# Patient Record
Sex: Female | Born: 1935
Health system: Southern US, Community
[De-identification: ages and names within clinical notes are randomized; demographics above are authoritative.]

## PROBLEM LIST (undated history)

## (undated) DIAGNOSIS — K219 Gastro-esophageal reflux disease without esophagitis: Secondary | ICD-10-CM

## (undated) DIAGNOSIS — E785 Hyperlipidemia, unspecified: Secondary | ICD-10-CM

## (undated) DIAGNOSIS — H269 Unspecified cataract: Secondary | ICD-10-CM

## (undated) DIAGNOSIS — F32A Depression, unspecified: Secondary | ICD-10-CM

## (undated) DIAGNOSIS — F329 Major depressive disorder, single episode, unspecified: Secondary | ICD-10-CM

## (undated) DIAGNOSIS — I10 Essential (primary) hypertension: Secondary | ICD-10-CM

## (undated) DIAGNOSIS — R011 Cardiac murmur, unspecified: Secondary | ICD-10-CM

## (undated) DIAGNOSIS — I2699 Other pulmonary embolism without acute cor pulmonale: Secondary | ICD-10-CM

## (undated) DIAGNOSIS — I4891 Unspecified atrial fibrillation: Secondary | ICD-10-CM

## (undated) DIAGNOSIS — D751 Secondary polycythemia: Secondary | ICD-10-CM

## (undated) DIAGNOSIS — N390 Urinary tract infection, site not specified: Secondary | ICD-10-CM

## (undated) DIAGNOSIS — I459 Conduction disorder, unspecified: Secondary | ICD-10-CM

## (undated) DIAGNOSIS — D689 Coagulation defect, unspecified: Secondary | ICD-10-CM

## (undated) DIAGNOSIS — M1990 Unspecified osteoarthritis, unspecified site: Secondary | ICD-10-CM

## (undated) DIAGNOSIS — M810 Age-related osteoporosis without current pathological fracture: Secondary | ICD-10-CM

## (undated) DIAGNOSIS — I82409 Acute embolism and thrombosis of unspecified deep veins of unspecified lower extremity: Secondary | ICD-10-CM

## (undated) DIAGNOSIS — F0390 Unspecified dementia without behavioral disturbance: Secondary | ICD-10-CM

## (undated) DIAGNOSIS — E039 Hypothyroidism, unspecified: Secondary | ICD-10-CM

## (undated) DIAGNOSIS — K449 Diaphragmatic hernia without obstruction or gangrene: Secondary | ICD-10-CM

## (undated) HISTORY — DX: Diaphragmatic hernia without obstruction or gangrene: K44.9

## (undated) HISTORY — PX: INNER EAR SURGERY: SHX679

## (undated) HISTORY — DX: Major depressive disorder, single episode, unspecified: F32.9

## (undated) HISTORY — PX: HEMORRHOID SURGERY: SHX153

## (undated) HISTORY — DX: Other pulmonary embolism without acute cor pulmonale: I26.99

## (undated) HISTORY — DX: Unspecified cataract: H26.9

## (undated) HISTORY — PX: JOINT REPLACEMENT: SHX530

## (undated) HISTORY — DX: Gastro-esophageal reflux disease without esophagitis: K21.9

## (undated) HISTORY — DX: Urinary tract infection, site not specified: N39.0

## (undated) HISTORY — DX: Unspecified dementia, unspecified severity, without behavioral disturbance, psychotic disturbance, mood disturbance, and anxiety: F03.90

## (undated) HISTORY — DX: Essential (primary) hypertension: I10

## (undated) HISTORY — DX: Age-related osteoporosis without current pathological fracture: M81.0

## (undated) HISTORY — DX: Secondary polycythemia: D75.1

## (undated) HISTORY — PX: OTHER SURGICAL HISTORY: SHX169

## (undated) HISTORY — DX: Depression, unspecified: F32.A

## (undated) HISTORY — DX: Unspecified osteoarthritis, unspecified site: M19.90

## (undated) HISTORY — DX: Acute embolism and thrombosis of unspecified deep veins of unspecified lower extremity: I82.409

## (undated) HISTORY — PX: BLADDER SURGERY: SHX569

## (undated) HISTORY — PX: TOTAL KNEE ARTHROPLASTY: SHX125

## (undated) HISTORY — DX: Hyperlipidemia, unspecified: E78.5

## (undated) HISTORY — DX: Cardiac murmur, unspecified: R01.1

## (undated) HISTORY — DX: Unspecified atrial fibrillation: I48.91

## (undated) HISTORY — PX: COLONOSCOPY: SHX174

## (undated) HISTORY — DX: Conduction disorder, unspecified: I45.9

## (undated) HISTORY — DX: Hypothyroidism, unspecified: E03.9

## (undated) HISTORY — DX: Coagulation defect, unspecified: D68.9

## (undated) HISTORY — PX: ESOPHAGOGASTRODUODENOSCOPY: SHX1529

---

## 1997-09-30 ENCOUNTER — Other Ambulatory Visit: Admission: RE | Admit: 1997-09-30 | Discharge: 1997-09-30 | Payer: Self-pay | Admitting: Family Medicine

## 1997-10-04 ENCOUNTER — Other Ambulatory Visit: Admission: RE | Admit: 1997-10-04 | Discharge: 1997-10-04 | Payer: Self-pay | Admitting: Family Medicine

## 1997-12-02 ENCOUNTER — Other Ambulatory Visit: Admission: RE | Admit: 1997-12-02 | Discharge: 1997-12-02 | Payer: Self-pay | Admitting: Family Medicine

## 1999-02-14 ENCOUNTER — Other Ambulatory Visit: Admission: RE | Admit: 1999-02-14 | Discharge: 1999-02-14 | Payer: Self-pay | Admitting: Family Medicine

## 2000-02-15 ENCOUNTER — Other Ambulatory Visit: Admission: RE | Admit: 2000-02-15 | Discharge: 2000-02-15 | Payer: Self-pay | Admitting: Family Medicine

## 2002-07-02 ENCOUNTER — Other Ambulatory Visit: Admission: RE | Admit: 2002-07-02 | Discharge: 2002-07-02 | Payer: Self-pay | Admitting: Family Medicine

## 2002-07-07 ENCOUNTER — Encounter: Payer: Self-pay | Admitting: Family Medicine

## 2002-07-07 ENCOUNTER — Encounter: Admission: RE | Admit: 2002-07-07 | Discharge: 2002-07-07 | Payer: Self-pay | Admitting: Family Medicine

## 2002-07-13 ENCOUNTER — Ambulatory Visit (HOSPITAL_COMMUNITY): Admission: RE | Admit: 2002-07-13 | Discharge: 2002-07-13 | Payer: Self-pay | Admitting: Cardiology

## 2002-08-28 ENCOUNTER — Encounter: Payer: Self-pay | Admitting: Internal Medicine

## 2002-08-28 ENCOUNTER — Encounter: Admission: RE | Admit: 2002-08-28 | Discharge: 2002-08-28 | Payer: Self-pay | Admitting: Otolaryngology

## 2002-08-28 ENCOUNTER — Encounter: Payer: Self-pay | Admitting: Otolaryngology

## 2003-03-04 ENCOUNTER — Encounter: Admission: RE | Admit: 2003-03-04 | Discharge: 2003-03-04 | Payer: Self-pay | Admitting: Urology

## 2003-03-04 ENCOUNTER — Encounter: Payer: Self-pay | Admitting: Urology

## 2003-06-28 ENCOUNTER — Ambulatory Visit (HOSPITAL_COMMUNITY): Admission: RE | Admit: 2003-06-28 | Discharge: 2003-06-28 | Payer: Self-pay | Admitting: Urology

## 2004-06-21 ENCOUNTER — Inpatient Hospital Stay (HOSPITAL_COMMUNITY): Admission: RE | Admit: 2004-06-21 | Discharge: 2004-06-26 | Payer: Self-pay | Admitting: Orthopaedic Surgery

## 2004-06-26 ENCOUNTER — Ambulatory Visit: Payer: Self-pay | Admitting: Physical Medicine & Rehabilitation

## 2004-06-26 ENCOUNTER — Inpatient Hospital Stay
Admission: RE | Admit: 2004-06-26 | Discharge: 2004-06-30 | Payer: Self-pay | Admitting: Physical Medicine & Rehabilitation

## 2005-03-30 ENCOUNTER — Encounter: Admission: RE | Admit: 2005-03-30 | Discharge: 2005-03-30 | Payer: Self-pay | Admitting: Family Medicine

## 2006-04-01 ENCOUNTER — Ambulatory Visit: Payer: Self-pay | Admitting: Internal Medicine

## 2006-04-23 ENCOUNTER — Encounter (INDEPENDENT_AMBULATORY_CARE_PROVIDER_SITE_OTHER): Payer: Self-pay | Admitting: Specialist

## 2006-04-23 ENCOUNTER — Ambulatory Visit: Payer: Self-pay | Admitting: Internal Medicine

## 2007-07-31 ENCOUNTER — Encounter: Admission: RE | Admit: 2007-07-31 | Discharge: 2007-07-31 | Payer: Self-pay | Admitting: Cardiology

## 2007-09-24 ENCOUNTER — Encounter: Admission: RE | Admit: 2007-09-24 | Discharge: 2007-09-24 | Payer: Self-pay | Admitting: Family Medicine

## 2007-11-19 ENCOUNTER — Encounter: Admission: RE | Admit: 2007-11-19 | Discharge: 2008-02-05 | Payer: Self-pay | Admitting: Family Medicine

## 2008-08-16 ENCOUNTER — Encounter: Admission: RE | Admit: 2008-08-16 | Discharge: 2008-08-16 | Payer: Self-pay | Admitting: Neurology

## 2008-12-07 ENCOUNTER — Encounter: Admission: RE | Admit: 2008-12-07 | Discharge: 2008-12-07 | Payer: Self-pay | Admitting: Neurology

## 2008-12-14 ENCOUNTER — Ambulatory Visit: Payer: Self-pay | Admitting: Internal Medicine

## 2008-12-14 DIAGNOSIS — R1319 Other dysphagia: Secondary | ICD-10-CM | POA: Insufficient documentation

## 2008-12-16 ENCOUNTER — Ambulatory Visit (HOSPITAL_COMMUNITY): Admission: RE | Admit: 2008-12-16 | Discharge: 2008-12-16 | Payer: Self-pay | Admitting: Internal Medicine

## 2008-12-23 ENCOUNTER — Ambulatory Visit: Payer: Self-pay | Admitting: Internal Medicine

## 2008-12-30 ENCOUNTER — Ambulatory Visit: Payer: Self-pay | Admitting: Internal Medicine

## 2009-01-21 ENCOUNTER — Ambulatory Visit (HOSPITAL_COMMUNITY): Admission: RE | Admit: 2009-01-21 | Discharge: 2009-01-21 | Payer: Self-pay | Admitting: Neurology

## 2009-05-14 DIAGNOSIS — I2699 Other pulmonary embolism without acute cor pulmonale: Secondary | ICD-10-CM

## 2009-05-14 DIAGNOSIS — I82409 Acute embolism and thrombosis of unspecified deep veins of unspecified lower extremity: Secondary | ICD-10-CM

## 2009-05-14 HISTORY — DX: Other pulmonary embolism without acute cor pulmonale: I26.99

## 2009-05-14 HISTORY — DX: Acute embolism and thrombosis of unspecified deep veins of unspecified lower extremity: I82.409

## 2009-07-21 ENCOUNTER — Inpatient Hospital Stay (HOSPITAL_COMMUNITY): Admission: EM | Admit: 2009-07-21 | Discharge: 2009-07-27 | Payer: Self-pay | Admitting: Emergency Medicine

## 2009-07-25 ENCOUNTER — Encounter (INDEPENDENT_AMBULATORY_CARE_PROVIDER_SITE_OTHER): Payer: Self-pay | Admitting: Internal Medicine

## 2009-08-19 ENCOUNTER — Emergency Department (HOSPITAL_COMMUNITY): Admission: EM | Admit: 2009-08-19 | Discharge: 2009-08-19 | Payer: Self-pay | Admitting: Emergency Medicine

## 2009-08-24 ENCOUNTER — Encounter (INDEPENDENT_AMBULATORY_CARE_PROVIDER_SITE_OTHER): Payer: Self-pay | Admitting: Emergency Medicine

## 2009-08-24 ENCOUNTER — Ambulatory Visit: Payer: Self-pay | Admitting: Surgery

## 2009-08-24 ENCOUNTER — Inpatient Hospital Stay (HOSPITAL_COMMUNITY): Admission: EM | Admit: 2009-08-24 | Discharge: 2009-08-31 | Payer: Self-pay | Admitting: Emergency Medicine

## 2009-08-25 ENCOUNTER — Encounter (INDEPENDENT_AMBULATORY_CARE_PROVIDER_SITE_OTHER): Payer: Self-pay | Admitting: Internal Medicine

## 2009-11-01 ENCOUNTER — Emergency Department (HOSPITAL_COMMUNITY): Admission: EM | Admit: 2009-11-01 | Discharge: 2009-11-01 | Payer: Self-pay | Admitting: Emergency Medicine

## 2010-02-01 ENCOUNTER — Ambulatory Visit: Payer: Self-pay | Admitting: Cardiology

## 2010-02-01 ENCOUNTER — Encounter: Payer: Self-pay | Admitting: Internal Medicine

## 2010-02-08 ENCOUNTER — Ambulatory Visit (HOSPITAL_COMMUNITY): Admission: RE | Admit: 2010-02-08 | Discharge: 2010-02-08 | Payer: Self-pay | Admitting: Geriatric Medicine

## 2010-02-15 ENCOUNTER — Ambulatory Visit: Payer: Self-pay | Admitting: Cardiology

## 2010-02-27 ENCOUNTER — Ambulatory Visit: Payer: Self-pay | Admitting: Cardiology

## 2010-03-02 ENCOUNTER — Ambulatory Visit: Payer: Self-pay | Admitting: Internal Medicine

## 2010-03-08 ENCOUNTER — Encounter: Payer: Self-pay | Admitting: Internal Medicine

## 2010-03-13 ENCOUNTER — Ambulatory Visit: Payer: Self-pay | Admitting: Cardiology

## 2010-03-27 ENCOUNTER — Ambulatory Visit: Payer: Self-pay | Admitting: Cardiology

## 2010-04-03 ENCOUNTER — Ambulatory Visit: Payer: Self-pay | Admitting: Cardiology

## 2010-04-07 ENCOUNTER — Ambulatory Visit: Payer: Self-pay | Admitting: Cardiology

## 2010-04-12 ENCOUNTER — Ambulatory Visit: Payer: Self-pay | Admitting: Internal Medicine

## 2010-04-12 ENCOUNTER — Encounter: Payer: Self-pay | Admitting: Cardiovascular Disease

## 2010-04-12 DIAGNOSIS — I442 Atrioventricular block, complete: Secondary | ICD-10-CM | POA: Insufficient documentation

## 2010-04-14 ENCOUNTER — Ambulatory Visit: Payer: Self-pay | Admitting: Cardiology

## 2010-04-18 ENCOUNTER — Ambulatory Visit (HOSPITAL_COMMUNITY)
Admission: RE | Admit: 2010-04-18 | Discharge: 2010-04-18 | Payer: Self-pay | Source: Home / Self Care | Admitting: Internal Medicine

## 2010-04-21 ENCOUNTER — Ambulatory Visit: Payer: Self-pay | Admitting: Cardiology

## 2010-04-27 ENCOUNTER — Encounter: Payer: Self-pay | Admitting: Internal Medicine

## 2010-04-27 ENCOUNTER — Ambulatory Visit: Payer: Self-pay

## 2010-04-28 ENCOUNTER — Ambulatory Visit: Payer: Self-pay | Admitting: Cardiology

## 2010-05-01 ENCOUNTER — Ambulatory Visit: Payer: Self-pay | Admitting: Cardiology

## 2010-05-09 ENCOUNTER — Ambulatory Visit: Payer: Self-pay | Admitting: Cardiology

## 2010-06-04 ENCOUNTER — Encounter: Payer: Self-pay | Admitting: Geriatric Medicine

## 2010-06-11 LAB — CONVERTED CEMR LAB
BUN: 29 mg/dL — ABNORMAL HIGH (ref 6–23)
Basophils Absolute: 0 10*3/uL (ref 0.0–0.1)
Basophils Relative: 0.4 % (ref 0.0–3.0)
CO2: 29 meq/L (ref 19–32)
Calcium: 9.4 mg/dL (ref 8.4–10.5)
Chloride: 106 meq/L (ref 96–112)
Creatinine, Ser: 1.1 mg/dL (ref 0.4–1.2)
Eosinophils Absolute: 0.1 10*3/uL (ref 0.0–0.7)
Eosinophils Relative: 0.7 % (ref 0.0–5.0)
GFR calc non Af Amer: 51.81 mL/min (ref >60)
Glucose, Bld: 79 mg/dL (ref 70–99)
HCT: 42.4 % (ref 36.0–46.0)
Hemoglobin: 14.6 g/dL (ref 12.0–15.0)
INR: 3.9 — ABNORMAL HIGH (ref 0.8–1.0)
Lymphocytes Relative: 51 % — ABNORMAL HIGH (ref 12.0–46.0)
Lymphs Abs: 4.6 10*3/uL — ABNORMAL HIGH (ref 0.7–4.0)
MCHC: 34.6 g/dL (ref 30.0–36.0)
MCV: 96.6 fL (ref 78.0–100.0)
Monocytes Absolute: 0.6 10*3/uL (ref 0.1–1.0)
Monocytes Relative: 6.3 % (ref 3.0–12.0)
Neutro Abs: 3.8 10*3/uL (ref 1.4–7.7)
Neutrophils Relative %: 41.6 % — ABNORMAL LOW (ref 43.0–77.0)
Platelets: 146 10*3/uL — ABNORMAL LOW (ref 150.0–400.0)
Potassium: 5 meq/L (ref 3.5–5.1)
Prothrombin Time: 41.4 s — ABNORMAL HIGH (ref 9.7–11.8)
RBC: 4.39 M/uL (ref 3.87–5.11)
RDW: 14.5 % (ref 11.5–14.6)
Sodium: 144 meq/L (ref 135–145)
WBC: 9.1 10*3/uL (ref 4.5–10.5)
aPTT: 47 s — ABNORMAL HIGH (ref 21.7–28.8)

## 2010-06-15 NOTE — Procedures (Signed)
Summary: wound check.mdt.amber   Current Medications (verified): 1)  Citalopram Hydrobromide 10 Mg Tabs (Citalopram Hydrobromide) .... Once Daily 2)  Pravachol 20 Mg Tabs (Pravastatin Sodium) .... Take 1 Tablet By Mouth Once A Day 3)  Levothyroxine Sodium 75 Mcg Tabs (Levothyroxine Sodium) .... Once Daily 4)  Calcium Carbonate 600 Mg Tabs (Calcium Carbonate) .... Take 1 Tablet By Mouth Two Times A Day 5)  Divalproex Sodium 125 Mg Tbec (Divalproex Sodium) .... 3 Daily 6)  Fosamax 70 Mg Tabs (Alendronate Sodium) .[name].. 1 Tab Q Week 7)  Vesicare 5 Mg Tabs (Solifenacin Succinate) .... 1/2 Tab Daily 8)  Vitamin D 1000 Unit Tabs (Cholecalciferol) .... Uad 9)  Donepezil Hcl 10 Mg Tabs (Donepezil Hcl) .... Once Daily 10)  Fish Oil   Oil (Fish Oil) .... Daily 11)  Potassium Chloride Crys Cr 20 Meq Cr-Tabs (Potassium Chloride Crys Cr) .... Take One Tablet By Mouth Daily 12)  Namenda 5 Mg Tabs (Memantine Hcl) .... Once Daily 13)  Omeprazole 20 Mg Cpdr (Omeprazole) .... Once Daily 14)  Salsalate 750 Mg Tabs (Salsalate) .... 2 Daily 15)  Warfarin Sodium 5 Mg Tabs (Warfarin Sodium) .... Use As Directed By Anticoagulation Clinic 16)  Depakote 250 Mg Tbec (Divalproex Sodium) .[name].. 1 By Mouth Two Times A Day  Allergies (verified): No Known Drug Allergies  PPM Specifications Following MD:  Hillis Range, MD     Referring MD:  Rolla Plate PPM Vendor:  Medtronic     PPM Model Number:  [id number]     PPM Serial Number:  [id number]` PPM DOI:  04/18/2010     PPM Implanting MD:  Hillis Range, MD  Lead 1    Location: RA     DOI: 09/28/1996     Model #: [id number]     Serial #: [id number]     Status: active Lead 2    Location: RV     DOI: 09/28/1996     Model #: [id number]     Serial #: [id number]     Status: active  Magnet Response Rate:  BOL 85 ERI 65  Indications:  CHB   PPM Follow Up Remote Check?  No Battery Voltage:  2.80 V     Battery Est. Longevity:  11.5 yrs     Pacer Dependent:  Yes       PPM Device  Measurements Atrium  Amplitude: 1.40 mV, Impedance: 677 ohms, Threshold: 0.50 V at 0.40 msec Right Ventricle  Amplitude: PACED mV, Impedance: 872 ohms, Threshold: 1.25 V at 0.40 msec  Episodes MS Episodes:  1     Percent Mode Switch:  <0.1%     Coumadin:  Yes Ventricular High Rate:  0     Atrial Pacing:  62%     Ventricular Pacing:  99.9%  Parameters Mode:  DDDR     Lower Rate Limit:  65     Upper Rate Limit:  130 Paced AV Delay:  150     Sensed AV Delay:  120 Next Cardiology Appt Due:  08/10/2010 Tech Comments:  WOUND CHECK---STERI STRIPS REMOVED.  NO REDNESS OR SWELLING AT SITE.  NORMAL DEVICE FUNCTION.  CHANGED RA OUTPUT FROM 3.5 TO 2.00 V.  ROV 08-10-10 @ 945 W/JA. Vella Kohler  April 27, 2010 12:32 PM

## 2010-06-15 NOTE — Miscellaneous (Signed)
Summary: [facility]/PREVISIT/scm  Clinical Lists Changes  Observations: Added new observation of NKA: T (12/23/2008 12:39)

## 2010-06-15 NOTE — Procedures (Signed)
Summary: Colonoscopy: Diverticulosis   Colonoscopy  Procedure date:  04/23/2006  Findings:      Location:  [facility].  Results: Diverticulosis.   Results: Polyp. Benign Colonic Mucosa  Patient Name: Nicole, Watts MRN:  Procedure Procedures: Colonoscopy CPT: 757 [id number].    with polypectomy. CPT: A3573898.  Personnel: Endoscopist: Iva Boop, MD, [facility].  Referred By: Barbera Setters Artis Flock, MD.  Exam Location: Exam performed in Outpatient Clinic. Outpatient  Patient Consent: Procedure, Alternatives, Risks and Benefits discussed, consent obtained, from patient. Consent was obtained by the RN.  Indications Symptoms: Change in bowel habits.  History  Current Medications: Patient is not currently taking Coumadin.  Allergies: No known allergies.  Comments: WORSENING CONSTIPATION Pre-Exam Physical: Performed Apr 23, 2006. Cardio-pulmonary exam, Rectal exam, HEENT exam , Abdominal exam, Extremity exam, Mental status exam WNL.  Comments: Pt. history reviewed/updated, physical exam performed prior to initiation of sedation? yes Exam Exam: Extent of exam reached: Cecum, extent intended: Cecum.  The cecum was identified by appendiceal orifice and IC valve. Patient position: on left side. Time to Cecum: 00:04:49. Time for Withdrawl: 00:10:50. Images taken. ASA Classification: II. Tolerance: excellent.  Monitoring: Pulse and BP monitoring, Oximetry used. Supplemental O2 given.  Colon Prep Used MoviPrep for colon prep. Prep results: excellent.  Sedation Meds: Patient assessed and found to be appropriate for moderate (conscious) sedation. Residual sedation present from prior procedure today.  Versed 2 mg. given IV.  Findings - NORMAL EXAM: Ascending Colon to Descending Colon.  POLYP: Ascending Colon, Maximum size: 6 mm. sessile polyp. Procedure:  snare with cautery, removed, retrieved, Polyp sent to pathology. ICD9: Neoplasia, Benign, Large Bowel: 211.3.  -  DIVERTICULOSIS: Descending Colon to Sigmoid Colon. ICD9: Diverticulosis, Colon: 562.10. Comments: mild, no photo.  NORMAL EXAM: Cecum.  NORMAL EXAM: Rectum.   Assessment  Diagnoses: 211.3: Neoplasia, Benign, Large Bowel.  562.10: Diverticulosis, Colon.   Comments: 1) ONE 6 MM POLYP REMOVED 2) MILD DIVERTICULOSIS 3) EXCELLENT PREP  Events  Unplanned Interventions: No intervention was required.  Plans  Post Exam Instructions: Clear or full liquids: CLEARS UNTIL 330 PM, THEN SOFT. Resume previous diet: TOMORROW.  Patient Education: Patient given standard instructions for: Polyps. Diverticulosis. Constipation. Comments: CONTINUE FIBER AND PRUNES TAKE MIRALAX EACH DAY AS NEXT STEP IF STILL HAVING CONSTIPATION Disposition: After procedure patient sent to recovery. After recovery patient sent home.  Scheduling/Referral: Path Letter, to The Patient,   Comments: WOULD PROBABLY NOT SCHEDULE SURVEILLANCE COLONOSCOPY BUT AWAIT PATH SEE ME IF PERSISTENT SWALLOWING PROBLEMS OR CONSTIPATION  CC:   Bradd Canary, MD  This report was created from the original endoscopy report, which was reviewed and signed by the above listed endoscopist.

## 2010-06-15 NOTE — Assessment & Plan Note (Signed)
Summary: pacer check/mt   Visit Type:  Follow-up Primary Provider:  Thayer Headings MD   History of Present Illness: Ms Wolters is a pleasant 77 yo WF with a h/o dementia and complete heart block s/p PPM with most recent pulse generator replacement by Dr Deborah Chalk 2004 who presents today to establish EP care.  She reports significant fatigue and decreased exercise tolerance over the past few weeks.  Her family states that she has had noticable decline in exercise tolerance over the past few weeks.  She reports occasional sharp fleeting chest pains but denies exertional pain, SOB, orthopnea, PND, presyncope, or syncope.  Current Medications (verified): 1)  Citalopram Hydrobromide 10 Mg Tabs (Citalopram Hydrobromide) .... Once Daily 2)  Pravachol 20 Mg Tabs (Pravastatin Sodium) .... Take 1 Tablet By Mouth Once A Day 3)  Levothyroxine Sodium 75 Mcg Tabs (Levothyroxine Sodium) .... Once Daily 4)  Calcium Carbonate 600 Mg Tabs (Calcium Carbonate) .... Take 1 Tablet By Mouth Two Times A Day 5)  Divalproex Sodium 125 Mg Tbec (Divalproex Sodium) .... 3 Daily 6)  Fosamax 70 Mg Tabs (Alendronate Sodium) .[name].. 1 Tab Q Week 7)  Vesicare 5 Mg Tabs (Solifenacin Succinate) .... 1/2 Tab Daily 8)  Vitamin D 1000 Unit Tabs (Cholecalciferol) .... Uad 9)  Donepezil Hcl 10 Mg Tabs (Donepezil Hcl) .... Once Daily 10)  Fish Oil   Oil (Fish Oil) .... Daily 11)  Potassium Chloride Crys Cr 20 Meq Cr-Tabs (Potassium Chloride Crys Cr) .... Take One Tablet By Mouth Daily 12)  Namenda 5 Mg Tabs (Memantine Hcl) .... Once Daily 13)  Omeprazole 20 Mg Cpdr (Omeprazole) .... Once Daily 14)  Salsalate 750 Mg Tabs (Salsalate) .... 2 Daily 15)  Warfarin Sodium 5 Mg Tabs (Warfarin Sodium) .... Use As Directed By Anticoagulation Clinic  Allergies (verified): No Known Drug Allergies  Past History:  Past Medical History: Arthritis Depression Hyperlipidemia Hypothyroidism Complete HB s/p PPM (MDT) dementia prior DVT and  PTE 4/11 on coumadin hiatal hernia  Past Surgical History: Reviewed history from 12/13/2008 and no changes required. Hemorrhoidectomy Hysterectomy Pacemaker  Family History: Reviewed history from 12/14/2008 and no changes required. No fam HX Colon CA  Social History: Pt lives at home with spouse in [place] denies tobacco or ETOH  Review of Systems       All systems are reviewed and negative except as listed in the HPI.   Vital Signs:  Patient profile:   77 year old female Height:      60 inches Weight:      154 pounds BMI:     30.18 Pulse rate:   63 / minute BP sitting:   122 / 70  (left arm)  Vitals Entered By: Laurance Flatten CMA (April 12, 2010 11:13 AM)  Physical Exam  General:  mid dementia, elderly female  Head:  normocephalic and atraumatic Eyes:  PERRLA/EOM intact; conjunctiva and lids normal. Mouth:  Teeth, gums and palate normal. Oral mucosa normal. Neck:  supple Chest Wall:  pacemaker pocket is well healed Lungs:  Clear bilaterally to auscultation and percussion. Heart:  RRR(paced) Abdomen:  Bowel sounds positive; abdomen soft and non-tender without masses, organomegaly, or hernias noted. No hepatosplenomegaly. Msk:  walks slowly with a rolling walker Extremities:  No clubbing or cyanosis. Neurologic:  Alert and oriented x 3. Skin:  Intact without lesions or rashes.    PPM Follow Up Battery Voltage:  ERI V     Pacer Dependent:  Yes     Right Ventricle  Amplitude: PACED mV, Impedance: 887 ohms, Threshold: 1.0 V at 0.4 msec  Episodes MS Episodes:  0     Coumadin:  Yes Ventricular High Rate:  0     Ventricular Pacing:  99.9%  Parameters Mode:  VVI     Lower Rate Limit:  65     Tech Comments:  [facility]---DEVICE REACHED ERI ON 03-25-10 AND SWITCHED TO VVI 65.  PT FEELS BAD WITH SWITCH TO VVI 65. PT IS ON COUMADIN.  CHANGE OUT TO BE SCHEDULED. Vella Kohler  April 12, 2010 11:17 AM MD Comments:  agree  Impression &  Recommendations:  Problem # 1:  ATRIOVENTRICULAR BLOCK, 3RD DEGREE (ICD-426.0) The patient is s/p PPM for complete heart block.  She has reached ERI battery status and has converted to VVI backup pacing mode.  She is quite symptomatic with this.  I would therefore recommend pacemaker pulse generator replacement at the next available time.   Risks, benefits, alternatives to pacemaker pulse generator replacement were discussed in detail with the patient and her family today today.  They understands that the risks include but are not limited to bleeding, infection,  renal failure, MI, and stroke, death.  They accept these risks and wish to proceed.  We will therefore schedule pacemaker generator replacement at the next available time.  Other Orders: TLB-BMP (Basic Metabolic Panel-BMET) (80048-METABOL) TLB-CBC Platelet - w/Differential (85025-CBCD) TLB-PT (Protime) (85610-PTP) TLB-PTT (85730-PTTL)

## 2010-06-15 NOTE — Procedures (Signed)
Summary: Endoscopy   EGD  Procedure date:  12/30/2008  Findings:      Location: [facility]   ENDOSCOPY PROCEDURE REPORT  PATIENT:  Nicole, Watts  MR#:  [id number] BIRTHDATE:   Oct 04, 1922, 85 yrs. old   GENDER:   female  ENDOSCOPIST:   Iva Boop, MD, 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  The LB [id number]-H180 [id number] endoscope was introduced through the mouth and advanced to the second portion of the duodenum, without limitations.  The instrument was slowly withdrawn as the mucosa was carefully examined. <<PROCEDUREIMAGES>>    <<OLD IMAGES>>  A hiatal hernia was found in the cardia. It was 2 cm in size.  The examination was otherwise normal.    Dilation was then performed at the total esophagus.   1) Dilator:   Maloney   Size(s):   50 Fr Resistance:   minimal   Heme:   none     COMPLICATIONS:   None  ENDOSCOPIC IMPRESSION:  1) 2 cm hiatal hernia in the cardia  2) Otherwise normal examination 3) 50 Fr Maloney dilation performed due to dysphagia  RECOMMENDATIONS:  Clear liquids until 4PM then soft diet, then normal diet tomorrow.    Follow-up with Dr. Leone Payor as needed if swallowing difficulties fail to improve or return.    Iva Boop, MD, [facility]   CC: Shary Decamp, MD Kelli Hope, M.D.  The Patient

## 2010-06-15 NOTE — Letter (Signed)
Summary: Implantable Device Instructions  [employer] [employer], Main Office  [address]   [place], [place] [address]   Phone: [phone]  Fax: [phone]      Implantable Device Instructions  You are scheduled for:   _____ Generator Change  on 04/18/10 with Dr. Johney Frame.  1.  Please arrive at the Short Stay Center at [facility] at 10:30am on the day of your procedure.  2.  Do not eat or drink the night before your procedure.  3.  Complete lab work on 04/12/10  .  You do not have to be fasting.  4.   Take your last dose of Coumadin on--will call if you need to hold.  5.  Plan for an overnight stay.  Bring your insurance cards and a list of your medications.  6.  Wash your chest and neck with antibacterial soap (any brand) the evening before and the morning of your procedure.  Rinse well.   *If you have ANY questions after you get home, please call the office [phone]. Anselm Pancoast  *Every attempt is made to prevent procedures from being rescheduled.  Due to the nauture of Electrophysiology, rescheduling can happen.  The physician is always aware and directs the staff when this occurs.

## 2010-06-15 NOTE — Cardiovascular Report (Signed)
Summary: Office Visit   Office Visit   Imported By: Roderic Ovens 05/23/2010 14:09:50  _____________________________________________________________________  External Attachment:    Type:   Image     Comment:   External Document

## 2010-06-15 NOTE — Letter (Signed)
Summary: Remote Device Check  [employer], Main Office  [address]   [place], [place] [address]   Phone: [phone]  Fax: [phone]     March 08, 2010 MRN: [id number]   [name] [address] [place], [place]  [address]   Dear Ms. Deboer,   Your remote transmission was recieved and reviewed by your physician.  All diagnostics were within normal limits for you.   __X____Your next office visit is scheduled for:  04-12-10 @ 1100 with Dr Johney Frame.    Sincerely,  Vella Kohler

## 2010-06-15 NOTE — Letter (Signed)
Summary: [facility] - Office Visit  [facility] - Office Visit   Imported By: Marylou Mccoy 04/21/2010 17:20:15  _____________________________________________________________________  External Attachment:    Type:   Image     Comment:   External Document

## 2010-06-15 NOTE — Miscellaneous (Signed)
Summary: Device change out  Clinical Lists Changes  Observations: Added new observation of PPM INDICATN: CHB (04/27/2010 7:18) Added new observation of MAGNET RTE: BOL 85 ERI 65 (04/27/2010 7:18) Added new observation of PPMLEADSTAT2: active (04/27/2010 7:18) Added new observation of PPMLEADSER2: [id number]  (04/27/2010 7:18) Added new observation of PPMLEADMOD2: 4285  (04/27/2010 7:18) Added new observation of PPMLEADDOI2: 09/28/1996  (04/27/2010 7:18) Added new observation of PPMLEADLOC2: RV  (04/27/2010 7:18) Added new observation of PPMLEADSTAT1: active  (04/27/2010 7:18) Added new observation of PPMLEADSER1: 045409  (04/27/2010 7:18) Added new observation of PPMLEADMOD1: 4269  (04/27/2010 7:18) Added new observation of PPMLEADDOI1: 09/28/1996  (04/27/2010 7:18) Added new observation of PPMLEADLOC1: RA  (04/27/2010 7:18) Added new observation of PPM IMP MD: Hillis Range, MD  (04/27/2010 7:18) Added new observation of PPM DOI: 04/18/2010  (04/27/2010 7:18) Added new observation of PPM SERL#: [id number] H`  (04/27/2010 7:18) Added new observation of PPM MODL#: [id number]  (04/27/2010 9:56) Added new observation of PACEMAKERMFG: Medtronic  (04/27/2010 7:18) Added new observation of PPM REFER MD: Duffy Rhody Tennant,MD  (04/27/2010 7:18) Added new observation of PACEMAKER MD: Hillis Range, MD  (04/27/2010 7:18)      PPM Specifications Following MD:  Hillis Range, MD     Referring MD:  Rolla Plate PPM Vendor:  Medtronic     PPM Model Number:  ADDRL1     PPM Serial Number:  [id number] H` PPM DOI:  04/18/2010     PPM Implanting MD:  Hillis Range, MD  Lead 1    Location: RA     DOI: 09/28/1996     Model #: [id number]     Serial #: [id number]     Status: active Lead 2    Location: RV     DOI: 09/28/1996     Model #: [id number]     Serial #: [id number]     Status: active  Magnet Response Rate:  BOL 85 ERI 65  Indications:  CHB   PPM Follow Up Pacer Dependent:  Yes      Episodes Coumadin:   Yes  Parameters Mode:  VVI     Lower Rate Limit:  65

## 2010-06-15 NOTE — Cardiovascular Report (Signed)
Summary: Pre-Cath Orders  Pre-Cath Orders   Imported By: Marylou Mccoy 04/17/2010 15:27:22  _____________________________________________________________________  External Attachment:    Type:   Image     Comment:   External Document

## 2010-06-15 NOTE — Cardiovascular Report (Signed)
Summary: Office Visit Remote   Office Visit Remote   Imported By: Roderic Ovens 03/13/2010 16:23:55  _____________________________________________________________________  External Attachment:    Type:   Image     Comment:   External Document

## 2010-06-15 NOTE — Assessment & Plan Note (Signed)
Summary: DYSPHAGIA   History of Present Illness Visit Type: Follow-up Visit Primary GI MD: Stan Head MD [facility] Primary Provider: Thayer Headings MD Chief Complaint: dysphagia History of Present Illness:   Having dysphagia just about daily, similar to 2007 but not as severe. "Food is very dry and does not want to go either way" No globus  Descrbes upper sternal sticking point helped by drinking water/liquid.   GI Review of Systems    Reports acid reflux and  dysphagia with solids.      Denies abdominal pain, belching, bloating, chest pain, dysphagia with liquids, heartburn, loss of appetite, nausea, vomiting, vomiting blood, weight loss, and  weight gain.        Denies anal fissure, black tarry stools, change in bowel habit, constipation, diarrhea, diverticulosis, fecal incontinence, heme positive stool, hemorrhoids, irritable bowel syndrome, jaundice, light color stool, liver problems, rectal bleeding, and  rectal pain.    Current Medications (verified): 1)  Premarin 0.3 Mg Tabs (Estrogens Conjugated) .... Take 1 Tablet By Mouth Once A Day 2)  Prozac 20 Mg Caps (Fluoxetine Hcl) .... Take 1 Tablet By Mouth Once A Day 3)  Pravachol 20 Mg Tabs (Pravastatin Sodium) .... Take 1 Tablet By Mouth Once A Day 4)  Atenolol 25 Mg Tabs (Atenolol) .... Take 1 Tab By Mouth At Bedtime 5)  Aspir-Low 81 Mg Tbec (Aspirin) .... Take 1 Tablet By Mouth Once A Day 6)  Levothroid 50 Mcg Tabs (Levothyroxine Sodium) .... Take 1 Tablet By Mouth Once A Day 7)  Amitiza 24 Mcg Caps (Lubiprostone) .... Take 1 Capsule By Mouth Once A Day 8)  Calcium Carbonate 600 Mg Tabs (Calcium Carbonate) .... Take 1 Tablet By Mouth Two Times A Day 9)  Wellbutrin Xl 150 Mg Xr24h-Tab (Bupropion Hcl) .... Take 1 Tablet By Mouth Once A Day 10)  Advil Pm 200-38 Mg Tabs (Ibuprofen-Diphenhydramine Cit) .... Take 1 Tab By Mouth At Bedtime 11)  Divalproex Sodium 250 Mg Tbec (Divalproex Sodium) .[name].. 1 Tab Qd 12)  Fosamax 70 Mg Tabs  (Alendronate Sodium) .[name].. 1 Tab Q Week 13)  Depakote 500 Mg Tbec (Divalproex Sodium) .[name].. 1 Tab Q Pm  Allergies (verified): No Known Drug Allergies  Past History:  Past Medical History: Last updated: 12/13/2008 Arthritis Depression Hyperlipidemia Hypothyroidism  Past Surgical History: Last updated: 12/13/2008 Hemorrhoidectomy Hysterectomy Pacemaker  Family History: No fam HX Colon CA  Social History: Reviewed history and no changes required.  Vital Signs:  Patient profile:   77 year old female Height:      60 inches Weight:      155.0 pounds Pulse rate:   72 / minute Pulse rhythm:   regular BP sitting:   110 / 68  (left arm)  Vitals Entered By: Darryl Lent CMA Duncan Dull) (December 14, 2008 11:42 AM)  Physical Exam  General:  Well developed, well nourished, no acute distress. Neck:  Supple; no masses or thyromegaly. Lungs:  Clear throughout to auscultation. Heart:  Regular rate and rhythm; no murmurs, rubs,  or bruits. Cervical Nodes:  No significant cervical or supraclavicular adenopathy.  Psych:  Alert and cooperative. Normal mood and affect.   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 Undergoing eurologic evaluation for tremors/shakes, walking difficulty and spasticity. Etilology not determined yet by Dr. Thad Ranger. This has been going on since 2009.  Patient Instructions: 1)  Your Barium Swallow is scheduled for Thursday, December 16, 2008. 2)  We will call you with the results of your Barium Swallow. 3)  Copy sent to : Thayer Headings, MD, Kelli Hope, MD 4)  The medication list was reviewed and reconciled.  All changed / newly prescribed medications were explained.  A complete medication list was provided to the patient / caregiver.  Appended Document:  Orders Update Clinical Lists Changes  Orders: Added new Test order of Barium Swallow with Tablet (BS w/tab) - Signed

## 2010-06-15 NOTE — Procedures (Signed)
Summary: EGD: Normal    EGD  Procedure date:  04/23/2006  Findings:      Findings: Normal  Location: [facility]   Patient Name: [name], [name] MRN:  Procedure Procedures: Panendoscopy (EGD) CPT: 43235.    with [facility] Dilation of Esophagus Personnel: Endoscopist: Iva Boop, MD, [facility].  Referred By: Barbera Setters Artis Flock, MD.  Exam Location: Exam performed in Outpatient Clinic. Outpatient  Patient Consent: Procedure, Alternatives, Risks and Benefits discussed, consent obtained, from patient. Consent was obtained by the RN.  Indications Symptoms: Dysphagia.  History  Current Medications: Patient is not currently taking Coumadin.  Allergies: No known allergies.  Pre-Exam Physical: Performed Apr 23, 2006  Cardio-pulmonary exam, HEENT exam, Abdominal exam, Extremity exam, Mental status exam WNL.  Comments: Pt. history reviewed/updated, physical exam performed prior to initiation of sedation? YES Exam Exam Info: Maximum depth of insertion Duodenum, intended Duodenum. Patient position: on left side. Gastric retroflexion performed. Images taken. ASA Classification: II. Tolerance: excellent.  Sedation Meds: Patient assessed and found to be appropriate for moderate (conscious) sedation. Fentanyl 50 mcg. given IV. Versed 3 mg. given IV. Cetacaine Spray 2 sprays given aerosolized.  Monitoring: BP and pulse monitoring done. Oximetry used. Supplemental O2 given  Findings - Normal: Proximal Esophagus to Duodenal 2nd Portion. Comments: z- line at 35 cm, patulous GE junction, some non-specific erythema in antrum, ? from colon prep.  Dilation: Distal Esophagus. for dysphagia without stricture. Maloney dilator used, Diameter: 54 F, No Resistance, No Heme present on extraction. 1  total dilators used. Patient tolerance excellent.   Assessment  Comments: LOOKS NORMAL 54 FR MALONEY PASSED DUE TO HISTORY OF DYSPHAGIA Events  Unplanned Intervention: No  unplanned interventions were required.  Plans Medication(s): Continue current medications.  Disposition: After procedure patient sent to recovery. After recovery patient sent home.  Scheduling: Colonoscopy, NEXT   CC:   Bradd Canary, MD  This report was created from the original endoscopy report, which was reviewed and signed by the above listed endoscopist.

## 2010-06-16 NOTE — Cardiovascular Report (Signed)
Summary: Office Visit   Office Visit   Imported By: Roderic Ovens 04/18/2010 10:43:35  _____________________________________________________________________  External Attachment:    Type:   Image     Comment:   External Document

## 2010-07-24 LAB — PROTIME-INR
INR: 1.12 (ref 0.00–1.49)
Prothrombin Time: 14.6 seconds (ref 11.6–15.2)

## 2010-07-24 LAB — SURGICAL PCR SCREEN
MRSA, PCR: NEGATIVE
Staphylococcus aureus: NEGATIVE

## 2010-07-26 ENCOUNTER — Encounter (INDEPENDENT_AMBULATORY_CARE_PROVIDER_SITE_OTHER): Payer: Medicare Other | Admitting: Internal Medicine

## 2010-07-26 ENCOUNTER — Encounter: Payer: Self-pay | Admitting: Internal Medicine

## 2010-07-26 DIAGNOSIS — I4891 Unspecified atrial fibrillation: Secondary | ICD-10-CM | POA: Insufficient documentation

## 2010-07-26 DIAGNOSIS — I442 Atrioventricular block, complete: Secondary | ICD-10-CM

## 2010-08-01 LAB — BASIC METABOLIC PANEL
BUN: 11 mg/dL (ref 6–23)
BUN: 3 mg/dL — ABNORMAL LOW (ref 6–23)
BUN: 7 mg/dL (ref 6–23)
BUN: 7 mg/dL (ref 6–23)
CO2: 24 mEq/L (ref 19–32)
CO2: 25 mEq/L (ref 19–32)
CO2: 26 mEq/L (ref 19–32)
CO2: 27 mEq/L (ref 19–32)
Calcium: 7.6 mg/dL — ABNORMAL LOW (ref 8.4–10.5)
Calcium: 7.8 mg/dL — ABNORMAL LOW (ref 8.4–10.5)
Calcium: 7.8 mg/dL — ABNORMAL LOW (ref 8.4–10.5)
Calcium: 8.2 mg/dL — ABNORMAL LOW (ref 8.4–10.5)
Chloride: 111 mEq/L (ref 96–112)
Chloride: 111 mEq/L (ref 96–112)
Chloride: 113 mEq/L — ABNORMAL HIGH (ref 96–112)
Chloride: 113 mEq/L — ABNORMAL HIGH (ref 96–112)
Creatinine, Ser: 0.7 mg/dL (ref 0.4–1.2)
Creatinine, Ser: 0.77 mg/dL (ref 0.4–1.2)
Creatinine, Ser: 0.78 mg/dL (ref 0.4–1.2)
Creatinine, Ser: 0.81 mg/dL (ref 0.4–1.2)
GFR calc Af Amer: >60 mL/min (ref >60)
GFR calc Af Amer: >60 mL/min (ref >60)
GFR calc Af Amer: >60 mL/min (ref >60)
GFR calc Af Amer: >60 mL/min (ref >60)
GFR calc non Af Amer: >60 mL/min (ref >60)
GFR calc non Af Amer: >60 mL/min (ref >60)
GFR calc non Af Amer: >60 mL/min (ref >60)
GFR calc non Af Amer: >60 mL/min (ref >60)
Glucose, Bld: 87 mg/dL (ref 70–99)
Glucose, Bld: 87 mg/dL (ref 70–99)
Glucose, Bld: 91 mg/dL (ref 70–99)
Glucose, Bld: 95 mg/dL (ref 70–99)
Potassium: 3 mEq/L — ABNORMAL LOW (ref 3.5–5.1)
Potassium: 3.5 mEq/L (ref 3.5–5.1)
Potassium: 3.8 mEq/L (ref 3.5–5.1)
Potassium: 3.9 mEq/L (ref 3.5–5.1)
Sodium: 139 mEq/L (ref 135–145)
Sodium: 142 mEq/L (ref 135–145)
Sodium: 142 mEq/L (ref 135–145)
Sodium: 145 mEq/L (ref 135–145)

## 2010-08-01 LAB — HEPARIN LEVEL (UNFRACTIONATED)
Heparin Unfractionated: 0.25 IU/mL — ABNORMAL LOW (ref 0.30–0.70)
Heparin Unfractionated: 0.39 IU/mL (ref 0.30–0.70)
Heparin Unfractionated: 0.46 IU/mL (ref 0.30–0.70)
Heparin Unfractionated: 0.49 IU/mL (ref 0.30–0.70)
Heparin Unfractionated: 0.63 IU/mL (ref 0.30–0.70)
Heparin Unfractionated: 0.73 IU/mL — ABNORMAL HIGH (ref 0.30–0.70)

## 2010-08-01 LAB — CBC
HCT: 30.4 % — ABNORMAL LOW (ref 36.0–46.0)
HCT: 30.6 % — ABNORMAL LOW (ref 36.0–46.0)
HCT: 30.6 % — ABNORMAL LOW (ref 36.0–46.0)
HCT: 30.8 % — ABNORMAL LOW (ref 36.0–46.0)
HCT: 32 % — ABNORMAL LOW (ref 36.0–46.0)
HCT: 32.6 % — ABNORMAL LOW (ref 36.0–46.0)
Hemoglobin: 10.2 g/dL — ABNORMAL LOW (ref 12.0–15.0)
Hemoglobin: 10.2 g/dL — ABNORMAL LOW (ref 12.0–15.0)
Hemoglobin: 10.4 g/dL — ABNORMAL LOW (ref 12.0–15.0)
Hemoglobin: 10.4 g/dL — ABNORMAL LOW (ref 12.0–15.0)
Hemoglobin: 10.9 g/dL — ABNORMAL LOW (ref 12.0–15.0)
Hemoglobin: 11.1 g/dL — ABNORMAL LOW (ref 12.0–15.0)
MCHC: 33.2 g/dL (ref 30.0–36.0)
MCHC: 33.7 g/dL (ref 30.0–36.0)
MCHC: 33.7 g/dL (ref 30.0–36.0)
MCHC: 33.8 g/dL (ref 30.0–36.0)
MCHC: 33.9 g/dL (ref 30.0–36.0)
MCHC: 34 g/dL (ref 30.0–36.0)
MCV: 100.2 fL — ABNORMAL HIGH (ref 78.0–100.0)
MCV: 100.4 fL — ABNORMAL HIGH (ref 78.0–100.0)
MCV: 100.5 fL — ABNORMAL HIGH (ref 78.0–100.0)
MCV: 101 fL — ABNORMAL HIGH (ref 78.0–100.0)
MCV: 101.3 fL — ABNORMAL HIGH (ref 78.0–100.0)
MCV: 101.6 fL — ABNORMAL HIGH (ref 78.0–100.0)
Platelets: 163 10*3/uL (ref 150–400)
Platelets: 178 10*3/uL (ref 150–400)
Platelets: 193 10*3/uL (ref 150–400)
Platelets: 209 10*3/uL (ref 150–400)
Platelets: 226 10*3/uL (ref 150–400)
Platelets: 236 10*3/uL (ref 150–400)
RBC: 3.02 MIL/uL — ABNORMAL LOW (ref 3.87–5.11)
RBC: 3.03 MIL/uL — ABNORMAL LOW (ref 3.87–5.11)
RBC: 3.05 MIL/uL — ABNORMAL LOW (ref 3.87–5.11)
RBC: 3.05 MIL/uL — ABNORMAL LOW (ref 3.87–5.11)
RBC: 3.16 MIL/uL — ABNORMAL LOW (ref 3.87–5.11)
RBC: 3.25 MIL/uL — ABNORMAL LOW (ref 3.87–5.11)
RDW: 13.9 % (ref 11.5–15.5)
RDW: 13.9 % (ref 11.5–15.5)
RDW: 14.1 % (ref 11.5–15.5)
RDW: 14.2 % (ref 11.5–15.5)
RDW: 14.2 % (ref 11.5–15.5)
RDW: 14.3 % (ref 11.5–15.5)
WBC: 5.4 10*3/uL (ref 4.0–10.5)
WBC: 6 10*3/uL (ref 4.0–10.5)
WBC: 6 10*3/uL (ref 4.0–10.5)
WBC: 6.3 10*3/uL (ref 4.0–10.5)
WBC: 6.4 10*3/uL (ref 4.0–10.5)
WBC: 7.5 10*3/uL (ref 4.0–10.5)

## 2010-08-01 LAB — PROTIME-INR
INR: 1.18 (ref 0.00–1.49)
INR: 1.46 (ref 0.00–1.49)
INR: 1.87 — ABNORMAL HIGH (ref 0.00–1.49)
INR: 2.61 — ABNORMAL HIGH (ref 0.00–1.49)
INR: 3.43 — ABNORMAL HIGH (ref 0.00–1.49)
INR: 3.83 — ABNORMAL HIGH (ref 0.00–1.49)
Prothrombin Time: 14.9 seconds (ref 11.6–15.2)
Prothrombin Time: 17.6 seconds — ABNORMAL HIGH (ref 11.6–15.2)
Prothrombin Time: 21.4 seconds — ABNORMAL HIGH (ref 11.6–15.2)
Prothrombin Time: 27.7 seconds — ABNORMAL HIGH (ref 11.6–15.2)
Prothrombin Time: 34.3 seconds — ABNORMAL HIGH (ref 11.6–15.2)
Prothrombin Time: 37.4 seconds — ABNORMAL HIGH (ref 11.6–15.2)

## 2010-08-01 LAB — URINE CULTURE
Colony Count: 80000
Special Requests: NEGATIVE

## 2010-08-01 LAB — URINALYSIS, MICROSCOPIC ONLY
Bilirubin Urine: NEGATIVE
Glucose, UA: NEGATIVE mg/dL
Hgb urine dipstick: NEGATIVE
Leukocytes, UA: NEGATIVE
Nitrite: NEGATIVE
Protein, ur: NEGATIVE mg/dL
Specific Gravity, Urine: 1.02 (ref 1.005–1.030)
Urobilinogen, UA: 0.2 mg/dL (ref 0.0–1.0)
pH: 6 (ref 5.0–8.0)

## 2010-08-01 LAB — BRAIN NATRIURETIC PEPTIDE: Pro B Natriuretic peptide (BNP): 46.3 pg/mL (ref 0.0–100.0)

## 2010-08-01 LAB — URIC ACID: Uric Acid, Serum: 1.7 mg/dL — ABNORMAL LOW (ref 2.4–7.0)

## 2010-08-01 LAB — LACTATE DEHYDROGENASE: LDH: 140 U/L (ref 94–250)

## 2010-08-01 LAB — FOLATE: Folate: 18.8 ng/mL

## 2010-08-01 LAB — RETICULOCYTES
RBC.: 3.3 MIL/uL — ABNORMAL LOW (ref 3.87–5.11)
Retic Count, Absolute: 56.1 10*3/uL (ref 19.0–186.0)
Retic Ct Pct: 1.7 % (ref 0.4–3.1)

## 2010-08-01 LAB — HOMOCYSTEINE: Homocysteine: 9.2 umol/L (ref 4.0–15.4)

## 2010-08-01 LAB — VITAMIN B12: Vitamin B-12: 447 pg/mL (ref 211–911)

## 2010-08-01 LAB — TECHNOLOGIST SMEAR REVIEW

## 2010-08-01 NOTE — Assessment & Plan Note (Signed)
Summary: pacer/phy ck rs from bumplist/mt   Visit Type:  Follow-up Referring Provider:  Dr Deborah Chalk Primary Provider:  Thayer Headings MD   History of Present Illness: [name] is a pleasant 77 yo WF with a h/o dementia and complete heart block s/p PPM who presents today for follow-up.  She reports improvement in fatigue and decreased exercise tolerance since her pacemaker generator change in December.  She is participating in rehab.  She was recently taken off of coumadin due to labile INRs.  She denies exertional chest pain, SOB, orthopnea, PND, presyncope, or syncope.  Current Medications (verified): 1)  Citalopram Hydrobromide 10 Mg Tabs (Citalopram Hydrobromide) .... Once Daily 2)  Pravachol 20 Mg Tabs (Pravastatin Sodium) .... Take 1 Tablet By Mouth Once A Day 3)  Levothyroxine Sodium 75 Mcg Tabs (Levothyroxine Sodium) .... Once Daily 4)  Calcium Carbonate 600 Mg Tabs (Calcium Carbonate) .... Once Daily 5)  Divalproex Sodium 125 Mg Tbec (Divalproex Sodium) .... 3 Daily 6)  Fosamax 70 Mg Tabs (Alendronate Sodium) .[name].. 1 Tab Q Week 7)  Vesicare 5 Mg Tabs (Solifenacin Succinate) .... 1/2 Tab Daily 8)  Vitamin D 1000 Unit Tabs (Cholecalciferol) .... Uad 9)  Donepezil Hcl 10 Mg Tabs (Donepezil Hcl) .... Once Daily 10)  Fish Oil   Oil (Fish Oil) .... Daily 11)  Potassium Chloride Crys Cr 20 Meq Cr-Tabs (Potassium Chloride Crys Cr) .... Take One Tablet By Mouth Daily 12)  Namenda 10 Mg Tabs (Memantine Hcl) .... Once Daily 13)  Omeprazole 20 Mg Cpdr (Omeprazole) .... Once Daily 14)  Salsalate 750 Mg Tabs (Salsalate) .... 2 Daily 15)  Docusate Sodium 100 Mg Caps (Docusate Sodium) .... Two Times A Day 16)  Aspirin 81 Mg Tbec (Aspirin) .... Take One Tablet By Mouth Daily  Allergies (verified): No Known Drug Allergies  Past History:  Past Medical History: Arthritis Depression Hyperlipidemia Hypothyroidism Complete HB s/p PPM (MDT) dementia prior DVT and PTE 4/11 previously on  coumadin, recently stopped by Dr Deborah Chalk due to labile INRS hiatal hernia  Past Surgical History: Hemorrhoidectomy Hysterectomy Pacemaker most recent generator 12/11 by JA  Vital Signs:  Patient profile:   77 year old female Height:      60 inches Weight:      147 pounds BMI:     28.81 Pulse rate:   64 / minute BP sitting:   96 / 60  (left arm)  Vitals Entered By: Laurance Flatten CMA (July 26, 2010 12:04 PM)  Physical Exam  General:  mid dementia, elderly female  Head:  normocephalic and atraumatic Eyes:  PERRLA/EOM intact; conjunctiva and lids normal. Mouth:  Teeth, gums and palate normal. Oral mucosa normal. Neck:  supple Chest Wall:  pacemaker pocket is well healed Lungs:  Clear bilaterally to auscultation and percussion. Heart:  RRR(paced) Abdomen:  Bowel sounds positive; abdomen soft and non-tender without masses, organomegaly, or hernias noted. No hepatosplenomegaly. Msk:  walks slowly with a rolling walker Extremities:  No clubbing or cyanosis. Neurologic:  Alert and oriented x 3.   PPM Specifications Following MD:  Hillis Range, MD     Referring MD:  Rolla Plate PPM Vendor:  Medtronic     PPM Model Number:  [id number]     PPM Serial Number:  [id number] H` PPM DOI:  04/18/2010     PPM Implanting MD:  Hillis Range, MD  Lead 1    Location: RA     DOI: 09/28/1996     Model #: [id number]  Serial #: [id number]     Status: active Lead 2    Location: RV     DOI: 09/28/1996     Model #: [id number]     Serial #: [id number]     Status: active  Magnet Response Rate:  BOL 85 ERI 65  Indications:  CHB   PPM Follow Up Pacer Dependent:  Yes      Episodes Coumadin:  Yes  Parameters Mode:  DDDR     Lower Rate Limit:  65     Upper Rate Limit:  130 Paced AV Delay:  150     Sensed AV Delay:  120 MD Comments:  see scanned report from [employer]  Impression & Recommendations:  Problem # 1:  ATRIOVENTRICULAR BLOCK, 3RD DEGREE (ICD-426.0) doing well s/p pacemaker generator change see scanned  report from [employer]  Problem # 2:  PAROXYSMAL ATRIAL FIBRILLATION (ICD-427.31) stable, with all afib <1 minutes coumadin stopped by Dr Deborah Chalk due to labile INRs no changes today

## 2010-08-02 LAB — SEDIMENTATION RATE: Sed Rate: 6 mm/hr (ref 0–22)

## 2010-08-02 LAB — COMPREHENSIVE METABOLIC PANEL
ALT: 10 U/L (ref 0–35)
ALT: 9 U/L (ref 0–35)
ALT: <8 U/L (ref 0–35)
AST: 16 U/L (ref 0–37)
AST: 18 U/L (ref 0–37)
AST: 20 U/L (ref 0–37)
Albumin: 2.5 g/dL — ABNORMAL LOW (ref 3.5–5.2)
Albumin: 3 g/dL — ABNORMAL LOW (ref 3.5–5.2)
Albumin: 3 g/dL — ABNORMAL LOW (ref 3.5–5.2)
Alkaline Phosphatase: 48 U/L (ref 39–117)
Alkaline Phosphatase: 52 U/L (ref 39–117)
Alkaline Phosphatase: 59 U/L (ref 39–117)
BUN: 17 mg/dL (ref 6–23)
BUN: 18 mg/dL (ref 6–23)
BUN: 23 mg/dL (ref 6–23)
CO2: 27 mEq/L (ref 19–32)
CO2: 28 mEq/L (ref 19–32)
CO2: 28 mEq/L (ref 19–32)
Calcium: 8.2 mg/dL — ABNORMAL LOW (ref 8.4–10.5)
Calcium: 9 mg/dL (ref 8.4–10.5)
Calcium: 9.1 mg/dL (ref 8.4–10.5)
Chloride: 102 mEq/L (ref 96–112)
Chloride: 104 mEq/L (ref 96–112)
Chloride: 104 mEq/L (ref 96–112)
Creatinine, Ser: 0.95 mg/dL (ref 0.4–1.2)
Creatinine, Ser: 1.08 mg/dL (ref 0.4–1.2)
Creatinine, Ser: 1.08 mg/dL (ref 0.4–1.2)
GFR calc Af Amer: 58 mL/min — ABNORMAL LOW (ref >60)
GFR calc Af Amer: 58 mL/min — ABNORMAL LOW (ref >60)
GFR calc Af Amer: >60 mL/min (ref >60)
GFR calc non Af Amer: 48 mL/min — ABNORMAL LOW (ref >60)
GFR calc non Af Amer: 48 mL/min — ABNORMAL LOW (ref >60)
GFR calc non Af Amer: 56 mL/min — ABNORMAL LOW (ref >60)
Glucose, Bld: 101 mg/dL — ABNORMAL HIGH (ref 70–99)
Glucose, Bld: 102 mg/dL — ABNORMAL HIGH (ref 70–99)
Glucose, Bld: 92 mg/dL (ref 70–99)
Potassium: 4 mEq/L (ref 3.5–5.1)
Potassium: 4.3 mEq/L (ref 3.5–5.1)
Potassium: 4.5 mEq/L (ref 3.5–5.1)
Sodium: 137 mEq/L (ref 135–145)
Sodium: 138 mEq/L (ref 135–145)
Sodium: 139 mEq/L (ref 135–145)
Total Bilirubin: 0.3 mg/dL (ref 0.3–1.2)
Total Bilirubin: 0.4 mg/dL (ref 0.3–1.2)
Total Bilirubin: 0.5 mg/dL (ref 0.3–1.2)
Total Protein: 5.2 g/dL — ABNORMAL LOW (ref 6.0–8.3)
Total Protein: 6.1 g/dL (ref 6.0–8.3)
Total Protein: 6.2 g/dL (ref 6.0–8.3)

## 2010-08-02 LAB — CBC
HCT: 33.4 % — ABNORMAL LOW (ref 36.0–46.0)
HCT: 37.5 % (ref 36.0–46.0)
HCT: 39 % (ref 36.0–46.0)
Hemoglobin: 11.6 g/dL — ABNORMAL LOW (ref 12.0–15.0)
Hemoglobin: 12.8 g/dL (ref 12.0–15.0)
Hemoglobin: 13.4 g/dL (ref 12.0–15.0)
MCHC: 34 g/dL (ref 30.0–36.0)
MCHC: 34.3 g/dL (ref 30.0–36.0)
MCHC: 34.7 g/dL (ref 30.0–36.0)
MCV: 100.5 fL — ABNORMAL HIGH (ref 78.0–100.0)
MCV: 100.9 fL — ABNORMAL HIGH (ref 78.0–100.0)
MCV: 101.1 fL — ABNORMAL HIGH (ref 78.0–100.0)
Platelets: 125 10*3/uL — ABNORMAL LOW (ref 150–400)
Platelets: 148 10*3/uL — ABNORMAL LOW (ref 150–400)
Platelets: 177 10*3/uL (ref 150–400)
RBC: 3.31 MIL/uL — ABNORMAL LOW (ref 3.87–5.11)
RBC: 3.73 MIL/uL — ABNORMAL LOW (ref 3.87–5.11)
RBC: 3.86 MIL/uL — ABNORMAL LOW (ref 3.87–5.11)
RDW: 13.6 % (ref 11.5–15.5)
RDW: 13.8 % (ref 11.5–15.5)
RDW: 14.1 % (ref 11.5–15.5)
WBC: 8.4 10*3/uL (ref 4.0–10.5)
WBC: 8.4 10*3/uL (ref 4.0–10.5)
WBC: 8.8 10*3/uL (ref 4.0–10.5)

## 2010-08-02 LAB — URINALYSIS, ROUTINE W REFLEX MICROSCOPIC
Bilirubin Urine: NEGATIVE
Glucose, UA: NEGATIVE mg/dL
Ketones, ur: NEGATIVE mg/dL
Nitrite: NEGATIVE
Protein, ur: NEGATIVE mg/dL
Specific Gravity, Urine: 1.014 (ref 1.005–1.030)
Urobilinogen, UA: 0.2 mg/dL (ref 0.0–1.0)
pH: 5.5 (ref 5.0–8.0)

## 2010-08-02 LAB — POCT CARDIAC MARKERS
CKMB, poc: <1 ng/mL — ABNORMAL LOW (ref 1.0–8.0)
Myoglobin, poc: 28.8 ng/mL (ref 12–200)
Troponin i, poc: <0.05 ng/mL (ref 0.00–0.09)

## 2010-08-02 LAB — LIPID PANEL
Cholesterol: 141 mg/dL (ref 0–200)
HDL: 40 mg/dL (ref >39)
LDL Cholesterol: 83 mg/dL (ref 0–99)
Total CHOL/HDL Ratio: 3.5 RATIO
Triglycerides: 90 mg/dL (ref <150)
VLDL: 18 mg/dL (ref 0–40)

## 2010-08-02 LAB — DIFFERENTIAL
Basophils Absolute: 0 10*3/uL (ref 0.0–0.1)
Basophils Absolute: 0 10*3/uL (ref 0.0–0.1)
Basophils Relative: 1 % (ref 0–1)
Basophils Relative: 1 % (ref 0–1)
Eosinophils Absolute: 0.1 10*3/uL (ref 0.0–0.7)
Eosinophils Absolute: 0.2 10*3/uL (ref 0.0–0.7)
Eosinophils Relative: 1 % (ref 0–5)
Eosinophils Relative: 3 % (ref 0–5)
Lymphocytes Relative: 26 % (ref 12–46)
Lymphocytes Relative: 31 % (ref 12–46)
Lymphs Abs: 2.3 10*3/uL (ref 0.7–4.0)
Lymphs Abs: 2.6 10*3/uL (ref 0.7–4.0)
Monocytes Absolute: 1.1 10*3/uL — ABNORMAL HIGH (ref 0.1–1.0)
Monocytes Absolute: 1.1 10*3/uL — ABNORMAL HIGH (ref 0.1–1.0)
Monocytes Relative: 12 % (ref 3–12)
Monocytes Relative: 13 % — ABNORMAL HIGH (ref 3–12)
Neutro Abs: 4.6 10*3/uL (ref 1.7–7.7)
Neutro Abs: 5.1 10*3/uL (ref 1.7–7.7)
Neutrophils Relative %: 54 % (ref 43–77)
Neutrophils Relative %: 58 % (ref 43–77)

## 2010-08-02 LAB — URINE MICROSCOPIC-ADD ON

## 2010-08-02 LAB — HEPARIN LEVEL (UNFRACTIONATED)
Heparin Unfractionated: 0.61 IU/mL (ref 0.30–0.70)
Heparin Unfractionated: 0.64 IU/mL (ref 0.30–0.70)

## 2010-08-02 LAB — PROTIME-INR
INR: 0.95 (ref 0.00–1.49)
Prothrombin Time: 12.6 seconds (ref 11.6–15.2)

## 2010-08-02 LAB — URINE CULTURE: Colony Count: >=100000

## 2010-08-02 LAB — APTT: aPTT: 32 seconds (ref 24–37)

## 2010-08-02 LAB — MRSA PCR SCREENING: MRSA by PCR: NEGATIVE

## 2010-08-02 LAB — TSH: TSH: 4.671 u[IU]/mL — ABNORMAL HIGH (ref 0.350–4.500)

## 2010-08-06 LAB — URINE MICROSCOPIC-ADD ON

## 2010-08-06 LAB — BASIC METABOLIC PANEL
BUN: 11 mg/dL (ref 6–23)
BUN: 2 mg/dL — ABNORMAL LOW (ref 6–23)
BUN: 2 mg/dL — ABNORMAL LOW (ref 6–23)
BUN: 6 mg/dL (ref 6–23)
BUN: 9 mg/dL (ref 6–23)
CO2: 25 mEq/L (ref 19–32)
CO2: 28 mEq/L (ref 19–32)
CO2: 28 mEq/L (ref 19–32)
CO2: 28 mEq/L (ref 19–32)
CO2: 29 mEq/L (ref 19–32)
Calcium: 7.9 mg/dL — ABNORMAL LOW (ref 8.4–10.5)
Calcium: 8 mg/dL — ABNORMAL LOW (ref 8.4–10.5)
Calcium: 8.2 mg/dL — ABNORMAL LOW (ref 8.4–10.5)
Calcium: 8.3 mg/dL — ABNORMAL LOW (ref 8.4–10.5)
Calcium: 9 mg/dL (ref 8.4–10.5)
Chloride: 106 mEq/L (ref 96–112)
Chloride: 107 mEq/L (ref 96–112)
Chloride: 107 mEq/L (ref 96–112)
Chloride: 108 mEq/L (ref 96–112)
Chloride: 109 mEq/L (ref 96–112)
Creatinine, Ser: 0.66 mg/dL (ref 0.4–1.2)
Creatinine, Ser: 0.71 mg/dL (ref 0.4–1.2)
Creatinine, Ser: 0.77 mg/dL (ref 0.4–1.2)
Creatinine, Ser: 0.85 mg/dL (ref 0.4–1.2)
Creatinine, Ser: 1.02 mg/dL (ref 0.4–1.2)
GFR calc Af Amer: >60 mL/min (ref >60)
GFR calc Af Amer: >60 mL/min (ref >60)
GFR calc Af Amer: >60 mL/min (ref >60)
GFR calc Af Amer: >60 mL/min (ref >60)
GFR calc Af Amer: >60 mL/min (ref >60)
GFR calc non Af Amer: 52 mL/min — ABNORMAL LOW (ref >60)
GFR calc non Af Amer: >60 mL/min (ref >60)
GFR calc non Af Amer: >60 mL/min (ref >60)
GFR calc non Af Amer: >60 mL/min (ref >60)
GFR calc non Af Amer: >60 mL/min (ref >60)
Glucose, Bld: 107 mg/dL — ABNORMAL HIGH (ref 70–99)
Glucose, Bld: 146 mg/dL — ABNORMAL HIGH (ref 70–99)
Glucose, Bld: 82 mg/dL (ref 70–99)
Glucose, Bld: 89 mg/dL (ref 70–99)
Glucose, Bld: 95 mg/dL (ref 70–99)
Potassium: 3.3 mEq/L — ABNORMAL LOW (ref 3.5–5.1)
Potassium: 3.3 mEq/L — ABNORMAL LOW (ref 3.5–5.1)
Potassium: 3.4 mEq/L — ABNORMAL LOW (ref 3.5–5.1)
Potassium: 3.6 mEq/L (ref 3.5–5.1)
Potassium: 4 mEq/L (ref 3.5–5.1)
Sodium: 140 mEq/L (ref 135–145)
Sodium: 140 mEq/L (ref 135–145)
Sodium: 142 mEq/L (ref 135–145)
Sodium: 142 mEq/L (ref 135–145)
Sodium: 143 mEq/L (ref 135–145)

## 2010-08-06 LAB — DIFFERENTIAL
Basophils Absolute: 0 10*3/uL (ref 0.0–0.1)
Basophils Absolute: 0 10*3/uL (ref 0.0–0.1)
Basophils Absolute: 0 10*3/uL (ref 0.0–0.1)
Basophils Relative: 1 % (ref 0–1)
Basophils Relative: 1 % (ref 0–1)
Basophils Relative: 1 % (ref 0–1)
Eosinophils Absolute: 0.2 10*3/uL (ref 0.0–0.7)
Eosinophils Absolute: 0.2 10*3/uL (ref 0.0–0.7)
Eosinophils Absolute: 0.3 10*3/uL (ref 0.0–0.7)
Eosinophils Relative: 3 % (ref 0–5)
Eosinophils Relative: 4 % (ref 0–5)
Eosinophils Relative: 7 % — ABNORMAL HIGH (ref 0–5)
Lymphocytes Relative: 35 % (ref 12–46)
Lymphocytes Relative: 38 % (ref 12–46)
Lymphocytes Relative: 39 % (ref 12–46)
Lymphs Abs: 1.5 10*3/uL (ref 0.7–4.0)
Lymphs Abs: 1.5 10*3/uL (ref 0.7–4.0)
Lymphs Abs: 2 10*3/uL (ref 0.7–4.0)
Monocytes Absolute: 0.4 10*3/uL (ref 0.1–1.0)
Monocytes Absolute: 0.5 10*3/uL (ref 0.1–1.0)
Monocytes Absolute: 0.8 10*3/uL (ref 0.1–1.0)
Monocytes Relative: 11 % (ref 3–12)
Monocytes Relative: 14 % — ABNORMAL HIGH (ref 3–12)
Monocytes Relative: 14 % — ABNORMAL HIGH (ref 3–12)
Neutro Abs: 1.7 10*3/uL (ref 1.7–7.7)
Neutro Abs: 1.7 10*3/uL (ref 1.7–7.7)
Neutro Abs: 2.7 10*3/uL (ref 1.7–7.7)
Neutrophils Relative %: 42 % — ABNORMAL LOW (ref 43–77)
Neutrophils Relative %: 43 % (ref 43–77)
Neutrophils Relative %: 47 % (ref 43–77)

## 2010-08-06 LAB — CBC
HCT: 36.5 % (ref 36.0–46.0)
HCT: 36.8 % (ref 36.0–46.0)
HCT: 37.3 % (ref 36.0–46.0)
HCT: 37.5 % (ref 36.0–46.0)
HCT: 38.2 % (ref 36.0–46.0)
HCT: 39.9 % (ref 36.0–46.0)
Hemoglobin: 12 g/dL (ref 12.0–15.0)
Hemoglobin: 12.1 g/dL (ref 12.0–15.0)
Hemoglobin: 12.2 g/dL (ref 12.0–15.0)
Hemoglobin: 12.2 g/dL (ref 12.0–15.0)
Hemoglobin: 12.3 g/dL (ref 12.0–15.0)
Hemoglobin: 13.1 g/dL (ref 12.0–15.0)
MCHC: 32.1 g/dL (ref 30.0–36.0)
MCHC: 32.4 g/dL (ref 30.0–36.0)
MCHC: 32.7 g/dL (ref 30.0–36.0)
MCHC: 32.7 g/dL (ref 30.0–36.0)
MCHC: 32.9 g/dL (ref 30.0–36.0)
MCHC: 32.9 g/dL (ref 30.0–36.0)
MCV: 103.8 fL — ABNORMAL HIGH (ref 78.0–100.0)
MCV: 103.9 fL — ABNORMAL HIGH (ref 78.0–100.0)
MCV: 104.1 fL — ABNORMAL HIGH (ref 78.0–100.0)
MCV: 104.3 fL — ABNORMAL HIGH (ref 78.0–100.0)
MCV: 105 fL — ABNORMAL HIGH (ref 78.0–100.0)
MCV: 105.3 fL — ABNORMAL HIGH (ref 78.0–100.0)
Platelets: 129 10*3/uL — ABNORMAL LOW (ref 150–400)
Platelets: 132 10*3/uL — ABNORMAL LOW (ref 150–400)
Platelets: 134 10*3/uL — ABNORMAL LOW (ref 150–400)
Platelets: 144 10*3/uL — ABNORMAL LOW (ref 150–400)
Platelets: 148 10*3/uL — ABNORMAL LOW (ref 150–400)
Platelets: 167 10*3/uL (ref 150–400)
RBC: 3.49 MIL/uL — ABNORMAL LOW (ref 3.87–5.11)
RBC: 3.54 MIL/uL — ABNORMAL LOW (ref 3.87–5.11)
RBC: 3.55 MIL/uL — ABNORMAL LOW (ref 3.87–5.11)
RBC: 3.61 MIL/uL — ABNORMAL LOW (ref 3.87–5.11)
RBC: 3.68 MIL/uL — ABNORMAL LOW (ref 3.87–5.11)
RBC: 3.79 MIL/uL — ABNORMAL LOW (ref 3.87–5.11)
RDW: 13.6 % (ref 11.5–15.5)
RDW: 13.7 % (ref 11.5–15.5)
RDW: 13.8 % (ref 11.5–15.5)
RDW: 13.9 % (ref 11.5–15.5)
RDW: 14.2 % (ref 11.5–15.5)
RDW: 14.3 % (ref 11.5–15.5)
WBC: 3.7 10*3/uL — ABNORMAL LOW (ref 4.0–10.5)
WBC: 3.9 10*3/uL — ABNORMAL LOW (ref 4.0–10.5)
WBC: 4 10*3/uL (ref 4.0–10.5)
WBC: 4.4 10*3/uL (ref 4.0–10.5)
WBC: 4.4 10*3/uL (ref 4.0–10.5)
WBC: 5.8 10*3/uL (ref 4.0–10.5)

## 2010-08-06 LAB — CULTURE, BLOOD (ROUTINE X 2)
Culture: NO GROWTH
Culture: NO GROWTH

## 2010-08-06 LAB — URINALYSIS, ROUTINE W REFLEX MICROSCOPIC
Bilirubin Urine: NEGATIVE
Bilirubin Urine: NEGATIVE
Glucose, UA: NEGATIVE mg/dL
Glucose, UA: NEGATIVE mg/dL
Hgb urine dipstick: NEGATIVE
Ketones, ur: 15 mg/dL — AB
Ketones, ur: NEGATIVE mg/dL
Nitrite: NEGATIVE
Nitrite: NEGATIVE
Protein, ur: NEGATIVE mg/dL
Protein, ur: NEGATIVE mg/dL
Specific Gravity, Urine: 1.007 (ref 1.005–1.030)
Specific Gravity, Urine: 1.024 (ref 1.005–1.030)
Urobilinogen, UA: 0.2 mg/dL (ref 0.0–1.0)
Urobilinogen, UA: 1 mg/dL (ref 0.0–1.0)
pH: 6 (ref 5.0–8.0)
pH: 7 (ref 5.0–8.0)

## 2010-08-06 LAB — URINE CULTURE
Colony Count: >=100000
Special Requests: POSITIVE

## 2010-08-06 LAB — BRAIN NATRIURETIC PEPTIDE: Pro B Natriuretic peptide (BNP): 113 pg/mL — ABNORMAL HIGH (ref 0.0–100.0)

## 2010-08-10 NOTE — Cardiovascular Report (Signed)
Summary: Office Visit   Office Visit   Imported By: Roderic Ovens 08/02/2010 16:22:56  _____________________________________________________________________  External Attachment:    Type:   Image     Comment:   External Document

## 2010-08-18 LAB — GLUCOSE, CAPILLARY: Glucose-Capillary: 78 mg/dL (ref 70–99)

## 2010-09-12 HISTORY — PX: CHOLECYSTECTOMY: SHX55

## 2010-09-24 ENCOUNTER — Emergency Department (HOSPITAL_COMMUNITY): Payer: Medicare Other

## 2010-09-24 ENCOUNTER — Inpatient Hospital Stay (HOSPITAL_COMMUNITY)
Admission: EM | Admit: 2010-09-24 | Discharge: 2010-09-28 | DRG: 418 | Disposition: A | Discharge status: Skilled Nursing Facility | Payer: Medicare Other | Attending: Internal Medicine | Admitting: Internal Medicine

## 2010-09-24 DIAGNOSIS — E876 Hypokalemia: Secondary | ICD-10-CM | POA: Diagnosis not present

## 2010-09-24 DIAGNOSIS — E039 Hypothyroidism, unspecified: Secondary | ICD-10-CM | POA: Diagnosis present

## 2010-09-24 DIAGNOSIS — E785 Hyperlipidemia, unspecified: Secondary | ICD-10-CM | POA: Diagnosis present

## 2010-09-24 DIAGNOSIS — I10 Essential (primary) hypertension: Secondary | ICD-10-CM | POA: Diagnosis present

## 2010-09-24 DIAGNOSIS — K219 Gastro-esophageal reflux disease without esophagitis: Secondary | ICD-10-CM | POA: Diagnosis present

## 2010-09-24 DIAGNOSIS — J9819 Other pulmonary collapse: Secondary | ICD-10-CM | POA: Diagnosis not present

## 2010-09-24 DIAGNOSIS — R55 Syncope and collapse: Secondary | ICD-10-CM | POA: Diagnosis present

## 2010-09-24 DIAGNOSIS — Z01810 Encounter for preprocedural cardiovascular examination: Secondary | ICD-10-CM

## 2010-09-24 DIAGNOSIS — Z950 Presence of cardiac pacemaker: Secondary | ICD-10-CM

## 2010-09-24 DIAGNOSIS — Z66 Do not resuscitate: Secondary | ICD-10-CM | POA: Diagnosis present

## 2010-09-24 DIAGNOSIS — K8000 Calculus of gallbladder with acute cholecystitis without obstruction: Principal | ICD-10-CM | POA: Diagnosis present

## 2010-09-24 DIAGNOSIS — F0390 Unspecified dementia without behavioral disturbance: Secondary | ICD-10-CM | POA: Diagnosis present

## 2010-09-24 DIAGNOSIS — R5381 Other malaise: Secondary | ICD-10-CM | POA: Diagnosis present

## 2010-09-24 LAB — DIFFERENTIAL
Basophils Absolute: 0 10*3/uL (ref 0.0–0.1)
Basophils Relative: 0 % (ref 0–1)
Eosinophils Absolute: 0 10*3/uL (ref 0.0–0.7)
Eosinophils Relative: 0 % (ref 0–5)
Lymphocytes Relative: 11 % — ABNORMAL LOW (ref 12–46)
Lymphs Abs: 1.4 10*3/uL (ref 0.7–4.0)
Monocytes Absolute: 0.9 10*3/uL (ref 0.1–1.0)
Monocytes Relative: 8 % (ref 3–12)
Neutro Abs: 10 10*3/uL — ABNORMAL HIGH (ref 1.7–7.7)
Neutrophils Relative %: 81 % — ABNORMAL HIGH (ref 43–77)

## 2010-09-24 LAB — URINALYSIS, ROUTINE W REFLEX MICROSCOPIC
Bilirubin Urine: NEGATIVE
Glucose, UA: NEGATIVE mg/dL
Hgb urine dipstick: NEGATIVE
Ketones, ur: NEGATIVE mg/dL
Nitrite: NEGATIVE
Protein, ur: NEGATIVE mg/dL
Specific Gravity, Urine: 1.023 (ref 1.005–1.030)
Urobilinogen, UA: 1 mg/dL (ref 0.0–1.0)
pH: 7 (ref 5.0–8.0)

## 2010-09-24 LAB — POCT CARDIAC MARKERS
CKMB, poc: <1 ng/mL — ABNORMAL LOW (ref 1.0–8.0)
Myoglobin, poc: 53.9 ng/mL (ref 12–200)
Troponin i, poc: <0.05 ng/mL (ref 0.00–0.09)

## 2010-09-24 LAB — HEPATIC FUNCTION PANEL
ALT: 12 U/L (ref 0–35)
AST: 17 U/L (ref 0–37)
Albumin: 3.4 g/dL — ABNORMAL LOW (ref 3.5–5.2)
Alkaline Phosphatase: 51 U/L (ref 39–117)
Bilirubin, Direct: 0.1 mg/dL (ref 0.0–0.3)
Indirect Bilirubin: 0.1 mg/dL — ABNORMAL LOW (ref 0.3–0.9)
Total Bilirubin: 0.2 mg/dL — ABNORMAL LOW (ref 0.3–1.2)
Total Protein: 6.5 g/dL (ref 6.0–8.3)

## 2010-09-24 LAB — POCT I-STAT, CHEM 8
BUN: 19 mg/dL (ref 6–23)
Calcium, Ion: 1.04 mmol/L — ABNORMAL LOW (ref 1.12–1.32)
Chloride: 110 mEq/L (ref 96–112)
Creatinine, Ser: 0.9 mg/dL (ref 0.4–1.2)
Glucose, Bld: 88 mg/dL (ref 70–99)
HCT: 47 % — ABNORMAL HIGH (ref 36.0–46.0)
Hemoglobin: 16 g/dL — ABNORMAL HIGH (ref 12.0–15.0)
Potassium: 3.4 mEq/L — ABNORMAL LOW (ref 3.5–5.1)
Sodium: 142 mEq/L (ref 135–145)
TCO2: 21 mmol/L (ref 0–100)

## 2010-09-24 LAB — PROCALCITONIN: Procalcitonin: <0.1 ng/mL

## 2010-09-24 LAB — CBC
HCT: 44.2 % (ref 36.0–46.0)
Hemoglobin: 15.1 g/dL — ABNORMAL HIGH (ref 12.0–15.0)
MCH: 33.8 pg (ref 26.0–34.0)
MCHC: 34.2 g/dL (ref 30.0–36.0)
MCV: 98.9 fL (ref 78.0–100.0)
Platelets: 151 10*3/uL (ref 150–400)
RBC: 4.47 MIL/uL (ref 3.87–5.11)
RDW: 13.2 % (ref 11.5–15.5)
WBC: 12.4 10*3/uL — ABNORMAL HIGH (ref 4.0–10.5)

## 2010-09-24 LAB — OCCULT BLOOD, POC DEVICE: Fecal Occult Bld: NEGATIVE

## 2010-09-24 LAB — LIPASE, BLOOD: Lipase: 18 U/L (ref 11–59)

## 2010-09-24 LAB — LACTIC ACID, PLASMA: Lactic Acid, Venous: 1.2 mmol/L (ref 0.5–2.2)

## 2010-09-24 LAB — TSH: TSH: 0.315 u[IU]/mL — ABNORMAL LOW (ref 0.350–4.500)

## 2010-09-24 MED ORDER — IOHEXOL 300 MG/ML  SOLN
125.0000 mL | Freq: Once | INTRAMUSCULAR | Status: AC | PRN
  Administered 2010-09-24: 125 mL via INTRAVENOUS

## 2010-09-24 NOTE — Consult Note (Signed)
NAME:  [name], BENETT NO.:  [id number]  MEDICAL RECORD NO.:  [id number]           PATIENT TYPE:  E  LOCATION:  WLED                         FACILITY:  [facility]  PHYSICIAN:  Luis Abed, MD, FACCDATE OF BIRTH:  July 25, 1922  DATE OF CONSULTATION: DATE OF DISCHARGE:                                CONSULTATION   HISTORY OF PRESENT ILLNESS:  The patient has acute cholecystitis.  I am called to see her in the emergency room at [facility].  We are asked to clear her from the cardiac viewpoint for cholecystectomy.  She has history of a pacemaker in the past.  She has been seen recently by Dr. Johney Frame of our group and her pacemaker is working well.  There is a history of good left ventricular function.  In the chart, there is question of chronic heart failure.  There is no other obtainable history about this problem.  She does not have shortness of breath or chest pain.  Her family does not remember any times when she had any marked fluid overload.  She does have a history of DVT and pulmonary emboli in the past.  Her Coumadin had to be stopped recently because of lability in the INR measurements.  ALLERGIES:  No known drug allergies.  MEDICATIONS:  Vitamins, calcium, thyroid 50 mcg daily, Pravachol, aspirin,  potassium.  Other medical problems, see the complete list below.  SOCIAL HISTORY:  The patient does not smoke.  FAMILY HISTORY:  There is no significant family history of coronary disease.  REVIEW OF SYSTEMS:  Review of systems is obtained from the patient with her daughter present.  Currently she denies headache, sweats, rash, change in vision, change in hearing, cough, urinary symptoms.  All other systems are reviewed and are negative other than the HPI.  PHYSICAL EXAMINATION:  VITAL SIGNS:  Blood pressure is 105/50 with a pulse of 83. GENERAL:  The patient is oriented to person.  She is stable.  Her affect is normal. HEENT:  Head is atraumatic.   There is no jugular venous distention. LUNGS:  Clear.  There is no respiratory distress. CARDIAC:  Exam reveals S1 and S2.  There are no clicks or significant murmurs. ABDOMEN:  Soft. EXTREMITIES:  There may be trace peripheral edema.  RADIOLOGY AND LABORATORY DATA:  EKG has a wavy baseline but I believe it is paced.  Hemoglobin is 15.  White count is 12,400.  Potassium is 3.4. BUN 19 and creatinine 0.9.  PROBLEMS: 1. Pacemaker history.  This was checked recently by Dr. Johney Frame and it     is working well. 2. History of good left ventricular function.  Ejection fraction 55%     by echo in April 2011. 3. History of DVT and pulmonary emboli.  She clearly will need careful     attention to this issue.  She has not been on Coumadin because of     her difficulty with managing this.  Very careful DVT prophylaxis     will be important immediately after surgery. 4. History of hypertension. 5. History of dementia. 6. Gastroesophageal reflux disease. 7. Hyperlipidemia. 8. Coumadin therapy  in the past.  She is off this now due to labile     INRs. 9. Acute cholecystitis for which she needs surgery. 10.Question in the chart in the past of diastolic CHF.  It is possible     that she may have had an episode of volume overload in the past but     this is not well documented.  There is no history of significant     ongoing congestive heart failure. 11.Hypothyroidism.  The patient is on Synthroid.  I must assume she is     hypothyroid.  TSH is to be checked. 12.Potassium of 3.4.  This needs to be replaced.  This will have to be     done IV as she is n.p.o. now.  The patient's overall cardiac status is stable.  There is no evidence of unstable cardiac symptoms.  There is no sign of heart failure.  She is cleared for cholecystectomy.  I would give careful attention to her volume status and very careful attention to careful DVT prophylaxis after the surgery.     Luis Abed, MD,  [facility]     JDK/MEDQ  D:  09/24/2010  T:  09/24/2010  Job:  [id number]  Electronically Signed by Willa Rough MD FACC on 09/24/2010 03:14:04 PM

## 2010-09-25 ENCOUNTER — Other Ambulatory Visit (INDEPENDENT_AMBULATORY_CARE_PROVIDER_SITE_OTHER): Payer: Self-pay | Admitting: General Surgery

## 2010-09-25 LAB — CBC
HCT: 38.8 % (ref 36.0–46.0)
Hemoglobin: 12.5 g/dL (ref 12.0–15.0)
MCH: 32.4 pg (ref 26.0–34.0)
MCHC: 32.2 g/dL (ref 30.0–36.0)
MCV: 100.5 fL — ABNORMAL HIGH (ref 78.0–100.0)
Platelets: 125 10*3/uL — ABNORMAL LOW (ref 150–400)
RBC: 3.86 MIL/uL — ABNORMAL LOW (ref 3.87–5.11)
RDW: 13.6 % (ref 11.5–15.5)
WBC: 6.6 10*3/uL (ref 4.0–10.5)

## 2010-09-25 LAB — DIFFERENTIAL
Basophils Absolute: 0 10*3/uL (ref 0.0–0.1)
Basophils Relative: 1 % (ref 0–1)
Eosinophils Absolute: 0.1 10*3/uL (ref 0.0–0.7)
Eosinophils Relative: 1 % (ref 0–5)
Lymphocytes Relative: 41 % (ref 12–46)
Lymphs Abs: 2.7 10*3/uL (ref 0.7–4.0)
Monocytes Absolute: 0.6 10*3/uL (ref 0.1–1.0)
Monocytes Relative: 10 % (ref 3–12)
Neutro Abs: 3.2 10*3/uL (ref 1.7–7.7)
Neutrophils Relative %: 49 % (ref 43–77)

## 2010-09-25 LAB — SURGICAL PCR SCREEN: Staphylococcus aureus: POSITIVE — AB

## 2010-09-25 LAB — URINE CULTURE
Colony Count: NO GROWTH
Culture  Setup Time: 201205131312
Culture: NO GROWTH

## 2010-09-25 LAB — MAGNESIUM: Magnesium: 2 mg/dL (ref 1.5–2.5)

## 2010-09-25 LAB — APTT: aPTT: 48 seconds — ABNORMAL HIGH (ref 24–37)

## 2010-09-25 LAB — BASIC METABOLIC PANEL
BUN: 11 mg/dL (ref 6–23)
CO2: 23 mEq/L (ref 19–32)
Calcium: 7.8 mg/dL — ABNORMAL LOW (ref 8.4–10.5)
Chloride: 112 mEq/L (ref 96–112)
Creatinine, Ser: 0.67 mg/dL (ref 0.4–1.2)
GFR calc Af Amer: >60 mL/min (ref >60)
GFR calc non Af Amer: >60 mL/min (ref >60)
Glucose, Bld: 77 mg/dL (ref 70–99)
Potassium: 3 mEq/L — ABNORMAL LOW (ref 3.5–5.1)
Sodium: 143 mEq/L (ref 135–145)

## 2010-09-25 LAB — OCCULT BLOOD, POC DEVICE: Fecal Occult Bld: NEGATIVE

## 2010-09-25 LAB — PROTIME-INR
INR: 1.17 (ref 0.00–1.49)
Prothrombin Time: 15.1 seconds (ref 11.6–15.2)

## 2010-09-26 ENCOUNTER — Inpatient Hospital Stay (HOSPITAL_COMMUNITY): Payer: Medicare Other

## 2010-09-26 LAB — CBC
HCT: 36.1 % (ref 36.0–46.0)
Hemoglobin: 12.2 g/dL (ref 12.0–15.0)
MCH: 33.6 pg (ref 26.0–34.0)
MCHC: 33.8 g/dL (ref 30.0–36.0)
MCV: 99.4 fL (ref 78.0–100.0)
Platelets: 128 10*3/uL — ABNORMAL LOW (ref 150–400)
RBC: 3.63 MIL/uL — ABNORMAL LOW (ref 3.87–5.11)
RDW: 13.6 % (ref 11.5–15.5)
WBC: 7.8 10*3/uL (ref 4.0–10.5)

## 2010-09-26 LAB — BASIC METABOLIC PANEL
BUN: 12 mg/dL (ref 6–23)
CO2: 21 mEq/L (ref 19–32)
Calcium: 7.5 mg/dL — ABNORMAL LOW (ref 8.4–10.5)
Chloride: 108 mEq/L (ref 96–112)
Creatinine, Ser: 0.65 mg/dL (ref 0.4–1.2)
GFR calc Af Amer: >60 mL/min (ref >60)
GFR calc non Af Amer: >60 mL/min (ref >60)
Glucose, Bld: 140 mg/dL — ABNORMAL HIGH (ref 70–99)
Potassium: 3.9 mEq/L (ref 3.5–5.1)
Sodium: 137 mEq/L (ref 135–145)

## 2010-09-27 ENCOUNTER — Inpatient Hospital Stay (HOSPITAL_COMMUNITY): Payer: Medicare Other

## 2010-09-27 LAB — CBC
HCT: 33.7 % — ABNORMAL LOW (ref 36.0–46.0)
Hemoglobin: 11.2 g/dL — ABNORMAL LOW (ref 12.0–15.0)
MCH: 32.9 pg (ref 26.0–34.0)
MCHC: 33.2 g/dL (ref 30.0–36.0)
MCV: 99.1 fL (ref 78.0–100.0)
Platelets: 117 10*3/uL — ABNORMAL LOW (ref 150–400)
RBC: 3.4 MIL/uL — ABNORMAL LOW (ref 3.87–5.11)
RDW: 13.3 % (ref 11.5–15.5)
WBC: 7 10*3/uL (ref 4.0–10.5)

## 2010-09-27 LAB — BASIC METABOLIC PANEL WITH GFR
BUN: 7 mg/dL (ref 6–23)
CO2: 25 meq/L (ref 19–32)
Calcium: 7.7 mg/dL — ABNORMAL LOW (ref 8.4–10.5)
Chloride: 108 meq/L (ref 96–112)
Creatinine, Ser: 0.59 mg/dL (ref 0.4–1.2)
GFR calc non Af Amer: >60 mL/min
Glucose, Bld: 109 mg/dL — ABNORMAL HIGH (ref 70–99)
Potassium: 3.2 meq/L — ABNORMAL LOW (ref 3.5–5.1)
Sodium: 139 meq/L (ref 135–145)

## 2010-09-28 LAB — BASIC METABOLIC PANEL
BUN: 5 mg/dL — ABNORMAL LOW (ref 6–23)
CO2: 26 mEq/L (ref 19–32)
Calcium: 8.8 mg/dL (ref 8.4–10.5)
Chloride: 105 mEq/L (ref 96–112)
Creatinine, Ser: 0.54 mg/dL (ref 0.4–1.2)
GFR calc Af Amer: >60 mL/min (ref >60)
GFR calc non Af Amer: >60 mL/min (ref >60)
Glucose, Bld: 107 mg/dL — ABNORMAL HIGH (ref 70–99)
Potassium: 4.1 mEq/L (ref 3.5–5.1)
Sodium: 138 mEq/L (ref 135–145)

## 2010-09-28 LAB — MAGNESIUM: Magnesium: 2.1 mg/dL (ref 1.5–2.5)

## 2010-09-29 NOTE — H&P (Signed)
NAME:  [name], [name] NO.:  [id number]   MEDICAL RECORD NO.:  [id number]                  PATIENT TYPE:  OIB   LOCATION:                                       FACILITY:  [facility]   PHYSICIAN:  Colleen Can. Deborah Chalk, M.D.            DATE OF BIRTH:  23-Jan-1923   DATE OF ADMISSION:  07/13/2002  DATE OF DISCHARGE:                                HISTORY & PHYSICAL   CHIEF COMPLAINT:  Weakness with unsteadiness and subsequent documentation of  end-of-life pulse generator.   HISTORY OF PRESENT ILLNESS:  The patient is a 77 year old female who has  multiple medical problems, who presents to our office today on July 08, 2002, with multiple somatic complaints. She notes that over the past 3 weeks  she has had a questionable viral like syndrome. She has had intermittent  nausea with one episode of vomiting in the middle of the night. She has been  more sleepy and  napping 3 to 5 hours during the day time as well as  sleeping 6 to 8 hours at night. She has an unsteady like feeling that has  basically been constant. She notes that her primary complaint is that of  increasing fatigue. She has also had fluttery-like feeling as well. She has  seen her primary care Tiann Saha and had extensive blood work which was all  unremarkable. She has previously had a carotid Doppler examination which was  reported to her to be unremarkable as well.   She was brought to our office for pacemaker evaluation. She was noted to  have end-of-life parameters and is now referred for elective generator  replacement. She has not had chest pain or shortness of breath.   PAST MEDICAL HISTORY:  Significant for:  1. Chest pain in May 1998 with subsequent cardiac catheterization revealing     normal coronary arteries and normal left ventricular function.  2. Functioning dual chamber pacemaker with a Vigor CPI model [id number],     implanted in May of 1998 in [place] for congestive heart failure  and     complete AV block.  3. History of tonsillectomy.  4. Hemorrhoidectomy.  5. Left ear surgery.  6. Bladder repair.  7. Childbirth x2.  8. Obesity.  9. History of hiatal hernia.  10.      Osteoarthritis.  11.      Hypothyroidism.   CURRENT MEDICATIONS:  1. Aspirin q.d.  2. Elavil 25 mg q.h.s.  3. Salicylate 2 tablets q.d.  4. Prozac 20 mg q.d.  5. Estrace q.d.  6. Atenolol 12.5 q.d.  7. Synthroid 0.05 q.d.  8. Pravachol q.d.   FAMILY HISTORY:  Her father died with hypertension and stroke. Her mother  died with a history of heart disease and arrhythmia.   SOCIAL HISTORY:  She is married. There is no alcohol or tobacco use.   REVIEW OF SYSTEMS:  As noted above and otherwise  unremarkable.   PHYSICAL EXAMINATION:  GENERAL:  She is a very pleasant, elderly white  female who appears somewhat younger than her stated age. Weight 148 pounds.  VITAL SIGNS:  Blood pressure 130/80 sitting, 130/90 standing, heart rate 64,  respirations 18, afebrile.  SKIN:  Warm and dry. Color is unremarkable.  NECK:  Supple.  LUNGS:  Clear.  HEART:  Regular rhythm. Pacemaker in the left upper chest.  ABDOMEN:  Soft, positive bowel sounds, nontender.  EXTREMITIES:  Without edema.  NEUROLOGIC:  Intact, no gross focal deficits.   LABORATORY DATA:  Pertinent labs are pending.   Pacemaker interrogation is performed and reveals end-of-life parameters. It  should be noted that she has had a Guidant Vigor device implanted  and  pauses were documented while interacting with the programmer with battery  voltage at 2.75.   IMPRESSION:  1. End-of-life pulse generator.  2. History of AV block with subsequent pacemaker implantation in May 1998.  3. Previous history of negative cardiac catheterization.  4. Hypothyroidism.  5. Multiple somatic complaints of questionable etiology.   PLAN:  Will proceed on with elective generator replacement. The procedure  has been reviewed in full detail and she is  willing to proceed on Monday,  July 13, 2002.     Juanell Fairly C. Earl Gala, N.P.                 Colleen Can. Deborah Chalk, M.D.    LCO/MEDQ  D:  07/08/2002  T:  07/08/2002  Job:  [id number]   cc:   Quita Skye. Artis Flock, M.D.  [address], [address]  [place]  [place] [address]  Fax: [phone]

## 2010-09-29 NOTE — Discharge Summary (Signed)
[name], [name] NO.:  [id number]   MEDICAL RECORD NO.:  [id number]          PATIENT TYPE:  ORB   LOCATION:  4525                         FACILITY:  [facility]   PHYSICIAN:  Drucilla Schmidt, P.A.      DATE OF BIRTH:  [birth year]   DATE OF ADMISSION:  06/26/2004  DATE OF DISCHARGE:  06/30/2004                                 DISCHARGE SUMMARY   DISCHARGE DIAGNOSES:  1.  Left total knee arthroplasty secondary to osteoarthritis.  2.  History of hypothyroidism.  3.  History of hypertension.  4.  Anemia.  5.  Deep venous thrombosis prophylaxis.  6.  Patient also received a small amount of potassium supplement at time of      admission.  7.  Mild hypokalemia.   HISTORY OF PRESENT ILLNESS:  Patient is a 77 year old white female with a  past medical history of hypothyroidism, cholesterol, and left OA of the  knee, which failed conservative care and elected to undergo a left total  knee arthroplasty on June 21, 2004, by Dr. Ophelia Charter.  Placed on Coumadin for  DVT prophylaxis.  Postoperative complications included lethargy secondary to  meds.  The PT report at this time is that patient is to ambulate at a min-  assist level 50 feet with a rolling walker, transfers min-assist, bed  mobility min-assist.  Patient is transferred to [facility] Subacute  Department on June 26, 2004, for more therapy.  A potassium supplement  was added.   REVIEW OF SYSTEMS:  Significant joint pain.  Denies any weakness, chest  pain, or shortness of breath.  Denies any vomiting.   PAST MEDICAL HISTORY:  1.  Hypothyroidism.  2.  Hypertension.  3.  Elevated cholesterol.  4.  Depression.  5.  Anxiety.   PAST SURGICAL HISTORY:  1.  Pacemaker.  2.  Bladder tack.   SOCIAL HISTORY:  Patient lives in a one-level home with husband, was  independent prior to admission, and husband able to assist some.   FAMILY HISTORY:  Noncontributory.   MEDICATIONS PRIOR TO ADMISSION:  1.  Advil as  directed.  2.  Atenolol 25 mg daily.  3.  Synthroid daily.  4.  Aspirin 81 mg daily.  5.  Prozac 20 mg daily.  6.  Pravachol 20 mg daily.   HOSPITAL COURSE:  Mrs. [name] was admitted to [facility] Subacute  Department on June 26, 2004, for a CBC morning hours and therapies  daily.  Overall, Mrs. [name] progressed very well and is discharged at a  modified-independent level.  She had limited range of motion in her left  knee at the time of discharge, able to flex the knee 0 to 40 degrees at the  time of discharge.  Recommend home PT and home CPM.  Patient able to  tolerate therapies very well once pain management was improved.  Initially,  patient was on Vicodin and Ultram for pain due to increase in pain.  Vicodin  was discontinued and she was sent home on Ultram.  She remained on Coumadin  for DVT prophylaxis without any bleeding complications noted.  She also  remained on ferrous sulfate 325 mg one tab b.i.d.  for anemia.  Latest  hemoglobin performed on June 27, 2004, was 10.9, hematocrit 31.5.  No  other major medical problems occurred while patient was in subacute.   OVERALL ISSUES WHILE IN REHAB:  She remained on Zocor and Tenormin for her  blood pressure.  Her incisions are healing very well and demonstrate no  signs of infection.   LATEST LABS:  Hemoglobin 10.9, hematocrit 31.5, platelet count 336, and  white blood cell count 6.2.  Sodium 132, potassium mild hypokalemia at 3.3,  chloride 101, CO2 28, glucose 100, BUN 12, and creatinine 0.8.  Latest AST  18, ALT 19, alkaline phosphatase 68, and total bilirubin 0.6.  Urine culture  is pending at time dictation.  UA was negative.   Overall, patient progressed very well while in subacute and had nearly a 40-  degree range of motion in the knee, able to perform most ADLs modified  independently, able to ambulate 175 feet modified independently, transfers  modified independently, and bed mobility modified  independently.  She was  discharged home with her family.  Vital signs were stable at time of  discharge.   DISCHARGE MEDICATIONS:  1.  Coumadin 4 mg until June 16, 2006.  2.  Atenolol 25 mg daily.  3.  Prozac 20 mg daily.  4.  Synthroid 50 mcg daily.  5.  Ferrous sulfate 325 twice daily.  6.  Pravachol 20 mg daily.  7.  Calcium plus D daily.  8.  Ultram 1500 mg every 6 to 8 hours as needed for pain.  9.  Oxycodone 5 to 10 mg a day as needed for severe pain.   ACTIVITIES:  No driving.  No alcohol use.  Use walker.  No smoking.  No  aspirin. Aleve, or ibuprofen while on Coumadin.   SPECIAL INSTRUCTIONS:  1.  Remove staples next week.  2.  [facility] for PT and OT.  3.  RN to check Coumadin level on Monday, July 03, 2004.   FOLLOWUP:  1.  Follow up with Dr. Annell Greening in one week - call for appointment.  2.  Follow up with Dr. Artis Flock for this week.  3.  Follow up with Dr. Ellwood Dense as needed.      LB/MEDQ  D:  06/30/2004  T:  07/01/2004  Job:  [id number]   cc:   Quita Skye. Artis Flock, M.D.  [address], [address]  [place]  [place] [address]  Fax: [phone]   Veverly Fells. Ophelia Charter, M.D.  [address] [place], [place] [address]  Fax: [phone]   Ellwood Dense, M.D.  [address] [place]  [place] [address]  Fax: [phone]

## 2010-09-29 NOTE — Op Note (Signed)
   NAMELEZETTE, KITTS NO.:  [id number]   MEDICAL RECORD NO.:  [id number]                   PATIENT TYPE:  OIB   LOCATION:  2899                                 FACILITY:  [facility]   PHYSICIAN:  Colleen Can. Deborah Chalk, M.D.            DATE OF BIRTH:  06-01-1922   DATE OF PROCEDURE:  07/13/2002  DATE OF DISCHARGE:                                 OPERATIVE REPORT   PROCEDURES:  Pulse generator exchange for a dual-chamber pulse generator in  a pacer-dependent pacer.   DESCRIPTION OF PROCEDURE:  The left subclavicular area was prepped and  draped. The old pulse generator was exposed.  It was a Vigor model [id number],  serial [id number], date of implantation, Sep 29, 1986.  Only sharp and blunt  dissection was used, the Bovie was not used. The leads were disconnected  from the old pulse generator.  The atrial lead was a Guidant [id number] active  fixation lead, serial [id number], date of implant Sep 28, 1996.  The atrial  threshold was 2.4 volts to capture at 5.7 mA, current was 0.5 msec, pulse  width, impedance was 626 ohms and P waves were 1.5 mV.  The ventricular lead  was a Guidant 4285 passive fixation lead, serial [id number], date of implant  Sep 28, 1996. The ventricular threshold 0.9 volts to capture, 1.7 mA  current, 0.5 msec pulse width, impedance was 726 ohms and R waves were not  recorded. The wound was flushed with kanamycin solution.  The leads were  connected to a new pulse generator which was a Medtronic Kappa [id number],  serial [id number].  The wound was flushed again with kanamycin solution.  The wound was closed with 2-0 and subsequently 5-0 Dexon and Steri-Strips  were applied.                                                  Colleen Can. Deborah Chalk, M.D.    SNT/MEDQ  D:  07/13/2002  T:  07/13/2002  Job:  [id number]   cc:   Penni Bombard, M.D.

## 2010-09-29 NOTE — Discharge Summary (Signed)
NAME:  [name] NO.:  [id number]   MEDICAL RECORD NO.:  [id number]          PATIENT TYPE:  INP   LOCATION:  5033                         FACILITY:  [facility]   PHYSICIAN:  Mark C. Ophelia Charter, M.D.    DATE OF BIRTH:  1922/06/22   DATE OF ADMISSION:  06/21/2004  DATE OF DISCHARGE:  06/26/2004                                 DISCHARGE SUMMARY   FINAL DIAGNOSIS:  Left knee osteoarthritis.   PROCEDURE:  Left total knee arthroplasty.   HISTORY OF PRESENT ILLNESS:  This is a 77 year old female admitted with left  knee osteoarthritis, bone-on-bone changes, 10 degree flexion contracture, 10  degrees valgus.   ADMISSION MEDICATIONS:  1.  Amigesic 750 mg two p.o. q.h.s.  2.  Advil 800 mg b.i.d.  3.  Prozac 20 mg daily.  4.  Atenolol 25 mg daily.  5.  Valium 5 mg one to two q.h.s. p.r.n.  6.  Aspirin one a day.  7.  Pravachol 20 mg daily.  8.  Levothroid 50 mcg daily.  9.  Prilosec 20 mg OTC p.r.n.  10. Hydra-Eyes two drops daily.   ADMISSION LABS:  Normal CBC, normal PT-PTT, normal chemistry other than a  potassium of 5.2, and hemoglobin A1c was 5.3, which was obtained two days  postop when chemistry panel showed a glucose of 142.  Urine was clean, no  proteins and glucose.   HOSPITAL COURSE:  The patient was admitted and after informed consent  underwent a left cemented total knee arthroplasty with a DePuy PFC Sigma  rotating platform, 2.5 components femur and tibia, and a 10-mm spacer.  A  PCA pump was causing itching.  She received perioperative Ancef prophylaxis,  PT-OT total knee arthroplasty protocol, pharmacy Coumadin prophylaxis.  She  was very sleepy, switched to p.o. medications, had some excessive narcotics  and OxyContin was stopped.  She was placed on just Vicodin and then was  evaluated by Rehab, but she was slightly slow with therapies as well as  slightly emotional and anxious, occasionally tearful.  The swelling was down  in her knee, she was making  satisfactory, slow progress, and was transferred  to Rehab.   CONDITION AT TRANSFER:  Satisfactory.    MCY/MEDQ  D:  08/27/2004  T:  08/27/2004  Job:  [id number]

## 2010-09-29 NOTE — Op Note (Signed)
NAME:  [name], [name]                      ACCOUNT NO.:  [id number]   MEDICAL RECORD NO.:  [id number]                   PATIENT TYPE:  AMB   LOCATION:  DAY                                  FACILITY:  [facility]   PHYSICIAN:  Bertram Millard. Dahlstedt, M.D.          DATE OF BIRTH:  1923/04/05   DATE OF PROCEDURE:  06/28/2003  DATE OF DISCHARGE:                                 OPERATIVE REPORT   PREOPERATIVE DIAGNOSIS:  Stress urinary incontinence, status post  pubovaginal sling 10 years ago.   POSTOPERATIVE DIAGNOSIS:  Stress urinary incontinence, status post  pubovaginal sling 10 years ago.   PRINCIPAL PROCEDURE:  Mentor OB tape sling.   SURGEON:  Bertram Millard. Dahlstedt, M.D.   FIRST ASSISTANT:  Sigmund I. Patsi Sears, M.D.   ANESTHESIA:  General endotracheal.   COMPLICATIONS:  None.   BRIEF HISTORY:  A 77 year old female with significant stress urinary  incontinence.  Additionally, she has some urge symptoms.  She has been on  anticholinergics without symptomatic relief.  She has had urodynamics, which  revealed significant stress urinary incontinence with a low leak point  pressure.  At this point she desires to have surgical management.  She is  aware of risks and complications of an OB tape procedure.  She desires to  proceed.  This is to be performed as an outpatient.   DESCRIPTION OF PROCEDURE:  The patient was administered preoperative IV  antibiotics and taken to the operating room, where a general anesthetic was  administered.  She was placed in the extended lithotomy position.  Genitalia  and perineum were prepped and draped.  The catheter was placed in the  patient's urethra and the bladder drained.  The subvaginal fascia was  infiltrated with 1% lidocaine with epinephrine.  An incision was made over  top of the midurethra and dissection was carried out bilaterally.  Blunt  dissection with a finger was done laterally up to the obturator fossa  bilaterally.  Two stab  incisions were made 5 cm lateral to the clitoris  bilaterally just lateral to the pubic bone.  The Mentor tape was then  brought into the field.  The dissecting trocars were then placed  sequentially through the puncture sites and then guided through the vaginal  incision using a finger on the opposite hand for guidance.  The tape was  then pulled through bilaterally such that it cradled the urethra underneath  with the inked line marking the central part over the urethra.  The tape was  cinched up bilaterally such that it came just underneath the urethra,  admitting a right angle between the tape and the urethra.  The bladder was  then filled.  Tension was put on the tape such that with Crede there was  adequate leakage of water through the urethra.  The tape was cut below skin  level bilaterally.  The vaginal incision was closed using a 2-0 Vicryl  placed in a simple  running fashion.  There was adequate hemostasis prior to  closure.  The stab wounds were closed using Dermabond.  Water 200 mL was  left in the patient's bladder once the catheter was removed so that the  patient could have a voiding trial in the recovery room.   The patient tolerated the procedure well.  She was awakened and taken to the  PACU in stable condition.   She was discharged home on Vicodin one-half to one p.o. q.4h. p.r.n. pain  (#20) and Keflex 500 mg one p.o. t.i.d. for three days.                                               Bertram Millard. Dahlstedt, M.D.    SMD/MEDQ  D:  06/28/2003  T:  06/28/2003  Job:  [id number]   cc:   Quita Skye. Artis Flock, M.D.  [address], [address]  [place]  [place] [address]  Fax: [phone]   Colleen Can. Deborah Chalk, M.D.  Fax: [phone]

## 2010-09-29 NOTE — Assessment & Plan Note (Signed)
[facility]                           GASTROENTEROLOGY OFFICE NOTE   [name], [name]                   MRN:          [id number]  DATE:04/01/2006                            DOB:          05-16-1922    CHIEF COMPLAINT:  Constipation problems worsening.   ASSESSMENT:  1. An 77 year old white woman who has had constipation for a number of      years but is now getting worse having to manually squeeze anal and      rectal area to try to promote defecation. She is eating prunes and      increased fiber with some help at this point. There is no bleeding. She      has never had a colonoscopy.  2. Intermittent solid food dysphagia for the past year or so, perhaps      longer.  3. Heartburn history off and on as well consistent with some reflux. She      thinks stress bothers it some but clearly describes solid food      dysphagia particularly when eating out. A drink of water will make it      pass.   RECOMMENDATIONS/PLAN:  1. Schedule colonoscopy to investigate the constipation and change in      bowel habits.  2. Schedule upper gastrointestinal endoscopy to investigate the dysphagia      and reflux problems and plan for possible esophageal dilation.  3. I have explained the risks, benefits and indications and she      understands and agrees to proceed.   HISTORY:  This is an 77 year old white woman that had an appointment next  week but showed up early so we saw her. Symptoms as above. She does not see  blood. For years, she has had some difficulty defecating with lack of urge  and some straining but it has gotten worse over the past several months if  not longer. Very hard stool, some dark, not black, has to strain quite a bit  and actually reach around and squeeze the anal area to promote defecation.  She tells [place] her stool cards have been negative. She has intermittent solid  food dysphagia as well as heartburn that comes and goes. She  uses antacids  or H2 blockers intermittently. No bleeding is described there either. There  has been no weight loss.   MEDICATIONS:  1. HydroEye plus lutein 1 tablet.  2. Salsalate 750 mg 2 a day.  3. Premarin 3 mg daily.  4. Fluoxetine 20 mg daily.  5. Pravachol 20 mg daily.  6. Atenolol 25 q.h.s.  7. Aspirin 81 mg daily.  8. Levothroid 50 mcg daily.  9. Amitiza 24 mg daily.  10.Calcium 600 mg 2 a day.  11.Wellbutrin XL 150 mg daily.  12.Advil PM q.h.s.   DRUG ALLERGIES:  None known.   PAST MEDICAL HISTORY:  1. Osteoarthritis.  2. Dyslipidemia.  3. Hypothyroidism.  4. Pacemaker.  5. Depression.  6. Partial hysterectomy.  7. Previous hemorrhoid surgery.   FAMILY HISTORY:  Heart disease in parents and siblings, breast cancer in a  sister, no colon cancer.  SOCIAL HISTORY:  She lives with her husband, they have 2 daughters. She and  her husband both still drive. She is a retired Catering manager. No alcohol,  tobacco or drugs. Still very functional.   REVIEW OF SYSTEMS:  She is complaining of redness in her tongue, and it is  sore. There are no white lesions or anything. She recently took some  antibiotics, question Z-Pak versus other for an ear infection. She has had  some pedal edema, insomnia, a little bit of anxiety, joint pain, eyeglasses.  All other systems are negative.   PHYSICAL EXAMINATION:  VITAL SIGNS:  Height 5 feet 1, weight 136 pounds,  blood pressure 120/60, pulse 68.  EYES:  Anicteric.  ENT:  Shows some mild erythema to moderate erythema of the tongue with some  geographic change perhaps.  NECK:  Supple without thyromegaly or mass.  CHEST:  Clear.  HEART:  S1, S2, no murmurs, rubs or gallops.  ABDOMEN:  Soft, nontender without organomegaly or mass.  RECTAL:  Deferred.  LYMPHATIC:  No neck or supraclavicular nodes.  EXTREMITIES:  No peripheral edema is noted.  PSYCH:  She is alert and oriented x3.  SKIN:  No acute rash.   I appreciate the opportunity  to care for this patient.     Iva Boop, MD,FACG  Electronically Signed   CEG/MedQ  DD: 04/01/2006  DT: 04/01/2006  Job #: [id number]   cc:   Quita Skye. Artis Flock, M.D.

## 2010-09-29 NOTE — Op Note (Signed)
NAME:  [name], STRUM NO.:  [id number]   MEDICAL RECORD NO.:  [id number]          PATIENT TYPE:  INP   LOCATION:  5033                         FACILITY:  [facility]   PHYSICIAN:  Mark C. Ophelia Charter, M.D.    DATE OF BIRTH:  Aug 08, 1922   DATE OF PROCEDURE:  06/21/2004  DATE OF DISCHARGE:  06/26/2004                                 OPERATIVE REPORT   PREOPERATIVE DIAGNOSIS:  Left knee osteoarthritis.   POSTOPERATIVE DIAGNOSIS:  Left knee osteoarthritis.   PROCEDURE:  Left total knee arthroplasty, cemented.   SURGEON:  Mark C. Ophelia Charter, M.D.   ASSISTANTLynford Citizen, R.N.F.A.   ANESTHESIA:  GOT plus femoral nerve block and 15 cc local.   TOURNIQUET TIME:  One hour, 9 minutes x350.   ESTIMATED BLOOD LOSS:  100 cc.   INDICATIONS FOR PROCEDURE:  A 77 year old female with progressive  osteoarthritis of the knee, bone on bone changes, marginal osteophytes with  failure to respond to conservative treatment including anti-inflammatories  and injections.   DESCRIPTION OF PROCEDURE:  After induction of general anesthesia and  orotracheal intubation, preoperative Ancef prophylaxis, proximal thigh  tourniquet application, DuraPrep was used, usual total knee, sheets, drapes,  sterile skin marker, impervious stockinette and Betadine Vi-Drape ws  applied. Leg was wrapped with an Esmarch, tourniquet inflated.   Midline incision was made on the left knee.  Medial parapatellar skin  incisions between the quad tendon, between the medial 1/3rd, lateral 2/3rds.  Patella was flipped over and cut from facet, removing 9.5 mm of bone.  Femoral intramedullary rod was placed. Distal femoral cut was made, followed  by AP cut on the tibia using intramedullary alignment as well, set at 0  degrees.  Next cuts were made on the femur. Anteroposterior cuts, chamfer  cuts for 2.5 with 5 degrees of valgus, 0 degrees slope on the tibia.  Femur  was a 2.5 DuPuy PFC, Sigma knee system and a 2.5, ten mm  insert on the tibia  side.  Pulse lavage was used. Rotation was checked, rotating platform was  used. Pulse lavage was used with vacuum mixing of the cement.   Tibia cemented first, followed by femur, tibial tray insert and then  patella. When the cement was hardened at 15 minutes and all excessive cement  had been removed, tourniquet was deflated, hemostasis obtained and knee  closure.  Superficial retinaculum with nonabsorbable sutures, 2-0 Vicryl and  superficial retinaculum, subcutaneous tissue and skin staple closure.  Instrument count, needle counts were correct.      MCY/MEDQ  D:  08/27/2004  T:  08/27/2004  Job:  [id number]

## 2010-09-30 LAB — CULTURE, BLOOD (ROUTINE X 2)
Culture  Setup Time: 201205131312
Culture  Setup Time: 201205131312
Culture: NO GROWTH
Culture: NO GROWTH

## 2010-10-03 NOTE — H&P (Signed)
[name], SANON NO.:  [id number]  MEDICAL RECORD NO.:  [id number]           PATIENT TYPE:  E  LOCATION:  WLED                         FACILITY:  [facility]  PHYSICIAN:  Erick Blinks, MD     DATE OF BIRTH:  Dec 28, 1922  DATE OF ADMISSION:  09/24/2010 DATE OF DISCHARGE:                             HISTORY & PHYSICAL   PRIMARY CARE PHYSICIAN:  Unassigned.  CARDIOLOGIST:  Hillis Range, MD.  CHIEF COMPLAINT:  Abdominal pain.  HISTORY OF PRESENT ILLNESS:  This is an 77 year old female who presents to the emergency room with complaints of abdominal pain.  According to the patient for the past week she has been having epigastric and right upper quadrant pain for the past 1 week.  This occurs more in the afternoon, for the past days has been associated with nausea and vomiting.  The patient had gone to the bathroom today and had felt dizzy and had a near syncopal episode.  EMS was called and the patient was brought to the emergency room.  According to reports, the patient was found to be hypotensive in the field.  She was brought to the emergency room where she was found have a blood pressure in the 90s and a slight temperature of 100.  Further evaluation with a CT of the abdomen and pelvis revealed pneumobilia with gallstones and pericholecystic fluid. The patient was evaluated by the surgical service and felt that she may need a cholecystectomy versus cholecystostomy tube but due to her multiple medical problems, she has been referred to the hospital service for admission.  PAST MEDICAL HISTORY: 1. History of DVT and PE in the past. 2. History of complete heart block status post a permanent pacemaker. 3. Hypertension. 4. GERD. 5. Dyslipidemia. 6. Dementia. 7. Hypothyroidism.  ALLERGIES:  No known drug allergies.  MEDICATIONS:  Prior to admission: 1. Fosamax 2. Calcium plus vitamin D. 3. Citalopram 4. Levothyroxine. 5. Vitamin D3 6. Aricept. 7.  Fish oil. 8. Potassium chloride 9. Disalcid. 10.Multivitamin. 11.Prilosec. 12.Namenda. 13.Easy-Lax. 14.Divalproex. 15.Pravachol. 16.Aspirin. 17.Mucinex  SOCIAL HISTORY:  The patient does not smoke.  She does not drink.  She lives at home with her husband and her daughter.  She ambulates with the assistance of a walker.  FAMILY HISTORY:  Her father had a stroke.  Her mother had heart disease.  REVIEW OF SYSTEMS:  The patient does not have any dysuria.  No diarrhea. She does have abdominal pain, nausea, vomiting as stated above.  She is not having any shortness of breath, chest pain.  She does have a chronic cough which is unchanged.  No headache.  She does have a slight fever. No changes in vision or unilateral weakness or numbness.  She often has dysuria but currently is not having any.  Otherwise, review of systems is negative.  PHYSICAL EXAMINATION:  VITAL SIGNS:  Temperature 98.4, respiratory rate of 18, blood pressure 122/57, heart rate of 71. GENERAL:  The patient is in no acute distress, lying in bed. HEENT:  Normocephalic, atraumatic.  Pupils are equal, round, and reactive to light. NECK:  Supple. CHEST:  Clear to auscultation bilaterally. CARDIAC  EXAM:  Shows S1 and S2 with regular rate and rhythm. ABDOMEN:  Soft, diffusely tender.  Bowel sounds are active. EXTREMITIES:  Showed no cyanosis, clubbing or edema. NEUROLOGIC:  Neurologically the patient has equal strength bilaterally. SKIN:  Warm.  LABORATORY DATA:  Procalcitonin less than 0.1, lipase of 18, albumin of 3.4.  Liver function tests within normal limits.  Alkaline phosphatase at 51.  Lactic acid of 1.2.  Urinalysis shows negative nitrites and negative leukocyte esterase.  Cardiac markers were negative.  Sodium 142, potassium 3.4, chloride 110, BUN 19, creatinine 0.9, glucose 88, ionized calcium 1.04, bicarb of 21.  WBC 12.4, hemoglobin 15.1, platelet count of 151.  CT abdomen and pelvis shows pneumobilia  with gallstones and pericholecystic fluid, however, the gallbladder wall does not appear thickened.  There is no biliary dilatation.  The pneumobilia may be from previous sphincterotomy secondary to cholangitis cannot be completely excluded, but there is clinical concern for acute cholecystitis. Ultrasound and nuclear scintigraphy may prove helpful to further evaluate.  Fecal occult blood is negative.  Chest x-ray shows low volumes with patchy perihilar and bibasilar atelectasis.  ASSESSMENT AND PLAN: 1. Acute cholecystitis.  The patient has been evaluated by the     surgical service and felt that she may require surgical     intervention.  She may go for surgery tomorrow.  We will cover her     with ertapenem for now and continue IV fluids.  We will monitor her     hemodynamic status closely and continue her on clear liquids for     now. 2. Presyncope.  This is likely secondary to volume depletion in the     setting of her cholecystitis. 3. Gastroesophageal reflux disease.  We will continue proton pump     inhibitor. 4. Dyslipidemia.  She will be continued on Pravachol. 5. Dementia.  She will be continued on Aricept and Namenda. 6. Cardiac clearance.  The patient has already been evaluated by     [facility] and felt that no further cardiac workup is     necessary and she is cleared for surgery. 7. Code status.  The patient is a DNR.  This was confirmed with her as     well as her daughter.  Further orders will be per the clinical     course.     Erick Blinks, MD     JM/MEDQ  D:  09/24/2010  T:  09/24/2010  Job:  [id number]  Electronically Signed by Durward Mallard Owin Vignola  on 10/03/2010 04:50:57 PM

## 2010-10-11 NOTE — Discharge Summary (Signed)
NAMEFAWNDA, VITULLO NO.:  [id number]  MEDICAL RECORD NO.:  [id number]           PATIENT TYPE:  I  LOCATION:  1423                         FACILITY:  [facility]  PHYSICIAN:  Kela Millin, M.D.DATE OF BIRTH:  03/06/1923  DATE OF ADMISSION:  09/24/2010 DATE OF DISCHARGE:  09/28/2010                        DISCHARGE SUMMARY - REFERRING   DISCHARGE DIAGNOSES: 1. Acute cholecystitis - status post laparoscopic cholecystectomy per     Dr. Freida Busman on Sep 25, 2010. 2. Volume depletion - resolved. 3. Presyncope - likely secondary to volume depletion in the above     setting. 4. Gastroesophageal reflux disease. 5. Hypokalemia - resolved. 6. Dyslipidemia. 7. Dementia. 8. History of complete heart block and status post pacemaker in the     past. 9. History of deep vein thrombosis and pulmonary embolism in the past. 10.Hypertension. 11.Hypothyroidism - with suppressed TSH, levothyroxine dose decreased     this hospital stay, follow up thyroid function tests in 4 to 6     weeks to further dose as appropriate.  CONSULTATIONS: 1. General Surgery - Dr. Freida Busman. 2. Cardiology - Dr. Myrtis Ser.  PROCEDURES AND STUDIES: 1. Chest x-ray on August 25, 2010 - low volumes with patchy perihilar     and bibasilar atelectasis.  Stable mild cardiomegaly.  CT scan of     abdomen and pelvis on Sep 24, 2010 - pneumobilia with gallstones     and pericholecystic fluid, however, the gallbladder wall does not     appear thickened.  There is no biliary dilatation.  The pneumobilia     may be from previous sphincterotomy but gas secondary to     cholangitis cannot be completely excluded.  If there is clinical     concern for acute cholecystitis, ultrasound or nuclear study may     prove helpful to further evaluate. 2. Abdominal ultrasound on Sep 24, 2010 - sludge and debris in the     lumen of the gallbladder with multiple probable tiny gallstones.     Gallbladder wall appears essentially  thickened and edematous.     Imaging features are suspicious for acute cholecystitis.  If     clinical picture is equivocal, nuclear study. 3. Abdominal x-ray on August 27, 2010 - nonobstructive bowel gas     pattern.  Retained contrast in nondistended colon. 4. Chest x-ray on Sep 27, 2010 - increased right lower lobe airspace     disease, most likely atelectasis.  BRIEF HISTORY:  The patient is an 77 year old white female with the above listed medical problems who presented with complaints of abdominal pain.  She reported that she had been having epigastric and right upper quadrant pain for 1 week prior to admission.  She also reported that she went to the bathroom that day and felt dizzy and had a near syncopal episode.  EMS was called and the patient was found to be hypotensive. When EMS arrived, the patient was brought to the ED.  In the ED, systolic blood pressure was in the 90s with a temperature of 100.  CT scan of abdomen and pelvis was done and the results as stated above. General Surgery  was consulted and they requested admission to the hospitalist service.  HOSPITAL COURSE: 1. Acute cholecystitis - as discussed above upon admission General     Surgery was consulted and Dr. Freida Busman saw the patient and initially     she discussed placing a percutaneous drainage versus surgery with     them.  She subsequently talked with Dr. Elease Hashimoto with [facility]     Cardiology who felt that the patient was fairly safe to undergo     cholecystectomy.  She was admitted to the hospitalist service and     started on antibiotics -  ertapenem and Cardiology was consulted     for cardiac clearance.  Dr. Myrtis Ser saw the patient and his impression     was that her overall cardiac status was stable and there was no     evidence of any unstable cardiac symptoms or sign of heart failure     and so she was cleared for the cholecystectomy.  Dr. Myrtis Ser     recommended to give careful attention to her volume status.      Following this, the patient was taken to surgery on Sep 25, 2010,     by Dr. Freida Busman and a laparoscopic cholecystectomy was done.  The     patient tolerated the procedure well.  Blood cultures had been done     and came back negative and then followed and subsequently the     antibiotics were discontinued.  The patient was started on a diet     which she tolerated and it was advanced to solids which she has     been tolerating at this time and Surgery followed and signed off     and recommended for the patient to follow up with Dr. Freida Busman in 2 to     3 weeks following discharge from the hospital. 2. Hypokalemia - her potassium was replaced during this hospital stay,     her last potassium today prior to discharge is 4.1. 3. Hypothyroidism - the patient had a TSH done upon admission and it     came back suppressed at 0.315.  The patient's Synthroid dose has     been decreased to 37.5 mcg and she is to continue this at the     Skilled Nursing Facility and have her thyroid function tests     rechecked in 4 to 6 weeks and further adjustment of her dose as     clinically appropriate per her outpatient physicians. 4. GERD - she was maintained on a PPI during this hospital stay. 5. Dyslipidemia - she is to continue her Pravachol upon discharge. 6. History of hypertension - the patient was on no medications on     admission and her blood pressures were monitored while she was in     the hospital and remained controlled off antihypertensives. 7. Deconditioning - the patient was evaluated by PT, OT during this     hospital stay and skilled nursing recommended and social work     assisted with her placement. 8. Dementia - the patient was maintained on her Aricept during this     hospital stay and she is to continue this upon discharge.  DISCHARGE MEDICATIONS: 1. Albuterol nebs 2.5 q.4 h p.r.n. 2. Vicodin 1 to 2 tablets q.4 h p.r.n. 3. Atrovent 0.5 mg q.4 h p.r.n. 4. Claritin 10 mg daily  p.r.n. 5. Zofran 4 mg p.o. q.6 h p.r.n. 6. MiraLax 17 g daily, hold if diarrhea. 7. Sudafed  120 mg p.o. b.i.d. p.r.n. 8. Aspirin 81 mg p.o. daily. 9. Calcium/vitamin D 600/400 p.o. daily. 10.Celexa 10 mg p.o. q.a.m. 11.Docusate  100 mg 1 p.o. b.i.d. 12.Benazepril 10 mg p.o. q.a.m. 13.Fish oil 1 g p.o. daily. 14.Fosamax 70 mg weekly. 15.Levothyroxine 37.5 mcg p.o. daily. 16.Mucinex 600 mg 1 p.o. b.i.d. p.r.n. 17.Multivitamins one p.o. q.a.m. 18.Namenda 10 mg p.o. daily. 19.Pravachol 20 mg p.o. q.h.s. 20.Prilosec 20 mg p.o. b.i.d. 21.Salicylate 750 mg 1 tablet in the morning and 2 tablets in the p.m. 22.Vitamin D3 1000 units 1 tablet daily.  DISCHARGE CONDITION:  Improved/stable.  FOLLOWUP CARE: 1. Nursing home physician in 1 to 2 days. 2. Dr. Freida Busman with [facility] in 2 to 3 weeks at the office, call     for appointment.     Kela Millin, M.D.     ACV/MEDQ  D:  09/28/2010  T:  09/28/2010  Job:  [id number]  cc:   Hillis Range, MD [address] [place] [address]  Dr. Freida Busman  Dr. Ronne Binning  Electronically Signed by Donnalee Curry M.D. on 10/11/2010 08:08:37 AM

## 2010-10-12 ENCOUNTER — Ambulatory Visit (HOSPITAL_COMMUNITY)
Admission: RE | Admit: 2010-10-12 | Discharge: 2010-10-12 | Disposition: A | Discharge status: Home / Self Care | Payer: Medicare Other | Source: Ambulatory Visit | Attending: General Surgery | Admitting: General Surgery

## 2010-10-12 ENCOUNTER — Other Ambulatory Visit (INDEPENDENT_AMBULATORY_CARE_PROVIDER_SITE_OTHER): Payer: Self-pay | Admitting: General Surgery

## 2010-10-12 DIAGNOSIS — M79609 Pain in unspecified limb: Secondary | ICD-10-CM | POA: Insufficient documentation

## 2010-10-12 DIAGNOSIS — M79606 Pain in leg, unspecified: Secondary | ICD-10-CM

## 2010-10-16 ENCOUNTER — Encounter: Payer: Self-pay | Admitting: Internal Medicine

## 2010-10-30 NOTE — Op Note (Signed)
NAME:  [name], [name] NO.:  [id number]  MEDICAL RECORD NO.:  [id number]           PATIENT TYPE:  I  LOCATION:  1423                         FACILITY:  [facility]  PHYSICIAN:  Lennie Muckle, MD      DATE OF BIRTH:  30-Apr-1923  DATE OF PROCEDURE:  09/25/2010 DATE OF DISCHARGE:                              OPERATIVE REPORT   PREOPERATIVE DIAGNOSIS:  Acute cholecystitis.  POSTOPERATIVE DIAGNOSIS:  Acute cholecystitis.  PROCEDURE:  Laparoscopic cholecystectomy.  SURGEON:  Lennie Muckle, M.D.  ASSISTANT:  OR staff.  FINDINGS:  Inflamed gallbladder with thickened gallbladder wall and pericholecystic fluid.  SPECIMENS:  Gallbladder.  ESTIMATED BLOOD LOSS:  Minimal.  ANESTHESIA:  General endotracheal anesthesia.  COMPLICATIONS:  No immediate complications.  INDICATIONS FOR THE PROCEDURE:  [name] is an 77 year old female who came in yesterday with the acute onset of abdominal pain.  She was found to have pericholecystic fluid, distended gallbladder, and elevated white count.  I believe that she had acute cholecystitis.  Due to her age, I obtained cardiac clearance and she was seen by the Hospitalist. She was deemed safe for surgery.  Therefore I discussed laparoscopic cholecystectomy with the patient and her family.  Informed consent was obtained.  She was on Invanz preoperatively.  DESCRIPTION OF THE PROCEDURE:  [name] was seen in the preoperative holding area by Anesthesia.  All questions were answered. She was then taken to the operating suite and placed in the supine position.  Sequential compression devices were applied to her lower extremity.  She had already had an indwelling Foley catheter.  After administration of general endotracheal anesthesia, her abdomen was prepped and draped in the usual sterile fashion.  A surgical time-out was performed.  I began by placing a 5-mm trocar in the right side of the abdomen.  All layers of the  abdominal wall were visualized with the Optiview.  I then insufflated the abdomen.  After obtaining adequate pneumoperitoneum, I inspected the abdomen and found no evidence of injury upon placement of the trocar.  I inspected towards the umbilical region.  There appeared to be a very small umbilical hernia.  I placed a 5-mm trocar superior to this.  I then placed the patient in the reverse Trendelenburg right side up position and placed an 11-mm trocar at the epigastric region and an additional 5-mm trocar in the right side of the abdomen under visualization with the camera.  The gallbladder was distended indicating acute cholecystitis.  There was pericholecystic fluid seen.  I used the __________ to decompress the gallbladder.  Dark bile was elicited.  I grasped the fundus of the gallbladder and retracted this to the head of the patient.  I retracted the infundibulum away from the liver bed.  Using the Maryland forceps and electrocautery, I obtained the critical view of the cystic duct and artery.  I could palpate no stones in the cystic duct.  I placed three clips proximally and one distally and transected it with laparoscopic scissors and I clipped and divided the cystic artery which had an anterior and posterior branch.  Once the artery was clipped and  divided, I continued dissecting the gallbladder from the gallbladder bed using electrocautery.  There was some spillage of bile and a few stones.  I continued removing the gallbladder and placed it in an EndoCatch bag.  I irrigated the abdomen with 3 liters of saline.  I removed the stones and irrigated until the irrigant was clear.  There was a small amount of oozing from the liver bed.  I used electrocautery to control this.  I did one final inspection of the liver bed.  There was no oozing, but I elected to place Surgicel within the wound bed.  I then inspected the abdomen and found no evidence of injury or bleeding and no  bilious drainage.  I then closed the fascial defect after removing the gallbladder from the epigastric port.  Once the fascia was closed, I injected 30 mL of 0.25% Marcaine at all sites for local anesthetic.  I removed pneumoperitoneum and removed the trocars.  The skin was closed with 4-0 Monocryl.  Dermabond was placed as final dressing.  The patient was extubated and transferred to the Post Anesthesia Care Unit in stable condition.  She will be monitored tonight and hopefully discharged home in the next day or so.     Lennie Muckle, MD     ALA/MEDQ  D:  09/25/2010  T:  09/25/2010  Job:  [id number]  Electronically Signed by Bertram Savin MD on 10/30/2010 12:09:55 PM

## 2010-10-30 NOTE — Consult Note (Signed)
NAME:  [name], [name] NO.:  [id number]  MEDICAL RECORD NO.:  [id number]           PATIENT TYPE:  E  LOCATION:  WLED                         FACILITY:  [facility]  PHYSICIAN:  Lennie Muckle, MD      DATE OF BIRTH:  [birth year]  DATE OF CONSULTATION:  09/24/2010 DATE OF DISCHARGE:                                CONSULTATION   REQUESTING PHYSICIAN:  Dr. Ethelda Chick.  REASON FOR CONSULT:  Question of cholecystitis and pneumobilia.  HISTORY OF PRESENT ILLNESS:  Ms. Glace is a pleasant 77 year old female with history of congestive heart failure, PE, reflux disease, dementia, pacemaker who presents with abdominal pain.  She began to have discomfort yesterday in the evening.  She states she was in usual state of health, began to have crampy abdominal pain.  It was worse after eating.  She had multiple episodes of nausea and vomiting last night. The pain did not radiate in her abdomen, but __________  vaguely in the upper abdomen.  She has not had any diarrhea.  No previous episodes of abdominal pain.  She did come to the ER due to fever and continued discomfort.  She also seemed to have more confusion per family.  In the emergency department, she had a temperature of 100.3.  CT scan revealed pneumobilia and pericholecystic fluid.  There were stones within the gallbladder.  There was no intrahepatic or extrahepatic ductal dilatation.  She had elevation of white count to 12.4.  PAST MEDICAL HISTORY:  Her past medical history is significant for history of DVT and history of PE, recent chronic urinary tract infection with chronic indwelling catheter, history of chronic diastolic heart failure with last echo last month with an EF greater than 55%.  Complete heart block, had a pacemaker insertion in December.  Osteoarthritis, reflux disease, hypertension, anxiety, hypothyroidism.  PAST SURGICAL HISTORY:  She had a bladder tack several years ago.  She has also had pacemaker  as well as left knee arthroplasty in 2006.  HOME MEDICATIONS:  Include: 1. Alendronate. 2. Calcium. 3. Citalopram. 4. Synthroid. 5. Vitamin D3. 6. Donepezil. 7. Fish oil. 8. Potassium. 9. Disalcid. 10.Multivitamin. 11.Omeprazole. 12.Namenda. 13.Easy-Lax. 14.Divalproex. 15.Pravachol. 16.Aspirin. 17.Mucinex.  ALLERGIES:  No known drug allergies.  REVIEW OF SYSTEMS:  As per the HPI.  FAMILY HISTORY:  Negative.  SOCIAL HISTORY:  She lives with her family.  She is married, has 2 daughters which are present at the time of examination.  No tobacco or alcohol use.  PHYSICAL EXAMINATION:  GENERAL:  On physical examination, she is pleasant elderly female, lying in a stretcher, in no acute distress. VITAL SIGNS:  Temperature is currently 100.1, blood pressure is 121/67, heart rate 77. HEENT EXAM:  Head is normocephalic.  Extraocular muscles are intact. Pupils are equal and round.  Sclerae and conjunctivae are clear.  Nares are clear without drainage.  Oral mucosa is pink and moist.  Dentition is good. NECK:  No tenderness.  Normal range of motion.  Thyroid without palpable abnormalities.  The trachea is midline. RESPIRATORY EXAM:  Clear to auscultation bilaterally.  Normal expansion/excursion. CARDIOVASCULAR EXAM:  Regular rate and rhythm.  No murmurs, gallops, or rubs.  Pulses palpated in the upper extremities.  No edema in the lower extremities. ABDOMEN:  Obese, soft, mildly distended.  She has minimal discomfort with palpation in the epigastric region.  No peritoneal signs.  No rebound tenderness. MUSCULOSKELETAL:  No deformities are seen.  No obvious swelling.  No pain to palpation of joints. SKIN EXAM:  No rashes, no jaundice.  Skin is warm to the touch.  LABORATORY DATA:  Hepatic function profile is normal.  CBC is mildly elevated at 12.4.  Hemoglobin and hematocrit of 15 and 44.  Platelets are 151.  BUN and creatinine of 19 and 0.9.  CT is reviewed.  There is fluid  around the gallbladder, no apparent gallbladder wall thickening or evidence of stones.  ASSESSMENT AND PLAN:  Likely cholecystitis with no evidence of cholangitis.  I have discussed preliminary with the patient and her family the possibility of placing a percutaneous drainage versus surgery.  I have already talked with Dr. Elease Hashimoto of Cardiology who feels she is fairly safe to undergo cholecystectomy.  I am going to wait to hear from the hospitalist regarding input into this.  She is currently comfortable.  We will cover with IV antibiotics for today.  No acute need for surgical intervention today.  However, hopefully we will have to plan on laparoscopic cholecystectomy tomorrow.     Lennie Muckle, MD     ALA/MEDQ  D:  09/24/2010  T:  09/24/2010  Job:  [id number]  cc:   Hillis Range, MD [address] [place] [address]  Dr. Redmond School  Electronically Signed by Bertram Savin MD on 10/30/2010 12:09:52 PM

## 2010-11-06 ENCOUNTER — Ambulatory Visit (INDEPENDENT_AMBULATORY_CARE_PROVIDER_SITE_OTHER): Payer: Medicare Other | Admitting: Internal Medicine

## 2010-11-06 ENCOUNTER — Encounter: Payer: Self-pay | Admitting: Internal Medicine

## 2010-11-06 VITALS — BP 90/62 | HR 82 | Resp 14 | Ht 64.0 in | Wt 144.0 lb

## 2010-11-06 DIAGNOSIS — I442 Atrioventricular block, complete: Secondary | ICD-10-CM

## 2010-11-06 DIAGNOSIS — I4891 Unspecified atrial fibrillation: Secondary | ICD-10-CM

## 2010-11-06 DIAGNOSIS — M549 Dorsalgia, unspecified: Secondary | ICD-10-CM

## 2010-11-06 DIAGNOSIS — I959 Hypotension, unspecified: Secondary | ICD-10-CM | POA: Insufficient documentation

## 2010-11-06 NOTE — Progress Notes (Signed)
The patient presents today for routine electrophysiology followup.  Since last being seen in our clinic, the patient reports doing reasonably well.  She reports "pulling a muscle" in her L lower back 3 days ago when walking with her spouse.  She has had mild L sided backpain which continues to improve.  She denies urinary symptoms.  She also has mild R leg edema but reports noncompliance with salt restriction.  Her Spouse is also concerned about chronic hypotension since starting benazepril.   Today, she denies symptoms of palpitations, chest pain, shortness of breath, orthopnea, PND, dizziness, presyncope, syncope, or neurologic sequela.  The patient feels that she is tolerating medications without difficulties and is otherwise without complaint today.   Past Medical History  Diagnosis Date  . Arthritis   . Depression   . Hyperlipidemia   . Hypothyroidism   . HB (heart block)     s/p PPM, most recent generator change 12/11 by JA  . Dementia   . Hiatal hernia   . PTE (post-transplant erythrocytosis)     prior DVT 4/11 perviously on coudmadin, recently stopped by Dr. Deborah Chalk due to labile INRS   . Atrial fibrillation    Past Surgical History  Procedure Date  . Hemorrhoid surgery   . Pacemaker     most recent generator 12/11 by JA  . Hysterectomy (other)     Current Outpatient Prescriptions  Medication Sig Dispense Refill  . alendronate (FOSAMAX) 70 MG tablet Take 70 mg by mouth every 7 (seven) days. Take with a full glass of water on an empty stomach.       [name] aspirin 81 MG tablet Take 81 mg by mouth daily.        . benazepril (LOTENSIN) 5 MG tablet Take 5 mg by mouth daily.        . Calcium Carbonate (CALCIUM-CARB 600 PO) Take by mouth daily.        . cholecalciferol (VITAMIN D) 1000 UNITS tablet Take 1,000 Units by mouth as directed.        . citalopram (CELEXA) 10 MG tablet Take 10 mg by mouth daily.        [name] docusate sodium (COLACE) 100 MG capsule Take 100 mg by mouth 2 (two) times  daily.        [name] levothyroxine (SYNTHROID, LEVOTHROID) 50 MCG tablet Take 50 mcg by mouth daily.        . memantine (NAMENDA) 10 MG tablet Take 10 mg by mouth 2 (two) times daily.        . Multiple Vitamin (MULTIVITAMIN) capsule Take 1 capsule by mouth daily.        [name] omeprazole (PRILOSEC) 20 MG capsule Take 20 mg by mouth daily.        . pravastatin (PRAVACHOL) 20 MG tablet Take 20 mg by mouth daily.        . salsalate (DISALCID) 750 MG tablet Take 750 mg by mouth 2 (two) times daily.        [name] DISCONTD: divalproex (DEPAKOTE) 125 MG EC tablet Take 125 mg by mouth 3 (three) times daily.        [name] DISCONTD: donepezil (ARICEPT) 10 MG tablet Take 10 mg by mouth daily.        [name] DISCONTD: Levothyroxine Sodium 75 MCG CAPS Take by mouth daily.        [name] DISCONTD: Omega-3 Fatty Acids (FISH OIL) 1000 MG CAPS Take by mouth daily.        [name]  DISCONTD: potassium chloride SA (K-DUR,KLOR-CON) 20 MEQ tablet Take 20 mEq by mouth daily.        [name] DISCONTD: solifenacin (VESICARE) 5 MG tablet Take 10 mg by mouth daily.          No Known Allergies  History   Social History  . Marital Status: Married    Spouse Name: N/A    Number of Children: N/A  . Years of Education: N/A   Occupational History  . Not on file.   Social History Main Topics  . Smoking status: Never Smoker   . Smokeless tobacco: Not on file  . Alcohol Use: No  . Drug Use: No  . Sexually Active: Not on file   Other Topics Concern  . Not on file   Social History Narrative   Lives at home with spouse in [place]. Denies ETOH.     Physical Exam: Filed Vitals:   11/06/10 1131  BP: 90/62  Pulse: 82  Resp: 14  Height: 5\' 4"  (1.626 m)  Weight: 144 lb (65.318 kg)    GEN- The patient is well appearing, alert and oriented x 3 today.   Head- normocephalic, atraumatic Eyes-  Sclera clear, conjunctiva pink Ears- hearing intact Oropharynx- clear Neck- supple, no JVP Lymph- no cervical lymphadenopathy Lungs- Clear to ausculation  bilaterally, normal work of breathing Chest- pacemaker pocket is well healed Heart- Regular rate and rhythm, no murmurs, rubs or gallops, PMI not laterally displaced GI- soft, NT, ND, + BS Extremities- no clubbing, cyanosis, trace R leg edema, no homans or cords MS- no significant deformity or atrophy, mild TTP over the L posterior thoracic region which reproduces her complaint, no CVA ttp Skin- no rash or lesion Psych- euthymic mood, full affect Neuro- strength and sensation are intact  Pacemaker interrogation- reviewed in detail today,  See PACEART report  Assessment and Plan:

## 2010-11-06 NOTE — Assessment & Plan Note (Signed)
Normal pacemaker function See Pace Art report No changes today  

## 2010-11-06 NOTE — Assessment & Plan Note (Signed)
Stable Per her family (and previously Dr Deborah Chalk) she is a poor candidate for coumadin No changes today

## 2010-11-06 NOTE — Assessment & Plan Note (Signed)
Tylenol prn Contact Dr Redmond School if not improved.

## 2010-11-06 NOTE — Patient Instructions (Signed)
Your physician wants you to follow-up in: Dec with Dr Johney Frame Bonita Quin will receive a reminder letter in the mail two months in advance. If you don't receive a letter, please call our office to schedule the follow-up appointment.  Watch the [employer] in your diet

## 2010-11-06 NOTE — Assessment & Plan Note (Signed)
Stop benazepril and follow BP closely at home If BP becomes elevated, restart benazepril 2.5mg  daily

## 2010-12-15 ENCOUNTER — Other Ambulatory Visit (INDEPENDENT_AMBULATORY_CARE_PROVIDER_SITE_OTHER): Payer: Medicare Other

## 2010-12-15 ENCOUNTER — Ambulatory Visit (INDEPENDENT_AMBULATORY_CARE_PROVIDER_SITE_OTHER): Payer: Medicare Other | Admitting: Internal Medicine

## 2010-12-15 ENCOUNTER — Encounter: Payer: Self-pay | Admitting: Internal Medicine

## 2010-12-15 VITALS — BP 118/74 | HR 83 | Ht 61.0 in | Wt 143.6 lb

## 2010-12-15 DIAGNOSIS — J42 Unspecified chronic bronchitis: Secondary | ICD-10-CM

## 2010-12-15 LAB — CBC WITH DIFFERENTIAL/PLATELET
Basophils Absolute: 0.1 10*3/uL (ref 0.0–0.1)
Basophils Relative: 1.1 % (ref 0.0–3.0)
Eosinophils Absolute: 0.2 10*3/uL (ref 0.0–0.7)
Eosinophils Relative: 2.6 % (ref 0.0–5.0)
HCT: 43.8 % (ref 36.0–46.0)
Hemoglobin: 14.6 g/dL (ref 12.0–15.0)
Lymphocytes Relative: 35.3 % (ref 12.0–46.0)
Lymphs Abs: 3.2 10*3/uL (ref 0.7–4.0)
MCHC: 33.2 g/dL (ref 30.0–36.0)
MCV: 96.6 fl (ref 78.0–100.0)
Monocytes Absolute: 0.6 10*3/uL (ref 0.1–1.0)
Monocytes Relative: 6.4 % (ref 3.0–12.0)
Neutro Abs: 4.9 10*3/uL (ref 1.4–7.7)
Neutrophils Relative %: 54.6 % (ref 43.0–77.0)
Platelets: 230 10*3/uL (ref 150.0–400.0)
RBC: 4.54 Mil/uL (ref 3.87–5.11)
RDW: 15.1 % — ABNORMAL HIGH (ref 11.5–14.6)
WBC: 8.9 10*3/uL (ref 4.5–10.5)

## 2010-12-15 LAB — BASIC METABOLIC PANEL
BUN: 19 mg/dL (ref 6–23)
CO2: 28 mEq/L (ref 19–32)
Calcium: 9.5 mg/dL (ref 8.4–10.5)
Chloride: 103 mEq/L (ref 96–112)
Creatinine, Ser: 0.9 mg/dL (ref 0.4–1.2)
GFR: 63.97 mL/min (ref >60.00)
Glucose, Bld: 93 mg/dL (ref 70–99)
Potassium: 4.5 mEq/L (ref 3.5–5.1)
Sodium: 141 mEq/L (ref 135–145)

## 2010-12-15 MED ORDER — DOXYCYCLINE HYCLATE 100 MG PO TABS: ORAL_TABLET | ORAL | Status: AC

## 2010-12-15 NOTE — Progress Notes (Signed)
  Subjective:    Patient ID: [name], female    DOB: 1922-06-03, 77 y.o.   MRN: [id number]  HPI 12/15/10- 17 yoF never smoker seen in pulmonary consultation   Review of Systems  HENT: Positive for congestion and postnasal drip.   Respiratory: Positive for cough.        Objective:   Physical Exam        Assessment & Plan:

## 2010-12-15 NOTE — Assessment & Plan Note (Addendum)
Thjis sounds like a bronchitis after pneumonia, complicated by postnasal drip. Note hx of GERD and interval in May when she was on benazepril/ ACEI. They insist she is no longer taking benazepril, which is a common cause of cough.  Plan - pending arrival of records from Dr Redmond School, including recent CXR done at her home, we will start doxycycline and an antihistmine.

## 2010-12-15 NOTE — Patient Instructions (Addendum)
Try daily loratadine. Claritin antihistamine for the drainage  Script sent for antibiotic doxycycline  Order- lab-- CBC w/ diff, BMET  Need to verify no longer taking benazepril or any ACE inhibitor, since these medicines often cause difficult, persistent cough.

## 2010-12-17 NOTE — Progress Notes (Signed)
Subjective:    Patient ID: [name], female    DOB: 12-08-1922, 77 y.o.   MRN: [id number]  HPI 12/15/10-this 77 year old woman comes with her husband and daughter in pulmonary consultation at the request of Dr. Redmond School, concerned about cough and fatigue. She is a never smoker being managed by a neurologist the family cannot name, for a neurologic condition they aren't sure of the name of. Together they describe cough new since June of 2012, associated with postnasal drip. She was in assisted living while her daughter was on vacation, and got pneumonia there. Pneumonia was diagnosed by chest x-ray and treated with Levaquin which ended recently. She is bothered especially at night by a sense of postnasal drainage and productive cough. She has tried sleeping on a wedge. No prior history of lung disease except occasional bronchitis. She did have a bronchitis in April of 2011 requiring hospital stay. Some choking with food or drink, felt as a tickle in her throat. Past history of reflux and esophageal dilatation but she does not recognize active reflux. Tussive left rib pain. Mild shortness of breath is not exertional. Cough wakes her at night. No exposure to tuberculosis. Never smoked. Has had Pneumovax at least once, and annual flu vaccine. A note from a home medical visit is available from [facility] Chest x-ray dated 12/04/2010 reports clear chest normal heart size with pacemaker and no active process.  Review of Systems Constitutional:   No-   weight loss, night sweats, fevers, chills,  HEENT:   No-   headaches, difficulty swallowing, tooth/dental problems, sore throat,                  No-   sneezing, itching, ear ache, nasal congestion, post nasal drip,   CV:    +chest pain, No-orthopnea, PND, swelling in lower extremities, anasarca, dizziness, palpitations  GI:  No-   heartburn, indigestion, abdominal pain, nausea, vomiting, diarrhea,                 change in bowel  habits, loss of appetite  Resp: No-   shortness of breath with exertion or at rest.  No-  excess mucus,            No-  coughing up of blood.              No-   change in color of mucus.  No- wheezing.    Skin: No-   rash or lesions.  GU: No-   dysuria, change in color of urine, no urgency or frequency.  No- flank pain.  MS:  No-   joint pain or swelling.  No- decreased range of motion.  No- back pain.  Psych:  No- change in mood or affect. No depression or anxiety.  No memory loss.      Objective:   Physical Exam General- Alert, Oriented, Affect-appropriate, Distress- none acute  Very hard of hearing and slow to respond, but seems oriented Skin- rash-none, lesions- none, excoriation- none Lymphadenopathy- none Head- atraumatic            Eyes- Gross vision intact, PERRLA, conjunctivae clear secretions            Ears- Hearing, diminished            Nose- Clear, No-Septal dev, mucus, polyps, erosion, perforation             Throat- Mallampati II , mucosa clear , drainage- none, tonsils- atrophic Neck- flexible ,  trachea midline, no stridor , thyroid nl, carotid no bruit Chest - symmetrical excursion , unlabored           Heart/CV- RRR , no murmur , no gallop  , no rub, nl s1 s2                           - JVD- none , edema- none, stasis changes- none, varices- none           Lung- +crackles right greater than left chest,            wheeze- none, cough- none while here , dullness-none, rub- none           Chest wall-  Abd- tender-no, distended-no, bowel sounds-present, HSM- no Br/ Gen/ Rectal- Not done, not indicated Extrem- cyanosis- none, clubbing, none, atrophy- none, strength- reduced. Using a walker. Neuro- grossly intact to observation         Assessment & Plan:

## 2010-12-25 ENCOUNTER — Other Ambulatory Visit: Payer: Self-pay | Admitting: *Deleted

## 2010-12-25 ENCOUNTER — Telehealth: Payer: Self-pay | Admitting: Internal Medicine

## 2010-12-25 MED ORDER — HYDROCOD POLST-CHLORPHEN POLST 10-8 MG/5ML PO LQCR: ORAL | Status: DC

## 2010-12-25 NOTE — Progress Notes (Signed)
Quick Note:  Pt aware of results via husband. ______

## 2010-12-25 NOTE — Telephone Encounter (Signed)
Spoke with Mrs. [name] and she states pt is still having a cough and this has been going on since June. Mrs. [name] would like pt to have tussionex called in for her cough b/c this has helped in the past. Please advise Dr. Maple Hudson if okay to do. Thanks  No Known Allergies  [name], CMA

## 2010-12-25 NOTE — Telephone Encounter (Signed)
rx for Tussionex faxed to [facility] @ [place] Per Dr Maple Hudson.Ms Lanae Boast informed.Kandice Hams

## 2010-12-28 ENCOUNTER — Encounter: Payer: Self-pay | Admitting: Internal Medicine

## 2011-01-17 ENCOUNTER — Encounter: Payer: Self-pay | Admitting: Internal Medicine

## 2011-01-17 ENCOUNTER — Ambulatory Visit (INDEPENDENT_AMBULATORY_CARE_PROVIDER_SITE_OTHER)
Admission: RE | Admit: 2011-01-17 | Discharge: 2011-01-17 | Disposition: A | Discharge status: Home / Self Care | Payer: Medicare Other | Source: Ambulatory Visit | Attending: Internal Medicine | Admitting: Internal Medicine

## 2011-01-17 ENCOUNTER — Ambulatory Visit (INDEPENDENT_AMBULATORY_CARE_PROVIDER_SITE_OTHER): Payer: Medicare Other | Admitting: Internal Medicine

## 2011-01-17 VITALS — BP 122/78 | HR 80 | Ht 61.0 in | Wt 142.0 lb

## 2011-01-17 DIAGNOSIS — J40 Bronchitis, not specified as acute or chronic: Secondary | ICD-10-CM

## 2011-01-17 DIAGNOSIS — Z23 Encounter for immunization: Secondary | ICD-10-CM

## 2011-01-17 DIAGNOSIS — J310 Chronic rhinitis: Secondary | ICD-10-CM | POA: Insufficient documentation

## 2011-01-17 DIAGNOSIS — J42 Unspecified chronic bronchitis: Secondary | ICD-10-CM

## 2011-01-17 MED ORDER — MOMETASONE FUROATE 50 MCG/ACT NA SUSP: 2.0000 | Freq: Every day | NASAL | Status: DC

## 2011-01-17 NOTE — Progress Notes (Signed)
Subjective:    Patient ID: [name], female    DOB: Nov 30, 1922, 77 y.o.   MRN: [id number]  HPI    Review of Systems     Objective:   Physical Exam        Assessment & Plan:   Subjective:    Patient ID: [name], female    DOB: July 05, 1922, 77 y.o.   MRN: [id number]  HPI 12/15/10-this 77 year old woman comes with her husband and daughter in pulmonary consultation at the request of Dr. Redmond School, concerned about cough and fatigue. She is a never smoker being managed by a neurologist the family cannot name, for a neurologic condition they aren't sure of the name of. Together they describe cough new since June of 2012, associated with postnasal drip. She was in assisted living while her daughter was on vacation, and got pneumonia there. Pneumonia was diagnosed by chest x-ray and treated with Levaquin which ended recently. She is bothered especially at night by a sense of postnasal drainage and productive cough. She has tried sleeping on a wedge. No prior history of lung disease except occasional bronchitis. She did have a bronchitis in April of 2011 requiring hospital stay. Some choking with food or drink, felt as a tickle in her throat. Past history of reflux and esophageal dilatation but she does not recognize active reflux. Tussive left rib pain. Mild shortness of breath is not exertional. Cough wakes her at night. No exposure to tuberculosis. Never smoked. Has had Pneumovax at least once, and annual flu vaccine. A note from a home medical visit is available from [facility] Chest x-ray dated 12/04/2010 reports clear chest normal heart size with pacemaker and no active process.  01/17/11-77 year old female never smoker Returns to followup pulmonary consultation at the request of Dr. Redmond School, concerned about cough and fatigue. Complicated by neuropathic weakness, pacemaker/CHB/paroxysmal atrial fib.  Family here Her neurologist added Kepra. They still aren't  clear of the dx, etiology or course. She is getting better, clearer and more active in the home.  She feels she is better. Much less cough. She still coughs at night, but it helps to give 1/4 tsp Tussionex at bedtime. They ask why she still has the cough. Notes new hiccups with eating.  CXR single view July 23 done mobile at home saw pacemaker but no acute process.  They have found that daily loratadine reduces her postnasal drip.   Review of Systems Constitutional:   No-   weight loss, night sweats, fevers, chills,  HEENT:   No-   headaches, difficulty swallowing, tooth/dental problems, sore throat,                  No-   sneezing, itching, ear ache, nasal congestion, post nasal drip,  CV:    +chest pain, No-orthopnea, PND, swelling in lower extremities, anasarca, dizziness, palpitations GI:  No-   heartburn, indigestion, abdominal pain, nausea, vomiting, diarrhea,                 change in bowel habits, loss of appetite Resp: No-   shortness of breath with exertion or at rest.  No-  excess mucus,            No-  coughing up of blood.              No-   change in color of mucus.  No- wheezing.   Skin: No-   rash or lesions. GU: No-   dysuria, change  in color of urine, no urgency or frequency.  No- flank pain. MS:  No-   joint pain or swelling.  No- decreased range of motion.  No- back pain. Psych:  No- change in mood or affect. No depression or anxiety.  No memory loss.      Objective:   Physical Exam General- Alert, Oriented, Affect-appropriate, Distress- none acute  More alert and interactive,  seems oriented Skin- rash-none, lesions- none, excoriation- none Lymphadenopathy- none Head- atraumatic            Eyes- Gross vision intact, PERRLA, conjunctivae clear secretions            Ears- Hearing, diminished            Nose- Clear, No-Septal dev, mucus, polyps, erosion, perforation             Throat- Mallampati II , mucosa clear , drainage- none, tonsils- atrophic Neck- flexible ,  trachea midline, no stridor , thyroid nl, carotid no bruit Chest - symmetrical excursion , unlabored           Heart/CV- RRR , no murmur , no gallop  , no rub, nl s1 s2                           - JVD- none , edema- none, stasis changes- none, varices- none           Lung- +crackles right greater than left chest,            wheeze- none, cough- none while here , dullness-none, rub- none           Chest wall-  Abd- tender-no, distended-no, bowel sounds-present, HSM- no Br/ Gen/ Rectal- Not done, not indicated Extrem- cyanosis- none, clubbing, none, atrophy- none, strength- reduced. Using a walker. Neuro- grossly intact to observation         Assessment & Plan:

## 2011-01-17 NOTE — Patient Instructions (Signed)
Continue Tussionex at bedtime if needed  Continue Claritin/ loratadine once daily- suggest taking this in the morning  Add Nasonex sample/ script-    2 puffs each nostril once daily at bedtime.   Order- CXR- dx bronchitis/ cough

## 2011-01-18 ENCOUNTER — Other Ambulatory Visit: Payer: Self-pay | Admitting: Internal Medicine

## 2011-01-18 DIAGNOSIS — R918 Other nonspecific abnormal finding of lung field: Secondary | ICD-10-CM

## 2011-01-18 DIAGNOSIS — R059 Cough, unspecified: Secondary | ICD-10-CM

## 2011-01-18 DIAGNOSIS — R05 Cough: Secondary | ICD-10-CM

## 2011-01-18 NOTE — Progress Notes (Signed)
Quick Note:  Spoke with patients spouse and they agreed to have CT-will bring patient by the office on Friday 01-19-2011 to have labs drawn. ______

## 2011-01-19 ENCOUNTER — Other Ambulatory Visit (INDEPENDENT_AMBULATORY_CARE_PROVIDER_SITE_OTHER): Payer: Medicare Other

## 2011-01-19 ENCOUNTER — Ambulatory Visit (INDEPENDENT_AMBULATORY_CARE_PROVIDER_SITE_OTHER)
Admission: RE | Admit: 2011-01-19 | Discharge: 2011-01-19 | Disposition: A | Discharge status: Home / Self Care | Payer: Medicare Other | Source: Ambulatory Visit | Attending: Internal Medicine | Admitting: Internal Medicine

## 2011-01-19 DIAGNOSIS — R918 Other nonspecific abnormal finding of lung field: Secondary | ICD-10-CM

## 2011-01-19 DIAGNOSIS — R05 Cough: Secondary | ICD-10-CM

## 2011-01-19 DIAGNOSIS — R059 Cough, unspecified: Secondary | ICD-10-CM

## 2011-01-19 DIAGNOSIS — R222 Localized swelling, mass and lump, trunk: Secondary | ICD-10-CM

## 2011-01-19 LAB — BASIC METABOLIC PANEL
BUN: 23 mg/dL (ref 6–23)
CO2: 28 mEq/L (ref 19–32)
Calcium: 9.4 mg/dL (ref 8.4–10.5)
Chloride: 104 mEq/L (ref 96–112)
Creatinine, Ser: 0.9 mg/dL (ref 0.4–1.2)
GFR: 63.96 mL/min (ref >60.00)
Glucose, Bld: 114 mg/dL — ABNORMAL HIGH (ref 70–99)
Potassium: 3.9 mEq/L (ref 3.5–5.1)
Sodium: 139 mEq/L (ref 135–145)

## 2011-01-19 MED ORDER — IOHEXOL 300 MG/ML  SOLN
80.0000 mL | Freq: Once | INTRAMUSCULAR | Status: AC | PRN
  Administered 2011-01-19: 80 mL via INTRAVENOUS

## 2011-01-20 NOTE — Assessment & Plan Note (Signed)
We will continue loratadine and add a nasal steroid spray

## 2011-01-20 NOTE — Assessment & Plan Note (Addendum)
Improving cough. Probably this has been a postinflammatory bronchitis with high probability that when her neuropathy was worse, she was having microaspiration. Previous chest x-ray was single view. Now that she is more mobile I would like to get today PA and lateral view.

## 2011-01-22 ENCOUNTER — Telehealth: Payer: Self-pay | Admitting: Internal Medicine

## 2011-01-22 NOTE — Telephone Encounter (Signed)
CT chest- there is some scarring in the right middle lung and both bases, but nothing that looks active. No concerns  I spoke with patient about results and he verbalized understanding and had no questions

## 2011-02-12 ENCOUNTER — Ambulatory Visit (INDEPENDENT_AMBULATORY_CARE_PROVIDER_SITE_OTHER): Payer: Medicare Other | Admitting: Physician Assistant

## 2011-02-12 ENCOUNTER — Encounter: Payer: Self-pay | Admitting: Physician Assistant

## 2011-02-12 ENCOUNTER — Other Ambulatory Visit: Payer: Self-pay

## 2011-02-12 ENCOUNTER — Encounter: Payer: Self-pay | Admitting: Internal Medicine

## 2011-02-12 ENCOUNTER — Encounter: Payer: Medicare Other | Admitting: *Deleted

## 2011-02-12 DIAGNOSIS — R079 Chest pain, unspecified: Secondary | ICD-10-CM

## 2011-02-12 DIAGNOSIS — R0789 Other chest pain: Secondary | ICD-10-CM | POA: Insufficient documentation

## 2011-02-12 DIAGNOSIS — I4891 Unspecified atrial fibrillation: Secondary | ICD-10-CM

## 2011-02-12 DIAGNOSIS — I442 Atrioventricular block, complete: Secondary | ICD-10-CM

## 2011-02-12 DIAGNOSIS — R5381 Other malaise: Secondary | ICD-10-CM

## 2011-02-12 DIAGNOSIS — R5383 Other fatigue: Secondary | ICD-10-CM | POA: Insufficient documentation

## 2011-02-12 NOTE — Patient Instructions (Signed)
Your physician recommends that you schedule a follow-up appointment in: 1 MONTH WITH DR. ALLRED PER SCOTT WEAVER, PA-C  Your physician recommends that you return for lab work in: TODAY BMET, CBC W/DIFF, TSH 786.50, 427.31  Your physician has requested that you have a lexiscan myoview DX 786.50 CHEST PAIN. For further information please visit [email or web address] Please follow instruction sheet, as given.

## 2011-02-12 NOTE — Assessment & Plan Note (Signed)
Check a CBC, TSH and basic metabolic panel.

## 2011-02-12 NOTE — Assessment & Plan Note (Signed)
She had some left arm and chest pain recently.  Her ECG is paced.  She does have some risk factors for coronary disease.  I will have her undergo a Myoview.  Followup with Dr. Johney Frame in one month.

## 2011-02-12 NOTE — Assessment & Plan Note (Signed)
Maintaining sinus rhythm.  She is not a Coumadin candidate.  She remains on aspirin.

## 2011-02-12 NOTE — Progress Notes (Signed)
History of Present Illness: Primary Electrophysiologist:  Dr. Hillis Range   [name] is a 77 y.o. female presents for chest and shoulder pain.  She has a history of complete heart block, status post pacemaker implantation, hypertension, hyperlipidemia, hypothyroidism, atrial fibrillation and prior DVT and pulmonary embolism.  She has been deemed a poor candidate for Coumadin and this has been discontinued.  Last echocardiogram 4/11: EF 55-60%, grade 1 diastolic dysfunction, mild MR, mild-moderate TR and PAS P. 42.  She recently reported left shoulder pain and was asked to follow up today. This occurred several weeks ago.  She was lying in bed at the time.  She points to her left chest and left shoulder.  She cannot really describe the pain.  It lasted for several hours.  It didn't radiate down her left arm.  She denies any recurrence.  She denies significant shortness of breath.  She ambulates with a walker.  She probably describes class IIb to 3 symptoms.  He denies orthopnea, PND or edema.  She denies palpitations.  She denies syncope.  She does report significant fatigue.  She was worried that her pacemaker was not functioning appropriately.  We had interrogated today and is functioning appropriately.  Past Medical History  Diagnosis Date  . Arthritis   . Depression   . Hyperlipidemia   . Hypothyroidism   . HB (heart block)     s/p PPM, most recent generator change 12/11 by JA  . Dementia   . Hiatal hernia   . PTE (post-transplant erythrocytosis)     prior DVT 4/11 perviously on coudmadin, recently stopped by Dr. Deborah Chalk due to labile INRS   . Atrial fibrillation     Current Outpatient Prescriptions  Medication Sig Dispense Refill  . alendronate (FOSAMAX) 70 MG tablet Take 70 mg by mouth every 7 (seven) days. Take with a full glass of water on an empty stomach.      [name] aspirin 81 MG tablet Take 81 mg by mouth daily.        . Calcium Carbonate (CALCIUM-CARB 600 PO) Take by mouth  daily.        . chlorpheniramine-HYDROcodone (TUSSIONEX PENNKINETIC ER) 10-8 MG/5ML LQCR 1 tsp every 12 hours as needed for cough  250 mL  0  . cholecalciferol (VITAMIN D) 1000 UNITS tablet Take 1,000 Units by mouth as directed.        . citalopram (CELEXA) 10 MG tablet Take 10 mg by mouth daily.        [name] docusate sodium (COLACE) 100 MG capsule Take 100 mg by mouth 4 (four) times daily.       [name] donepezil (ARICEPT) 10 MG tablet Take 10 mg by mouth daily.       [name] levETIRAcetam (KEPPRA) 250 MG tablet Take 125 mg by mouth at bedtime.        [name] levothyroxine (SYNTHROID, LEVOTHROID) 50 MCG tablet Take 50 mcg by mouth daily.        [name] loratadine (CLARITIN) 10 MG tablet Take 10 mg by mouth daily.        . memantine (NAMENDA) 10 MG tablet Take 10 mg by mouth 2 (two) times daily.        . mometasone (NASONEX) 50 MCG/ACT nasal spray Place 2 sprays into the nose daily. At bedtime  17 g  2  . Multiple Vitamin (MULTIVITAMIN) capsule Take 1 capsule by mouth daily.        [name] NITROSTAT 0.4 MG SL tablet       .  omeprazole (PRILOSEC) 20 MG capsule Take 20 mg by mouth 2 (two) times daily.       . pravastatin (PRAVACHOL) 20 MG tablet Take 20 mg by mouth daily.        . salsalate (DISALCID) 750 MG tablet Take 750 mg by mouth 2 (two) times daily.          Allergies: No Known Allergies  Social history:  Nonsmoker  ROS:  Please see the history of present illness.  All other systems reviewed and negative.   Vital Signs: BP 134/78  Pulse 79  Ht 5\' 2"  (1.575 m)  Wt 141 lb (63.957 kg)  BMI 25.79 kg/m2  SpO2 95%  PHYSICAL EXAM: Well nourished, well developed, in no acute distress HEENT: normal Neck: no JVD Vascular: No carotid bruits Cardiac:  normal S1, S2; RRR; no murmur Lungs:  clear to auscultation bilaterally, no wheezing, rhonchi or rales Abd: soft, nontender, no hepatomegaly Ext: no edema, Calves soft, no palpable cords Skin: warm and dry Neuro:  CNs 2-12 intact, no focal abnormalities noted Psych:  Normal affect  EKG:  Sinus rhythm, V paced, heart rate 79  ASSESSMENT AND PLAN:

## 2011-02-13 LAB — BASIC METABOLIC PANEL
BUN: 21 mg/dL (ref 6–23)
CO2: 27 mEq/L (ref 19–32)
Calcium: 9.6 mg/dL (ref 8.4–10.5)
Chloride: 107 mEq/L (ref 96–112)
Creatinine, Ser: 0.9 mg/dL (ref 0.4–1.2)
GFR: 66.53 mL/min (ref >60.00)
Glucose, Bld: 89 mg/dL (ref 70–99)
Potassium: 4.4 mEq/L (ref 3.5–5.1)
Sodium: 142 mEq/L (ref 135–145)

## 2011-02-13 LAB — CBC WITH DIFFERENTIAL/PLATELET
Basophils Absolute: 0.1 10*3/uL (ref 0.0–0.1)
Basophils Relative: 1.2 % (ref 0.0–3.0)
Eosinophils Absolute: 0.1 10*3/uL (ref 0.0–0.7)
Eosinophils Relative: 0.9 % (ref 0.0–5.0)
HCT: 44.7 % (ref 36.0–46.0)
Hemoglobin: 14.8 g/dL (ref 12.0–15.0)
Lymphocytes Relative: 46.5 % — ABNORMAL HIGH (ref 12.0–46.0)
Lymphs Abs: 3.3 10*3/uL (ref 0.7–4.0)
MCHC: 33.2 g/dL (ref 30.0–36.0)
MCV: 94.5 fl (ref 78.0–100.0)
Monocytes Absolute: 0.4 10*3/uL (ref 0.1–1.0)
Monocytes Relative: 6.1 % (ref 3.0–12.0)
Neutro Abs: 3.2 10*3/uL (ref 1.4–7.7)
Neutrophils Relative %: 45.3 % (ref 43.0–77.0)
Platelets: 198 10*3/uL (ref 150.0–400.0)
RBC: 4.73 Mil/uL (ref 3.87–5.11)
RDW: 14.6 % (ref 11.5–14.6)
WBC: 7.2 10*3/uL (ref 4.5–10.5)

## 2011-02-13 LAB — TSH: TSH: 0.43 u[IU]/mL (ref 0.35–5.50)

## 2011-02-14 LAB — PACEMAKER DEVICE OBSERVATION
AL AMPLITUDE: 2 mv
AL IMPEDENCE PM: 633 Ohm
AL THRESHOLD: 0.75 V
ATRIAL PACING PM: 1
BAMS-0001: 150 {beats}/min
BATTERY VOLTAGE: 2.8 V
DEVICE MODEL PM: 243410
RV LEAD IMPEDENCE PM: 903 Ohm
RV LEAD THRESHOLD: 1.25 V
VENTRICULAR PACING PM: 100

## 2011-02-21 ENCOUNTER — Ambulatory Visit (HOSPITAL_COMMUNITY): Payer: Medicare Other | Attending: Internal Medicine | Admitting: Radiology

## 2011-02-21 VITALS — Ht 62.0 in | Wt 142.0 lb

## 2011-02-21 DIAGNOSIS — I447 Left bundle-branch block, unspecified: Secondary | ICD-10-CM

## 2011-02-21 DIAGNOSIS — R079 Chest pain, unspecified: Secondary | ICD-10-CM | POA: Insufficient documentation

## 2011-02-21 MED ORDER — TECHNETIUM TC 99M TETROFOSMIN IV KIT
11.0000 | PACK | Freq: Once | INTRAVENOUS | Status: AC | PRN
  Administered 2011-02-21: 11 via INTRAVENOUS

## 2011-02-21 MED ORDER — ADENOSINE (DIAGNOSTIC) 3 MG/ML IV SOLN
0.5600 mg/kg | Freq: Once | INTRAVENOUS | Status: AC
  Administered 2011-02-21: 36 mg via INTRAVENOUS

## 2011-02-21 MED ORDER — TECHNETIUM TC 99M TETROFOSMIN IV KIT
33.0000 | PACK | Freq: Once | INTRAVENOUS | Status: AC | PRN
  Administered 2011-02-21: 33 via INTRAVENOUS

## 2011-02-21 NOTE — Progress Notes (Signed)
[facility] SITE 3 NUCLEAR MED [address] [place] [address] [phone]  Cardiology Nuclear Med Study  [name] is a 77 y.o. female [id number] 02-28-1923   Nuclear Med Background Indication for Stress Test:  Evaluation for Ischemia History:  '11 Echo:EF=55-60%, Mild MR, Mild-Moderate TR; 12/11 PTVP(generator change out) Cardiac Risk Factors: Hypertension and Lipids  Symptoms:  Chest and (L) Shoulder Pain (last episode of chest discomfort was about 3-weeks ago) and Fatigue; H/O Atrial Fibrillation   Nuclear Pre-Procedure Caffeine/Decaff Intake:  None NPO After: 8:00am   Lungs:  Clear.  O2 SAT 98% on RA IV 0.9% NS with Angio Cath:  22g  IV Site: R Wrist  IV Started by:  Cathlyn Parsons, RN  Chest Size (in):  38 Cup Size: B  Height: 5\' 2"  (1.575 m)  Weight:  142 lb (64.411 kg)  BMI:  Body mass index is 25.97 kg/(m^2). Tech Comments:  n/a    Nuclear Med Study 1 or 2 day study: 1 day  Stress Test Type:  Adenosine  Reading MD: Marca Ancona, MD  Order Authorizing Provider:  Hillis Range, MD; Tereso Newcomer, PA-C  Resting Radionuclide: Technetium 66m Tetrofosmin  Resting Radionuclide Dose: 11.0 mCi   Stress Radionuclide:  Technetium 16m Tetrofosmin  Stress Radionuclide Dose: 33.0 mCi           Stress Protocol Rest HR: 60 Stress HR: 88  Rest BP: 140/80 Stress BP: 154/78  Exercise Time (min): n/a METS: n/a   Predicted Max HR: 135 bpm % Max HR: 65.19 bpm Rate Pressure Product: 21308   Dose of Adenosine (mg):  36.1 Dose of Lexiscan: n/a mg  Dose of Atropine (mg): n/a Dose of Dobutamine: n/a mcg/kg/min (at max HR)  Stress Test Technologist: Smiley Houseman, CMA-N  Nuclear Technologist:  Domenic Polite, CNMT     Rest Procedure:  Myocardial perfusion imaging was performed at rest 45 minutes following the intravenous administration of Technetium 85m Tetrofosmin.  Rest ECG: V-Paced, LBBB.  Stress Procedure:  The patient received IV  adenosine at 140 mcg/kg/min for 4 minutes.  There were no significant changes with infusion.  Technetium 33m Tetrofosmin was injected at the 2 minute mark and quantitative spect images were obtained after a 45 minute delay.  Stress ECG: No significant change from baseline ECG  QPS Raw Data Images:  Normal; no motion artifact; normal heart/lung ratio. Stress Images:  Normal homogeneous uptake in all areas of the myocardium. Rest Images:  Normal homogeneous uptake in all areas of the myocardium. Subtraction (SDS):  There is no evidence of scar or ischemia. Transient Ischemic Dilatation (Normal <1.22):  1.05 Lung/Heart Ratio (Normal <0.45):  0.37  Quantitative Gated Spect Images QGS EDV:  47 ml QGS ESV:  8 ml QGS cine images:  NL LV Function; NL Wall Motion QGS EF: 82%  Impression Exercise Capacity:  Adenosine study with no exercise. BP Response:  Normal blood pressure response. Clinical Symptoms:  Diaphoretic.  ECG Impression:  V-paced Comparison with Prior Nuclear Study: No previous nuclear study performed  Overall Impression:  Normal stress nuclear study. Earlee Herald [employer]

## 2011-03-02 ENCOUNTER — Ambulatory Visit (INDEPENDENT_AMBULATORY_CARE_PROVIDER_SITE_OTHER): Payer: Medicare Other | Admitting: Internal Medicine

## 2011-03-02 ENCOUNTER — Encounter: Payer: Self-pay | Admitting: Internal Medicine

## 2011-03-02 VITALS — BP 98/64 | HR 83 | Ht 61.0 in | Wt 143.6 lb

## 2011-03-02 DIAGNOSIS — J42 Unspecified chronic bronchitis: Secondary | ICD-10-CM

## 2011-03-02 MED ORDER — HYDROCODONE-HOMATROPINE 5-1.5 MG/5ML PO SYRP: 2.5000 mL | ORAL_SOLUTION | Freq: Three times a day (TID) | ORAL | Status: AC | PRN

## 2011-03-02 NOTE — Progress Notes (Signed)
Patient ID: [name], female    DOB: January 22, 1923, 77 y.o.   MRN: [id number]  HPI 12/15/10-this 77 year old woman comes with her husband and daughter in pulmonary consultation at the request of Dr. Redmond School, concerned about cough and fatigue. She is a never smoker being managed by a neurologist the family cannot name, for a neurologic condition they aren't sure of the name of. Together they describe cough new since June of 2012, associated with postnasal drip. She was in assisted living while her daughter was on vacation, and got pneumonia there. Pneumonia was diagnosed by chest x-ray and treated with Levaquin which ended recently. She is bothered especially at night by a sense of postnasal drainage and productive cough. She has tried sleeping on a wedge. No prior history of lung disease except occasional bronchitis. She did have a bronchitis in April of 2011 requiring hospital stay. Some choking with food or drink, felt as a tickle in her throat. Past history of reflux and esophageal dilatation but she does not recognize active reflux. Tussive left rib pain. Mild shortness of breath is not exertional. Cough wakes her at night. No exposure to tuberculosis. Never smoked. Has had Pneumovax at least once, and annual flu vaccine. A note from a home medical visit is available from [facility] Chest x-ray dated 12/04/2010 reports clear chest normal heart size with pacemaker and no active process.  01/17/11-77 year old female never smoker Returns to followup pulmonary consultation at the request of Dr. Redmond School, concerned about cough and fatigue. Complicated by neuropathic weakness, pacemaker/CHB/paroxysmal atrial fib.  Family here Her neurologist added Kepra. They still aren't clear of the dx, etiology or course. She is getting better, clearer and more active in the home.  She feels she is better. Much less cough. She still coughs at night, but it helps to give 1/4 tsp Tussionex at bedtime. They  ask why she still has the cough. Notes new hiccups with eating.  CXR single view July 23 done mobile at home saw pacemaker but no acute process.  They have found that daily loratadine reduces her postnasal drip.   03/02/11-77 year old female never smoker, concerned about cough and fatigue. Complicated by neuropathic weakness, pacemaker/CHB/paroxysmal atrial fib.  Family here She still takes a little Tussionex every night at bedtime. Without it she doesn't sleep as well and she is more apt to wake coughing. We discussed this and available alternatives. She attributes insomnia partly to "busy brain" as well as cough. She denies daytime discomfort, without cough or shortness of breath usually. She is not recognizing reflux or significant postnasal drainage. Chest x-ray September 5 questioned a density at the lingula. CT chest 01/19/2011 showed clear lungs, pacemaker, no significant lymph nodes or active abnormality. These were reviewed with her.  Review of Systems Constitutional:   No-   weight loss, night sweats, fevers, chills,  HEENT:   No-   headaches, difficulty swallowing, tooth/dental problems, sore throat,                  No-   sneezing, itching, ear ache, nasal congestion, post nasal drip,  CV:    No-chest pain, No-orthopnea, PND, swelling in lower extremities, anasarca, dizziness, palpitations GI:  No-   heartburn, indigestion, abdominal pain, nausea, vomiting, diarrhea,                 change in bowel habits, loss of appetite Resp: No-   shortness of breath with exertion or at rest.  No-  excess mucus,            No- coughing up of blood.              No-   change in color of mucus.  No- wheezing.   Skin: No-   rash or lesions. GU: No-   dysuria, change in color of urine, no urgency or frequency.  No- flank pain. MS:  No-   joint pain or swelling.  No- decreased range of motion.  No- back pain. Psych:  No- change in mood or affect. No depression or anxiety.  No memory loss.        Objective:   Physical Exam General- Alert, Oriented, Affect-appropriate, Distress- none acute  More alert and interactive,  seems oriented Skin- rash-none, lesions- none, excoriation- none Lymphadenopathy- none Head- atraumatic            Eyes- Gross vision intact, PERRLA, conjunctivae clear secretions            Ears- Hearing, diminished            Nose- Clear, No-Septal dev, mucus, polyps, erosion, perforation             Throat- Mallampati II , mucosa clear , drainage- none, tonsils- atrophic Neck- flexible , trachea midline, no stridor , thyroid nl, carotid no bruit Chest - symmetrical excursion , unlabored           Heart/CV- RRR , no murmur , no gallop  , no rub, nl s1 s2                           - JVD- none , edema- none, stasis changes- none, varices- none           Lung- clear to P&A ,wheeze- none, cough- none while here , dullness-none, rub- none           Chest wall- pacemaker left chest Abd- tender-no, distended-no, bowel sounds-present, HSM- no Br/ Gen/ Rectal- Not done, not indicated Extrem- cyanosis- none, clubbing, none, atrophy- none, strength- reduced. Using a walker. Neuro- grossly intact to observation

## 2011-03-02 NOTE — Patient Instructions (Signed)
Most of the cough seems to be from reflux of stomach juice part way up your esophagus. Try to sleep with head up a little and don't lie down for 2 hours after a meal.   Script for cough syrup- use sparingly

## 2011-03-04 ENCOUNTER — Encounter: Payer: Self-pay | Admitting: Internal Medicine

## 2011-03-04 NOTE — Assessment & Plan Note (Signed)
She is much improved. Positional cough associated with lying down always raises possibilities of postnasal drainage or reflux. I have recommended elevation head of bed. We will let her continue using Tussionex conservatively.

## 2011-03-19 ENCOUNTER — Encounter: Payer: Self-pay | Admitting: Internal Medicine

## 2011-03-19 ENCOUNTER — Ambulatory Visit (INDEPENDENT_AMBULATORY_CARE_PROVIDER_SITE_OTHER): Payer: Medicare Other | Admitting: Internal Medicine

## 2011-03-19 DIAGNOSIS — R0789 Other chest pain: Secondary | ICD-10-CM

## 2011-03-19 DIAGNOSIS — I4891 Unspecified atrial fibrillation: Secondary | ICD-10-CM

## 2011-03-19 DIAGNOSIS — I442 Atrioventricular block, complete: Secondary | ICD-10-CM

## 2011-03-19 LAB — PACEMAKER DEVICE OBSERVATION
AL AMPLITUDE: 0.7 mv
AL IMPEDENCE PM: 612 Ohm
AL THRESHOLD: 1 V
ATRIAL PACING PM: 2.7
BATTERY VOLTAGE: 2.79 V
DEVICE MODEL PM: 243410
RV LEAD IMPEDENCE PM: 908 Ohm
RV LEAD THRESHOLD: 1.25 V
VENTRICULAR PACING PM: 100

## 2011-03-19 NOTE — Assessment & Plan Note (Signed)
Resolved Normal myoview

## 2011-03-19 NOTE — Assessment & Plan Note (Signed)
Normal pacemaker function See Pace Art report No changes today  

## 2011-03-19 NOTE — Assessment & Plan Note (Signed)
Stable I had a long discussion with patient and her spouse today about her risks for stroke.  Her CHADS2 score is at least 2 (age and HTN).  She should therefore be chronically anticoagulated with coumadin/ pradaxa/ or xarelto.  She has had labile INRs in the past and has been felt to not be a coumadin candidate.  Her CrCl today is 48.  She could therefore take pradaxa 150mg  BID or Xarelto 15mg  daily.  Unfortunately, she states that she is presently in the "donut hole" and cannot afford these medications.  She will reconsider in the future.  Presently, she would like to continue ASA unchanged.

## 2011-03-19 NOTE — Patient Instructions (Addendum)
Your physician wants you to follow-up in: 6 months with Sunday Spillers and 12 months with Dr Jacquiline Doe will receive a reminder letter in the mail two months in advance. If you don't receive a letter, please call our office to schedule the follow-up appointment.  Remote monitoring is used to monitor your Pacemaker of ICD from home. This monitoring reduces the number of office visits required to check your device to one time per year. It allows [place] to keep an eye on the functioning of your device to ensure it is working properly. You are scheduled for a device check from home on 06/21/2011. You may send your transmission at any time that day. If you have a wireless device, the transmission will be sent automatically. After your physician reviews your transmission, you will receive a postcard with your next transmission date.

## 2011-03-19 NOTE — Progress Notes (Signed)
The patient presents today for routine electrophysiology followup.  She was recently seen by Tereso Newcomer with atypical chest and shoulder pain.  She had a normal myoview obtained.  She has doing well since that time.  Her symptoms have resolved.  She has mild stable edema.  Today, she denies symptoms of palpitations, chest pain, shortness of breath, orthopnea, PND,dizziness, presyncope, syncope, or neurologic sequela.  The patient feels that she is tolerating medications without difficulties and is otherwise without complaint today.   Past Medical History  Diagnosis Date  . Arthritis   . Depression   . Hyperlipidemia   . Hypothyroidism   . HB (heart block)     s/p PPM, most recent generator change 12/11 by JA  . Dementia   . Hiatal hernia   . PTE (post-transplant erythrocytosis)     prior DVT 4/11 perviously on coudmadin, recently stopped by Dr. Deborah Chalk due to labile INRS   . Atrial fibrillation    Past Surgical History  Procedure Date  . Hemorrhoid surgery   . Pacemaker     most recent generator 12/11 by JA  . Hysterectomy (other)   . Cholecystectomy 09/2010    Current Outpatient Prescriptions  Medication Sig Dispense Refill  . alendronate (FOSAMAX) 70 MG tablet Take 70 mg by mouth every 7 (seven) days. Take with a full glass of water on an empty stomach.      [name] aspirin 81 MG tablet Take 81 mg by mouth daily.        . Calcium Carbonate (CALCIUM-CARB 600 PO) Take by mouth daily.        . cholecalciferol (VITAMIN D) 1000 UNITS tablet Take 1,000 Units by mouth as directed.        . citalopram (CELEXA) 10 MG tablet Take 10 mg by mouth daily.        [name] donepezil (ARICEPT) 10 MG tablet Take 10 mg by mouth daily.       [name] levETIRAcetam (KEPPRA) 250 MG tablet Sun, tues, thurs, sat      . levothyroxine (SYNTHROID, LEVOTHROID) 50 MCG tablet Take 50 mcg by mouth daily.        . memantine (NAMENDA) 10 MG tablet Take 10 mg by mouth 2 (two) times daily.        . Multiple Vitamin (MULTIVITAMIN)  capsule Take 1 capsule by mouth daily.        [name] NITROSTAT 0.4 MG SL tablet Place 0.4 mg under the tongue every 5 (five) minutes as needed.       [name] omeprazole (PRILOSEC) 20 MG capsule Take 20 mg by mouth 2 (two) times daily.       . pravastatin (PRAVACHOL) 20 MG tablet Take 20 mg by mouth daily.        . salsalate (DISALCID) 750 MG tablet Take 750 mg by mouth 2 (two) times daily.        Bernadette Hoit Sodium (SENNA-S PO) Take 1 tablet by mouth 2 (two) times daily.       [name] zolpidem (AMBIEN) 10 MG tablet Take 10 mg by mouth at bedtime as needed.          No Known Allergies  History   Social History  . Marital Status: Married    Spouse Name: N/A    Number of Children: 2  . Years of Education: N/A   Occupational History  . retired Catering manager    Social History Main Topics  . Smoking status: Never Smoker   . Smokeless  tobacco: Not on file  . Alcohol Use: No  . Drug Use: No  . Sexually Active: Not on file   Other Topics Concern  . Not on file   Social History Narrative   Lives at home with spouse in [place]. Denies ETOH.     Family History  Problem Relation Age of Onset  . Stomach cancer Brother   . Stomach cancer Brother   . Liver cancer Brother   . Stroke Father   . Breast cancer Sister   . Heart disease Mother   . Heart disease Father   . Arthritis Mother   . Arthritis Father     Physical Exam: Filed Vitals:   03/19/11 1200  BP: 112/68  Pulse: 75  Height: 5\' 1"  (1.549 m)  Weight: 145 lb (65.772 kg)    GEN- The patient is elderly appearing, alert and oriented x 3 today, mild cognative impairment Head- normocephalic, atraumatic Eyes-  Sclera clear, conjunctiva pink Ears- hearing intact Oropharynx- clear Neck- supple, no JVP Lymph- no cervical lymphadenopathy Lungs- Clear to ausculation bilaterally, normal work of breathing Chest- pacemaker pocket is well healed Heart- Regular rate and rhythm, no murmurs, rubs or gallops, PMI not laterally  displaced GI- soft, NT, ND, + BS Extremities- no clubbing, cyanosis, trace edema   Pacemaker interrogation- reviewed in detail today,  See PACEART report  Assessment and Plan:

## 2011-03-27 ENCOUNTER — Telehealth: Payer: Self-pay

## 2011-03-27 MED ORDER — DABIGATRAN ETEXILATE MESYLATE 150 MG PO CAPS: 150.0000 mg | ORAL_CAPSULE | Freq: Two times a day (BID) | ORAL | Status: DC

## 2011-03-27 NOTE — Telephone Encounter (Signed)
Pt saw Dr Johney Frame on 03/19/11 and discussed anticoagulation at that time.  Pt wanted to think about because at that time she was in the donut hole, but after consideration they wish to start on anticoagulation either Pradaxa or Xeralto whichever one Dr Johney Frame recommends. [facility].  Spoke with Weston Brass, PharmD per Dr Jenel Lucks note ok to start Pradaxa 150mg  BID, samples given  And rx sent to right [facility] at pt's husbands request.  Instructed pt's husband to have pt stop ASA once starts on Pradaxa. He voices understanding will confirm with Dr Johney Frame pt should discontinue ASA while on Pradaxa.

## 2011-04-11 ENCOUNTER — Encounter: Payer: Self-pay | Admitting: Internal Medicine

## 2011-05-01 ENCOUNTER — Telehealth: Payer: Self-pay | Admitting: Internal Medicine

## 2011-05-01 DIAGNOSIS — I4891 Unspecified atrial fibrillation: Secondary | ICD-10-CM

## 2011-05-01 DIAGNOSIS — R42 Dizziness and giddiness: Secondary | ICD-10-CM

## 2011-05-01 NOTE — Telephone Encounter (Signed)
New msg Pt's hisband called Pt is having dizziness and husband thinks it may be related to one of her medicines Please call

## 2011-05-01 NOTE — Telephone Encounter (Signed)
Dr Johney Frame had started her on Pradaxa 150mg  twice daily for her afib  They want to go back on Coumadin due to her being dizzy with the Pradaxa.(they think)  This all started within a week of starting the medication. No blood in stool or dark stools She has had some swelling in her ankles.  She is not walking around without her walker like she was prior to starting the medication

## 2011-05-04 ENCOUNTER — Ambulatory Visit: Payer: Medicare Other | Admitting: *Deleted

## 2011-05-04 DIAGNOSIS — I4891 Unspecified atrial fibrillation: Secondary | ICD-10-CM

## 2011-05-04 DIAGNOSIS — R42 Dizziness and giddiness: Secondary | ICD-10-CM

## 2011-05-04 NOTE — Telephone Encounter (Signed)
Spoke with Daughter and she will bring her in for labs today

## 2011-05-04 NOTE — Telephone Encounter (Signed)
Follow-up:   Patient called wanting a follow-up call about his wife's dizziness that he thinks is related to her Pradaxa.

## 2011-05-05 LAB — CBC WITH DIFFERENTIAL/PLATELET
Basophils Absolute: 0 10*3/uL (ref 0.0–0.1)
Basophils Relative: 1 % (ref 0–1)
Eosinophils Absolute: 0.1 10*3/uL (ref 0.0–0.7)
Eosinophils Relative: 1 % (ref 0–5)
HCT: 41.6 % (ref 36.0–46.0)
Hemoglobin: 14.1 g/dL (ref 12.0–15.0)
Lymphocytes Relative: 50 % — ABNORMAL HIGH (ref 12–46)
Lymphs Abs: 3 10*3/uL (ref 0.7–4.0)
MCH: 32.5 pg (ref 26.0–34.0)
MCHC: 33.9 g/dL (ref 30.0–36.0)
MCV: 95.9 fL (ref 78.0–100.0)
Monocytes Absolute: 0.5 10*3/uL (ref 0.1–1.0)
Monocytes Relative: 8 % (ref 3–12)
Neutro Abs: 2.4 10*3/uL (ref 1.7–7.7)
Neutrophils Relative %: 40 % — ABNORMAL LOW (ref 43–77)
Platelets: 198 10*3/uL (ref 150–400)
RBC: 4.34 MIL/uL (ref 3.87–5.11)
RDW: 14.5 % (ref 11.5–15.5)
WBC: 6 10*3/uL (ref 4.0–10.5)

## 2011-05-05 LAB — BASIC METABOLIC PANEL
BUN: 23 mg/dL (ref 6–23)
CO2: 22 mEq/L (ref 19–32)
Calcium: 9.8 mg/dL (ref 8.4–10.5)
Chloride: 106 mEq/L (ref 96–112)
Creat: 0.89 mg/dL (ref 0.50–1.10)
Glucose, Bld: 52 mg/dL — ABNORMAL LOW (ref 70–99)
Potassium: 5.1 mEq/L (ref 3.5–5.3)
Sodium: 143 mEq/L (ref 135–145)

## 2011-05-09 NOTE — Telephone Encounter (Signed)
Fu call Pt's husband calling back about lab results

## 2011-05-09 NOTE — Telephone Encounter (Signed)
Spoke with the husband regarding her labs  Her blood sugar was 51 and she is still getting dizzy with the Pradaxa  When she takes the Pradaxa once daily because her husband has decided to do so.  Her PCP is Dr Redmond School and still having the feeling of head swimming when she takes the doses of Pradaxa  He states she has always had trouble taking medications  Will discuss with Dr Johney Frame tomorrow and let him know Dr Amedeo Plenty thoughts

## 2011-05-11 NOTE — Telephone Encounter (Addendum)
PT'S DTR CALLING RE MESSAGE FROM 12-26, DIDN'T RECEIVE CALL YESTERDAY AND WANTS A CALL BACK FROM [facility] ON Monday @ [phone]

## 2011-05-15 NOTE — Telephone Encounter (Signed)
If unable to take pradaxa, would recommend Xarelto.  Coumadin also an option.

## 2011-05-16 DIAGNOSIS — J811 Chronic pulmonary edema: Secondary | ICD-10-CM | POA: Diagnosis not present

## 2011-05-16 NOTE — Telephone Encounter (Signed)
Pt daughter Steward Drone called regarding medication making pt dizzy they where to hear from Dr. Johney Frame Monday and they still have not heard anything

## 2011-05-16 NOTE — Telephone Encounter (Signed)
Spoke with husband again  He says she is still having problems with the Pradaxa and is only taking it once a day  I explained to him that this is not how it was prescribed.  He wants to try Coumadin for 3 months and see if her symptoms subside  He is followed in our [facility] clinic and I will send to Weston Brass, Pharm D to set her up.  The reason the Coumadin was changed is because she was having a lot of UTI's and the antibiotics were interfering with the Coumadin  She has not had any UTI's recently  I have let him know he will receive a call from our [facility] clinic as to when to start and follow up

## 2011-05-16 NOTE — Telephone Encounter (Signed)
Spoke with pt's husband and daughter.  Will restart Coumadin at 2.5mg  daily.  Asked him to overlap Coumadin and Pradaxa x 3 days.  When I spoke with pt's daughter, was told pt has [facility] RN coming to home.  Daughter did not know what agency.  She is going to try to find out and let us know. Until then, pt has appt set for 1/9 to check INR.

## 2011-05-23 ENCOUNTER — Ambulatory Visit (INDEPENDENT_AMBULATORY_CARE_PROVIDER_SITE_OTHER): Payer: Medicare Other | Admitting: *Deleted

## 2011-05-23 DIAGNOSIS — I4891 Unspecified atrial fibrillation: Secondary | ICD-10-CM

## 2011-05-23 DIAGNOSIS — Z7901 Long term (current) use of anticoagulants: Secondary | ICD-10-CM | POA: Diagnosis not present

## 2011-05-23 LAB — POCT INR: INR: 1

## 2011-05-28 DIAGNOSIS — E119 Type 2 diabetes mellitus without complications: Secondary | ICD-10-CM | POA: Diagnosis not present

## 2011-05-28 DIAGNOSIS — K219 Gastro-esophageal reflux disease without esophagitis: Secondary | ICD-10-CM | POA: Diagnosis not present

## 2011-05-28 DIAGNOSIS — D518 Other vitamin B12 deficiency anemias: Secondary | ICD-10-CM | POA: Diagnosis not present

## 2011-05-28 DIAGNOSIS — I10 Essential (primary) hypertension: Secondary | ICD-10-CM | POA: Diagnosis not present

## 2011-05-28 DIAGNOSIS — Z0000 Encounter for general adult medical examination without abnormal findings: Secondary | ICD-10-CM | POA: Diagnosis not present

## 2011-05-28 DIAGNOSIS — E039 Hypothyroidism, unspecified: Secondary | ICD-10-CM | POA: Diagnosis not present

## 2011-05-28 DIAGNOSIS — E785 Hyperlipidemia, unspecified: Secondary | ICD-10-CM | POA: Diagnosis not present

## 2011-05-28 DIAGNOSIS — I5032 Chronic diastolic (congestive) heart failure: Secondary | ICD-10-CM | POA: Diagnosis not present

## 2011-05-28 DIAGNOSIS — R42 Dizziness and giddiness: Secondary | ICD-10-CM | POA: Diagnosis not present

## 2011-05-28 DIAGNOSIS — Z86711 Personal history of pulmonary embolism: Secondary | ICD-10-CM | POA: Diagnosis not present

## 2011-05-28 DIAGNOSIS — E559 Vitamin D deficiency, unspecified: Secondary | ICD-10-CM | POA: Diagnosis not present

## 2011-05-29 DIAGNOSIS — I10 Essential (primary) hypertension: Secondary | ICD-10-CM | POA: Diagnosis not present

## 2011-05-30 ENCOUNTER — Ambulatory Visit (INDEPENDENT_AMBULATORY_CARE_PROVIDER_SITE_OTHER): Payer: Medicare Other | Admitting: *Deleted

## 2011-05-30 DIAGNOSIS — I4891 Unspecified atrial fibrillation: Secondary | ICD-10-CM

## 2011-05-30 DIAGNOSIS — Z7901 Long term (current) use of anticoagulants: Secondary | ICD-10-CM

## 2011-05-30 LAB — POCT INR: INR: 2.3

## 2011-06-06 ENCOUNTER — Ambulatory Visit (INDEPENDENT_AMBULATORY_CARE_PROVIDER_SITE_OTHER): Payer: Medicare Other | Admitting: *Deleted

## 2011-06-06 DIAGNOSIS — Z7901 Long term (current) use of anticoagulants: Secondary | ICD-10-CM | POA: Diagnosis not present

## 2011-06-06 DIAGNOSIS — I4891 Unspecified atrial fibrillation: Secondary | ICD-10-CM

## 2011-06-06 LAB — POCT INR: INR: 2.7

## 2011-06-06 MED ORDER — WARFARIN SODIUM 5 MG PO TABS: 5.0000 mg | ORAL_TABLET | Freq: Every day | ORAL | Status: DC

## 2011-06-15 ENCOUNTER — Ambulatory Visit (INDEPENDENT_AMBULATORY_CARE_PROVIDER_SITE_OTHER): Payer: Medicare Other | Admitting: *Deleted

## 2011-06-15 DIAGNOSIS — I4891 Unspecified atrial fibrillation: Secondary | ICD-10-CM

## 2011-06-15 DIAGNOSIS — Z7901 Long term (current) use of anticoagulants: Secondary | ICD-10-CM

## 2011-06-15 LAB — POCT INR: INR: 2.4

## 2011-06-21 ENCOUNTER — Encounter: Payer: Medicare Other | Admitting: *Deleted

## 2011-06-25 ENCOUNTER — Encounter: Payer: Self-pay | Admitting: *Deleted

## 2011-06-29 ENCOUNTER — Ambulatory Visit (INDEPENDENT_AMBULATORY_CARE_PROVIDER_SITE_OTHER): Payer: Medicare Other | Admitting: *Deleted

## 2011-06-29 ENCOUNTER — Encounter: Payer: Self-pay | Admitting: Internal Medicine

## 2011-06-29 DIAGNOSIS — I442 Atrioventricular block, complete: Secondary | ICD-10-CM | POA: Diagnosis not present

## 2011-06-29 DIAGNOSIS — R0789 Other chest pain: Secondary | ICD-10-CM | POA: Diagnosis not present

## 2011-06-29 DIAGNOSIS — I4891 Unspecified atrial fibrillation: Secondary | ICD-10-CM | POA: Diagnosis not present

## 2011-06-29 DIAGNOSIS — Z7901 Long term (current) use of anticoagulants: Secondary | ICD-10-CM | POA: Diagnosis not present

## 2011-06-29 LAB — REMOTE PACEMAKER DEVICE
AL AMPLITUDE: 2.8 mv
AL IMPEDENCE PM: 613 Ohm
AL THRESHOLD: 1.5 V
ATRIAL PACING PM: 4
BAMS-0001: 150 {beats}/min
BATTERY VOLTAGE: 2.8 V
RV LEAD IMPEDENCE PM: 911 Ohm
RV LEAD THRESHOLD: 1.25 V
VENTRICULAR PACING PM: 100

## 2011-06-29 LAB — POCT INR: INR: 2.8

## 2011-07-02 ENCOUNTER — Encounter: Payer: Self-pay | Admitting: *Deleted

## 2011-07-02 DIAGNOSIS — I5032 Chronic diastolic (congestive) heart failure: Secondary | ICD-10-CM | POA: Diagnosis not present

## 2011-07-02 DIAGNOSIS — K219 Gastro-esophageal reflux disease without esophagitis: Secondary | ICD-10-CM | POA: Diagnosis not present

## 2011-07-02 DIAGNOSIS — I4891 Unspecified atrial fibrillation: Secondary | ICD-10-CM | POA: Diagnosis not present

## 2011-07-02 DIAGNOSIS — N3941 Urge incontinence: Secondary | ICD-10-CM | POA: Diagnosis not present

## 2011-07-02 DIAGNOSIS — E039 Hypothyroidism, unspecified: Secondary | ICD-10-CM | POA: Diagnosis not present

## 2011-07-02 DIAGNOSIS — I4430 Unspecified atrioventricular block: Secondary | ICD-10-CM | POA: Diagnosis not present

## 2011-07-02 DIAGNOSIS — Z86711 Personal history of pulmonary embolism: Secondary | ICD-10-CM | POA: Diagnosis not present

## 2011-07-02 DIAGNOSIS — E785 Hyperlipidemia, unspecified: Secondary | ICD-10-CM | POA: Diagnosis not present

## 2011-07-06 NOTE — Progress Notes (Signed)
Remote pacer

## 2011-07-20 ENCOUNTER — Ambulatory Visit (INDEPENDENT_AMBULATORY_CARE_PROVIDER_SITE_OTHER): Payer: Medicare Other

## 2011-07-20 DIAGNOSIS — I4891 Unspecified atrial fibrillation: Secondary | ICD-10-CM

## 2011-07-20 DIAGNOSIS — Z7901 Long term (current) use of anticoagulants: Secondary | ICD-10-CM | POA: Diagnosis not present

## 2011-07-20 LAB — POCT INR: INR: 2.9

## 2011-08-17 ENCOUNTER — Ambulatory Visit (INDEPENDENT_AMBULATORY_CARE_PROVIDER_SITE_OTHER): Payer: Medicare Other | Admitting: Pharmacist

## 2011-08-17 DIAGNOSIS — I4891 Unspecified atrial fibrillation: Secondary | ICD-10-CM | POA: Diagnosis not present

## 2011-08-17 DIAGNOSIS — Z7901 Long term (current) use of anticoagulants: Secondary | ICD-10-CM

## 2011-08-17 LAB — POCT INR: INR: 2.6

## 2011-08-17 MED ORDER — WARFARIN SODIUM 5 MG PO TABS: 5.0000 mg | ORAL_TABLET | Freq: Every day | ORAL | Status: DC

## 2011-08-20 DIAGNOSIS — K219 Gastro-esophageal reflux disease without esophagitis: Secondary | ICD-10-CM | POA: Diagnosis not present

## 2011-08-20 DIAGNOSIS — Z950 Presence of cardiac pacemaker: Secondary | ICD-10-CM | POA: Diagnosis not present

## 2011-08-20 DIAGNOSIS — I5032 Chronic diastolic (congestive) heart failure: Secondary | ICD-10-CM | POA: Diagnosis not present

## 2011-08-20 DIAGNOSIS — E039 Hypothyroidism, unspecified: Secondary | ICD-10-CM | POA: Diagnosis not present

## 2011-08-20 DIAGNOSIS — I4891 Unspecified atrial fibrillation: Secondary | ICD-10-CM | POA: Diagnosis not present

## 2011-08-20 DIAGNOSIS — E785 Hyperlipidemia, unspecified: Secondary | ICD-10-CM | POA: Diagnosis not present

## 2011-08-20 DIAGNOSIS — Z86718 Personal history of other venous thrombosis and embolism: Secondary | ICD-10-CM | POA: Diagnosis not present

## 2011-08-20 DIAGNOSIS — Z86711 Personal history of pulmonary embolism: Secondary | ICD-10-CM | POA: Diagnosis not present

## 2011-09-10 ENCOUNTER — Telehealth: Payer: Self-pay | Admitting: Internal Medicine

## 2011-09-10 NOTE — Telephone Encounter (Signed)
New Problem:     Patient's daughter called in because for the past couple of weeks the patient's legs are swollen and pink, with the left one that she had a blood clot in larger than the right, and they were wondering what they should do.  Please call back.

## 2011-09-13 NOTE — Telephone Encounter (Signed)
lmom for daughter to call me b

## 2011-09-13 NOTE — Telephone Encounter (Signed)
lmom for daughter to return my call.  I have called the number listed since her initial phone call.  The voicemail does not identify the person whose phone it is.

## 2011-09-14 ENCOUNTER — Ambulatory Visit (INDEPENDENT_AMBULATORY_CARE_PROVIDER_SITE_OTHER): Payer: Medicare Other | Admitting: *Deleted

## 2011-09-14 DIAGNOSIS — I4891 Unspecified atrial fibrillation: Secondary | ICD-10-CM | POA: Diagnosis not present

## 2011-09-14 DIAGNOSIS — Z7901 Long term (current) use of anticoagulants: Secondary | ICD-10-CM | POA: Diagnosis not present

## 2011-09-14 LAB — POCT INR: INR: 2.9

## 2011-09-14 NOTE — Telephone Encounter (Signed)
Pt was in today for INR check and this was not mentioned.  Will try again to reach her family

## 2011-09-18 DIAGNOSIS — G3189 Other specified degenerative diseases of nervous system: Secondary | ICD-10-CM | POA: Diagnosis not present

## 2011-09-19 NOTE — Telephone Encounter (Signed)
Called and spoke with daughter.  She said this was addressed at her [facility] appointment and she has a follow up with Sunday Spillers on 09/28/11

## 2011-09-27 ENCOUNTER — Encounter: Payer: Medicare Other | Admitting: *Deleted

## 2011-09-28 ENCOUNTER — Encounter: Payer: Self-pay | Admitting: Nurse Practitioner

## 2011-09-28 ENCOUNTER — Ambulatory Visit (INDEPENDENT_AMBULATORY_CARE_PROVIDER_SITE_OTHER): Payer: Medicare Other | Admitting: Nurse Practitioner

## 2011-09-28 VITALS — BP 108/58 | HR 74 | Ht 61.0 in | Wt 149.0 lb

## 2011-09-28 DIAGNOSIS — R609 Edema, unspecified: Secondary | ICD-10-CM

## 2011-09-28 DIAGNOSIS — R600 Localized edema: Secondary | ICD-10-CM | POA: Insufficient documentation

## 2011-09-28 LAB — BASIC METABOLIC PANEL
BUN: 20 mg/dL (ref 6–23)
CO2: 27 mEq/L (ref 19–32)
Calcium: 9.2 mg/dL (ref 8.4–10.5)
Chloride: 109 mEq/L (ref 96–112)
Creatinine, Ser: 0.8 mg/dL (ref 0.4–1.2)
GFR: 75.47 mL/min (ref >60.00)
Glucose, Bld: 118 mg/dL — ABNORMAL HIGH (ref 70–99)
Potassium: 4.3 mEq/L (ref 3.5–5.1)
Sodium: 142 mEq/L (ref 135–145)

## 2011-09-28 MED ORDER — FUROSEMIDE 20 MG PO TABS: 20.0000 mg | ORAL_TABLET | Freq: Every day | ORAL | Status: DC

## 2011-09-28 NOTE — Progress Notes (Signed)
[name] Date of Birth: Jun 22, 1922 Medical Record #[id number]  History of Present Illness: [name] is seen back today for a work in visit. She is seen for Dr. Johney Frame. She is a former patient of Dr. Ronnald Nian. She has a pacemaker in place. Has had prior PE/DVT and is on coumadin. She has dementia. Several years ago, she had some undefined neurologic disorder that resolved.   She comes in today. She is here with her husband. I had seen her in the hallway a couple of weeks ago and she was complaining of some edema, especially on the left. She was encouraged to start using her support stockings again. She is here today for follow up. She is wearing her support stocking. It has helped some but still notes the swelling. It doesn't really go down overnight. She remains quite active. They are still living in their home. She is on coumadin and her INR's have been good. No excessive bleeding or bruising reported.   Current Outpatient Prescriptions on File Prior to Visit  Medication Sig Dispense Refill  . Calcium Carbonate (CALCIUM-CARB 600 PO) Take by mouth daily.        . cholecalciferol (VITAMIN D) 1000 UNITS tablet Take 1,000 Units by mouth as directed.        . donepezil (ARICEPT) 10 MG tablet Take 5 mg by mouth daily.       [name] levETIRAcetam (KEPPRA) 250 MG tablet Take 125 mg by mouth daily.       [name] levothyroxine (SYNTHROID, LEVOTHROID) 50 MCG tablet Take 50 mcg by mouth daily.        . memantine (NAMENDA) 10 MG tablet Take 5 mg by mouth 2 (two) times daily.       . Multiple Vitamin (MULTIVITAMIN) capsule Take 1 capsule by mouth daily.        [name] NITROSTAT 0.4 MG SL tablet Place 0.4 mg under the tongue every 5 (five) minutes as needed.       [name] omeprazole (PRILOSEC) 20 MG capsule Take 20 mg by mouth 2 (two) times daily.       . pravastatin (PRAVACHOL) 20 MG tablet Take 20 mg by mouth daily.        . salsalate (DISALCID) 750 MG tablet Take 750 mg by mouth 2 (two) times daily.        Bernadette Hoit Sodium (SENNA-S PO) Take 1 tablet by mouth 2 (two) times daily.       [name] warfarin (COUMADIN) 5 MG tablet Take 1 tablet (5 mg total) by mouth daily.  40 tablet  3  . furosemide (LASIX) 20 MG tablet Take 1 tablet (20 mg total) by mouth daily.  30 tablet  3    No Known Allergies  Past Medical History  Diagnosis Date  . Arthritis   . Depression   . Hyperlipidemia   . Hypothyroidism   . HB (heart block)     s/p PPM, most recent generator change 12/11 by JA  . Dementia   . Hiatal hernia   . PTE (post-transplant erythrocytosis)   . Atrial fibrillation   . Pulmonary embolism 2011    on coumadin  . DVT (deep venous thrombosis) 2011  . UTI (lower urinary tract infection)     Past Surgical History  Procedure Date  . Hemorrhoid surgery   . Pacemaker     most recent generator 12/11 by JA  . Hysterectomy (other)   . Cholecystectomy 09/2010    History  Smoking status  .  Never Smoker   Smokeless tobacco  . Not on file    History  Alcohol Use No    Family History  Problem Relation Age of Onset  . Stomach cancer Brother   . Stomach cancer Brother   . Liver cancer Brother   . Stroke Father   . Heart disease Father   . Arthritis Father   . Breast cancer Sister   . Heart disease Mother   . Arthritis Mother     Review of Systems: The review of systems is per the HPI. She is not having chest pain. No shortness of breath. Does have issues with her balance. No falls.  All other systems were reviewed and are negative.  Physical Exam: BP 108/58  Pulse 74  Ht 5\' 1"  (1.549 m)  Wt 149 lb (67.586 kg)  BMI 28.15 kg/m2 Patient is very pleasant and in no acute distress. She is well dressed and looks younger than her stated age. Skin is warm and dry. Color is normal.  HEENT is unremarkable. Normocephalic/atraumatic. PERRL. Sclera are nonicteric. Neck is supple. No masses. No JVD. Lungs are clear. Cardiac exam shows a regular rate and rhythm. Abdomen is soft.  Extremities are with trace edema on the right and 1+ on the left. Gait and ROM are intact. She has a cane but is carrying it more than actually using it. No gross neurologic deficits noted.   LABORATORY DATA:  BMET today is pending.  Lab Results  Component Value Date   INR 2.9 09/14/2011   INR 2.6 08/17/2011   INR 2.9 07/20/2011     Assessment / Plan:

## 2011-09-28 NOTE — Assessment & Plan Note (Signed)
She is to continue with the support stockings, elevate her legs and minimize salt. Will check a BMET today. Will let her use 20 mg of Lasix daily for one week only and then prn. I suspect this is due to her prior DVT and will probably be a chronic issue. She is otherwise doing well. I will see her back in about one month. Her INR's have been good. Patient and her husband are agreeable to this plan and will call if any problems develop in the interim.

## 2011-09-28 NOTE — Patient Instructions (Signed)
We are going to check some lab today  I want you to wear your support stockings, minimize your salt and elevate your legs  I am going to put you on a very low dose of Lasix 20 mg to take every day for a week and then only as needed.  I will see you in a month.  Call the [facility] office at [phone] if you have any questions, problems or concerns.

## 2011-10-01 ENCOUNTER — Telehealth: Payer: Self-pay | Admitting: *Deleted

## 2011-10-01 NOTE — Telephone Encounter (Signed)
Message copied by Royanne Foots on Mon Oct 01, 2011 11:08 AM ------      Message from: Rosalio Macadamia      Created: Sat Sep 29, 2011  6:01 PM       Ok to report. Looks ok.

## 2011-10-01 NOTE — Telephone Encounter (Signed)
ADVISED PATIENT OF RESULTS.  °

## 2011-10-05 ENCOUNTER — Ambulatory Visit (INDEPENDENT_AMBULATORY_CARE_PROVIDER_SITE_OTHER): Payer: Medicare Other | Admitting: *Deleted

## 2011-10-05 ENCOUNTER — Encounter: Payer: Self-pay | Admitting: *Deleted

## 2011-10-05 DIAGNOSIS — Z7901 Long term (current) use of anticoagulants: Secondary | ICD-10-CM | POA: Diagnosis not present

## 2011-10-05 DIAGNOSIS — I4891 Unspecified atrial fibrillation: Secondary | ICD-10-CM | POA: Diagnosis not present

## 2011-10-05 LAB — POCT INR: INR: 1.5

## 2011-10-10 DIAGNOSIS — N3946 Mixed incontinence: Secondary | ICD-10-CM | POA: Diagnosis not present

## 2011-10-10 DIAGNOSIS — R3915 Urgency of urination: Secondary | ICD-10-CM | POA: Diagnosis not present

## 2011-10-11 ENCOUNTER — Encounter: Payer: Self-pay | Admitting: Internal Medicine

## 2011-10-11 ENCOUNTER — Ambulatory Visit (INDEPENDENT_AMBULATORY_CARE_PROVIDER_SITE_OTHER): Payer: Medicare Other | Admitting: *Deleted

## 2011-10-11 DIAGNOSIS — I4891 Unspecified atrial fibrillation: Secondary | ICD-10-CM | POA: Diagnosis not present

## 2011-10-11 DIAGNOSIS — I442 Atrioventricular block, complete: Secondary | ICD-10-CM

## 2011-10-12 LAB — REMOTE PACEMAKER DEVICE
AL AMPLITUDE: 0.7 mv
AL IMPEDENCE PM: 622 Ohm
AL THRESHOLD: 1.625 V
ATRIAL PACING PM: 6
BAMS-0001: 150 {beats}/min
BATTERY VOLTAGE: 2.8 V
RV LEAD IMPEDENCE PM: 861 Ohm
RV LEAD THRESHOLD: 1.375 V
VENTRICULAR PACING PM: 100

## 2011-10-15 ENCOUNTER — Ambulatory Visit (INDEPENDENT_AMBULATORY_CARE_PROVIDER_SITE_OTHER): Payer: Medicare Other | Admitting: Pharmacist

## 2011-10-15 ENCOUNTER — Other Ambulatory Visit: Payer: Self-pay | Admitting: Dermatology

## 2011-10-15 DIAGNOSIS — C44319 Basal cell carcinoma of skin of other parts of face: Secondary | ICD-10-CM | POA: Diagnosis not present

## 2011-10-15 DIAGNOSIS — I4891 Unspecified atrial fibrillation: Secondary | ICD-10-CM | POA: Diagnosis not present

## 2011-10-15 DIAGNOSIS — Z7901 Long term (current) use of anticoagulants: Secondary | ICD-10-CM | POA: Diagnosis not present

## 2011-10-15 DIAGNOSIS — L259 Unspecified contact dermatitis, unspecified cause: Secondary | ICD-10-CM | POA: Diagnosis not present

## 2011-10-15 LAB — POCT INR: INR: 3.2

## 2011-10-19 ENCOUNTER — Encounter: Payer: Self-pay | Admitting: Cardiology

## 2011-10-19 DIAGNOSIS — R3915 Urgency of urination: Secondary | ICD-10-CM | POA: Diagnosis not present

## 2011-10-29 ENCOUNTER — Ambulatory Visit (INDEPENDENT_AMBULATORY_CARE_PROVIDER_SITE_OTHER): Payer: Medicare Other | Admitting: Cardiology

## 2011-10-29 ENCOUNTER — Encounter: Payer: Self-pay | Admitting: Nurse Practitioner

## 2011-10-29 ENCOUNTER — Encounter: Payer: Self-pay | Admitting: Internal Medicine

## 2011-10-29 ENCOUNTER — Ambulatory Visit (INDEPENDENT_AMBULATORY_CARE_PROVIDER_SITE_OTHER): Payer: Medicare Other | Admitting: Nurse Practitioner

## 2011-10-29 ENCOUNTER — Ambulatory Visit (INDEPENDENT_AMBULATORY_CARE_PROVIDER_SITE_OTHER): Payer: Medicare Other

## 2011-10-29 VITALS — BP 120/62 | HR 72 | Ht 61.0 in | Wt 144.8 lb

## 2011-10-29 DIAGNOSIS — I4891 Unspecified atrial fibrillation: Secondary | ICD-10-CM | POA: Diagnosis not present

## 2011-10-29 DIAGNOSIS — Z7901 Long term (current) use of anticoagulants: Secondary | ICD-10-CM

## 2011-10-29 DIAGNOSIS — I442 Atrioventricular block, complete: Secondary | ICD-10-CM | POA: Diagnosis not present

## 2011-10-29 DIAGNOSIS — R609 Edema, unspecified: Secondary | ICD-10-CM | POA: Diagnosis not present

## 2011-10-29 LAB — PACEMAKER DEVICE OBSERVATION
AL AMPLITUDE: 2.8 mv
AL IMPEDENCE PM: 633 Ohm
AL THRESHOLD: 1.625 V
ATRIAL PACING PM: 6.4
BAMS-0001: 150 {beats}/min
BATTERY VOLTAGE: 2.79 V
RV LEAD IMPEDENCE PM: 894 Ohm
RV LEAD THRESHOLD: 1.5 V
VENTRICULAR PACING PM: 100

## 2011-10-29 LAB — POCT INR: INR: 3.6

## 2011-10-29 NOTE — Assessment & Plan Note (Signed)
Checking INR today

## 2011-10-29 NOTE — Assessment & Plan Note (Signed)
Formal pacemaker check today as well. She remains on her coumadin.

## 2011-10-29 NOTE — Progress Notes (Signed)
[name] Date of Birth: [birth year] Medical Record #[id number]  History of Present Illness: [name] is seen back today for a one month check. She is seen for Dr. Johney Frame. She has had prior PE/DVT and is on chronic coumadin. Has pacemaker in place. Several years ago she had some neurologic disorder that resolved and was never fully understood.   She comes in today. She is here with her husband. She has had issues with some swelling. She is on some low dose Lasix just prn. She is using support stockings. She says she is "just getting old". No real problem with the swelling anymore. Only using her stockings. Not using salt. Having more issues with urinary incontinence and has not tolerated the medicines given to her. Has had a recent phone check of her pacemaker and needs a more formal evaluation today. No chest pain.   Current Outpatient Prescriptions on File Prior to Visit  Medication Sig Dispense Refill  . alendronate (FOSAMAX) 35 MG tablet Take 35 mg by mouth every 7 (seven) days. Take with a full glass of water on an empty stomach.      . Calcium Carbonate (CALCIUM-CARB 600 PO) Take by mouth daily.        . cholecalciferol (VITAMIN D) 1000 UNITS tablet Take 1,000 Units by mouth as directed.        . citalopram (CELEXA) 20 MG tablet Take 10 mg by mouth daily.      [name] donepezil (ARICEPT) 10 MG tablet Take 5 mg by mouth daily.       [name] levETIRAcetam (KEPPRA) 250 MG tablet Take 125 mg by mouth as needed.       [name] levothyroxine (SYNTHROID, LEVOTHROID) 50 MCG tablet Take 50 mcg by mouth daily.        [name] loratadine (CLARITIN) 10 MG tablet Take 10 mg by mouth as needed.      . memantine (NAMENDA) 10 MG tablet Take 5 mg by mouth 2 (two) times daily.       . Multiple Vitamin (MULTIVITAMIN) capsule Take 1 capsule by mouth daily.        [name] NITROSTAT 0.4 MG SL tablet Place 0.4 mg under the tongue every 5 (five) minutes as needed.       [name] omeprazole (PRILOSEC) 20 MG capsule Take 20 mg by mouth 2 (two)  times daily.       . pravastatin (PRAVACHOL) 20 MG tablet Take 20 mg by mouth daily.        . salsalate (DISALCID) 750 MG tablet Take 750 mg by mouth 2 (two) times daily.        Bernadette Hoit Sodium (SENNA-S PO) Take 1 tablet by mouth 2 (two) times daily.       [name] warfarin (COUMADIN) 5 MG tablet Take 1 tablet (5 mg total) by mouth daily.  40 tablet  3    No Known Allergies  Past Medical History  Diagnosis Date  . Arthritis   . Depression   . Hyperlipidemia   . Hypothyroidism   . HB (heart block)     s/p PPM, most recent generator change 12/11 by JA  . Dementia   . Hiatal hernia   . PTE (post-transplant erythrocytosis)   . Atrial fibrillation   . Pulmonary embolism 2011    on coumadin  . DVT (deep venous thrombosis) 2011  . UTI (lower urinary tract infection)     Past Surgical History  Procedure Date  . Hemorrhoid surgery   .  Pacemaker     most recent generator 12/11 by JA  . Hysterectomy (other)   . Cholecystectomy 09/2010    History  Smoking status  . Never Smoker   Smokeless tobacco  . Not on file    History  Alcohol Use No    Family History  Problem Relation Age of Onset  . Stomach cancer Brother   . Stomach cancer Brother   . Liver cancer Brother   . Stroke Father   . Heart disease Father   . Arthritis Father   . Breast cancer Sister   . Heart disease Mother   . Arthritis Mother     Review of Systems: The review of systems is per the HPI.  All other systems were reviewed and are negative.  Physical Exam: BP 120/62  Pulse 72  Ht 5\' 1"  (1.549 m)  Wt 144 lb 12.8 oz (65.681 kg)  BMI 27.36 kg/m2 Patient is very pleasant and in no acute distress. Skin is warm and dry. Color is normal.  HEENT is unremarkable. Normocephalic/atraumatic. PERRL. Sclera are nonicteric. Neck is supple. No masses. No JVD. Lungs are clear. Cardiac exam shows a regular rate and rhythm. Abdomen is soft. Extremities are with just trace edema. Gait and ROM are intact. She  is using her walker. No gross neurologic deficits noted.   LABORATORY DATA: Lab Results  Component Value Date   WBC 6.0 05/04/2011   HGB 14.1 05/04/2011   HCT 41.6 05/04/2011   PLT 198 05/04/2011   GLUCOSE 118* 09/28/2011   CHOL  Value: 141        ATP III CLASSIFICATION:  <200     mg/dL   Desirable  782-956  mg/dL   Borderline High  >=213    mg/dL   High        0/86/5784   TRIG 90 08/25/2009   HDL 40 08/25/2009   LDLCALC  Value: 83        Total Cholesterol/HDL:CHD Risk Coronary Heart Disease Risk Table                     Men   Women  1/2 Average Risk   3.4   3.3  Average Risk       5.0   4.4  2 X Average Risk   9.6   7.1  3 X Average Risk  23.4   11.0        Use the calculated Patient Ratio above and the CHD Risk Table to determine the patient's CHD Risk.        ATP III CLASSIFICATION (LDL):  <100     mg/dL   Optimal  696-295  mg/dL   Near or Above                    Optimal  130-159  mg/dL   Borderline  284-132  mg/dL   High  >440     mg/dL   Very High 05/16/7251   ALT 12 09/24/2010   AST 17 09/24/2010   NA 142 09/28/2011   K 4.3 09/28/2011   CL 109 09/28/2011   CREATININE 0.8 09/28/2011   BUN 20 09/28/2011   CO2 27 09/28/2011   TSH 0.43 02/12/2011   INR 3.6 10/29/2011    Assessment / Plan:

## 2011-10-29 NOTE — Assessment & Plan Note (Signed)
She is doing well with just support stockings. No change in her current plan. Will see her back in 4 months. Patient is agreeable to this plan and will call if any problems develop in the interim.

## 2011-10-29 NOTE — Patient Instructions (Signed)
I will see you in about 4 months.  Use your support stockings.   No change with your medicines today.  Call the [facility] office at [phone] if you have any questions, problems or concerns.

## 2011-10-29 NOTE — Progress Notes (Signed)
Seen with Talitha Givens, NP.  See PaceArt report.  Device reprogrammed for oversensing EMI on atrial lead.

## 2011-11-12 ENCOUNTER — Ambulatory Visit (INDEPENDENT_AMBULATORY_CARE_PROVIDER_SITE_OTHER): Payer: Medicare Other | Admitting: *Deleted

## 2011-11-12 DIAGNOSIS — I4891 Unspecified atrial fibrillation: Secondary | ICD-10-CM | POA: Diagnosis not present

## 2011-11-12 DIAGNOSIS — Z7901 Long term (current) use of anticoagulants: Secondary | ICD-10-CM

## 2011-11-12 LAB — POCT INR: INR: 3.5

## 2011-11-21 ENCOUNTER — Other Ambulatory Visit: Payer: Self-pay | Admitting: Internal Medicine

## 2011-11-21 ENCOUNTER — Ambulatory Visit
Admission: RE | Admit: 2011-11-21 | Discharge: 2011-11-21 | Disposition: A | Discharge status: Home / Self Care | Payer: Medicare Other | Source: Ambulatory Visit | Attending: Internal Medicine | Admitting: Internal Medicine

## 2011-11-21 DIAGNOSIS — R109 Unspecified abdominal pain: Secondary | ICD-10-CM

## 2011-11-21 DIAGNOSIS — N3289 Other specified disorders of bladder: Secondary | ICD-10-CM | POA: Diagnosis not present

## 2011-11-21 DIAGNOSIS — R10814 Left lower quadrant abdominal tenderness: Secondary | ICD-10-CM | POA: Diagnosis not present

## 2011-11-21 DIAGNOSIS — M549 Dorsalgia, unspecified: Secondary | ICD-10-CM | POA: Diagnosis not present

## 2011-11-21 MED ORDER — IOHEXOL 300 MG/ML  SOLN
100.0000 mL | Freq: Once | INTRAMUSCULAR | Status: AC | PRN
  Administered 2011-11-21: 100 mL via INTRAVENOUS

## 2011-11-21 MED ORDER — IOHEXOL 300 MG/ML  SOLN
10.0000 mL | Freq: Once | INTRAMUSCULAR | Status: AC | PRN
  Administered 2011-11-21: 10 mL via ORAL

## 2011-11-26 ENCOUNTER — Ambulatory Visit (INDEPENDENT_AMBULATORY_CARE_PROVIDER_SITE_OTHER): Payer: Medicare Other | Admitting: *Deleted

## 2011-11-26 DIAGNOSIS — Z85828 Personal history of other malignant neoplasm of skin: Secondary | ICD-10-CM | POA: Diagnosis not present

## 2011-11-26 DIAGNOSIS — Z7901 Long term (current) use of anticoagulants: Secondary | ICD-10-CM | POA: Diagnosis not present

## 2011-11-26 DIAGNOSIS — D235 Other benign neoplasm of skin of trunk: Secondary | ICD-10-CM | POA: Diagnosis not present

## 2011-11-26 DIAGNOSIS — I4891 Unspecified atrial fibrillation: Secondary | ICD-10-CM

## 2011-11-26 DIAGNOSIS — D485 Neoplasm of uncertain behavior of skin: Secondary | ICD-10-CM | POA: Diagnosis not present

## 2011-11-26 LAB — POCT INR: INR: 2.1

## 2011-11-26 MED ORDER — WARFARIN SODIUM 5 MG PO TABS: ORAL_TABLET | ORAL | Status: DC

## 2011-11-28 DIAGNOSIS — R109 Unspecified abdominal pain: Secondary | ICD-10-CM | POA: Diagnosis not present

## 2011-11-28 DIAGNOSIS — IMO0002 Reserved for concepts with insufficient information to code with codable children: Secondary | ICD-10-CM | POA: Diagnosis not present

## 2011-11-29 DIAGNOSIS — M5412 Radiculopathy, cervical region: Secondary | ICD-10-CM | POA: Diagnosis not present

## 2011-12-03 DIAGNOSIS — R279 Unspecified lack of coordination: Secondary | ICD-10-CM | POA: Diagnosis not present

## 2011-12-03 DIAGNOSIS — M6281 Muscle weakness (generalized): Secondary | ICD-10-CM | POA: Diagnosis not present

## 2011-12-03 DIAGNOSIS — M545 Low back pain, unspecified: Secondary | ICD-10-CM | POA: Diagnosis not present

## 2011-12-03 DIAGNOSIS — R293 Abnormal posture: Secondary | ICD-10-CM | POA: Diagnosis not present

## 2011-12-05 DIAGNOSIS — R279 Unspecified lack of coordination: Secondary | ICD-10-CM | POA: Diagnosis not present

## 2011-12-05 DIAGNOSIS — M6281 Muscle weakness (generalized): Secondary | ICD-10-CM | POA: Diagnosis not present

## 2011-12-05 DIAGNOSIS — R293 Abnormal posture: Secondary | ICD-10-CM | POA: Diagnosis not present

## 2011-12-05 DIAGNOSIS — M545 Low back pain, unspecified: Secondary | ICD-10-CM | POA: Diagnosis not present

## 2011-12-06 DIAGNOSIS — R293 Abnormal posture: Secondary | ICD-10-CM | POA: Diagnosis not present

## 2011-12-06 DIAGNOSIS — M545 Low back pain, unspecified: Secondary | ICD-10-CM | POA: Diagnosis not present

## 2011-12-06 DIAGNOSIS — M6281 Muscle weakness (generalized): Secondary | ICD-10-CM | POA: Diagnosis not present

## 2011-12-06 DIAGNOSIS — R279 Unspecified lack of coordination: Secondary | ICD-10-CM | POA: Diagnosis not present

## 2011-12-10 DIAGNOSIS — M6281 Muscle weakness (generalized): Secondary | ICD-10-CM | POA: Diagnosis not present

## 2011-12-10 DIAGNOSIS — R293 Abnormal posture: Secondary | ICD-10-CM | POA: Diagnosis not present

## 2011-12-10 DIAGNOSIS — R279 Unspecified lack of coordination: Secondary | ICD-10-CM | POA: Diagnosis not present

## 2011-12-10 DIAGNOSIS — M545 Low back pain, unspecified: Secondary | ICD-10-CM | POA: Diagnosis not present

## 2011-12-12 DIAGNOSIS — R279 Unspecified lack of coordination: Secondary | ICD-10-CM | POA: Diagnosis not present

## 2011-12-12 DIAGNOSIS — M545 Low back pain, unspecified: Secondary | ICD-10-CM | POA: Diagnosis not present

## 2011-12-12 DIAGNOSIS — M6281 Muscle weakness (generalized): Secondary | ICD-10-CM | POA: Diagnosis not present

## 2011-12-12 DIAGNOSIS — R293 Abnormal posture: Secondary | ICD-10-CM | POA: Diagnosis not present

## 2011-12-13 DIAGNOSIS — R293 Abnormal posture: Secondary | ICD-10-CM | POA: Diagnosis not present

## 2011-12-13 DIAGNOSIS — M545 Low back pain, unspecified: Secondary | ICD-10-CM | POA: Diagnosis not present

## 2011-12-13 DIAGNOSIS — R279 Unspecified lack of coordination: Secondary | ICD-10-CM | POA: Diagnosis not present

## 2011-12-13 DIAGNOSIS — M6281 Muscle weakness (generalized): Secondary | ICD-10-CM | POA: Diagnosis not present

## 2011-12-17 DIAGNOSIS — M6281 Muscle weakness (generalized): Secondary | ICD-10-CM | POA: Diagnosis not present

## 2011-12-17 DIAGNOSIS — M545 Low back pain, unspecified: Secondary | ICD-10-CM | POA: Diagnosis not present

## 2011-12-17 DIAGNOSIS — R293 Abnormal posture: Secondary | ICD-10-CM | POA: Diagnosis not present

## 2011-12-17 DIAGNOSIS — R279 Unspecified lack of coordination: Secondary | ICD-10-CM | POA: Diagnosis not present

## 2011-12-19 DIAGNOSIS — M545 Low back pain, unspecified: Secondary | ICD-10-CM | POA: Diagnosis not present

## 2011-12-19 DIAGNOSIS — R279 Unspecified lack of coordination: Secondary | ICD-10-CM | POA: Diagnosis not present

## 2011-12-19 DIAGNOSIS — R293 Abnormal posture: Secondary | ICD-10-CM | POA: Diagnosis not present

## 2011-12-19 DIAGNOSIS — M6281 Muscle weakness (generalized): Secondary | ICD-10-CM | POA: Diagnosis not present

## 2011-12-20 DIAGNOSIS — M6281 Muscle weakness (generalized): Secondary | ICD-10-CM | POA: Diagnosis not present

## 2011-12-20 DIAGNOSIS — M545 Low back pain, unspecified: Secondary | ICD-10-CM | POA: Diagnosis not present

## 2011-12-20 DIAGNOSIS — R293 Abnormal posture: Secondary | ICD-10-CM | POA: Diagnosis not present

## 2011-12-20 DIAGNOSIS — R279 Unspecified lack of coordination: Secondary | ICD-10-CM | POA: Diagnosis not present

## 2011-12-21 ENCOUNTER — Ambulatory Visit (INDEPENDENT_AMBULATORY_CARE_PROVIDER_SITE_OTHER): Payer: Medicare Other | Admitting: Pharmacist

## 2011-12-21 DIAGNOSIS — Z7901 Long term (current) use of anticoagulants: Secondary | ICD-10-CM

## 2011-12-21 DIAGNOSIS — I4891 Unspecified atrial fibrillation: Secondary | ICD-10-CM | POA: Diagnosis not present

## 2011-12-21 LAB — POCT INR: INR: 2.9

## 2011-12-24 DIAGNOSIS — R293 Abnormal posture: Secondary | ICD-10-CM | POA: Diagnosis not present

## 2011-12-24 DIAGNOSIS — R279 Unspecified lack of coordination: Secondary | ICD-10-CM | POA: Diagnosis not present

## 2011-12-24 DIAGNOSIS — M545 Low back pain, unspecified: Secondary | ICD-10-CM | POA: Diagnosis not present

## 2011-12-24 DIAGNOSIS — M6281 Muscle weakness (generalized): Secondary | ICD-10-CM | POA: Diagnosis not present

## 2011-12-26 DIAGNOSIS — M545 Low back pain, unspecified: Secondary | ICD-10-CM | POA: Diagnosis not present

## 2011-12-26 DIAGNOSIS — R293 Abnormal posture: Secondary | ICD-10-CM | POA: Diagnosis not present

## 2011-12-26 DIAGNOSIS — M6281 Muscle weakness (generalized): Secondary | ICD-10-CM | POA: Diagnosis not present

## 2011-12-26 DIAGNOSIS — R279 Unspecified lack of coordination: Secondary | ICD-10-CM | POA: Diagnosis not present

## 2011-12-27 DIAGNOSIS — M545 Low back pain, unspecified: Secondary | ICD-10-CM | POA: Diagnosis not present

## 2011-12-27 DIAGNOSIS — R279 Unspecified lack of coordination: Secondary | ICD-10-CM | POA: Diagnosis not present

## 2011-12-27 DIAGNOSIS — R293 Abnormal posture: Secondary | ICD-10-CM | POA: Diagnosis not present

## 2011-12-27 DIAGNOSIS — M6281 Muscle weakness (generalized): Secondary | ICD-10-CM | POA: Diagnosis not present

## 2011-12-31 ENCOUNTER — Telehealth: Payer: Self-pay | Admitting: Internal Medicine

## 2011-12-31 ENCOUNTER — Ambulatory Visit (INDEPENDENT_AMBULATORY_CARE_PROVIDER_SITE_OTHER): Payer: Medicare Other | Admitting: *Deleted

## 2011-12-31 ENCOUNTER — Encounter: Payer: Self-pay | Admitting: Internal Medicine

## 2011-12-31 DIAGNOSIS — I4891 Unspecified atrial fibrillation: Secondary | ICD-10-CM

## 2011-12-31 DIAGNOSIS — I442 Atrioventricular block, complete: Secondary | ICD-10-CM

## 2011-12-31 DIAGNOSIS — R0789 Other chest pain: Secondary | ICD-10-CM

## 2011-12-31 NOTE — Progress Notes (Signed)
Pacer check in clinic  

## 2012-01-01 LAB — PACEMAKER DEVICE OBSERVATION
AL IMPEDENCE PM: 624 Ohm
AL THRESHOLD: 1.75 V
ATRIAL PACING PM: 12.7
BAMS-0001: 150 {beats}/min
BATTERY VOLTAGE: 2.8 V
RV LEAD IMPEDENCE PM: 894 Ohm
RV LEAD THRESHOLD: 1.5 V
VENTRICULAR PACING PM: 99.9

## 2012-01-09 DIAGNOSIS — G609 Hereditary and idiopathic neuropathy, unspecified: Secondary | ICD-10-CM | POA: Diagnosis not present

## 2012-01-09 DIAGNOSIS — Z23 Encounter for immunization: Secondary | ICD-10-CM | POA: Diagnosis not present

## 2012-01-09 DIAGNOSIS — E039 Hypothyroidism, unspecified: Secondary | ICD-10-CM | POA: Diagnosis not present

## 2012-01-09 DIAGNOSIS — F411 Generalized anxiety disorder: Secondary | ICD-10-CM | POA: Diagnosis not present

## 2012-01-09 DIAGNOSIS — E785 Hyperlipidemia, unspecified: Secondary | ICD-10-CM | POA: Diagnosis not present

## 2012-01-16 ENCOUNTER — Encounter: Payer: Self-pay | Admitting: Nurse Practitioner

## 2012-01-16 ENCOUNTER — Ambulatory Visit (INDEPENDENT_AMBULATORY_CARE_PROVIDER_SITE_OTHER): Payer: Medicare Other | Admitting: *Deleted

## 2012-01-16 ENCOUNTER — Ambulatory Visit (INDEPENDENT_AMBULATORY_CARE_PROVIDER_SITE_OTHER): Payer: Medicare Other | Admitting: Nurse Practitioner

## 2012-01-16 VITALS — BP 110/62 | HR 78 | Ht 61.0 in | Wt 145.0 lb

## 2012-01-16 DIAGNOSIS — Z7901 Long term (current) use of anticoagulants: Secondary | ICD-10-CM | POA: Diagnosis not present

## 2012-01-16 DIAGNOSIS — R0789 Other chest pain: Secondary | ICD-10-CM

## 2012-01-16 DIAGNOSIS — I4891 Unspecified atrial fibrillation: Secondary | ICD-10-CM | POA: Diagnosis not present

## 2012-01-16 LAB — POCT INR: INR: 2.1

## 2012-01-16 NOTE — Progress Notes (Signed)
[name] Date of Birth: December 20, 1922 Medical Record #[id number]  History of Present Illness: [name] is seen back today for a work in visit. She is seen for Dr. Johney Frame. She has prior PE/DVT and is on chronic coumadin. She has a pacemaker in place. She did have some type of undefined neurologic disorder several years ago which resolved and was never fully understood.   She comes in today. She is here with her husband. She had her pacemaker checked last month and was complaining of chest pain. She had had her sensitivity changed at her prior pacemaker visit and thought there was something wrong with the pacemaker. Her most recent checked showed less atrial fib and her device was functioning satisfactorily. EKG was done and was ok. She was asked to follow up. She is now doing well. She really can't tell me too much of the details but that she is ok. She is more worried about her husband who is having a nosebleed. They are both scheduled to get their INR's on Friday. She denies chest pain. She uses her walker. No falls.   Current Outpatient Prescriptions on File Prior to Visit  Medication Sig Dispense Refill  . Calcium Carbonate (CALCIUM-CARB 600 PO) Take by mouth daily.        . cholecalciferol (VITAMIN D) 1000 UNITS tablet Take 1,000 Units by mouth as directed.        . citalopram (CELEXA) 20 MG tablet Take 10 mg by mouth daily.      [name] donepezil (ARICEPT) 10 MG tablet Take 5 mg by mouth daily.       [name] HYDROCODONE-CHLORPHENIRAMINE PO Take by mouth as needed.      [name] HYDROcodone-homatropine (HYCODAN) 5-1.5 MG/5ML syrup Take 2.5 mLs by mouth as needed.      . levETIRAcetam (KEPPRA) 250 MG tablet Take 125 mg by mouth as needed.       [name] levothyroxine (SYNTHROID, LEVOTHROID) 50 MCG tablet Take 50 mcg by mouth daily.        [name] loratadine (CLARITIN) 10 MG tablet Take 10 mg by mouth as needed.      . memantine (NAMENDA) 10 MG tablet Take 5 mg by mouth 2 (two) times daily.       . Multiple Vitamin  (MULTIVITAMIN) capsule Take 1 capsule by mouth daily.        [name] NITROSTAT 0.4 MG SL tablet Place 0.4 mg under the tongue every 5 (five) minutes as needed.       [name] omeprazole (PRILOSEC) 20 MG capsule Take 20 mg by mouth 2 (two) times daily.       . pravastatin (PRAVACHOL) 20 MG tablet Take 20 mg by mouth daily.        . salsalate (DISALCID) 750 MG tablet Take 750 mg by mouth 2 (two) times daily.        Bernadette Hoit Sodium (SENNA-S PO) Take 1 tablet by mouth 2 (two) times daily.       [name] warfarin (COUMADIN) 5 MG tablet Take as directed by coumadin clinic  40 tablet  3  . alendronate (FOSAMAX) 35 MG tablet Take 35 mg by mouth every 7 (seven) days. Take with a full glass of water on an empty stomach.        No Known Allergies  Past Medical History  Diagnosis Date  . Arthritis   . Depression   . Hyperlipidemia   . Hypothyroidism   . HB (heart block)     s/p PPM,  most recent generator change 12/11 by JA  . Dementia   . Hiatal hernia   . PTE (post-transplant erythrocytosis)   . Atrial fibrillation   . Pulmonary embolism 2011    on coumadin  . DVT (deep venous thrombosis) 2011  . UTI (lower urinary tract infection)     Past Surgical History  Procedure Date  . Hemorrhoid surgery   . Pacemaker     most recent generator 12/11 by JA  . Hysterectomy (other)   . Cholecystectomy 09/2010    History  Smoking status  . Never Smoker   Smokeless tobacco  . Not on file    History  Alcohol Use No    Family History  Problem Relation Age of Onset  . Stomach cancer Brother   . Stomach cancer Brother   . Liver cancer Brother   . Stroke Father   . Heart disease Father   . Arthritis Father   . Breast cancer Sister   . Heart disease Mother   . Arthritis Mother     Review of Systems: The review of systems is per the HPI.  All other systems were reviewed and are negative.  Physical Exam: BP 110/62  Pulse 78  Ht 5\' 1"  (1.549 m)  Wt 145 lb (65.772 kg)  BMI 27.40  kg/m2 Patient is very pleasant and in no acute distress. She is using a walker. Skin is warm and dry. Color is normal.  HEENT is unremarkable. Normocephalic/atraumatic. PERRL. Sclera are nonicteric. Neck is supple. No masses. No JVD. Lungs are clear. Cardiac exam shows a regular rate and rhythm (presumed paced) Abdomen is soft. Extremities are with trace edema left greater than right. Some venous stasis changes. Gait and ROM are intact. No gross neurologic deficits noted.   LABORATORY DATA:  Lab Results  Component Value Date   WBC 6.0 05/04/2011   HGB 14.1 05/04/2011   HCT 41.6 05/04/2011   PLT 198 05/04/2011   GLUCOSE 118* 09/28/2011   CHOL  Value: 141        ATP III CLASSIFICATION:  <200     mg/dL   Desirable  454-098  mg/dL   Borderline High  >=119    mg/dL   High        1/47/8295   TRIG 90 08/25/2009   HDL 40 08/25/2009   LDLCALC  Value: 83        Total Cholesterol/HDL:CHD Risk Coronary Heart Disease Risk Table                     Men   Women  1/2 Average Risk   3.4   3.3  Average Risk       5.0   4.4  2 X Average Risk   9.6   7.1  3 X Average Risk  23.4   11.0        Use the calculated Patient Ratio above and the CHD Risk Table to determine the patient's CHD Risk.        ATP III CLASSIFICATION (LDL):  <100     mg/dL   Optimal  621-308  mg/dL   Near or Above                    Optimal  130-159  mg/dL   Borderline  657-846  mg/dL   High  >962     mg/dL   Very High 9/52/8413   ALT 12 09/24/2010   AST 17 09/24/2010  NA 142 09/28/2011   K 4.3 09/28/2011   CL 109 09/28/2011   CREATININE 0.8 09/28/2011   BUN 20 09/28/2011   CO2 27 09/28/2011   TSH 0.43 02/12/2011   INR 2.1 01/16/2012      Assessment / Plan:  1. Atypical chest pain - she is doing well. She really can't tell me too much of what she felt 2 weeks ago. Seems fine now. No change in her medicines.   2. PTVP - She will see Dr. Johney Frame with a pacer check in November.  3. PAF   4. Chronic coumadin - will go ahead and check both of their  INR's today.  Patient is agreeable to this plan and will call if any problems develop in the interim.

## 2012-01-16 NOTE — Patient Instructions (Addendum)
See Dr. Johney Frame in November  We will check your coumadin today  Stay on your current medicines  Call the [facility] office at [phone] if you have any questions, problems or concerns.

## 2012-01-23 ENCOUNTER — Encounter: Payer: Self-pay | Admitting: *Deleted

## 2012-02-04 DIAGNOSIS — E039 Hypothyroidism, unspecified: Secondary | ICD-10-CM | POA: Diagnosis not present

## 2012-02-04 DIAGNOSIS — Z0000 Encounter for general adult medical examination without abnormal findings: Secondary | ICD-10-CM | POA: Diagnosis not present

## 2012-02-04 DIAGNOSIS — E785 Hyperlipidemia, unspecified: Secondary | ICD-10-CM | POA: Diagnosis not present

## 2012-02-04 DIAGNOSIS — M810 Age-related osteoporosis without current pathological fracture: Secondary | ICD-10-CM | POA: Diagnosis not present

## 2012-02-11 ENCOUNTER — Other Ambulatory Visit: Payer: Self-pay | Admitting: Internal Medicine

## 2012-02-11 ENCOUNTER — Other Ambulatory Visit: Payer: Medicare Other

## 2012-02-11 DIAGNOSIS — R609 Edema, unspecified: Secondary | ICD-10-CM

## 2012-02-11 DIAGNOSIS — M810 Age-related osteoporosis without current pathological fracture: Secondary | ICD-10-CM | POA: Diagnosis not present

## 2012-02-11 DIAGNOSIS — E785 Hyperlipidemia, unspecified: Secondary | ICD-10-CM | POA: Diagnosis not present

## 2012-02-11 DIAGNOSIS — M79609 Pain in unspecified limb: Secondary | ICD-10-CM | POA: Diagnosis not present

## 2012-02-11 DIAGNOSIS — R52 Pain, unspecified: Secondary | ICD-10-CM

## 2012-02-11 DIAGNOSIS — Z23 Encounter for immunization: Secondary | ICD-10-CM | POA: Diagnosis not present

## 2012-02-11 DIAGNOSIS — E039 Hypothyroidism, unspecified: Secondary | ICD-10-CM | POA: Diagnosis not present

## 2012-02-12 ENCOUNTER — Ambulatory Visit
Admission: RE | Admit: 2012-02-12 | Discharge: 2012-02-12 | Disposition: A | Discharge status: Home / Self Care | Payer: Medicare Other | Source: Ambulatory Visit | Attending: Internal Medicine | Admitting: Internal Medicine

## 2012-02-12 ENCOUNTER — Other Ambulatory Visit: Payer: Medicare Other

## 2012-02-12 DIAGNOSIS — I82819 Embolism and thrombosis of superficial veins of unspecified lower extremities: Secondary | ICD-10-CM | POA: Diagnosis not present

## 2012-02-12 DIAGNOSIS — R609 Edema, unspecified: Secondary | ICD-10-CM

## 2012-02-12 DIAGNOSIS — M7989 Other specified soft tissue disorders: Secondary | ICD-10-CM | POA: Diagnosis not present

## 2012-02-12 DIAGNOSIS — R52 Pain, unspecified: Secondary | ICD-10-CM

## 2012-02-12 DIAGNOSIS — M79609 Pain in unspecified limb: Secondary | ICD-10-CM | POA: Diagnosis not present

## 2012-02-14 ENCOUNTER — Other Ambulatory Visit (HOSPITAL_COMMUNITY): Payer: Self-pay

## 2012-02-14 ENCOUNTER — Ambulatory Visit (HOSPITAL_COMMUNITY): Payer: Medicare Other | Attending: Cardiology

## 2012-02-14 DIAGNOSIS — I379 Nonrheumatic pulmonary valve disorder, unspecified: Secondary | ICD-10-CM | POA: Insufficient documentation

## 2012-02-14 DIAGNOSIS — I059 Rheumatic mitral valve disease, unspecified: Secondary | ICD-10-CM | POA: Diagnosis not present

## 2012-02-14 DIAGNOSIS — I509 Heart failure, unspecified: Secondary | ICD-10-CM | POA: Diagnosis not present

## 2012-02-14 DIAGNOSIS — Z86718 Personal history of other venous thrombosis and embolism: Secondary | ICD-10-CM | POA: Diagnosis not present

## 2012-02-14 DIAGNOSIS — Z86711 Personal history of pulmonary embolism: Secondary | ICD-10-CM | POA: Diagnosis not present

## 2012-02-14 DIAGNOSIS — I10 Essential (primary) hypertension: Secondary | ICD-10-CM | POA: Insufficient documentation

## 2012-02-14 DIAGNOSIS — I369 Nonrheumatic tricuspid valve disorder, unspecified: Secondary | ICD-10-CM | POA: Diagnosis not present

## 2012-02-14 DIAGNOSIS — I4891 Unspecified atrial fibrillation: Secondary | ICD-10-CM | POA: Insufficient documentation

## 2012-02-14 DIAGNOSIS — R011 Cardiac murmur, unspecified: Secondary | ICD-10-CM | POA: Diagnosis not present

## 2012-02-14 NOTE — Progress Notes (Signed)
Echocardiogram performed.  

## 2012-02-15 ENCOUNTER — Encounter (HOSPITAL_COMMUNITY): Payer: Self-pay | Admitting: Internal Medicine

## 2012-02-19 DIAGNOSIS — L570 Actinic keratosis: Secondary | ICD-10-CM | POA: Diagnosis not present

## 2012-02-19 DIAGNOSIS — Z85828 Personal history of other malignant neoplasm of skin: Secondary | ICD-10-CM | POA: Diagnosis not present

## 2012-02-20 ENCOUNTER — Ambulatory Visit (INDEPENDENT_AMBULATORY_CARE_PROVIDER_SITE_OTHER): Payer: Medicare Other | Admitting: *Deleted

## 2012-02-20 DIAGNOSIS — Z7901 Long term (current) use of anticoagulants: Secondary | ICD-10-CM | POA: Diagnosis not present

## 2012-02-20 DIAGNOSIS — I4891 Unspecified atrial fibrillation: Secondary | ICD-10-CM

## 2012-02-20 LAB — POCT INR: INR: 1.4

## 2012-02-29 ENCOUNTER — Ambulatory Visit (INDEPENDENT_AMBULATORY_CARE_PROVIDER_SITE_OTHER): Payer: Medicare Other | Admitting: *Deleted

## 2012-02-29 DIAGNOSIS — I4891 Unspecified atrial fibrillation: Secondary | ICD-10-CM

## 2012-02-29 DIAGNOSIS — Z7901 Long term (current) use of anticoagulants: Secondary | ICD-10-CM

## 2012-02-29 LAB — POCT INR: INR: 2.8

## 2012-03-07 NOTE — Addendum Note (Signed)
Addended by: Forestine Chute on: 03/07/2012 09:25 AM   Modules accepted: Orders

## 2012-03-10 DIAGNOSIS — IMO0002 Reserved for concepts with insufficient information to code with codable children: Secondary | ICD-10-CM | POA: Diagnosis not present

## 2012-03-10 DIAGNOSIS — Z23 Encounter for immunization: Secondary | ICD-10-CM | POA: Diagnosis not present

## 2012-03-10 DIAGNOSIS — G3185 Corticobasal degeneration: Secondary | ICD-10-CM | POA: Diagnosis not present

## 2012-03-10 DIAGNOSIS — M1710 Unilateral primary osteoarthritis, unspecified knee: Secondary | ICD-10-CM | POA: Diagnosis not present

## 2012-03-11 ENCOUNTER — Encounter: Payer: Self-pay | Admitting: *Deleted

## 2012-03-11 DIAGNOSIS — I442 Atrioventricular block, complete: Secondary | ICD-10-CM | POA: Insufficient documentation

## 2012-03-11 DIAGNOSIS — Z950 Presence of cardiac pacemaker: Secondary | ICD-10-CM | POA: Insufficient documentation

## 2012-03-14 ENCOUNTER — Telehealth: Payer: Self-pay | Admitting: Physician Assistant

## 2012-03-14 NOTE — Telephone Encounter (Signed)
Patient's husband called re: pain under L breast attributed to gas since last night. He states she experienced L sided, sharp, chest pain w/o radiation that changes in quality on belching, but has not relieved. No association w/ exertion, meals, SOB, nausea, diaphoresis, palpitations or lightheadedness. No n/v/d. No relief with NTG SL x 3. No relief w/ Tums. Pain is mild, but unable to quantify. It is aggravated on palpation. No change w/ deep inspiration or cough. No unilateral leg swelling/pain/redness. Advised that chest discomfort is quite atypical for cardiac pain and more c/w reflux/indigestion or musculoskeletal etiologies. Advised to continue to monitor tonight and through the weekend, and if worsens or changes (dull, substernal, radiating to arm or neck, assoc w/ exertion/SOB/diaphoresis/nausea, etc) to present to the ED. In the meantime, advised to try Maalox, Pepto bismol or Zantac for relief. They understood and agreed. Of note, patient apparently has follow-up on Monday with Dr. Johney Frame. This can be reassessed at that time.    Jacqulyn Bath, PA-C 03/14/2012 9:15 PM

## 2012-03-17 ENCOUNTER — Encounter: Payer: Self-pay | Admitting: Internal Medicine

## 2012-03-17 ENCOUNTER — Ambulatory Visit (INDEPENDENT_AMBULATORY_CARE_PROVIDER_SITE_OTHER): Payer: Medicare Other | Admitting: Internal Medicine

## 2012-03-17 ENCOUNTER — Ambulatory Visit (INDEPENDENT_AMBULATORY_CARE_PROVIDER_SITE_OTHER): Payer: Medicare Other | Admitting: Pharmacist

## 2012-03-17 VITALS — BP 98/52 | HR 74 | Ht 61.0 in | Wt 146.0 lb

## 2012-03-17 DIAGNOSIS — Z950 Presence of cardiac pacemaker: Secondary | ICD-10-CM

## 2012-03-17 DIAGNOSIS — I442 Atrioventricular block, complete: Secondary | ICD-10-CM

## 2012-03-17 DIAGNOSIS — R0789 Other chest pain: Secondary | ICD-10-CM

## 2012-03-17 DIAGNOSIS — I4891 Unspecified atrial fibrillation: Secondary | ICD-10-CM | POA: Diagnosis not present

## 2012-03-17 DIAGNOSIS — Z7901 Long term (current) use of anticoagulants: Secondary | ICD-10-CM | POA: Diagnosis not present

## 2012-03-17 LAB — PACEMAKER DEVICE OBSERVATION
AL AMPLITUDE: 1.4 mv
AL IMPEDENCE PM: 633 Ohm
AL THRESHOLD: 1.25 V
ATRIAL PACING PM: 12
BAMS-0001: 150 {beats}/min
BATTERY VOLTAGE: 2.8 V
RV LEAD AMPLITUDE: 15.68 mv
RV LEAD IMPEDENCE PM: 892 Ohm
RV LEAD THRESHOLD: 1.25 V
VENTRICULAR PACING PM: 100

## 2012-03-17 LAB — POCT INR: INR: 2.1

## 2012-03-17 MED ORDER — WARFARIN SODIUM 5 MG PO TABS: ORAL_TABLET | ORAL | Status: DC

## 2012-03-17 NOTE — Patient Instructions (Signed)
Your physician wants you to follow-up in: 6 months with Sunday Spillers and 12 months with Dr Vergia Alcon will receive a reminder letter in the mail two months in advance. If you don't receive a letter, please call our office to schedule the follow-up appointment.  Remote monitoring is used to monitor your Pacemaker of ICD from home. This monitoring reduces the number of office visits required to check your device to one time per year. It allows [place] to keep an eye on the functioning of your device to ensure it is working properly. You are scheduled for a device check from home on 06/23/12. You may send your transmission at any time that day. If you have a wireless device, the transmission will be sent automatically. After your physician reviews your transmission, you will receive a postcard with your next transmission date.

## 2012-03-17 NOTE — Progress Notes (Signed)
PCP: Thayer Headings, MD  [name] is a 78 y.o. female who presents today for routine electrophysiology followup.  Since last being seen in our clinic, the patient reports doing reasonably well.  She has occasional atypical chest pain.  Today, she denies symptoms of palpitations, exertional chest pain, shortness of breath,  lower extremity edema, dizziness, presyncope, or syncope.  The patient is otherwise without complaint today.   Past Medical History  Diagnosis Date  . Arthritis   . Depression   . Hyperlipidemia   . Hypothyroidism   . HB (heart block)     s/p PPM, most recent generator change 12/11 by JA  . Dementia   . Hiatal hernia   . PTE (post-transplant erythrocytosis)   . Atrial fibrillation   . Pulmonary embolism 2011    on coumadin  . DVT (deep venous thrombosis) 2011  . UTI (lower urinary tract infection)    Past Surgical History  Procedure Date  . Hemorrhoid surgery   . Pacemaker     most recent generator 12/11 by JA  . Hysterectomy (other)   . Cholecystectomy 09/2010    Current Outpatient Prescriptions  Medication Sig Dispense Refill  . Calcium Carbonate (CALCIUM-CARB 600 PO) Take by mouth daily.        . cholecalciferol (VITAMIN D) 1000 UNITS tablet Take 1,000 Units by mouth as directed.        . citalopram (CELEXA) 20 MG tablet Take 10 mg by mouth daily.      [name] donepezil (ARICEPT) 10 MG tablet Take 5 mg by mouth daily.       [name] levETIRAcetam (KEPPRA) 250 MG tablet Take 125 mg by mouth as needed.       [name] levothyroxine (SYNTHROID, LEVOTHROID) 50 MCG tablet Take 50 mcg by mouth daily.        [name] loratadine (CLARITIN) 10 MG tablet Take 10 mg by mouth as needed.      . memantine (NAMENDA) 10 MG tablet Take 5 mg by mouth 2 (two) times daily.       . Multiple Vitamin (MULTIVITAMIN) capsule Take 1 capsule by mouth daily.        [name] NITROSTAT 0.4 MG SL tablet Place 0.4 mg under the tongue every 5 (five) minutes as needed.       [name] omeprazole (PRILOSEC) 20 MG  capsule Take 20 mg by mouth 2 (two) times daily.       . pravastatin (PRAVACHOL) 20 MG tablet Take 20 mg by mouth daily.        . salsalate (DISALCID) 750 MG tablet Take 750 mg by mouth 2 (two) times daily.        Bernadette Hoit Sodium (SENNA-S PO) Take 1 tablet by mouth 2 (two) times daily.       . [DISCONTINUED] warfarin (COUMADIN) 5 MG tablet Take as directed by coumadin clinic  40 tablet  3  . alendronate (FOSAMAX) 35 MG tablet Take 35 mg by mouth every 7 (seven) days. Take with a full glass of water on an empty stomach.      [name] HYDROCODONE-CHLORPHENIRAMINE PO Take by mouth as needed.      [name] HYDROcodone-homatropine (HYCODAN) 5-1.5 MG/5ML syrup Take 2.5 mLs by mouth as needed.      . warfarin (COUMADIN) 5 MG tablet Take as directed by coumadin clinic  90 tablet  1    Physical Exam: Filed Vitals:   03/17/12 1211  BP: 98/52  Pulse: 74  Height: 5\' 1"  (1.549 m)  Weight: 146 lb (66.225 kg)  SpO2: 94%    GEN- The patient is well appearing, alert and oriented x 3 today.   Head- normocephalic, atraumatic Eyes-  Sclera clear, conjunctiva pink Ears- hearing intact Oropharynx- clear Lungs- Clear to ausculation bilaterally, normal work of breathing Chest- pacemaker pocket is well healed Heart- Regular rate and rhythm, no murmurs, rubs or gallops, PMI not laterally displaced GI- soft, NT, ND, + BS Extremities- no clubbing, cyanosis, or edema  Pacemaker interrogation- reviewed in detail today,  See PACEART report  Assessment and Plan:  1. Complete heart block Normal pacemaker function See Pace Art report No changes today  2. Atypical chest pain Improved Normal myoview 10/12 reviewed with patient today Her symptoms are musculoskeletal by history  3. Afib/ h/o PTE Continue long term anticoagulation with coumadin

## 2012-03-25 DIAGNOSIS — G3189 Other specified degenerative diseases of nervous system: Secondary | ICD-10-CM | POA: Diagnosis not present

## 2012-03-25 DIAGNOSIS — F411 Generalized anxiety disorder: Secondary | ICD-10-CM | POA: Diagnosis not present

## 2012-04-07 ENCOUNTER — Encounter (INDEPENDENT_AMBULATORY_CARE_PROVIDER_SITE_OTHER): Payer: Medicare Other | Admitting: *Deleted

## 2012-04-07 DIAGNOSIS — I4891 Unspecified atrial fibrillation: Secondary | ICD-10-CM

## 2012-04-07 DIAGNOSIS — Z7901 Long term (current) use of anticoagulants: Secondary | ICD-10-CM | POA: Diagnosis not present

## 2012-04-07 LAB — POCT INR: INR: 2.5

## 2012-04-16 DIAGNOSIS — H60399 Other infective otitis externa, unspecified ear: Secondary | ICD-10-CM | POA: Diagnosis not present

## 2012-05-05 ENCOUNTER — Ambulatory Visit (INDEPENDENT_AMBULATORY_CARE_PROVIDER_SITE_OTHER): Payer: Medicare Other

## 2012-05-05 DIAGNOSIS — I4891 Unspecified atrial fibrillation: Secondary | ICD-10-CM | POA: Diagnosis not present

## 2012-05-05 DIAGNOSIS — Z7901 Long term (current) use of anticoagulants: Secondary | ICD-10-CM | POA: Diagnosis not present

## 2012-05-05 LAB — POCT INR: INR: 3.7

## 2012-05-27 ENCOUNTER — Ambulatory Visit (INDEPENDENT_AMBULATORY_CARE_PROVIDER_SITE_OTHER): Payer: Medicare Other | Admitting: *Deleted

## 2012-05-27 DIAGNOSIS — I4891 Unspecified atrial fibrillation: Secondary | ICD-10-CM | POA: Diagnosis not present

## 2012-05-27 DIAGNOSIS — Z7901 Long term (current) use of anticoagulants: Secondary | ICD-10-CM

## 2012-05-27 LAB — POCT INR: INR: 3.1

## 2012-05-28 DIAGNOSIS — Z7901 Long term (current) use of anticoagulants: Secondary | ICD-10-CM | POA: Diagnosis not present

## 2012-05-28 DIAGNOSIS — K219 Gastro-esophageal reflux disease without esophagitis: Secondary | ICD-10-CM | POA: Diagnosis not present

## 2012-05-28 DIAGNOSIS — IMO0002 Reserved for concepts with insufficient information to code with codable children: Secondary | ICD-10-CM | POA: Diagnosis not present

## 2012-06-10 ENCOUNTER — Ambulatory Visit (INDEPENDENT_AMBULATORY_CARE_PROVIDER_SITE_OTHER): Payer: Medicare Other | Admitting: *Deleted

## 2012-06-10 DIAGNOSIS — Z7901 Long term (current) use of anticoagulants: Secondary | ICD-10-CM | POA: Diagnosis not present

## 2012-06-10 DIAGNOSIS — I4891 Unspecified atrial fibrillation: Secondary | ICD-10-CM | POA: Diagnosis not present

## 2012-06-10 LAB — POCT INR: INR: 5.8

## 2012-06-17 ENCOUNTER — Ambulatory Visit (INDEPENDENT_AMBULATORY_CARE_PROVIDER_SITE_OTHER): Payer: Medicare Other | Admitting: *Deleted

## 2012-06-17 DIAGNOSIS — Z7901 Long term (current) use of anticoagulants: Secondary | ICD-10-CM

## 2012-06-17 DIAGNOSIS — I4891 Unspecified atrial fibrillation: Secondary | ICD-10-CM | POA: Diagnosis not present

## 2012-06-17 LAB — POCT INR: INR: 1.4

## 2012-06-23 ENCOUNTER — Ambulatory Visit (INDEPENDENT_AMBULATORY_CARE_PROVIDER_SITE_OTHER): Payer: Medicare Other | Admitting: *Deleted

## 2012-06-23 ENCOUNTER — Other Ambulatory Visit: Payer: Self-pay | Admitting: Internal Medicine

## 2012-06-23 DIAGNOSIS — Z950 Presence of cardiac pacemaker: Secondary | ICD-10-CM | POA: Diagnosis not present

## 2012-06-23 DIAGNOSIS — I442 Atrioventricular block, complete: Secondary | ICD-10-CM

## 2012-06-26 LAB — REMOTE PACEMAKER DEVICE
AL IMPEDENCE PM: 613 Ohm
AL THRESHOLD: 1.75 V
ATRIAL PACING PM: 14
BAMS-0001: 150 {beats}/min
BATTERY VOLTAGE: 2.8 V
RV LEAD IMPEDENCE PM: 860 Ohm
RV LEAD THRESHOLD: 1.25 V
VENTRICULAR PACING PM: 100

## 2012-06-30 ENCOUNTER — Ambulatory Visit: Payer: Self-pay | Admitting: Cardiology

## 2012-06-30 DIAGNOSIS — Z7901 Long term (current) use of anticoagulants: Secondary | ICD-10-CM | POA: Diagnosis not present

## 2012-06-30 DIAGNOSIS — I4891 Unspecified atrial fibrillation: Secondary | ICD-10-CM | POA: Diagnosis not present

## 2012-06-30 LAB — PROTIME-INR: INR: 2 — AB (ref 0.9–1.1)

## 2012-07-08 ENCOUNTER — Encounter: Payer: Self-pay | Admitting: *Deleted

## 2012-07-11 ENCOUNTER — Encounter: Payer: Self-pay | Admitting: Internal Medicine

## 2012-07-14 ENCOUNTER — Ambulatory Visit: Payer: Self-pay | Admitting: Cardiovascular Disease

## 2012-07-14 DIAGNOSIS — I4891 Unspecified atrial fibrillation: Secondary | ICD-10-CM

## 2012-07-14 DIAGNOSIS — Z7901 Long term (current) use of anticoagulants: Secondary | ICD-10-CM

## 2012-07-14 LAB — PROTIME-INR: INR: 1.8 — AB (ref 0.9–1.1)

## 2012-07-28 ENCOUNTER — Ambulatory Visit (INDEPENDENT_AMBULATORY_CARE_PROVIDER_SITE_OTHER): Payer: Medicare Other | Admitting: Cardiology

## 2012-07-28 DIAGNOSIS — I4891 Unspecified atrial fibrillation: Secondary | ICD-10-CM

## 2012-07-28 DIAGNOSIS — Z7901 Long term (current) use of anticoagulants: Secondary | ICD-10-CM

## 2012-07-28 LAB — PROTIME-INR: INR: 1.9 — AB (ref 0.9–1.1)

## 2012-07-30 DIAGNOSIS — M810 Age-related osteoporosis without current pathological fracture: Secondary | ICD-10-CM | POA: Diagnosis not present

## 2012-07-30 DIAGNOSIS — E785 Hyperlipidemia, unspecified: Secondary | ICD-10-CM | POA: Diagnosis not present

## 2012-07-30 DIAGNOSIS — E039 Hypothyroidism, unspecified: Secondary | ICD-10-CM | POA: Diagnosis not present

## 2012-08-05 DIAGNOSIS — F411 Generalized anxiety disorder: Secondary | ICD-10-CM | POA: Diagnosis not present

## 2012-08-05 DIAGNOSIS — E785 Hyperlipidemia, unspecified: Secondary | ICD-10-CM | POA: Diagnosis not present

## 2012-08-05 DIAGNOSIS — E039 Hypothyroidism, unspecified: Secondary | ICD-10-CM | POA: Diagnosis not present

## 2012-08-05 DIAGNOSIS — G609 Hereditary and idiopathic neuropathy, unspecified: Secondary | ICD-10-CM | POA: Diagnosis not present

## 2012-08-11 ENCOUNTER — Ambulatory Visit (INDEPENDENT_AMBULATORY_CARE_PROVIDER_SITE_OTHER): Payer: Medicare Other | Admitting: Cardiology

## 2012-08-11 DIAGNOSIS — Z7901 Long term (current) use of anticoagulants: Secondary | ICD-10-CM | POA: Diagnosis not present

## 2012-08-11 DIAGNOSIS — I4891 Unspecified atrial fibrillation: Secondary | ICD-10-CM | POA: Diagnosis not present

## 2012-08-11 LAB — PROTIME-INR: INR: 2.7 — AB (ref <=1.1)

## 2012-09-01 ENCOUNTER — Ambulatory Visit (INDEPENDENT_AMBULATORY_CARE_PROVIDER_SITE_OTHER): Payer: Medicare Other | Admitting: Cardiology

## 2012-09-01 DIAGNOSIS — I4891 Unspecified atrial fibrillation: Secondary | ICD-10-CM | POA: Diagnosis not present

## 2012-09-01 DIAGNOSIS — Z7901 Long term (current) use of anticoagulants: Secondary | ICD-10-CM

## 2012-09-01 LAB — PROTIME-INR: INR: 2.6 — AB (ref 0.9–1.1)

## 2012-09-22 ENCOUNTER — Ambulatory Visit (INDEPENDENT_AMBULATORY_CARE_PROVIDER_SITE_OTHER): Payer: Medicare Other | Admitting: *Deleted

## 2012-09-22 ENCOUNTER — Ambulatory Visit (INDEPENDENT_AMBULATORY_CARE_PROVIDER_SITE_OTHER): Payer: Medicare Other | Admitting: Cardiovascular Disease

## 2012-09-22 DIAGNOSIS — I442 Atrioventricular block, complete: Secondary | ICD-10-CM | POA: Diagnosis not present

## 2012-09-22 DIAGNOSIS — I4891 Unspecified atrial fibrillation: Secondary | ICD-10-CM | POA: Diagnosis not present

## 2012-09-22 DIAGNOSIS — Z950 Presence of cardiac pacemaker: Secondary | ICD-10-CM | POA: Diagnosis not present

## 2012-09-22 DIAGNOSIS — Z7901 Long term (current) use of anticoagulants: Secondary | ICD-10-CM | POA: Diagnosis not present

## 2012-09-22 LAB — PROTIME-INR: INR: 2.3 — AB (ref 0.9–1.1)

## 2012-09-24 DIAGNOSIS — F411 Generalized anxiety disorder: Secondary | ICD-10-CM | POA: Diagnosis not present

## 2012-09-24 DIAGNOSIS — G3189 Other specified degenerative diseases of nervous system: Secondary | ICD-10-CM | POA: Diagnosis not present

## 2012-10-01 DIAGNOSIS — D235 Other benign neoplasm of skin of trunk: Secondary | ICD-10-CM | POA: Diagnosis not present

## 2012-10-01 DIAGNOSIS — D1801 Hemangioma of skin and subcutaneous tissue: Secondary | ICD-10-CM | POA: Diagnosis not present

## 2012-10-01 DIAGNOSIS — L259 Unspecified contact dermatitis, unspecified cause: Secondary | ICD-10-CM | POA: Diagnosis not present

## 2012-10-01 DIAGNOSIS — Z85828 Personal history of other malignant neoplasm of skin: Secondary | ICD-10-CM | POA: Diagnosis not present

## 2012-10-07 ENCOUNTER — Other Ambulatory Visit: Payer: Self-pay | Admitting: *Deleted

## 2012-10-07 LAB — REMOTE PACEMAKER DEVICE
AL IMPEDENCE PM: 624 Ohm
AL THRESHOLD: 1.625 V
ATRIAL PACING PM: 17
BAMS-0001: 150 {beats}/min
BATTERY VOLTAGE: 2.8 V
RV LEAD IMPEDENCE PM: 894 Ohm
RV LEAD THRESHOLD: 1.5 V
VENTRICULAR PACING PM: 100

## 2012-10-07 MED ORDER — WARFARIN SODIUM 5 MG PO TABS: ORAL_TABLET | ORAL | Status: DC

## 2012-10-16 ENCOUNTER — Encounter: Payer: Self-pay | Admitting: *Deleted

## 2012-10-17 ENCOUNTER — Encounter: Payer: Self-pay | Admitting: *Deleted

## 2012-10-20 ENCOUNTER — Ambulatory Visit (INDEPENDENT_AMBULATORY_CARE_PROVIDER_SITE_OTHER): Payer: Medicare Other | Admitting: Cardiovascular Disease

## 2012-10-20 DIAGNOSIS — Z7901 Long term (current) use of anticoagulants: Secondary | ICD-10-CM | POA: Diagnosis not present

## 2012-10-20 DIAGNOSIS — I4891 Unspecified atrial fibrillation: Secondary | ICD-10-CM

## 2012-10-20 LAB — PROTIME-INR: INR: 3 — AB (ref 0.9–1.1)

## 2012-10-29 ENCOUNTER — Encounter: Payer: Self-pay | Admitting: Internal Medicine

## 2012-11-19 ENCOUNTER — Other Ambulatory Visit (HOSPITAL_COMMUNITY): Payer: Self-pay | Admitting: Internal Medicine

## 2012-11-19 ENCOUNTER — Ambulatory Visit (HOSPITAL_COMMUNITY)
Admission: RE | Admit: 2012-11-19 | Discharge: 2012-11-19 | Disposition: A | Discharge status: Home / Self Care | Payer: Medicare Other | Source: Ambulatory Visit | Attending: Internal Medicine | Admitting: Internal Medicine

## 2012-11-19 DIAGNOSIS — R609 Edema, unspecified: Secondary | ICD-10-CM

## 2012-11-19 DIAGNOSIS — M7989 Other specified soft tissue disorders: Secondary | ICD-10-CM | POA: Insufficient documentation

## 2012-11-19 DIAGNOSIS — R269 Unspecified abnormalities of gait and mobility: Secondary | ICD-10-CM | POA: Diagnosis not present

## 2012-11-19 DIAGNOSIS — R279 Unspecified lack of coordination: Secondary | ICD-10-CM | POA: Diagnosis not present

## 2012-11-19 DIAGNOSIS — M79609 Pain in unspecified limb: Secondary | ICD-10-CM | POA: Diagnosis not present

## 2012-11-19 DIAGNOSIS — M6281 Muscle weakness (generalized): Secondary | ICD-10-CM | POA: Diagnosis not present

## 2012-11-19 NOTE — Progress Notes (Signed)
VASCULAR LAB PRELIMINARY  PRELIMINARY  PRELIMINARY  PRELIMINARY  Left lower extremity venous duplex completed.    Preliminary report:  Left:  No evidence of DVT, superficial thrombosis, or Baker's cyst.  Cesily Cuoco, RVS 11/19/2012, 6:57 PM

## 2012-11-21 DIAGNOSIS — R279 Unspecified lack of coordination: Secondary | ICD-10-CM | POA: Diagnosis not present

## 2012-11-21 DIAGNOSIS — M6281 Muscle weakness (generalized): Secondary | ICD-10-CM | POA: Diagnosis not present

## 2012-11-21 DIAGNOSIS — R269 Unspecified abnormalities of gait and mobility: Secondary | ICD-10-CM | POA: Diagnosis not present

## 2012-11-24 ENCOUNTER — Ambulatory Visit (INDEPENDENT_AMBULATORY_CARE_PROVIDER_SITE_OTHER): Payer: Medicare Other | Admitting: Cardiovascular Disease

## 2012-11-24 DIAGNOSIS — I4891 Unspecified atrial fibrillation: Secondary | ICD-10-CM | POA: Diagnosis not present

## 2012-11-24 DIAGNOSIS — Z7901 Long term (current) use of anticoagulants: Secondary | ICD-10-CM | POA: Diagnosis not present

## 2012-11-24 LAB — PROTIME-INR: INR: 3.4 — AB (ref 0.9–1.1)

## 2012-12-01 DIAGNOSIS — R279 Unspecified lack of coordination: Secondary | ICD-10-CM | POA: Diagnosis not present

## 2012-12-01 DIAGNOSIS — M6281 Muscle weakness (generalized): Secondary | ICD-10-CM | POA: Diagnosis not present

## 2012-12-01 DIAGNOSIS — R269 Unspecified abnormalities of gait and mobility: Secondary | ICD-10-CM | POA: Diagnosis not present

## 2012-12-15 ENCOUNTER — Ambulatory Visit (INDEPENDENT_AMBULATORY_CARE_PROVIDER_SITE_OTHER): Payer: Medicare Other | Admitting: Cardiology

## 2012-12-15 DIAGNOSIS — Z7901 Long term (current) use of anticoagulants: Secondary | ICD-10-CM

## 2012-12-15 DIAGNOSIS — I4891 Unspecified atrial fibrillation: Secondary | ICD-10-CM

## 2012-12-15 LAB — PROTIME-INR: INR: 3.4 — AB (ref 0.9–1.1)

## 2012-12-29 ENCOUNTER — Ambulatory Visit (INDEPENDENT_AMBULATORY_CARE_PROVIDER_SITE_OTHER): Payer: Medicare Other | Admitting: Cardiology

## 2012-12-29 ENCOUNTER — Encounter: Payer: Medicare Other | Admitting: *Deleted

## 2012-12-29 DIAGNOSIS — I4891 Unspecified atrial fibrillation: Secondary | ICD-10-CM | POA: Diagnosis not present

## 2012-12-29 DIAGNOSIS — Z7901 Long term (current) use of anticoagulants: Secondary | ICD-10-CM

## 2012-12-29 LAB — PROTIME-INR: INR: 2.1 — AB (ref 0.9–1.1)

## 2013-01-08 ENCOUNTER — Encounter: Payer: Self-pay | Admitting: *Deleted

## 2013-01-14 ENCOUNTER — Ambulatory Visit (INDEPENDENT_AMBULATORY_CARE_PROVIDER_SITE_OTHER): Payer: Medicare Other | Admitting: *Deleted

## 2013-01-14 DIAGNOSIS — I442 Atrioventricular block, complete: Secondary | ICD-10-CM | POA: Diagnosis not present

## 2013-01-19 ENCOUNTER — Ambulatory Visit (INDEPENDENT_AMBULATORY_CARE_PROVIDER_SITE_OTHER): Payer: Medicare Other | Admitting: Cardiology

## 2013-01-19 DIAGNOSIS — Z7901 Long term (current) use of anticoagulants: Secondary | ICD-10-CM

## 2013-01-19 DIAGNOSIS — I4891 Unspecified atrial fibrillation: Secondary | ICD-10-CM

## 2013-01-19 LAB — PROTIME-INR: INR: 2.5 — AB (ref 0.9–1.1)

## 2013-01-22 DIAGNOSIS — J40 Bronchitis, not specified as acute or chronic: Secondary | ICD-10-CM | POA: Diagnosis not present

## 2013-01-22 DIAGNOSIS — J309 Allergic rhinitis, unspecified: Secondary | ICD-10-CM | POA: Diagnosis not present

## 2013-01-22 DIAGNOSIS — J209 Acute bronchitis, unspecified: Secondary | ICD-10-CM | POA: Diagnosis not present

## 2013-01-22 LAB — REMOTE PACEMAKER DEVICE
AL IMPEDENCE PM: 604 Ohm
AL THRESHOLD: 1.75 V
ATRIAL PACING PM: 17
BAMS-0001: 150 {beats}/min
BATTERY VOLTAGE: 2.8 V
RV LEAD IMPEDENCE PM: 861 Ohm
RV LEAD THRESHOLD: 1.375 V
VENTRICULAR PACING PM: 100

## 2013-02-02 ENCOUNTER — Ambulatory Visit (INDEPENDENT_AMBULATORY_CARE_PROVIDER_SITE_OTHER): Payer: Medicare Other | Admitting: Cardiovascular Disease

## 2013-02-02 DIAGNOSIS — Z7901 Long term (current) use of anticoagulants: Secondary | ICD-10-CM

## 2013-02-02 DIAGNOSIS — I4891 Unspecified atrial fibrillation: Secondary | ICD-10-CM | POA: Diagnosis not present

## 2013-02-02 LAB — PROTIME-INR: INR: 3.4 — AB (ref <=1.1)

## 2013-02-04 ENCOUNTER — Encounter: Payer: Self-pay | Admitting: *Deleted

## 2013-02-05 DIAGNOSIS — Z1331 Encounter for screening for depression: Secondary | ICD-10-CM | POA: Diagnosis not present

## 2013-02-05 DIAGNOSIS — Z0000 Encounter for general adult medical examination without abnormal findings: Secondary | ICD-10-CM | POA: Diagnosis not present

## 2013-02-05 DIAGNOSIS — M810 Age-related osteoporosis without current pathological fracture: Secondary | ICD-10-CM | POA: Diagnosis not present

## 2013-02-05 DIAGNOSIS — E039 Hypothyroidism, unspecified: Secondary | ICD-10-CM | POA: Diagnosis not present

## 2013-02-05 DIAGNOSIS — E785 Hyperlipidemia, unspecified: Secondary | ICD-10-CM | POA: Diagnosis not present

## 2013-02-12 DIAGNOSIS — E785 Hyperlipidemia, unspecified: Secondary | ICD-10-CM | POA: Diagnosis not present

## 2013-02-12 DIAGNOSIS — M810 Age-related osteoporosis without current pathological fracture: Secondary | ICD-10-CM | POA: Diagnosis not present

## 2013-02-12 DIAGNOSIS — E039 Hypothyroidism, unspecified: Secondary | ICD-10-CM | POA: Diagnosis not present

## 2013-02-12 DIAGNOSIS — G609 Hereditary and idiopathic neuropathy, unspecified: Secondary | ICD-10-CM | POA: Diagnosis not present

## 2013-02-13 ENCOUNTER — Encounter: Payer: Self-pay | Admitting: Internal Medicine

## 2013-02-14 DIAGNOSIS — Z23 Encounter for immunization: Secondary | ICD-10-CM | POA: Diagnosis not present

## 2013-02-16 ENCOUNTER — Ambulatory Visit (INDEPENDENT_AMBULATORY_CARE_PROVIDER_SITE_OTHER): Payer: Medicare Other | Admitting: Pharmacist

## 2013-02-16 DIAGNOSIS — Z7901 Long term (current) use of anticoagulants: Secondary | ICD-10-CM | POA: Diagnosis not present

## 2013-02-16 DIAGNOSIS — I4891 Unspecified atrial fibrillation: Secondary | ICD-10-CM | POA: Diagnosis not present

## 2013-02-16 LAB — PROTIME-INR: INR: 2.7 — AB (ref 0.9–1.1)

## 2013-02-17 DIAGNOSIS — D485 Neoplasm of uncertain behavior of skin: Secondary | ICD-10-CM | POA: Diagnosis not present

## 2013-02-17 DIAGNOSIS — L219 Seborrheic dermatitis, unspecified: Secondary | ICD-10-CM | POA: Diagnosis not present

## 2013-03-09 ENCOUNTER — Ambulatory Visit (INDEPENDENT_AMBULATORY_CARE_PROVIDER_SITE_OTHER): Payer: Medicare Other | Admitting: Pharmacist

## 2013-03-09 DIAGNOSIS — I4891 Unspecified atrial fibrillation: Secondary | ICD-10-CM

## 2013-03-09 DIAGNOSIS — Z7901 Long term (current) use of anticoagulants: Secondary | ICD-10-CM

## 2013-03-09 LAB — PROTIME-INR: INR: 3 — AB (ref 0.9–1.1)

## 2013-03-10 DIAGNOSIS — R109 Unspecified abdominal pain: Secondary | ICD-10-CM | POA: Diagnosis not present

## 2013-03-10 DIAGNOSIS — M545 Low back pain, unspecified: Secondary | ICD-10-CM | POA: Diagnosis not present

## 2013-03-11 ENCOUNTER — Other Ambulatory Visit: Payer: Self-pay | Admitting: Specialist

## 2013-03-11 DIAGNOSIS — R292 Abnormal reflex: Secondary | ICD-10-CM

## 2013-03-11 DIAGNOSIS — M545 Low back pain, unspecified: Secondary | ICD-10-CM | POA: Diagnosis not present

## 2013-03-11 DIAGNOSIS — R2689 Other abnormalities of gait and mobility: Secondary | ICD-10-CM

## 2013-03-16 ENCOUNTER — Ambulatory Visit
Admission: RE | Admit: 2013-03-16 | Discharge: 2013-03-16 | Disposition: A | Discharge status: Home / Self Care | Payer: Medicare Other | Source: Ambulatory Visit | Attending: Specialist | Admitting: Specialist

## 2013-03-16 DIAGNOSIS — R292 Abnormal reflex: Secondary | ICD-10-CM

## 2013-03-16 DIAGNOSIS — R2689 Other abnormalities of gait and mobility: Secondary | ICD-10-CM

## 2013-03-16 DIAGNOSIS — G9389 Other specified disorders of brain: Secondary | ICD-10-CM | POA: Diagnosis not present

## 2013-03-20 ENCOUNTER — Ambulatory Visit (INDEPENDENT_AMBULATORY_CARE_PROVIDER_SITE_OTHER): Payer: Medicare Other | Admitting: Internal Medicine

## 2013-03-20 ENCOUNTER — Encounter: Payer: Self-pay | Admitting: Internal Medicine

## 2013-03-20 VITALS — BP 128/69 | HR 91 | Ht 62.0 in | Wt 147.0 lb

## 2013-03-20 DIAGNOSIS — Z950 Presence of cardiac pacemaker: Secondary | ICD-10-CM | POA: Diagnosis not present

## 2013-03-20 DIAGNOSIS — Z7901 Long term (current) use of anticoagulants: Secondary | ICD-10-CM | POA: Diagnosis not present

## 2013-03-20 DIAGNOSIS — I4891 Unspecified atrial fibrillation: Secondary | ICD-10-CM | POA: Diagnosis not present

## 2013-03-20 DIAGNOSIS — I442 Atrioventricular block, complete: Secondary | ICD-10-CM

## 2013-03-20 LAB — MDC_IDC_ENUM_SESS_TYPE_INCLINIC
Battery Impedance: 154 Ohm
Battery Remaining Longevity: 10
Battery Voltage: 2.8 V
Brady Statistic RA Percent Paced: 15.9 %
Brady Statistic RV Percent Paced: 100 %
Lead Channel Impedance Value: 604 Ohm
Lead Channel Impedance Value: 880 Ohm
Lead Channel Pacing Threshold Amplitude: 1 V
Lead Channel Pacing Threshold Amplitude: 1.25 V
Lead Channel Pacing Threshold Pulse Width: 1 ms
Lead Channel Pacing Threshold Pulse Width: 1 ms
Lead Channel Sensing Intrinsic Amplitude: 1 mV
Lead Channel Sensing Intrinsic Amplitude: 11.2 mV
Lead Channel Setting Pacing Amplitude: 2.75 V
Lead Channel Setting Pacing Amplitude: 3.5 V
Lead Channel Setting Pacing Pulse Width: 0.4 ms
Lead Channel Setting Sensing Sensitivity: 2.8 mV

## 2013-03-20 NOTE — Patient Instructions (Addendum)
Remote monitoring is used to monitor your pacemaker from home. This monitoring reduces the number of office visits required to check your device to one time per year. It allows [place] to keep an eye on the functioning of your device to ensure it is working properly. You are scheduled for a device check from home on 06-22-2013. You may send your transmission at any time that day. If you have a wireless device, the transmission will be sent automatically. After your physician reviews your transmission, you will receive a postcard with your next transmission date.  Your physician recommends that you schedule a follow-up appointment in: 1 year with Norma Fredrickson, NP and with Dr.Allred in 2 years

## 2013-03-20 NOTE — Progress Notes (Signed)
PCP: Thayer Headings, MD  [name] is a 79 y.o. female who presents today for routine electrophysiology followup.  Since last being seen in our clinic, the patient reports doing reasonably well.   Today, she denies symptoms of palpitations, exertional chest pain, shortness of breath,  lower extremity edema, dizziness, presyncope, or syncope.  The patient is otherwise without complaint today.   Past Medical History  Diagnosis Date  . Arthritis   . Depression   . Hyperlipidemia   . Hypothyroidism   . HB (heart block)     s/p PPM, most recent generator change 12/11 by JA  . Dementia   . Hiatal hernia   . PTE (post-transplant erythrocytosis)   . Atrial fibrillation   . Pulmonary embolism 2011    on coumadin  . DVT (deep venous thrombosis) 2011  . UTI (lower urinary tract infection)    Past Surgical History  Procedure Laterality Date  . Hemorrhoid surgery    . Pacemaker      most recent generator 12/11 by JA  . Hysterectomy (other)    . Cholecystectomy  09/2010    Current Outpatient Prescriptions  Medication Sig Dispense Refill  . alendronate (FOSAMAX) 35 MG tablet Take 35 mg by mouth every 7 (seven) days. Take with a full glass of water on an empty stomach.      . Calcium Carbonate (CALCIUM-CARB 600 PO) Take by mouth daily.        . cholecalciferol (VITAMIN D) 1000 UNITS tablet Take 1,000 Units by mouth as directed.        . citalopram (CELEXA) 20 MG tablet Take 10 mg by mouth daily.      [name] donepezil (ARICEPT) 10 MG tablet Take 5 mg by mouth daily.       [name] HYDROCODONE-CHLORPHENIRAMINE PO Take by mouth as needed.      [name] HYDROcodone-homatropine (HYCODAN) 5-1.5 MG/5ML syrup Take 2.5 mLs by mouth as needed.      . levETIRAcetam (KEPPRA) 250 MG tablet Take 125 mg by mouth as needed.       [name] levothyroxine (SYNTHROID, LEVOTHROID) 50 MCG tablet Take 50 mcg by mouth daily.        [name] loratadine (CLARITIN) 10 MG tablet Take 10 mg by mouth as needed.      . memantine (NAMENDA) 10 MG  tablet Take 5 mg by mouth 2 (two) times daily.       . Multiple Vitamin (MULTIVITAMIN) capsule Take 1 capsule by mouth daily.        [name] NITROSTAT 0.4 MG SL tablet Place 0.4 mg under the tongue every 5 (five) minutes as needed.       [name] omeprazole (PRILOSEC) 20 MG capsule Take 20 mg by mouth 2 (two) times daily.       . pravastatin (PRAVACHOL) 20 MG tablet Take 20 mg by mouth daily.        . salsalate (DISALCID) 750 MG tablet Take 750 mg by mouth 2 (two) times daily.        Bernadette Hoit Sodium (SENNA-S PO) Take 1 tablet by mouth 2 (two) times daily.       [name] warfarin (COUMADIN) 5 MG tablet Take as directed by coumadin clinic  90 tablet  1   No current facility-administered medications for this visit.    Physical Exam: Filed Vitals:   03/20/13 1353  BP: 128/69  Pulse: 91  Height: 5\' 2"  (1.575 m)  Weight: 147 lb (66.679 kg)    GEN- The  patient is well appearing, alert and oriented x 3 today.   Head- normocephalic, atraumatic Eyes-  Sclera clear, conjunctiva pink Ears- hearing intact Oropharynx- clear Lungs- Clear to ausculation bilaterally, normal work of breathing Chest- pacemaker pocket is well healed Heart- Regular rate and rhythm, no murmurs, rubs or gallops, PMI not laterally displaced GI- soft, NT, ND, + BS Extremities- no clubbing, cyanosis, or edema  Pacemaker interrogation- reviewed in detail today,  See PACEART report ekg today reveals AV sequential pacing  Assessment and Plan:  1. Complete heart block Normal pacemaker function See Pace Art report No changes today  2. Afib/ h/o PTE Continue long term anticoagulation with coumadin  [facility] She would like to follow with Norma Fredrickson (whom she has seen in the past) but does not want to be seen in our office more than once per year of possible.  I will therefore schedule follow-up with Lawson Fiscal in 1 year and I will see in 2 years unless needed earlier.

## 2013-03-27 ENCOUNTER — Telehealth: Payer: Self-pay | Admitting: *Deleted

## 2013-03-27 DIAGNOSIS — J0100 Acute maxillary sinusitis, unspecified: Secondary | ICD-10-CM | POA: Diagnosis not present

## 2013-03-27 NOTE — Telephone Encounter (Signed)
Spouse called to inform us that pt is starting Amoxicillin 875mg s BID for 10 days and Also Diflucan QOD for 3 tabs. Thus, instructed spouse to have pt INR checked on Monday. Called Nurse, Aggie Cosier at [facility] and gave her VO to check INR on Monday due to med changes.

## 2013-03-30 ENCOUNTER — Ambulatory Visit (INDEPENDENT_AMBULATORY_CARE_PROVIDER_SITE_OTHER): Payer: Medicare Other | Admitting: Pharmacist

## 2013-03-30 DIAGNOSIS — Z7901 Long term (current) use of anticoagulants: Secondary | ICD-10-CM | POA: Diagnosis not present

## 2013-03-30 DIAGNOSIS — I4891 Unspecified atrial fibrillation: Secondary | ICD-10-CM | POA: Diagnosis not present

## 2013-03-30 LAB — PROTIME-INR: INR: 3.6 — AB (ref 0.9–1.1)

## 2013-04-01 DIAGNOSIS — M545 Low back pain, unspecified: Secondary | ICD-10-CM | POA: Diagnosis not present

## 2013-04-01 DIAGNOSIS — M546 Pain in thoracic spine: Secondary | ICD-10-CM | POA: Diagnosis not present

## 2013-04-06 ENCOUNTER — Ambulatory Visit (INDEPENDENT_AMBULATORY_CARE_PROVIDER_SITE_OTHER): Payer: Medicare Other | Admitting: Pharmacist

## 2013-04-06 DIAGNOSIS — I4891 Unspecified atrial fibrillation: Secondary | ICD-10-CM | POA: Diagnosis not present

## 2013-04-06 DIAGNOSIS — Z7901 Long term (current) use of anticoagulants: Secondary | ICD-10-CM

## 2013-04-06 LAB — PROTIME-INR: INR: 5.2 — AB (ref 0.9–1.1)

## 2013-04-06 MED ORDER — WARFARIN SODIUM 5 MG PO TABS: ORAL_TABLET | ORAL | Status: DC

## 2013-04-13 ENCOUNTER — Ambulatory Visit (INDEPENDENT_AMBULATORY_CARE_PROVIDER_SITE_OTHER): Payer: Medicare Other | Admitting: Pharmacist

## 2013-04-13 DIAGNOSIS — Z7901 Long term (current) use of anticoagulants: Secondary | ICD-10-CM

## 2013-04-13 DIAGNOSIS — I4891 Unspecified atrial fibrillation: Secondary | ICD-10-CM | POA: Diagnosis not present

## 2013-04-13 LAB — PROTIME-INR: INR: 2.3 — AB (ref 0.9–1.1)

## 2013-04-28 DIAGNOSIS — F411 Generalized anxiety disorder: Secondary | ICD-10-CM | POA: Diagnosis not present

## 2013-04-28 DIAGNOSIS — G3189 Other specified degenerative diseases of nervous system: Secondary | ICD-10-CM | POA: Diagnosis not present

## 2013-05-04 ENCOUNTER — Ambulatory Visit (INDEPENDENT_AMBULATORY_CARE_PROVIDER_SITE_OTHER): Payer: Medicare Other | Admitting: Pharmacist

## 2013-05-04 DIAGNOSIS — Z7901 Long term (current) use of anticoagulants: Secondary | ICD-10-CM | POA: Diagnosis not present

## 2013-05-04 DIAGNOSIS — I4891 Unspecified atrial fibrillation: Secondary | ICD-10-CM | POA: Diagnosis not present

## 2013-05-04 LAB — PROTIME-INR: INR: 2.4 — AB (ref 0.9–1.1)

## 2013-06-01 ENCOUNTER — Ambulatory Visit (INDEPENDENT_AMBULATORY_CARE_PROVIDER_SITE_OTHER): Payer: Medicare Other | Admitting: Pharmacist

## 2013-06-01 DIAGNOSIS — Z7901 Long term (current) use of anticoagulants: Secondary | ICD-10-CM

## 2013-06-01 DIAGNOSIS — I4891 Unspecified atrial fibrillation: Secondary | ICD-10-CM | POA: Diagnosis not present

## 2013-06-01 LAB — PROTIME-INR: INR: 2.6 — AB (ref 0.9–1.1)

## 2013-06-04 DIAGNOSIS — M1710 Unilateral primary osteoarthritis, unspecified knee: Secondary | ICD-10-CM | POA: Diagnosis not present

## 2013-06-22 ENCOUNTER — Ambulatory Visit (INDEPENDENT_AMBULATORY_CARE_PROVIDER_SITE_OTHER): Payer: Medicare Other | Admitting: *Deleted

## 2013-06-22 DIAGNOSIS — I4891 Unspecified atrial fibrillation: Secondary | ICD-10-CM | POA: Diagnosis not present

## 2013-06-22 DIAGNOSIS — I442 Atrioventricular block, complete: Secondary | ICD-10-CM

## 2013-06-29 ENCOUNTER — Ambulatory Visit (INDEPENDENT_AMBULATORY_CARE_PROVIDER_SITE_OTHER): Payer: Medicare Other | Admitting: Interventional Cardiology

## 2013-06-29 DIAGNOSIS — I4891 Unspecified atrial fibrillation: Secondary | ICD-10-CM | POA: Diagnosis not present

## 2013-06-29 DIAGNOSIS — Z7901 Long term (current) use of anticoagulants: Secondary | ICD-10-CM

## 2013-06-29 LAB — PROTIME-INR: INR: 3.5 — AB (ref 0.9–1.1)

## 2013-07-01 LAB — MDC_IDC_ENUM_SESS_TYPE_REMOTE
Battery Voltage: 2.8 V
Brady Statistic AP VP Percent: 10.7 %
Brady Statistic AP VS Percent: 0.1 % — CL
Brady Statistic AS VP Percent: 89.3 %
Brady Statistic AS VS Percent: 0.1 % — CL
Lead Channel Impedance Value: 613 Ohm
Lead Channel Impedance Value: 905 Ohm
Lead Channel Pacing Threshold Amplitude: 1.5 V
Lead Channel Pacing Threshold Amplitude: 1.875 V
Lead Channel Pacing Threshold Pulse Width: 0.4 ms
Lead Channel Pacing Threshold Pulse Width: 0.4 ms
Lead Channel Setting Pacing Amplitude: 2.75 V
Lead Channel Setting Pacing Amplitude: 3.5 V
Lead Channel Setting Pacing Pulse Width: 0.4 ms
Lead Channel Setting Sensing Sensitivity: 2.8 mV

## 2013-07-13 ENCOUNTER — Ambulatory Visit (INDEPENDENT_AMBULATORY_CARE_PROVIDER_SITE_OTHER): Payer: Medicare Other | Admitting: Pharmacist

## 2013-07-13 ENCOUNTER — Encounter: Payer: Self-pay | Admitting: *Deleted

## 2013-07-13 DIAGNOSIS — Z7901 Long term (current) use of anticoagulants: Secondary | ICD-10-CM

## 2013-07-13 DIAGNOSIS — I4891 Unspecified atrial fibrillation: Secondary | ICD-10-CM | POA: Diagnosis not present

## 2013-07-13 LAB — PROTIME-INR: INR: 2.4 — AB (ref 0.9–1.1)

## 2013-07-20 ENCOUNTER — Encounter: Payer: Self-pay | Admitting: Internal Medicine

## 2013-07-27 ENCOUNTER — Ambulatory Visit (INDEPENDENT_AMBULATORY_CARE_PROVIDER_SITE_OTHER): Payer: Medicare Other | Admitting: Pharmacist

## 2013-07-27 DIAGNOSIS — Z7901 Long term (current) use of anticoagulants: Secondary | ICD-10-CM | POA: Diagnosis not present

## 2013-07-27 DIAGNOSIS — I4891 Unspecified atrial fibrillation: Secondary | ICD-10-CM

## 2013-07-27 LAB — PROTIME-INR: INR: 3.1 — AB (ref 0.9–1.1)

## 2013-08-04 DIAGNOSIS — M25519 Pain in unspecified shoulder: Secondary | ICD-10-CM | POA: Diagnosis not present

## 2013-08-04 DIAGNOSIS — M19019 Primary osteoarthritis, unspecified shoulder: Secondary | ICD-10-CM | POA: Diagnosis not present

## 2013-08-06 DIAGNOSIS — E785 Hyperlipidemia, unspecified: Secondary | ICD-10-CM | POA: Diagnosis not present

## 2013-08-06 DIAGNOSIS — M810 Age-related osteoporosis without current pathological fracture: Secondary | ICD-10-CM | POA: Diagnosis not present

## 2013-08-06 DIAGNOSIS — E039 Hypothyroidism, unspecified: Secondary | ICD-10-CM | POA: Diagnosis not present

## 2013-08-10 ENCOUNTER — Ambulatory Visit (INDEPENDENT_AMBULATORY_CARE_PROVIDER_SITE_OTHER): Payer: Medicare Other | Admitting: Pharmacist

## 2013-08-10 DIAGNOSIS — I4891 Unspecified atrial fibrillation: Secondary | ICD-10-CM | POA: Diagnosis not present

## 2013-08-10 DIAGNOSIS — Z7901 Long term (current) use of anticoagulants: Secondary | ICD-10-CM

## 2013-08-10 LAB — POCT INR: INR: 2

## 2013-08-13 DIAGNOSIS — F411 Generalized anxiety disorder: Secondary | ICD-10-CM | POA: Diagnosis not present

## 2013-08-13 DIAGNOSIS — M810 Age-related osteoporosis without current pathological fracture: Secondary | ICD-10-CM | POA: Diagnosis not present

## 2013-08-13 DIAGNOSIS — E785 Hyperlipidemia, unspecified: Secondary | ICD-10-CM | POA: Diagnosis not present

## 2013-08-13 DIAGNOSIS — E039 Hypothyroidism, unspecified: Secondary | ICD-10-CM | POA: Diagnosis not present

## 2013-08-24 ENCOUNTER — Ambulatory Visit (INDEPENDENT_AMBULATORY_CARE_PROVIDER_SITE_OTHER): Payer: Medicare Other | Admitting: Cardiology

## 2013-08-24 DIAGNOSIS — I4891 Unspecified atrial fibrillation: Secondary | ICD-10-CM

## 2013-08-24 DIAGNOSIS — Z7901 Long term (current) use of anticoagulants: Secondary | ICD-10-CM

## 2013-08-24 LAB — PROTIME-INR: INR: 2.8 — AB (ref 0.9–1.1)

## 2013-09-14 ENCOUNTER — Ambulatory Visit (INDEPENDENT_AMBULATORY_CARE_PROVIDER_SITE_OTHER): Payer: Medicare Other | Admitting: Cardiovascular Disease

## 2013-09-14 DIAGNOSIS — Z7901 Long term (current) use of anticoagulants: Secondary | ICD-10-CM

## 2013-09-14 DIAGNOSIS — I4891 Unspecified atrial fibrillation: Secondary | ICD-10-CM | POA: Diagnosis not present

## 2013-09-14 LAB — PROTIME-INR: INR: 2.4 — AB (ref 0.9–1.1)

## 2013-09-22 ENCOUNTER — Telehealth: Payer: Self-pay | Admitting: Internal Medicine

## 2013-09-22 NOTE — Telephone Encounter (Signed)
Pt scheduled for 1130 on 09-23-13 for device check

## 2013-09-22 NOTE — Telephone Encounter (Signed)
New message     Want to come in and have pacemaker checked.  Pt is tired and "worn out". Husband said she cannot wait until her remote check on thurs.  Please call

## 2013-09-23 ENCOUNTER — Ambulatory Visit (INDEPENDENT_AMBULATORY_CARE_PROVIDER_SITE_OTHER): Payer: Medicare Other | Admitting: *Deleted

## 2013-09-23 DIAGNOSIS — I442 Atrioventricular block, complete: Secondary | ICD-10-CM

## 2013-09-23 DIAGNOSIS — I4891 Unspecified atrial fibrillation: Secondary | ICD-10-CM | POA: Diagnosis not present

## 2013-09-23 LAB — MDC_IDC_ENUM_SESS_TYPE_INCLINIC
Battery Impedance: 201 Ohm
Battery Remaining Longevity: 115 mo
Battery Voltage: 2.79 V
Brady Statistic AP VP Percent: 12 %
Brady Statistic AP VS Percent: 0 %
Brady Statistic AS VP Percent: 88 %
Brady Statistic AS VS Percent: 0 %
Date Time Interrogation Session: 20150513121423
Lead Channel Impedance Value: 654 Ohm
Lead Channel Impedance Value: 933 Ohm
Lead Channel Pacing Threshold Amplitude: 1 V
Lead Channel Pacing Threshold Amplitude: 1.25 V
Lead Channel Pacing Threshold Pulse Width: 0.4 ms
Lead Channel Pacing Threshold Pulse Width: 0.4 ms
Lead Channel Sensing Intrinsic Amplitude: 1 mV
Lead Channel Setting Pacing Amplitude: 2 V
Lead Channel Setting Pacing Amplitude: 2.5 V
Lead Channel Setting Pacing Pulse Width: 0.4 ms
Lead Channel Setting Sensing Sensitivity: 2.8 mV

## 2013-09-23 NOTE — Progress Notes (Signed)
Pacemaker check in clinic. Normal device function. Thresholds, sensing, impedances consistent with previous measurements. Device programmed to maximize longevity. 342 mode switches--longest was 37 seconds. 1 high ventricular rate noted lasting 9 beats. Device programmed at appropriate safety margins. Histogram distribution appropriate for patient activity level. Device programmed to optimize intrinsic conduction. Estimated longevity 9.5 years. Patient enrolled in remote follow-up. [facility] 12-24-13 and ROV in February with Cecille Rubin.

## 2013-09-24 DIAGNOSIS — E785 Hyperlipidemia, unspecified: Secondary | ICD-10-CM | POA: Diagnosis not present

## 2013-09-24 DIAGNOSIS — R5383 Other fatigue: Secondary | ICD-10-CM | POA: Diagnosis not present

## 2013-09-24 DIAGNOSIS — N390 Urinary tract infection, site not specified: Secondary | ICD-10-CM | POA: Diagnosis not present

## 2013-09-24 DIAGNOSIS — E039 Hypothyroidism, unspecified: Secondary | ICD-10-CM | POA: Diagnosis not present

## 2013-09-24 DIAGNOSIS — R5381 Other malaise: Secondary | ICD-10-CM | POA: Diagnosis not present

## 2013-09-28 ENCOUNTER — Telehealth: Payer: Self-pay | Admitting: Surgery

## 2013-09-28 ENCOUNTER — Encounter: Payer: Self-pay | Admitting: Cardiology

## 2013-09-28 NOTE — Progress Notes (Signed)
This encounter was created in error - please disregard.

## 2013-09-28 NOTE — Telephone Encounter (Signed)
Spoke with Mr allaina brotzman had questions regarding new antibiotics (Bactrium and Monistat cream) that his wife started on Friday, 09/25/13, due to a UTI.  I spoke with Ysidro Evert, and Mrs. Dominik needs to have her INR checked tomorrow.  Called Mr. Alwin back and appointment scheduled for tomorrow.

## 2013-09-29 ENCOUNTER — Ambulatory Visit (INDEPENDENT_AMBULATORY_CARE_PROVIDER_SITE_OTHER): Payer: Medicare Other | Admitting: *Deleted

## 2013-09-29 DIAGNOSIS — L821 Other seborrheic keratosis: Secondary | ICD-10-CM | POA: Diagnosis not present

## 2013-09-29 DIAGNOSIS — I4891 Unspecified atrial fibrillation: Secondary | ICD-10-CM

## 2013-09-29 DIAGNOSIS — Z7901 Long term (current) use of anticoagulants: Secondary | ICD-10-CM

## 2013-09-29 DIAGNOSIS — D235 Other benign neoplasm of skin of trunk: Secondary | ICD-10-CM | POA: Diagnosis not present

## 2013-09-29 DIAGNOSIS — D1801 Hemangioma of skin and subcutaneous tissue: Secondary | ICD-10-CM | POA: Diagnosis not present

## 2013-09-29 LAB — POCT INR: INR: 3.2

## 2013-10-02 ENCOUNTER — Encounter: Payer: Self-pay | Admitting: Internal Medicine

## 2013-10-06 ENCOUNTER — Ambulatory Visit (INDEPENDENT_AMBULATORY_CARE_PROVIDER_SITE_OTHER): Payer: Medicare Other | Admitting: *Deleted

## 2013-10-06 DIAGNOSIS — Z7901 Long term (current) use of anticoagulants: Secondary | ICD-10-CM

## 2013-10-06 DIAGNOSIS — I4891 Unspecified atrial fibrillation: Secondary | ICD-10-CM | POA: Diagnosis not present

## 2013-10-06 LAB — POCT INR: INR: 2.7

## 2013-10-09 ENCOUNTER — Telehealth: Payer: Self-pay | Admitting: *Deleted

## 2013-10-09 NOTE — Telephone Encounter (Signed)
[name] at [facility] called requesting that we move her recheck of INR from June 8th until June 11th and gave order that this would be fine

## 2013-10-22 ENCOUNTER — Ambulatory Visit (INDEPENDENT_AMBULATORY_CARE_PROVIDER_SITE_OTHER): Payer: Medicare Other | Admitting: Cardiology

## 2013-10-22 DIAGNOSIS — I4891 Unspecified atrial fibrillation: Secondary | ICD-10-CM | POA: Diagnosis not present

## 2013-10-22 DIAGNOSIS — Z5181 Encounter for therapeutic drug level monitoring: Secondary | ICD-10-CM | POA: Diagnosis not present

## 2013-10-22 DIAGNOSIS — Z7901 Long term (current) use of anticoagulants: Secondary | ICD-10-CM

## 2013-10-22 LAB — PROTIME-INR: INR: 2.7 — AB (ref 0.9–1.1)

## 2013-10-26 ENCOUNTER — Other Ambulatory Visit: Payer: Self-pay

## 2013-10-26 MED ORDER — WARFARIN SODIUM 5 MG PO TABS: ORAL_TABLET | ORAL | Status: DC

## 2013-10-27 DIAGNOSIS — F411 Generalized anxiety disorder: Secondary | ICD-10-CM | POA: Diagnosis not present

## 2013-10-27 DIAGNOSIS — G3189 Other specified degenerative diseases of nervous system: Secondary | ICD-10-CM | POA: Diagnosis not present

## 2013-11-01 ENCOUNTER — Emergency Department (HOSPITAL_COMMUNITY): Payer: Medicare Other

## 2013-11-01 ENCOUNTER — Emergency Department (HOSPITAL_COMMUNITY)
Admission: EM | Admit: 2013-11-01 | Discharge: 2013-11-01 | Disposition: A | Discharge status: Home / Self Care | Payer: Medicare Other | Attending: Emergency Medicine | Admitting: Emergency Medicine

## 2013-11-01 ENCOUNTER — Encounter (HOSPITAL_COMMUNITY): Payer: Self-pay | Admitting: Emergency Medicine

## 2013-11-01 DIAGNOSIS — R5381 Other malaise: Secondary | ICD-10-CM | POA: Insufficient documentation

## 2013-11-01 DIAGNOSIS — R61 Generalized hyperhidrosis: Secondary | ICD-10-CM | POA: Diagnosis not present

## 2013-11-01 DIAGNOSIS — F3289 Other specified depressive episodes: Secondary | ICD-10-CM | POA: Insufficient documentation

## 2013-11-01 DIAGNOSIS — I4891 Unspecified atrial fibrillation: Secondary | ICD-10-CM | POA: Insufficient documentation

## 2013-11-01 DIAGNOSIS — Z86711 Personal history of pulmonary embolism: Secondary | ICD-10-CM | POA: Insufficient documentation

## 2013-11-01 DIAGNOSIS — R42 Dizziness and giddiness: Secondary | ICD-10-CM | POA: Diagnosis not present

## 2013-11-01 DIAGNOSIS — Z950 Presence of cardiac pacemaker: Secondary | ICD-10-CM | POA: Insufficient documentation

## 2013-11-01 DIAGNOSIS — E039 Hypothyroidism, unspecified: Secondary | ICD-10-CM | POA: Insufficient documentation

## 2013-11-01 DIAGNOSIS — M129 Arthropathy, unspecified: Secondary | ICD-10-CM | POA: Diagnosis not present

## 2013-11-01 DIAGNOSIS — Z7901 Long term (current) use of anticoagulants: Secondary | ICD-10-CM | POA: Diagnosis not present

## 2013-11-01 DIAGNOSIS — R5383 Other fatigue: Secondary | ICD-10-CM | POA: Diagnosis not present

## 2013-11-01 DIAGNOSIS — Z791 Long term (current) use of non-steroidal anti-inflammatories (NSAID): Secondary | ICD-10-CM | POA: Insufficient documentation

## 2013-11-01 DIAGNOSIS — F329 Major depressive disorder, single episode, unspecified: Secondary | ICD-10-CM | POA: Diagnosis not present

## 2013-11-01 DIAGNOSIS — R55 Syncope and collapse: Secondary | ICD-10-CM

## 2013-11-01 DIAGNOSIS — F0390 Unspecified dementia without behavioral disturbance: Secondary | ICD-10-CM | POA: Insufficient documentation

## 2013-11-01 DIAGNOSIS — Z8719 Personal history of other diseases of the digestive system: Secondary | ICD-10-CM | POA: Insufficient documentation

## 2013-11-01 DIAGNOSIS — Z7902 Long term (current) use of antithrombotics/antiplatelets: Secondary | ICD-10-CM | POA: Diagnosis not present

## 2013-11-01 DIAGNOSIS — R404 Transient alteration of awareness: Secondary | ICD-10-CM | POA: Diagnosis not present

## 2013-11-01 DIAGNOSIS — Z86718 Personal history of other venous thrombosis and embolism: Secondary | ICD-10-CM | POA: Diagnosis not present

## 2013-11-01 DIAGNOSIS — R110 Nausea: Secondary | ICD-10-CM | POA: Insufficient documentation

## 2013-11-01 DIAGNOSIS — Z79899 Other long term (current) drug therapy: Secondary | ICD-10-CM | POA: Diagnosis not present

## 2013-11-01 DIAGNOSIS — E785 Hyperlipidemia, unspecified: Secondary | ICD-10-CM | POA: Diagnosis not present

## 2013-11-01 DIAGNOSIS — Z87440 Personal history of urinary (tract) infections: Secondary | ICD-10-CM | POA: Diagnosis not present

## 2013-11-01 LAB — CBC WITH DIFFERENTIAL/PLATELET
Basophils Absolute: 0 10*3/uL (ref 0.0–0.1)
Basophils Relative: 1 % (ref 0–1)
Eosinophils Absolute: 0 10*3/uL (ref 0.0–0.7)
Eosinophils Relative: 0 % (ref 0–5)
HCT: 40.5 % (ref 36.0–46.0)
Hemoglobin: 13.4 g/dL (ref 12.0–15.0)
Lymphocytes Relative: 28 % (ref 12–46)
Lymphs Abs: 1.8 10*3/uL (ref 0.7–4.0)
MCH: 30.4 pg (ref 26.0–34.0)
MCHC: 33.1 g/dL (ref 30.0–36.0)
MCV: 91.8 fL (ref 78.0–100.0)
Monocytes Absolute: 0.3 10*3/uL (ref 0.1–1.0)
Monocytes Relative: 5 % (ref 3–12)
Neutro Abs: 4.5 10*3/uL (ref 1.7–7.7)
Neutrophils Relative %: 66 % (ref 43–77)
Platelets: 200 10*3/uL (ref 150–400)
RBC: 4.41 MIL/uL (ref 3.87–5.11)
RDW: 14.4 % (ref 11.5–15.5)
WBC: 6.7 10*3/uL (ref 4.0–10.5)

## 2013-11-01 LAB — PROTIME-INR
INR: 1.77 — ABNORMAL HIGH (ref 0.00–1.49)
Prothrombin Time: 20.1 seconds — ABNORMAL HIGH (ref 11.6–15.2)

## 2013-11-01 LAB — COMPREHENSIVE METABOLIC PANEL
ALT: 10 U/L (ref 0–35)
AST: 17 U/L (ref 0–37)
Albumin: 3.6 g/dL (ref 3.5–5.2)
Alkaline Phosphatase: 64 U/L (ref 39–117)
BUN: 16 mg/dL (ref 6–23)
CO2: 22 mEq/L (ref 19–32)
Calcium: 9.6 mg/dL (ref 8.4–10.5)
Chloride: 104 mEq/L (ref 96–112)
Creatinine, Ser: 0.65 mg/dL (ref 0.50–1.10)
GFR calc Af Amer: 89 mL/min — ABNORMAL LOW (ref >90)
GFR calc non Af Amer: 77 mL/min — ABNORMAL LOW (ref >90)
Glucose, Bld: 100 mg/dL — ABNORMAL HIGH (ref 70–99)
Potassium: 4 mEq/L (ref 3.7–5.3)
Sodium: 142 mEq/L (ref 137–147)
Total Bilirubin: 0.4 mg/dL (ref 0.3–1.2)
Total Protein: 6.3 g/dL (ref 6.0–8.3)

## 2013-11-01 LAB — URINALYSIS, ROUTINE W REFLEX MICROSCOPIC
Bilirubin Urine: NEGATIVE
Glucose, UA: NEGATIVE mg/dL
Hgb urine dipstick: NEGATIVE
Ketones, ur: NEGATIVE mg/dL
Leukocytes, UA: NEGATIVE
Nitrite: NEGATIVE
Protein, ur: NEGATIVE mg/dL
Specific Gravity, Urine: 1.006 (ref 1.005–1.030)
Urobilinogen, UA: 0.2 mg/dL (ref 0.0–1.0)
pH: 8 (ref 5.0–8.0)

## 2013-11-01 LAB — I-STAT TROPONIN, ED
Troponin i, poc: 0.01 ng/mL (ref 0.00–0.08)
Troponin i, poc: 0 ng/mL (ref 0.00–0.08)
Troponin i, poc: 0.02 ng/mL (ref 0.00–0.08)

## 2013-11-01 NOTE — ED Provider Notes (Signed)
CSN: [id number]     Arrival date & time 11/01/13  7829 History   First MD Initiated Contact with Patient 11/01/13 [phone]     Chief Complaint  Patient presents with  . Near Syncope     (Consider location/radiation/quality/duration/timing/severity/associated sxs/prior Treatment) HPI Comments: The patient is an 80 year old female with history of hyperlipidemia, hypothyroidism, atrial fibrillation, pulmonary embolism, DVT presents today after a near syncopal episode. She is in an independent living facility and lives in an apartment with her husband. She states that this morning she woke up and began walking towards the dresser. She suddenly became very lightheaded and had to hold onto the dresser so she would not fall. She was diaphoretic and generally weak. She had associated nausea without vomiting. She was able to make it back to the bed and laid down. This episode resolved spontaneously within 30 minutes. The nurse at the independent living facility noted that her heart rate was 30 prior to EMS arrival. She is on Coumadin for atrial fibrillation. She is unsure of loss of her INRs checked. She reports that she was feeling well when she went to bed last night. No fever, chills, vomiting, chest pain, shortness of breath, diarrhea, dizziness.  The history is provided by the patient. No language interpreter was used.    Past Medical History  Diagnosis Date  . Arthritis   . Depression   . Hyperlipidemia   . Hypothyroidism   . HB (heart block)     s/p PPM, most recent generator change 12/11 by JA  . Dementia   . Hiatal hernia   . PTE (post-transplant erythrocytosis)   . Atrial fibrillation   . Pulmonary embolism 2011    on coumadin  . DVT (deep venous thrombosis) 2011  . UTI (lower urinary tract infection)    Past Surgical History  Procedure Laterality Date  . Hemorrhoid surgery    . Pacemaker      most recent generator 12/11 by JA  . Hysterectomy (other)    . Cholecystectomy  09/2010    Family History  Problem Relation Age of Onset  . Stomach cancer Brother   . Stomach cancer Brother   . Liver cancer Brother   . Stroke Father   . Heart disease Father   . Arthritis Father   . Breast cancer Sister   . Heart disease Mother   . Arthritis Mother    History  Substance Use Topics  . Smoking status: Never Smoker   . Smokeless tobacco: Not on file  . Alcohol Use: No   OB History   Grav Para Term Preterm Abortions TAB SAB Ect Mult Living                 Review of Systems  Constitutional: Positive for diaphoresis. Negative for fever and chills.  Respiratory: Negative for shortness of breath.   Cardiovascular: Negative for chest pain.  Gastrointestinal: Positive for nausea. Negative for vomiting.  Neurological: Positive for weakness (generalized) and light-headedness. Negative for numbness.  All other systems reviewed and are negative.     Allergies  Review of patient's allergies indicates no known allergies.  Home Medications   Prior to Admission medications   Medication Sig Start Date End Date Taking? Authorizing Provider  alendronate (FOSAMAX) 35 MG tablet Take 35 mg by mouth every 7 (seven) days. Take with a full glass of water on an empty stomach.    Historical Provider, MD  Calcium Carbonate (CALCIUM-CARB 600 PO) Take by mouth daily.  Historical Provider, MD  cholecalciferol (VITAMIN D) 1000 UNITS tablet Take 1,000 Units by mouth as directed.      Historical Provider, MD  citalopram (CELEXA) 20 MG tablet Take 10 mg by mouth daily.    Historical Provider, MD  donepezil (ARICEPT) 10 MG tablet Take 5 mg by mouth daily.     Historical Provider, MD  HYDROCODONE-CHLORPHENIRAMINE PO Take by mouth as needed.    Historical Provider, MD  HYDROcodone-homatropine (HYCODAN) 5-1.5 MG/5ML syrup Take 2.5 mLs by mouth as needed.    Historical Provider, MD  levETIRAcetam (KEPPRA) 250 MG tablet Take 125 mg by mouth as needed.  12/20/10   Historical Provider, MD   levothyroxine (SYNTHROID, LEVOTHROID) 50 MCG tablet Take 50 mcg by mouth daily.      Historical Provider, MD  loratadine (CLARITIN) 10 MG tablet Take 10 mg by mouth as needed.    Historical Provider, MD  memantine (NAMENDA) 10 MG tablet Take 5 mg by mouth 2 (two) times daily.     Historical Provider, MD  Multiple Vitamin (MULTIVITAMIN) capsule Take 1 capsule by mouth daily.      Historical Provider, MD  NITROSTAT 0.4 MG SL tablet Place 0.4 mg under the tongue every 5 (five) minutes as needed.  02/09/11   Historical Provider, MD  omeprazole (PRILOSEC) 20 MG capsule Take 20 mg by mouth 2 (two) times daily.     Historical Provider, MD  pravastatin (PRAVACHOL) 20 MG tablet Take 20 mg by mouth daily.      Historical Provider, MD  salsalate (DISALCID) 750 MG tablet Take 750 mg by mouth 2 (two) times daily.      Historical Provider, MD  Sennosides-Docusate Sodium (SENNA-S PO) Take 1 tablet by mouth 2 (two) times daily.     Historical Provider, MD  warfarin (COUMADIN) 5 MG tablet Take as directed by coumadin clinic 10/26/13   Thompson Grayer, MD   BP 109/40  Pulse 68  Temp(Src) 98 F (36.7 C) (Oral)  Resp 16  SpO2 94% Physical Exam  Nursing note and vitals reviewed. Constitutional: She is oriented to person, place, and time. She appears well-developed and well-nourished. No distress.  frail  HENT:  Head: Normocephalic and atraumatic.  Right Ear: External ear normal.  Left Ear: External ear normal.  Nose: Nose normal.  Mouth/Throat: Uvula is midline and oropharynx is clear and moist.  Eyes: Conjunctivae and EOM are normal. Pupils are equal, round, and reactive to light.  Neck: Normal range of motion.  Cardiovascular: Normal rate, regular rhythm, normal heart sounds, intact distal pulses and normal pulses.   Pulses:      Radial pulses are 2+ on the right side, and 2+ on the left side.       Posterior tibial pulses are 2+ on the right side, and 2+ on the left side.  Pulmonary/Chest: Effort normal  and breath sounds normal. No stridor. No respiratory distress. She has no wheezes. She has no rales.  Abdominal: Soft. She exhibits no distension. There is no tenderness.  Musculoskeletal: Normal range of motion.  Moves all extremities without guarding or ataxia.   Neurological: She is alert and oriented to person, place, and time. She has normal strength. GCS eye subscore is 4. GCS verbal subscore is 5. GCS motor subscore is 6.  No facial droop. Grip strength 5/5 bilaterally. Strength 5/5 in all extremities  Skin: Skin is warm and dry. She is not diaphoretic. No erythema.  Psychiatric: She has a normal mood and affect. Her  behavior is normal.    ED Course  Procedures (including critical care time) Labs Review Labs Reviewed  COMPREHENSIVE METABOLIC PANEL - Abnormal; Notable for the following:    Glucose, Bld 100 (*)    GFR calc non Af Amer 77 (*)    GFR calc Af Amer 89 (*)    All other components within normal limits  PROTIME-INR - Abnormal; Notable for the following:    Prothrombin Time 20.1 (*)    INR 1.77 (*)    All other components within normal limits  URINE CULTURE  CBC WITH DIFFERENTIAL  URINALYSIS, ROUTINE W REFLEX MICROSCOPIC  I-STAT TROPOININ, ED  I-STAT TROPOININ, ED  Randolm Idol, ED    Imaging Review Dg Chest 2 View  11/01/2013   CLINICAL DATA:  Dizziness, perspiration, nausea, near syncope this morning, history of atrial fibrillation  EXAM: CHEST  2 VIEW  COMPARISON:  01/22/2013  FINDINGS: Stable mild cardiac enlargement. Vascular pattern normal. Mild hyperinflation suggesting COPD. Unchanged cardiac pacer. No consolidation or effusion.  IMPRESSION: No active cardiopulmonary disease.   Electronically Signed   By: Skipper Cliche M.D.   On: 11/01/2013 11:10     EKG Interpretation   Date/Time:  Sunday November 01 2013 14:59:12 EDT Ventricular Rate:  73 PR Interval:  168 QRS Duration: 145 QT Interval:  453 QTC Calculation: 499 R Axis:   -70 Text  Interpretation:  Atrial-ventricular dual-paced rhythm No further  analysis attempted due to paced rhythm Compared to previous tracing pacer  spikes now visible Confirmed by [facility]  MD, Jenny Reichmann ([id number]) on 11/01/2013  3:58:50 PM      MDM   Final diagnoses:  Near syncope   2:47 PM Discussed case with Dr. Stanford Breed who states this does not sound cardiac in nature as patient did not have chest pain and her pacemaker is functioning properly without arhythmia. She can follow up as an outpatient.    Patient presents to ED for near syncope. Episode resolved spontaneously in 30 minutes. Patient's pacemaker was interrogated which shows no arhythmia. Likely HR reported by nurse at assisted living facility was inaccurate and not in the 30s. Patient did not have any chest pain or shortness of breath during this episode. Patient remained asymptomatic in ED. Labs are reassuring. Patient can follow up with her PCP and her cardiologist. Discussed reasons to return to ED immediately. Vital signs stable for discharge. Dr. Stevie Kern evaluated patient and agrees with plan. Patient / Family / Caregiver informed of clinical course, understand medical decision-making process, and agree with plan.   Elwyn Lade, PA-C 11/01/13 1715

## 2013-11-01 NOTE — ED Notes (Addendum)
[employer] rep at bedside completing pacemaker interrogation

## 2013-11-01 NOTE — ED Notes (Signed)
Pt in from independent living via [facility], per report pt had near syncopal episode this am when standing, denies fall or syncopal episode, denies CP, c/o nausea, denies v/d, SOB, A&O x 4, follows commands, speaks in complete sentences, per report RN at facility palpated HR 30 prior to EMS arrival

## 2013-11-01 NOTE — Discharge Instructions (Signed)
Near-Syncope Near-syncope (commonly known as near fainting) is sudden weakness, dizziness, or feeling like you might pass out. This can happen when getting up or while standing for a long time. It is caused by a sudden decrease in blood flow to the brain, which can occur for various reasons. Most of the reasons are not serious.  HOME CARE Watch your condition for any changes.  Have someone stay with you until you feel stable.  If you feel like you are going to pass out:  Lie down right away.  Prop your feet up if you can.  Breathe deeply and steadily.  Move only when the feeling has gone away. Most of the time, this feeling lasts only a few minutes. You may feel tired for several hours.  Drink enough fluids to keep your pee (urine) clear or pale yellow.  If you are taking blood pressure or heart medicine, stand up slowly.  Follow up with your doctor as told. GET HELP RIGHT AWAY IF:   You have a severe headache.  You have unusual pain in the chest, belly (abdomen), or back.  You have bleeding from the mouth or butt (rectum), or you have black or tarry poop (stool).  You feel your heart beat differently than normal, or you have a very fast pulse.  You pass out, or you twitch and shake when you pass out.  You pass out when sitting or lying down.  You feel confused.  You have trouble walking.  You are weak.  You have vision problems. MAKE SURE YOU:   Understand these instructions.  Will watch your condition.  Will get help right away if you are not doing well or get worse. Document Released: 10/17/2007 Document Revised: 05/05/2013 Document Reviewed: 10/03/2012 [facility] Patient Information 2015 [place], [place]. This information is not intended to replace advice given to you by your health care provider. Make sure you discuss any questions you have with your health care provider.

## 2013-11-01 NOTE — ED Provider Notes (Signed)
Medical screening examination/treatment/procedure(s) were conducted as a shared visit with non-physician practitioner(s) and myself.  I personally evaluated the patient during the encounter.   EKG Interpretation   Date/Time:  Sunday November 01 2013 14:59:12 EDT Ventricular Rate:  73 PR Interval:  168 QRS Duration: 145 QT Interval:  453 QTC Calculation: 499 R Axis:   -70 Text Interpretation:  Atrial-ventricular dual-paced rhythm No further  analysis attempted due to paced rhythm Compared to previous tracing pacer  spikes now visible Confirmed by [facility]  MD, JOHN ([id number]) on 11/01/2013  3:58:50 PM     30 min presyncope with nausea sweats Pacer interrogated without dysrhythmia.  Babette Relic, MD 11/02/13 [phone]

## 2013-11-04 DIAGNOSIS — R42 Dizziness and giddiness: Secondary | ICD-10-CM | POA: Diagnosis not present

## 2013-11-04 DIAGNOSIS — R064 Hyperventilation: Secondary | ICD-10-CM | POA: Diagnosis not present

## 2013-11-04 DIAGNOSIS — R5383 Other fatigue: Secondary | ICD-10-CM | POA: Diagnosis not present

## 2013-11-04 DIAGNOSIS — R5381 Other malaise: Secondary | ICD-10-CM | POA: Diagnosis not present

## 2013-11-04 DIAGNOSIS — R0682 Tachypnea, not elsewhere classified: Secondary | ICD-10-CM | POA: Diagnosis not present

## 2013-11-04 LAB — URINE CULTURE: Colony Count: 60000

## 2013-11-12 ENCOUNTER — Ambulatory Visit (INDEPENDENT_AMBULATORY_CARE_PROVIDER_SITE_OTHER): Payer: Medicare Other | Admitting: Cardiology

## 2013-11-12 DIAGNOSIS — Z79899 Other long term (current) drug therapy: Secondary | ICD-10-CM | POA: Diagnosis not present

## 2013-11-12 DIAGNOSIS — Z7901 Long term (current) use of anticoagulants: Secondary | ICD-10-CM

## 2013-11-12 DIAGNOSIS — I4891 Unspecified atrial fibrillation: Secondary | ICD-10-CM | POA: Diagnosis not present

## 2013-11-12 LAB — PROTIME-INR: INR: 1.8 — AB (ref 0.9–1.1)

## 2013-11-16 ENCOUNTER — Other Ambulatory Visit: Payer: Self-pay

## 2013-11-18 ENCOUNTER — Encounter: Payer: Self-pay | Admitting: Internal Medicine

## 2013-11-18 ENCOUNTER — Ambulatory Visit (INDEPENDENT_AMBULATORY_CARE_PROVIDER_SITE_OTHER): Payer: Medicare Other | Admitting: Internal Medicine

## 2013-11-18 VITALS — BP 129/72 | HR 68 | Ht 61.0 in | Wt 140.0 lb

## 2013-11-18 DIAGNOSIS — R5382 Chronic fatigue, unspecified: Secondary | ICD-10-CM

## 2013-11-18 DIAGNOSIS — R5383 Other fatigue: Secondary | ICD-10-CM

## 2013-11-18 DIAGNOSIS — Z950 Presence of cardiac pacemaker: Secondary | ICD-10-CM | POA: Diagnosis not present

## 2013-11-18 DIAGNOSIS — I442 Atrioventricular block, complete: Secondary | ICD-10-CM | POA: Diagnosis not present

## 2013-11-18 DIAGNOSIS — I4891 Unspecified atrial fibrillation: Secondary | ICD-10-CM

## 2013-11-18 DIAGNOSIS — R5381 Other malaise: Secondary | ICD-10-CM

## 2013-11-18 DIAGNOSIS — I480 Paroxysmal atrial fibrillation: Secondary | ICD-10-CM

## 2013-11-18 LAB — MDC_IDC_ENUM_SESS_TYPE_INCLINIC
Battery Impedance: 201 Ohm
Battery Remaining Longevity: 112 mo
Battery Voltage: 2.79 V
Brady Statistic AP VP Percent: 8 %
Brady Statistic AP VS Percent: 0 %
Brady Statistic AS VP Percent: 92 %
Brady Statistic AS VS Percent: 0 %
Date Time Interrogation Session: 20150708150618
Lead Channel Impedance Value: 634 Ohm
Lead Channel Impedance Value: 903 Ohm
Lead Channel Pacing Threshold Amplitude: 1 V
Lead Channel Pacing Threshold Amplitude: 1.25 V
Lead Channel Pacing Threshold Pulse Width: 0.4 ms
Lead Channel Pacing Threshold Pulse Width: 0.4 ms
Lead Channel Sensing Intrinsic Amplitude: 1.4 mV
Lead Channel Sensing Intrinsic Amplitude: 11.2 mV
Lead Channel Setting Pacing Amplitude: 2 V
Lead Channel Setting Pacing Amplitude: 2.5 V
Lead Channel Setting Pacing Pulse Width: 0.4 ms
Lead Channel Setting Sensing Sensitivity: 2.8 mV

## 2013-11-18 NOTE — Patient Instructions (Signed)
Your physician has requested that you have an echocardiogram. Echocardiography is a painless test that uses sound waves to create images of your heart. It provides your doctor with information about the size and shape of your heart and how well your heart's chambers and valves are working. This procedure takes approximately one hour. There are no restrictions for this procedure.   Remote monitoring is used to monitor your Pacemaker of ICD from home. This monitoring reduces the number of office visits required to check your device to one time per year. It allows [place] to keep an eye on the functioning of your device to ensure it is working properly. You are scheduled for a device check from home on October 12. You may send your transmission at any time that day. If you have a wireless device, the transmission will be sent automatically. After your physician reviews your transmission, you will receive a postcard with your next transmission date.  Your physician wants you to follow-up in: 6 months with [name], [place].   You will receive a reminder letter in the mail two months in advance. If you don't receive a letter, please call our office to schedule the follow-up appointment. Your physician wants you to follow-up in: 1 year with Dr. Rayann Watts.   You will receive a reminder letter in the mail two months in advance. If you don't receive a letter, please call our office to schedule the follow-up appointment.

## 2013-11-18 NOTE — Progress Notes (Signed)
PCP: [name], MD  [name] is a 80 y.o. female who presents today for routine electrophysiology followup.  Since last being seen in our clinic, the patient reports doing reasonably well.  She continues to have chronic difficulty with dizziness and unsteadiness.  This is primarily postural in nature.  She did have presyncope 6/15 for which she went to the ER.  No identifiable causes were found.  Today, she denies symptoms of palpitations, exertional chest pain, shortness of breath,  lower extremity edema, or syncope.  The patient is otherwise without complaint today.   Past Medical History  Diagnosis Date  . Arthritis   . Depression   . Hyperlipidemia   . Hypothyroidism   . HB (heart block)     s/p PPM, most recent generator change 12/11 by JA  . Dementia   . Hiatal hernia   . PTE (post-transplant erythrocytosis)   . Atrial fibrillation   . Pulmonary embolism 2011    on coumadin  . DVT (deep venous thrombosis) 2011  . UTI (lower urinary tract infection)    Past Surgical History  Procedure Laterality Date  . Hemorrhoid surgery    . Pacemaker      most recent generator 12/11 by JA  . Hysterectomy (other)    . Cholecystectomy  09/2010    Current Outpatient Prescriptions  Medication Sig Dispense Refill  . Calcium Carbonate (CALCIUM-CARB 600 PO) Take 1 capsule by mouth daily.       . cholecalciferol (VITAMIN D) 1000 UNITS tablet Take 1,000 Units by mouth as directed.        . citalopram (CELEXA) 20 MG tablet Take 10 mg by mouth daily.       [name] donepezil (ARICEPT) 5 MG tablet Take 5 mg by mouth at bedtime.      [name] HYDROCODONE-CHLORPHENIRAMINE PO Take 1.23 mLs by mouth at bedtime as needed (cough and cold symptoms).       [name] HYDROcodone-homatropine (HYCODAN) 5-1.5 MG/5ML syrup Take 2.5 mLs by mouth as needed.      . levETIRAcetam (KEPPRA) 250 MG tablet Take 62.5 mg by mouth as needed.       [name] levothyroxine (SYNTHROID, LEVOTHROID) 50 MCG tablet Take 50 mcg by mouth daily  before breakfast.      . loratadine (CLARITIN) 10 MG tablet Take 10 mg by mouth as needed.      . memantine (NAMENDA) 10 MG tablet Take 5 mg by mouth 2 (two) times daily.       [name] NITROSTAT 0.4 MG SL tablet Place 0.4 mg under the tongue every 5 (five) minutes as needed.       [name] omeprazole (PRILOSEC) 20 MG capsule Take 20 mg by mouth 2 (two) times daily.       . pravastatin (PRAVACHOL) 20 MG tablet Take 20 mg by mouth daily.        . salsalate (DISALCID) 750 MG tablet Take 750 mg by mouth 3 (three) times daily.       Nicole Dakin Sodium (SENNA-S PO) Take 1 tablet by mouth 2 (two) times daily.       [name] warfarin (COUMADIN) 5 MG tablet Take 5 mg by mouth See admin instructions. Take 5mg  every day. Except on fridays, take 2.5mg        No current facility-administered medications for this visit.   ROS- all systems are reviewed at length today.    Physical Exam: Filed Vitals:   11/18/13 1446 11/18/13 1450 11/18/13 1452 11/18/13 1453  BP:  134/72 131/72 125/72 129/72  Pulse: 70 67 70 68  Height:      Weight:      SpO2: 96% 98% 95% 96%    GEN- The patient is elderly appearing, alert and oriented x 3 today.   Head- normocephalic, atraumatic Eyes-  Sclera clear, conjunctiva pink Ears- hearing intact Oropharynx- clear Lungs- Clear to ausculation bilaterally, normal work of breathing Chest- pacemaker pocket is well healed Heart- Regular rate and rhythm, 2/6 SEM LSB GI- soft, NT, ND, + BS Extremities- no clubbing, cyanosis, or edema  Pacemaker interrogation- reviewed in detail today,  See PACEART report Orthostatic are reviewed- she has symptoms with standing of dizziness but without change in her vitals  Assessment and Plan:  1. Complete heart block Normal pacemaker function See Pace Art report No changes today  2. Afib/ h/o PTE Continue long term anticoagulation with coumadin  3. Postural dizziness She has chronic issues with lightheadedness and unsteadiness.  She is not  orthostatic by BP or HR today but clearly has her symptoms with standing.  I suspect neurologic cause.  She will continue to follow with Dr Doy Mince.  I will obtain an echo to follow-up on structural findings from 2013.  If echo is unchanged then no further workup is planned at this time.  [facility] Follow-up with Truitt Merle in 6 months I will see in a year

## 2013-11-23 ENCOUNTER — Ambulatory Visit (INDEPENDENT_AMBULATORY_CARE_PROVIDER_SITE_OTHER): Payer: Medicare Other | Admitting: Internal Medicine

## 2013-11-23 DIAGNOSIS — I4891 Unspecified atrial fibrillation: Secondary | ICD-10-CM | POA: Diagnosis not present

## 2013-11-23 DIAGNOSIS — Z7901 Long term (current) use of anticoagulants: Secondary | ICD-10-CM | POA: Diagnosis not present

## 2013-11-23 DIAGNOSIS — Z5181 Encounter for therapeutic drug level monitoring: Secondary | ICD-10-CM | POA: Insufficient documentation

## 2013-11-23 DIAGNOSIS — Z7189 Other specified counseling: Secondary | ICD-10-CM | POA: Insufficient documentation

## 2013-11-23 LAB — PROTIME-INR: INR: 2.4 — AB (ref 0.9–1.1)

## 2013-12-02 ENCOUNTER — Ambulatory Visit (HOSPITAL_COMMUNITY): Payer: Medicare Other | Attending: Internal Medicine | Admitting: Radiology

## 2013-12-02 DIAGNOSIS — I079 Rheumatic tricuspid valve disease, unspecified: Secondary | ICD-10-CM | POA: Diagnosis not present

## 2013-12-02 DIAGNOSIS — R5381 Other malaise: Secondary | ICD-10-CM | POA: Diagnosis not present

## 2013-12-02 DIAGNOSIS — I959 Hypotension, unspecified: Secondary | ICD-10-CM | POA: Diagnosis not present

## 2013-12-02 DIAGNOSIS — I480 Paroxysmal atrial fibrillation: Secondary | ICD-10-CM

## 2013-12-02 DIAGNOSIS — I359 Nonrheumatic aortic valve disorder, unspecified: Secondary | ICD-10-CM | POA: Diagnosis not present

## 2013-12-02 DIAGNOSIS — R5383 Other fatigue: Secondary | ICD-10-CM | POA: Diagnosis not present

## 2013-12-02 DIAGNOSIS — R079 Chest pain, unspecified: Secondary | ICD-10-CM | POA: Diagnosis not present

## 2013-12-02 DIAGNOSIS — I379 Nonrheumatic pulmonary valve disorder, unspecified: Secondary | ICD-10-CM | POA: Diagnosis not present

## 2013-12-02 DIAGNOSIS — R609 Edema, unspecified: Secondary | ICD-10-CM | POA: Diagnosis not present

## 2013-12-02 DIAGNOSIS — I2699 Other pulmonary embolism without acute cor pulmonale: Secondary | ICD-10-CM | POA: Diagnosis not present

## 2013-12-02 DIAGNOSIS — I442 Atrioventricular block, complete: Secondary | ICD-10-CM

## 2013-12-02 DIAGNOSIS — I059 Rheumatic mitral valve disease, unspecified: Secondary | ICD-10-CM | POA: Diagnosis not present

## 2013-12-02 DIAGNOSIS — I4891 Unspecified atrial fibrillation: Secondary | ICD-10-CM | POA: Diagnosis not present

## 2013-12-02 DIAGNOSIS — I82409 Acute embolism and thrombosis of unspecified deep veins of unspecified lower extremity: Secondary | ICD-10-CM | POA: Diagnosis not present

## 2013-12-02 NOTE — Progress Notes (Signed)
Echocardiogram performed.  

## 2013-12-04 DIAGNOSIS — H2640 Unspecified secondary cataract: Secondary | ICD-10-CM | POA: Diagnosis not present

## 2013-12-04 DIAGNOSIS — Z961 Presence of intraocular lens: Secondary | ICD-10-CM | POA: Diagnosis not present

## 2013-12-04 DIAGNOSIS — H02409 Unspecified ptosis of unspecified eyelid: Secondary | ICD-10-CM | POA: Diagnosis not present

## 2013-12-04 DIAGNOSIS — H02059 Trichiasis without entropian unspecified eye, unspecified eyelid: Secondary | ICD-10-CM | POA: Diagnosis not present

## 2013-12-07 ENCOUNTER — Ambulatory Visit (INDEPENDENT_AMBULATORY_CARE_PROVIDER_SITE_OTHER): Payer: Medicare Other | Admitting: Cardiology

## 2013-12-07 DIAGNOSIS — Z5181 Encounter for therapeutic drug level monitoring: Secondary | ICD-10-CM

## 2013-12-07 DIAGNOSIS — Z7901 Long term (current) use of anticoagulants: Secondary | ICD-10-CM | POA: Diagnosis not present

## 2013-12-07 DIAGNOSIS — I4891 Unspecified atrial fibrillation: Secondary | ICD-10-CM

## 2013-12-07 LAB — PROTIME-INR: INR: 2.6 — AB (ref 0.9–1.1)

## 2013-12-17 DIAGNOSIS — H2640 Unspecified secondary cataract: Secondary | ICD-10-CM | POA: Diagnosis not present

## 2013-12-17 DIAGNOSIS — H26499 Other secondary cataract, unspecified eye: Secondary | ICD-10-CM | POA: Diagnosis not present

## 2013-12-21 ENCOUNTER — Ambulatory Visit (INDEPENDENT_AMBULATORY_CARE_PROVIDER_SITE_OTHER): Payer: Medicare Other | Admitting: Cardiovascular Disease

## 2013-12-21 DIAGNOSIS — Z5181 Encounter for therapeutic drug level monitoring: Secondary | ICD-10-CM

## 2013-12-21 DIAGNOSIS — I4891 Unspecified atrial fibrillation: Secondary | ICD-10-CM | POA: Diagnosis not present

## 2013-12-21 DIAGNOSIS — Z7901 Long term (current) use of anticoagulants: Secondary | ICD-10-CM | POA: Diagnosis not present

## 2013-12-21 LAB — PROTIME-INR: INR: 2.3 — AB (ref 0.9–1.1)

## 2014-01-14 ENCOUNTER — Ambulatory Visit (INDEPENDENT_AMBULATORY_CARE_PROVIDER_SITE_OTHER): Payer: Medicare Other | Admitting: Cardiology

## 2014-01-14 DIAGNOSIS — Z5181 Encounter for therapeutic drug level monitoring: Secondary | ICD-10-CM

## 2014-01-14 DIAGNOSIS — I4891 Unspecified atrial fibrillation: Secondary | ICD-10-CM | POA: Diagnosis not present

## 2014-01-14 DIAGNOSIS — Z7901 Long term (current) use of anticoagulants: Secondary | ICD-10-CM | POA: Diagnosis not present

## 2014-01-14 LAB — PROTIME-INR
INR: 2.7 — AB (ref 0.9–1.1)
INR: 2.7 — AB (ref <=1.1)

## 2014-01-20 ENCOUNTER — Encounter: Payer: Self-pay | Admitting: Internal Medicine

## 2014-02-01 ENCOUNTER — Encounter: Payer: Self-pay | Admitting: Internal Medicine

## 2014-02-11 ENCOUNTER — Ambulatory Visit (INDEPENDENT_AMBULATORY_CARE_PROVIDER_SITE_OTHER): Payer: Medicare Other | Admitting: Internal Medicine

## 2014-02-11 DIAGNOSIS — Z7901 Long term (current) use of anticoagulants: Secondary | ICD-10-CM | POA: Diagnosis not present

## 2014-02-11 DIAGNOSIS — Z5181 Encounter for therapeutic drug level monitoring: Secondary | ICD-10-CM

## 2014-02-11 DIAGNOSIS — I4891 Unspecified atrial fibrillation: Secondary | ICD-10-CM

## 2014-02-11 LAB — PROTIME-INR: INR: 2.8 — AB (ref 0.9–1.1)

## 2014-02-17 DIAGNOSIS — Z1389 Encounter for screening for other disorder: Secondary | ICD-10-CM | POA: Diagnosis not present

## 2014-02-17 DIAGNOSIS — Z0000 Encounter for general adult medical examination without abnormal findings: Secondary | ICD-10-CM | POA: Diagnosis not present

## 2014-02-17 DIAGNOSIS — E039 Hypothyroidism, unspecified: Secondary | ICD-10-CM | POA: Diagnosis not present

## 2014-02-17 DIAGNOSIS — E784 Other hyperlipidemia: Secondary | ICD-10-CM | POA: Diagnosis not present

## 2014-02-17 DIAGNOSIS — M810 Age-related osteoporosis without current pathological fracture: Secondary | ICD-10-CM | POA: Diagnosis not present

## 2014-02-22 ENCOUNTER — Encounter: Payer: Self-pay | Admitting: Internal Medicine

## 2014-02-22 ENCOUNTER — Ambulatory Visit (INDEPENDENT_AMBULATORY_CARE_PROVIDER_SITE_OTHER): Payer: Medicare Other | Admitting: *Deleted

## 2014-02-22 ENCOUNTER — Telehealth: Payer: Self-pay | Admitting: Cardiology

## 2014-02-22 DIAGNOSIS — I442 Atrioventricular block, complete: Secondary | ICD-10-CM

## 2014-02-22 LAB — MDC_IDC_ENUM_SESS_TYPE_REMOTE
Battery Impedance: 224 Ohm
Battery Remaining Longevity: 107 mo
Battery Voltage: 2.79 V
Brady Statistic AP VP Percent: 14 %
Brady Statistic AP VS Percent: 0 %
Brady Statistic AS VP Percent: 86 %
Brady Statistic AS VS Percent: 0 %
Date Time Interrogation Session: 20151012175029
Lead Channel Impedance Value: 613 Ohm
Lead Channel Impedance Value: 903 Ohm
Lead Channel Pacing Threshold Amplitude: 1.625 V
Lead Channel Pacing Threshold Amplitude: 1.75 V
Lead Channel Pacing Threshold Pulse Width: 0.4 ms
Lead Channel Pacing Threshold Pulse Width: 0.4 ms
Lead Channel Setting Pacing Amplitude: 3.25 V
Lead Channel Setting Pacing Amplitude: 3.5 V
Lead Channel Setting Pacing Pulse Width: 0.4 ms
Lead Channel Setting Sensing Sensitivity: 2.8 mV

## 2014-02-22 NOTE — Telephone Encounter (Signed)
LMOVM reminding pt to send remote transmission.   

## 2014-02-22 NOTE — Progress Notes (Signed)
Remote pacemaker transmission.   

## 2014-02-24 DIAGNOSIS — F419 Anxiety disorder, unspecified: Secondary | ICD-10-CM | POA: Diagnosis not present

## 2014-02-24 DIAGNOSIS — M810 Age-related osteoporosis without current pathological fracture: Secondary | ICD-10-CM | POA: Diagnosis not present

## 2014-02-24 DIAGNOSIS — E785 Hyperlipidemia, unspecified: Secondary | ICD-10-CM | POA: Diagnosis not present

## 2014-02-24 DIAGNOSIS — Z23 Encounter for immunization: Secondary | ICD-10-CM | POA: Diagnosis not present

## 2014-02-24 DIAGNOSIS — E039 Hypothyroidism, unspecified: Secondary | ICD-10-CM | POA: Diagnosis not present

## 2014-02-26 NOTE — Telephone Encounter (Signed)
Nothing was typed/tmj 

## 2014-03-11 ENCOUNTER — Ambulatory Visit (INDEPENDENT_AMBULATORY_CARE_PROVIDER_SITE_OTHER): Payer: Medicare Other | Admitting: Internal Medicine

## 2014-03-11 ENCOUNTER — Encounter: Payer: Self-pay | Admitting: Cardiology

## 2014-03-11 DIAGNOSIS — I4891 Unspecified atrial fibrillation: Secondary | ICD-10-CM | POA: Diagnosis not present

## 2014-03-11 DIAGNOSIS — Z7901 Long term (current) use of anticoagulants: Secondary | ICD-10-CM | POA: Diagnosis not present

## 2014-03-11 DIAGNOSIS — Z5181 Encounter for therapeutic drug level monitoring: Secondary | ICD-10-CM

## 2014-03-11 LAB — PROTIME-INR: INR: 3 — AB (ref 0.9–1.1)

## 2014-03-25 ENCOUNTER — Ambulatory Visit (INDEPENDENT_AMBULATORY_CARE_PROVIDER_SITE_OTHER): Payer: Medicare Other | Admitting: Cardiology

## 2014-03-25 DIAGNOSIS — I4891 Unspecified atrial fibrillation: Secondary | ICD-10-CM | POA: Diagnosis not present

## 2014-03-25 DIAGNOSIS — Z7901 Long term (current) use of anticoagulants: Secondary | ICD-10-CM | POA: Diagnosis not present

## 2014-03-25 DIAGNOSIS — Z5181 Encounter for therapeutic drug level monitoring: Secondary | ICD-10-CM

## 2014-03-25 LAB — PROTIME-INR: INR: 2.7 — AB (ref 0.9–1.1)

## 2014-04-22 ENCOUNTER — Ambulatory Visit (INDEPENDENT_AMBULATORY_CARE_PROVIDER_SITE_OTHER): Payer: Medicare Other | Admitting: Internal Medicine

## 2014-04-22 DIAGNOSIS — I4891 Unspecified atrial fibrillation: Secondary | ICD-10-CM | POA: Diagnosis not present

## 2014-04-22 DIAGNOSIS — Z7901 Long term (current) use of anticoagulants: Secondary | ICD-10-CM | POA: Diagnosis not present

## 2014-04-22 DIAGNOSIS — Z5181 Encounter for therapeutic drug level monitoring: Secondary | ICD-10-CM

## 2014-04-22 LAB — PROTIME-INR: INR: 3 — AB (ref 0.9–1.1)

## 2014-04-28 DIAGNOSIS — R4189 Other symptoms and signs involving cognitive functions and awareness: Secondary | ICD-10-CM | POA: Diagnosis not present

## 2014-04-28 DIAGNOSIS — R269 Unspecified abnormalities of gait and mobility: Secondary | ICD-10-CM | POA: Diagnosis not present

## 2014-04-28 DIAGNOSIS — F419 Anxiety disorder, unspecified: Secondary | ICD-10-CM | POA: Diagnosis not present

## 2014-05-11 ENCOUNTER — Other Ambulatory Visit: Payer: Self-pay | Admitting: *Deleted

## 2014-05-11 MED ORDER — WARFARIN SODIUM 5 MG PO TABS: ORAL_TABLET | ORAL | Status: DC

## 2014-05-11 NOTE — Telephone Encounter (Signed)
Refill done as requested 

## 2014-05-12 DIAGNOSIS — R262 Difficulty in walking, not elsewhere classified: Secondary | ICD-10-CM | POA: Diagnosis not present

## 2014-05-12 DIAGNOSIS — R261 Paralytic gait: Secondary | ICD-10-CM | POA: Diagnosis not present

## 2014-05-18 DIAGNOSIS — R2681 Unsteadiness on feet: Secondary | ICD-10-CM | POA: Diagnosis not present

## 2014-05-18 DIAGNOSIS — R262 Difficulty in walking, not elsewhere classified: Secondary | ICD-10-CM | POA: Diagnosis not present

## 2014-05-20 ENCOUNTER — Ambulatory Visit (INDEPENDENT_AMBULATORY_CARE_PROVIDER_SITE_OTHER): Payer: Medicare Other | Admitting: Pharmacist

## 2014-05-20 DIAGNOSIS — Z7901 Long term (current) use of anticoagulants: Secondary | ICD-10-CM | POA: Diagnosis not present

## 2014-05-20 DIAGNOSIS — R262 Difficulty in walking, not elsewhere classified: Secondary | ICD-10-CM | POA: Diagnosis not present

## 2014-05-20 DIAGNOSIS — Z5181 Encounter for therapeutic drug level monitoring: Secondary | ICD-10-CM

## 2014-05-20 DIAGNOSIS — R2681 Unsteadiness on feet: Secondary | ICD-10-CM | POA: Diagnosis not present

## 2014-05-20 DIAGNOSIS — I4891 Unspecified atrial fibrillation: Secondary | ICD-10-CM

## 2014-05-20 LAB — PROTIME-INR: INR: 2.1 — AB (ref 0.9–1.1)

## 2014-05-25 DIAGNOSIS — R262 Difficulty in walking, not elsewhere classified: Secondary | ICD-10-CM | POA: Diagnosis not present

## 2014-05-25 DIAGNOSIS — R2681 Unsteadiness on feet: Secondary | ICD-10-CM | POA: Diagnosis not present

## 2014-05-27 ENCOUNTER — Ambulatory Visit (INDEPENDENT_AMBULATORY_CARE_PROVIDER_SITE_OTHER): Payer: Medicare Other | Admitting: *Deleted

## 2014-05-27 ENCOUNTER — Encounter: Payer: Self-pay | Admitting: Internal Medicine

## 2014-05-27 DIAGNOSIS — R2681 Unsteadiness on feet: Secondary | ICD-10-CM | POA: Diagnosis not present

## 2014-05-27 DIAGNOSIS — R262 Difficulty in walking, not elsewhere classified: Secondary | ICD-10-CM | POA: Diagnosis not present

## 2014-05-27 DIAGNOSIS — I442 Atrioventricular block, complete: Secondary | ICD-10-CM | POA: Diagnosis not present

## 2014-05-27 LAB — MDC_IDC_ENUM_SESS_TYPE_REMOTE
Battery Impedance: 224 Ohm
Battery Remaining Longevity: 111 mo
Battery Voltage: 2.79 V
Brady Statistic AP VP Percent: 15 %
Brady Statistic AP VS Percent: 0 %
Brady Statistic AS VP Percent: 85 %
Brady Statistic AS VS Percent: 0 %
Date Time Interrogation Session: 20160114173948
Lead Channel Impedance Value: 613 Ohm
Lead Channel Impedance Value: 939 Ohm
Lead Channel Pacing Threshold Amplitude: 1.375 V
Lead Channel Pacing Threshold Amplitude: 1.625 V
Lead Channel Pacing Threshold Pulse Width: 0.4 ms
Lead Channel Pacing Threshold Pulse Width: 0.4 ms
Lead Channel Setting Pacing Amplitude: 3 V
Lead Channel Setting Pacing Amplitude: 3.25 V
Lead Channel Setting Pacing Pulse Width: 0.4 ms
Lead Channel Setting Sensing Sensitivity: 2.8 mV

## 2014-05-27 NOTE — Progress Notes (Signed)
Remote pacemaker transmission.   

## 2014-06-01 ENCOUNTER — Ambulatory Visit: Payer: Medicare Other | Admitting: Nurse Practitioner

## 2014-06-01 DIAGNOSIS — R262 Difficulty in walking, not elsewhere classified: Secondary | ICD-10-CM | POA: Diagnosis not present

## 2014-06-01 DIAGNOSIS — R2681 Unsteadiness on feet: Secondary | ICD-10-CM | POA: Diagnosis not present

## 2014-06-02 ENCOUNTER — Encounter: Payer: Self-pay | Admitting: Cardiology

## 2014-06-03 DIAGNOSIS — R262 Difficulty in walking, not elsewhere classified: Secondary | ICD-10-CM | POA: Diagnosis not present

## 2014-06-03 DIAGNOSIS — R2681 Unsteadiness on feet: Secondary | ICD-10-CM | POA: Diagnosis not present

## 2014-06-08 DIAGNOSIS — R262 Difficulty in walking, not elsewhere classified: Secondary | ICD-10-CM | POA: Diagnosis not present

## 2014-06-08 DIAGNOSIS — R2681 Unsteadiness on feet: Secondary | ICD-10-CM | POA: Diagnosis not present

## 2014-06-10 DIAGNOSIS — K921 Melena: Secondary | ICD-10-CM | POA: Diagnosis not present

## 2014-06-15 DIAGNOSIS — R2681 Unsteadiness on feet: Secondary | ICD-10-CM | POA: Diagnosis not present

## 2014-06-16 DIAGNOSIS — H02834 Dermatochalasis of left upper eyelid: Secondary | ICD-10-CM | POA: Diagnosis not present

## 2014-06-16 DIAGNOSIS — H02831 Dermatochalasis of right upper eyelid: Secondary | ICD-10-CM | POA: Diagnosis not present

## 2014-06-17 ENCOUNTER — Ambulatory Visit (INDEPENDENT_AMBULATORY_CARE_PROVIDER_SITE_OTHER): Payer: Medicare Other | Admitting: Cardiology

## 2014-06-17 DIAGNOSIS — Z7901 Long term (current) use of anticoagulants: Secondary | ICD-10-CM | POA: Diagnosis not present

## 2014-06-17 DIAGNOSIS — R2681 Unsteadiness on feet: Secondary | ICD-10-CM | POA: Diagnosis not present

## 2014-06-17 DIAGNOSIS — I4891 Unspecified atrial fibrillation: Secondary | ICD-10-CM | POA: Diagnosis not present

## 2014-06-17 DIAGNOSIS — Z5181 Encounter for therapeutic drug level monitoring: Secondary | ICD-10-CM

## 2014-06-17 LAB — PROTIME-INR: INR: 1.6 — AB (ref 0.9–1.1)

## 2014-06-22 DIAGNOSIS — R2681 Unsteadiness on feet: Secondary | ICD-10-CM | POA: Diagnosis not present

## 2014-06-24 DIAGNOSIS — R2681 Unsteadiness on feet: Secondary | ICD-10-CM | POA: Diagnosis not present

## 2014-06-29 ENCOUNTER — Ambulatory Visit: Payer: Medicare Other | Admitting: Nurse Practitioner

## 2014-07-01 ENCOUNTER — Ambulatory Visit (INDEPENDENT_AMBULATORY_CARE_PROVIDER_SITE_OTHER): Payer: PRIVATE HEALTH INSURANCE | Admitting: Cardiology

## 2014-07-01 DIAGNOSIS — Z7901 Long term (current) use of anticoagulants: Secondary | ICD-10-CM | POA: Diagnosis not present

## 2014-07-01 DIAGNOSIS — Z5181 Encounter for therapeutic drug level monitoring: Secondary | ICD-10-CM

## 2014-07-01 DIAGNOSIS — R2681 Unsteadiness on feet: Secondary | ICD-10-CM | POA: Diagnosis not present

## 2014-07-01 DIAGNOSIS — I4891 Unspecified atrial fibrillation: Secondary | ICD-10-CM

## 2014-07-01 DIAGNOSIS — I5021 Acute systolic (congestive) heart failure: Secondary | ICD-10-CM | POA: Diagnosis not present

## 2014-07-01 LAB — PROTIME-INR: INR: 2.2 — AB (ref 0.9–1.1)

## 2014-07-06 DIAGNOSIS — R2681 Unsteadiness on feet: Secondary | ICD-10-CM | POA: Diagnosis not present

## 2014-07-08 DIAGNOSIS — R2681 Unsteadiness on feet: Secondary | ICD-10-CM | POA: Diagnosis not present

## 2014-07-13 DIAGNOSIS — H02834 Dermatochalasis of left upper eyelid: Secondary | ICD-10-CM | POA: Diagnosis not present

## 2014-07-13 DIAGNOSIS — R262 Difficulty in walking, not elsewhere classified: Secondary | ICD-10-CM | POA: Diagnosis not present

## 2014-07-13 DIAGNOSIS — H02831 Dermatochalasis of right upper eyelid: Secondary | ICD-10-CM | POA: Diagnosis not present

## 2014-07-13 DIAGNOSIS — H02054 Trichiasis without entropian left upper eyelid: Secondary | ICD-10-CM | POA: Diagnosis not present

## 2014-07-13 DIAGNOSIS — H02051 Trichiasis without entropian right upper eyelid: Secondary | ICD-10-CM | POA: Diagnosis not present

## 2014-07-15 DIAGNOSIS — R262 Difficulty in walking, not elsewhere classified: Secondary | ICD-10-CM | POA: Diagnosis not present

## 2014-07-20 DIAGNOSIS — R262 Difficulty in walking, not elsewhere classified: Secondary | ICD-10-CM | POA: Diagnosis not present

## 2014-07-22 ENCOUNTER — Ambulatory Visit (INDEPENDENT_AMBULATORY_CARE_PROVIDER_SITE_OTHER): Payer: Medicare Other | Admitting: Internal Medicine

## 2014-07-22 DIAGNOSIS — R262 Difficulty in walking, not elsewhere classified: Secondary | ICD-10-CM | POA: Diagnosis not present

## 2014-07-22 DIAGNOSIS — Z5181 Encounter for therapeutic drug level monitoring: Secondary | ICD-10-CM

## 2014-07-22 DIAGNOSIS — I4891 Unspecified atrial fibrillation: Secondary | ICD-10-CM | POA: Diagnosis not present

## 2014-07-22 DIAGNOSIS — Z7901 Long term (current) use of anticoagulants: Secondary | ICD-10-CM | POA: Diagnosis not present

## 2014-07-22 LAB — PROTIME-INR: INR: 1.9 — AB (ref 0.9–1.1)

## 2014-07-27 DIAGNOSIS — R262 Difficulty in walking, not elsewhere classified: Secondary | ICD-10-CM | POA: Diagnosis not present

## 2014-07-29 DIAGNOSIS — R262 Difficulty in walking, not elsewhere classified: Secondary | ICD-10-CM | POA: Diagnosis not present

## 2014-08-01 ENCOUNTER — Emergency Department (HOSPITAL_COMMUNITY)
Admission: EM | Admit: 2014-08-01 | Discharge: 2014-08-02 | Disposition: A | Discharge status: Home / Self Care | Payer: Medicare Other | Attending: Emergency Medicine | Admitting: Emergency Medicine

## 2014-08-01 ENCOUNTER — Encounter (HOSPITAL_COMMUNITY): Payer: Self-pay | Admitting: Oncology

## 2014-08-01 DIAGNOSIS — E785 Hyperlipidemia, unspecified: Secondary | ICD-10-CM | POA: Diagnosis not present

## 2014-08-01 DIAGNOSIS — F0390 Unspecified dementia without behavioral disturbance: Secondary | ICD-10-CM | POA: Diagnosis not present

## 2014-08-01 DIAGNOSIS — Z8679 Personal history of other diseases of the circulatory system: Secondary | ICD-10-CM | POA: Diagnosis not present

## 2014-08-01 DIAGNOSIS — R011 Cardiac murmur, unspecified: Secondary | ICD-10-CM | POA: Diagnosis not present

## 2014-08-01 DIAGNOSIS — M79605 Pain in left leg: Secondary | ICD-10-CM | POA: Diagnosis not present

## 2014-08-01 DIAGNOSIS — Z86711 Personal history of pulmonary embolism: Secondary | ICD-10-CM | POA: Insufficient documentation

## 2014-08-01 DIAGNOSIS — F329 Major depressive disorder, single episode, unspecified: Secondary | ICD-10-CM | POA: Diagnosis not present

## 2014-08-01 DIAGNOSIS — M7989 Other specified soft tissue disorders: Secondary | ICD-10-CM | POA: Diagnosis not present

## 2014-08-01 DIAGNOSIS — R0902 Hypoxemia: Secondary | ICD-10-CM | POA: Diagnosis not present

## 2014-08-01 DIAGNOSIS — E039 Hypothyroidism, unspecified: Secondary | ICD-10-CM | POA: Insufficient documentation

## 2014-08-01 DIAGNOSIS — I4891 Unspecified atrial fibrillation: Secondary | ICD-10-CM | POA: Insufficient documentation

## 2014-08-01 DIAGNOSIS — Z8719 Personal history of other diseases of the digestive system: Secondary | ICD-10-CM | POA: Insufficient documentation

## 2014-08-01 DIAGNOSIS — M79662 Pain in left lower leg: Secondary | ICD-10-CM

## 2014-08-01 DIAGNOSIS — Z7901 Long term (current) use of anticoagulants: Secondary | ICD-10-CM | POA: Insufficient documentation

## 2014-08-01 DIAGNOSIS — Z86718 Personal history of other venous thrombosis and embolism: Secondary | ICD-10-CM | POA: Diagnosis not present

## 2014-08-01 DIAGNOSIS — Z79899 Other long term (current) drug therapy: Secondary | ICD-10-CM | POA: Insufficient documentation

## 2014-08-01 DIAGNOSIS — Z87440 Personal history of urinary (tract) infections: Secondary | ICD-10-CM | POA: Diagnosis not present

## 2014-08-01 DIAGNOSIS — R9431 Abnormal electrocardiogram [ECG] [EKG]: Secondary | ICD-10-CM | POA: Diagnosis not present

## 2014-08-01 DIAGNOSIS — M25562 Pain in left knee: Secondary | ICD-10-CM | POA: Diagnosis not present

## 2014-08-01 NOTE — ED Notes (Signed)
Per EMS pt is from [facility].  Pt presents d/t left leg pain from knee down since 1800 this evening.  Pt has a hx of DVT and is concerned after speaking to someone at the ALF that pain may be a reoccurrence.

## 2014-08-01 NOTE — ED Notes (Signed)
Pt's O2 sat decreased to 85% on RA.  2 L O2 via [place] placed on pt with a rebound in O2 sat to 98%.  Oz decreased to 1L via [place] O2 maintaining at 98%.

## 2014-08-01 NOTE — ED Notes (Signed)
Bed: PV37 Expected date: 08/01/14 Expected time: 10:17 PM Means of arrival: Ambulance Comments: 81 yr old, leg pain and swelling

## 2014-08-02 ENCOUNTER — Encounter (HOSPITAL_COMMUNITY): Payer: Self-pay

## 2014-08-02 ENCOUNTER — Emergency Department (HOSPITAL_COMMUNITY): Payer: Medicare Other

## 2014-08-02 ENCOUNTER — Ambulatory Visit (HOSPITAL_COMMUNITY): Payer: Medicare Other

## 2014-08-02 DIAGNOSIS — R0902 Hypoxemia: Secondary | ICD-10-CM | POA: Diagnosis not present

## 2014-08-02 DIAGNOSIS — M7989 Other specified soft tissue disorders: Secondary | ICD-10-CM | POA: Diagnosis not present

## 2014-08-02 DIAGNOSIS — Z86711 Personal history of pulmonary embolism: Secondary | ICD-10-CM | POA: Diagnosis not present

## 2014-08-02 LAB — CBC WITH DIFFERENTIAL/PLATELET
Basophils Absolute: 0 10*3/uL (ref 0.0–0.1)
Basophils Relative: 1 % (ref 0–1)
Eosinophils Absolute: 0.1 10*3/uL (ref 0.0–0.7)
Eosinophils Relative: 2 % (ref 0–5)
HCT: 37.8 % (ref 36.0–46.0)
Hemoglobin: 12.3 g/dL (ref 12.0–15.0)
Lymphocytes Relative: 39 % (ref 12–46)
Lymphs Abs: 2.3 10*3/uL (ref 0.7–4.0)
MCH: 29.9 pg (ref 26.0–34.0)
MCHC: 32.5 g/dL (ref 30.0–36.0)
MCV: 92 fL (ref 78.0–100.0)
Monocytes Absolute: 0.5 10*3/uL (ref 0.1–1.0)
Monocytes Relative: 9 % (ref 3–12)
Neutro Abs: 3 10*3/uL (ref 1.7–7.7)
Neutrophils Relative %: 49 % (ref 43–77)
Platelets: 177 10*3/uL (ref 150–400)
RBC: 4.11 MIL/uL (ref 3.87–5.11)
RDW: 14.2 % (ref 11.5–15.5)
WBC: 6.1 10*3/uL (ref 4.0–10.5)

## 2014-08-02 LAB — PROTIME-INR
INR: 1.98 — ABNORMAL HIGH (ref 0.00–1.49)
Prothrombin Time: 22.7 seconds — ABNORMAL HIGH (ref 11.6–15.2)

## 2014-08-02 LAB — BASIC METABOLIC PANEL
Anion gap: 8 (ref 5–15)
BUN: 24 mg/dL — ABNORMAL HIGH (ref 6–23)
CO2: 26 mmol/L (ref 19–32)
Calcium: 9.2 mg/dL (ref 8.4–10.5)
Chloride: 105 mmol/L (ref 96–112)
Creatinine, Ser: 0.9 mg/dL (ref 0.50–1.10)
GFR calc Af Amer: 64 mL/min — ABNORMAL LOW (ref >90)
GFR calc non Af Amer: 55 mL/min — ABNORMAL LOW (ref >90)
Glucose, Bld: 109 mg/dL — ABNORMAL HIGH (ref 70–99)
Potassium: 4.1 mmol/L (ref 3.5–5.1)
Sodium: 139 mmol/L (ref 135–145)

## 2014-08-02 MED ORDER — IOHEXOL 350 MG/ML SOLN
100.0000 mL | Freq: Once | INTRAVENOUS | Status: AC | PRN
  Administered 2014-08-02: 100 mL via INTRAVENOUS

## 2014-08-02 MED ORDER — ENOXAPARIN SODIUM 150 MG/ML ~~LOC~~ SOLN
1.0000 mg/kg | Freq: Once | SUBCUTANEOUS | Status: DC
  Filled 2014-08-02: qty 1

## 2014-08-02 MED ORDER — ENOXAPARIN SODIUM 80 MG/0.8ML ~~LOC~~ SOLN
65.0000 mg | Freq: Once | SUBCUTANEOUS | Status: AC
  Administered 2014-08-02: 65 mg via SUBCUTANEOUS
  Filled 2014-08-02: qty 0.8

## 2014-08-02 NOTE — ED Notes (Signed)
Patient transported to CT 

## 2014-08-02 NOTE — Discharge Instructions (Signed)
MPORTANT PATIENT INSTRUCTIONS:  You have been scheduled for an Outpatient Vascular Study at [facility].    If tomorrow is a Saturday or Sunday, please go to the [facility] Emergency Department Registration Desk at 8:30 am tomorrow morning and tell them you are there for a vascular study.  If tomorrow is a weekday (Monday-Friday), please go to [facility] Department at 8:30 am and tell them you  are  there for a vascular study.  Your workup today showed that your INR was 1.98 which is still slightly low but improving.  Your CAT scan did not show signs of blood clot.  You will need to follow-up with the Vascular lab tomorrow to have imaging done of your left lower leg to check for blood clots there.

## 2014-08-02 NOTE — ED Notes (Signed)
Patient ambulated without difficulty using staff as support. Patient 's 02 Sat maintained at 97%-100%.

## 2014-08-02 NOTE — ED Provider Notes (Addendum)
CSN: [id number]     Arrival date & time 08/01/14  2227 History   First MD Initiated Contact with Patient 08/01/14 2306     Chief Complaint  Patient presents with  . Leg Pain     (Consider location/radiation/quality/duration/timing/severity/associated sxs/prior Treatment) HPI 81 year old female presents to the emergency department via EMS from her nursing facility with complaint of left lower leg pain that started around 6 PM tonight.  Patient noted to be hypoxic upon arrival 85% on room air.  She denies feeling short of breath.  Patient has history of DVT and PE in the past.  She is on Coumadin for A. fib, most recent INR subtherapeutic.  She denies any trauma to the leg.  No fevers or chills.  No other complaints Past Medical History  Diagnosis Date  . Arthritis   . Depression   . Hyperlipidemia   . Hypothyroidism   . HB (heart block)     s/p PPM, most recent generator change 12/11 by JA  . Dementia   . Hiatal hernia   . PTE (post-transplant erythrocytosis)   . Atrial fibrillation   . Pulmonary embolism 2011    on coumadin  . DVT (deep venous thrombosis) 2011  . UTI (lower urinary tract infection)    Past Surgical History  Procedure Laterality Date  . Hemorrhoid surgery    . Pacemaker      most recent generator 12/11 by JA  . Hysterectomy (other)    . Cholecystectomy  09/2010  . Total knee arthroplasty Left   . Joint replacement     Family History  Problem Relation Age of Onset  . Stomach cancer Brother   . Stomach cancer Brother   . Liver cancer Brother   . Stroke Father   . Heart disease Father   . Arthritis Father   . Breast cancer Sister   . Heart disease Mother   . Arthritis Mother    History  Substance Use Topics  . Smoking status: Never Smoker   . Smokeless tobacco: Not on file  . Alcohol Use: No   OB History    No data available     Review of Systems  Unable to perform ROS: Dementia      Allergies  Review of patient's allergies indicates no  known allergies.  Home Medications   Prior to Admission medications   Medication Sig Start Date End Date Taking? Authorizing Provider  Calcium Carbonate (CALCIUM-CARB 600 PO) Take 1 capsule by mouth daily.     Historical Provider, MD  cholecalciferol (VITAMIN D) 1000 UNITS tablet Take 1,000 Units by mouth as directed.      Historical Provider, MD  citalopram (CELEXA) 20 MG tablet Take 10 mg by mouth daily.     Historical Provider, MD  donepezil (ARICEPT) 5 MG tablet Take 5 mg by mouth at bedtime.    Historical Provider, MD  HYDROCODONE-CHLORPHENIRAMINE PO Take 1.23 mLs by mouth at bedtime as needed (cough and cold symptoms).     Historical Provider, MD  HYDROcodone-homatropine (HYCODAN) 5-1.5 MG/5ML syrup Take 2.5 mLs by mouth as needed.    Historical Provider, MD  levETIRAcetam (KEPPRA) 250 MG tablet Take 62.5 mg by mouth as needed.  12/20/10   Historical Provider, MD  levothyroxine (SYNTHROID, LEVOTHROID) 50 MCG tablet Take 50 mcg by mouth daily before breakfast.    Historical Provider, MD  loratadine (CLARITIN) 10 MG tablet Take 10 mg by mouth as needed.    Historical Provider, MD  memantine [facility])  10 MG tablet Take 5 mg by mouth 2 (two) times daily.     Historical Provider, MD  NITROSTAT 0.4 MG SL tablet Place 0.4 mg under the tongue every 5 (five) minutes as needed.  02/09/11   Historical Provider, MD  omeprazole (PRILOSEC) 20 MG capsule Take 20 mg by mouth 2 (two) times daily.     Historical Provider, MD  pravastatin (PRAVACHOL) 20 MG tablet Take 20 mg by mouth daily.      Historical Provider, MD  salsalate (DISALCID) 750 MG tablet Take 750 mg by mouth 3 (three) times daily.     Historical Provider, MD  Sennosides-Docusate Sodium (SENNA-S PO) Take 1 tablet by mouth 2 (two) times daily.     Historical Provider, MD  warfarin (COUMADIN) 5 MG tablet Take as directed by coumadin clinic 05/11/14   Thompson Grayer, MD   BP 97/54 mmHg  Pulse 65  Temp(Src) 97.5 F (36.4 C) (Oral)  Resp 16  Ht  5' (1.524 m)  Wt 140 lb (63.504 kg)  BMI 27.34 kg/m2  SpO2 92% Physical Exam  Constitutional: She is oriented to person, place, and time. She appears well-developed and well-nourished. No distress.  HENT:  Head: Normocephalic and atraumatic.  Nose: Nose normal.  Mouth/Throat: Oropharynx is clear and moist.  Eyes: Conjunctivae and EOM are normal. Pupils are equal, round, and reactive to light.  Neck: Normal range of motion. Neck supple. No JVD present. No tracheal deviation present. No thyromegaly present.  Cardiovascular: Normal rate, regular rhythm and intact distal pulses.  Exam reveals no gallop and no friction rub.   Murmur heard. Pulmonary/Chest: Effort normal and breath sounds normal. No stridor. No respiratory distress. She has no wheezes. She has no rales. She exhibits no tenderness.  Lungs are clear, however, with sitting.  Patient up.  She dropped her oxygen levels from 97-90%  Abdominal: Soft. Bowel sounds are normal. She exhibits no distension and no mass. There is no tenderness. There is no rebound and no guarding.  Musculoskeletal: Normal range of motion. She exhibits edema and tenderness.  Patient has asymmetric edema of left lower leg greater than right lower leg with mild erythema.  No warmth, tenderness to posterior calf.  Lymphadenopathy:    She has no cervical adenopathy.  Neurological: She is alert and oriented to person, place, and time. She displays normal reflexes. She exhibits normal muscle tone. Coordination normal.  Skin: Skin is warm and dry. No rash noted. No erythema. No pallor.  Psychiatric: She has a normal mood and affect. Her behavior is normal. Judgment and thought content normal.  Nursing note and vitals reviewed.   ED Course  Procedures (including critical care time) Labs Review Labs Reviewed  BASIC METABOLIC PANEL - Abnormal; Notable for the following:    Glucose, Bld 109 (*)    BUN 24 (*)    GFR calc non Af Amer 55 (*)    GFR calc Af Amer 64  (*)    All other components within normal limits  PROTIME-INR - Abnormal; Notable for the following:    Prothrombin Time 22.7 (*)    INR 1.98 (*)    All other components within normal limits  CBC WITH DIFFERENTIAL/PLATELET    Imaging Review Ct Angio Chest Pe W/cm &/or Wo Cm  08/02/2014   CLINICAL DATA:  Decreased oxygen saturations and lower extremity swelling. History of pulmonary embolism.  EXAM: CT ANGIOGRAPHY CHEST WITH CONTRAST  TECHNIQUE: Multidetector CT imaging of the chest was performed using the  standard protocol during bolus administration of intravenous contrast. Multiplanar CT image reconstructions and MIPs were obtained to evaluate the vascular anatomy.  CONTRAST:  157mL OMNIPAQUE IOHEXOL 350 MG/ML SOLN  COMPARISON:  08/24/2009, 01/19/2011  FINDINGS: Cardiovascular: There is good opacification of the pulmonary arteries. There is no pulmonary embolism. The thoracic aorta is normal in caliber and intact.  Lungs: There are mild dependent posterior base opacities which are probably atelectatic. Lungs are otherwise clear.  Central airways: Patent  Effusions: None  Lymphadenopathy: None  Esophagus: Unremarkable  Upper abdomen: No significant abnormality. There is a sub cm hypodensity in the right hepatic lobe which is unchanged from 08/24/2009, benign.  Musculoskeletal: No significant abnormality  Review of the MIP images confirms the above findings.  IMPRESSION: Negative for pulmonary embolism.  No acute findings are evident.   Electronically Signed   By: Andreas Newport M.D.   On: 08/02/2014 01:45     EKG Interpretation   Date/Time:  Monday August 02 2014 00:21:56 EDT Ventricular Rate:  64 PR Interval:  185 QRS Duration: 146 QT Interval:  483 QTC Calculation: 498 R Axis:   -64 Text Interpretation:  Atrial-sensed ventricular-paced rhythm No further  analysis attempted due to paced rhythm Confirmed by Sherald Balbuena  MD, Reign Dziuba  ([id number]) on 08/02/2014 1:33:48 AM      MDM   Final  diagnoses:  Pain and swelling of left lower leg    81 year old female with left lower leg pain and swelling, history of DVT, also noted to have incidental low oxygen saturations.  Concern for PE DVT.  Plan for labs and CT angio chest.  2:26 AM CT anterior chest does not show any signs of pulmonary embolus.  Patient has been ambulated without descent.  It is unclear what was causing her low oxygen saturations before.  Will have her start incentive spirometry as there was some atelectasis on her CT scan.  We'll schedule her for outpatient Doppler study.  As she is still slightly subtherapeutic on her INR.  We'll give her additional Lovenox.  Linton Flemings, MD 08/02/14 4742  Linton Flemings, MD 08/02/14 [id number]

## 2014-08-03 ENCOUNTER — Ambulatory Visit (HOSPITAL_COMMUNITY)
Admission: RE | Admit: 2014-08-03 | Discharge: 2014-08-03 | Disposition: A | Discharge status: Home / Self Care | Payer: Medicare Other | Source: Ambulatory Visit | Attending: Emergency Medicine | Admitting: Emergency Medicine

## 2014-08-03 DIAGNOSIS — R609 Edema, unspecified: Secondary | ICD-10-CM | POA: Insufficient documentation

## 2014-08-03 DIAGNOSIS — Z7901 Long term (current) use of anticoagulants: Secondary | ICD-10-CM

## 2014-08-03 NOTE — Progress Notes (Signed)
VASCULAR LAB PRELIMINARY  PRELIMINARY  PRELIMINARY  PRELIMINARY  Left lower extremity venous duplex completed.    Preliminary report:  Left:  No evidence of DVT, superficial thrombosis, or Baker's cyst. No change since study of 2014  Kaiah Hosea, RVS 08/03/2014, 9:27 AM

## 2014-08-04 DIAGNOSIS — R262 Difficulty in walking, not elsewhere classified: Secondary | ICD-10-CM | POA: Diagnosis not present

## 2014-08-05 ENCOUNTER — Ambulatory Visit (INDEPENDENT_AMBULATORY_CARE_PROVIDER_SITE_OTHER): Payer: Medicare Other | Admitting: Interventional Cardiology

## 2014-08-05 DIAGNOSIS — R262 Difficulty in walking, not elsewhere classified: Secondary | ICD-10-CM | POA: Diagnosis not present

## 2014-08-05 DIAGNOSIS — Z5181 Encounter for therapeutic drug level monitoring: Secondary | ICD-10-CM

## 2014-08-05 DIAGNOSIS — I4891 Unspecified atrial fibrillation: Secondary | ICD-10-CM

## 2014-08-05 DIAGNOSIS — Z7901 Long term (current) use of anticoagulants: Secondary | ICD-10-CM | POA: Diagnosis not present

## 2014-08-05 LAB — PROTIME-INR: INR: 2.3 — AB (ref 0.9–1.1)

## 2014-08-06 ENCOUNTER — Ambulatory Visit (INDEPENDENT_AMBULATORY_CARE_PROVIDER_SITE_OTHER): Payer: Medicare Other | Admitting: Nurse Practitioner

## 2014-08-06 ENCOUNTER — Encounter: Payer: Self-pay | Admitting: Nurse Practitioner

## 2014-08-06 VITALS — BP 122/60 | HR 58 | Ht 60.0 in | Wt 139.1 lb

## 2014-08-06 DIAGNOSIS — I4891 Unspecified atrial fibrillation: Secondary | ICD-10-CM | POA: Diagnosis not present

## 2014-08-06 DIAGNOSIS — Z950 Presence of cardiac pacemaker: Secondary | ICD-10-CM

## 2014-08-06 DIAGNOSIS — Z5181 Encounter for therapeutic drug level monitoring: Secondary | ICD-10-CM

## 2014-08-06 NOTE — Patient Instructions (Signed)
Stay on your current medicines  See Dr. Rayann Heman in 6 months  Call the [place] office at [phone] if you have any questions, problems or concerns.

## 2014-08-06 NOTE — Progress Notes (Signed)
CARDIOLOGY OFFICE NOTE  Date:  08/06/2014    [name] Date of Birth: [birth year] Medical Record #[id number]  PCP:  [name], MD  Cardiologist:  Allred    Chief Complaint  Patient presents with  . Atrial Fibrillation    Seen for Dr. Rayann Heman.     History of Present Illness: ELLEANNA Watts is a 81 y.o. female who presents today for a follow up visit. She is seen for Dr. Rayann Heman. She has prior PE/DVT and is on chronic coumadin. She has a pacemaker in place. She did have some type of undefined neurologic disorder several years ago which resolved and was never fully understood.   I last saw her in 2013. She has followed with Dr. Rayann Heman since then. Has had some issues with postural dizziness.   Last seen here back in July - continuing to have chronic issues with lightheadedness and unsteadiness. She was not orthostatic by BP or HR at that visit but clearly had her symptoms with standing. Dr. Rayann Heman suspected neurologic cause and she was to continue to follow with Dr Doy Mince.Echo was updated.   She comes in today. She is here with her husband. She is doing well. She was in the ER just this past Nicole night - was having some pain in her left leg. Her INR was subtherapeutic - she got a CT scan - no PE - had a venous duplex earlier this week - negative for DVT. She got a dose of Lovenox and INR yesterday was therapeutic. Some intermittent burning over her left foot. No falls. No chest pain. Breathing ok. They will be married 60 years this August.   Past Medical History  Diagnosis Date  . Arthritis   . Depression   . Hyperlipidemia   . Hypothyroidism   . HB (heart block)     s/p PPM, most recent generator change 12/11 by JA  . Dementia   . Hiatal hernia   . PTE (post-transplant erythrocytosis)   . Atrial fibrillation   . Pulmonary embolism 2011    on coumadin  . DVT (deep venous thrombosis) 2011  . UTI (lower urinary tract infection)     Past Surgical  History  Procedure Laterality Date  . Hemorrhoid surgery    . Pacemaker      most recent generator 12/11 by JA  . Hysterectomy (other)    . Cholecystectomy  09/2010  . Total knee arthroplasty Left   . Joint replacement       Medications: Current Outpatient Prescriptions  Medication Sig Dispense Refill  . Calcium Carbonate (CALCIUM-CARB 600 PO) Take 1 capsule by mouth daily.     . cholecalciferol (VITAMIN D) 1000 UNITS tablet Take 1,000 Units by mouth daily.     . citalopram (CELEXA) 20 MG tablet Take 10 mg by mouth daily.     [name] donepezil (ARICEPT) 5 MG tablet Take 5 mg by mouth at bedtime.    [name] HYDROCODONE-CHLORPHENIRAMINE PO Take 1.23 mLs by mouth at bedtime as needed (cough and cold symptoms).     [name] HYDROcodone-homatropine (HYCODAN) 5-1.5 MG/5ML syrup Take 2.5 mLs by mouth as needed for cough.     . levETIRAcetam (KEPPRA) 250 MG tablet Take 62.5 mg by mouth daily.     [name] levothyroxine (SYNTHROID, LEVOTHROID) 50 MCG tablet Take 50 mcg by mouth daily before breakfast.    . loratadine (CLARITIN) 10 MG tablet Take 10 mg by mouth as needed.    . meloxicam (MOBIC) 7.5 MG  tablet Take 7.5 mg by mouth 2 (two) times daily.    . memantine (NAMENDA) 10 MG tablet Take 5 mg by mouth 2 (two) times daily.     [name] NITROSTAT 0.4 MG SL tablet Place 0.4 mg under the tongue every 5 (five) minutes as needed.     [name] omeprazole (PRILOSEC) 20 MG capsule Take 20 mg by mouth 2 (two) times daily.     . pravastatin (PRAVACHOL) 20 MG tablet Take 20 mg by mouth daily.      . Probiotic Product (PROBIOTIC DAILY PO) Take 1 capsule by mouth daily.    Orlie Dakin Sodium (SENNA-S PO) Take 1 tablet by mouth 2 (two) times daily.     [name] warfarin (COUMADIN) 5 MG tablet Take as directed by coumadin clinic (Patient taking differently: Take 2.5-5 mg by mouth daily. Take 1 tablet every day except Thursdays take 0.5 tablet) 120 tablet 0   No current facility-administered medications for this visit.    Allergies: No  Known Allergies  Social History: The patient  reports that she has never smoked. She does not have any smokeless tobacco history on file. She reports that she does not drink alcohol or use illicit drugs.   Family History: The patient's family history includes Arthritis in her father and mother; Breast cancer in her sister; Heart disease in her father and mother; Liver cancer in her brother; Stomach cancer in her brother and brother; Stroke in her father.   Review of Systems: Please see the history of present illness.   Otherwise, the review of systems is positive for dizziness.   All other systems are reviewed and negative.   Physical Exam: VS:  BP 122/60 mmHg  Pulse 58  Ht 5' (1.524 m)  Wt 139 lb 1.9 oz (63.104 kg)  BMI 27.17 kg/m2 .  BMI Body mass index is 27.17 kg/(m^2).  Wt Readings from Last 3 Encounters:  08/06/14 139 lb 1.9 oz (63.104 kg)  08/01/14 140 lb (63.504 kg)  11/18/13 140 lb (63.504 kg)    General: Pleasant. Well developed, well nourished and in no acute distress.  HEENT: Normal. Neck: Supple, no JVD, carotid bruits, or masses noted.  Cardiac: Regular rate and rhythm. No murmurs, rubs, or gallops. No edema.  Respiratory:  Lungs are clear to auscultation bilaterally with normal work of breathing.  GI: Soft and nontender.  MS: No deformity or atrophy. Gait and ROM intact. Skin: Warm and dry. Color is normal.  Neuro:  Strength and sensation are intact and no gross focal deficits noted.  Psych: Alert, appropriate and with normal affect.   LABORATORY DATA:  EKG:  EKG is not ordered today.   Lab Results  Component Value Date   WBC 6.1 08/01/2014   HGB 12.3 08/01/2014   HCT 37.8 08/01/2014   PLT 177 08/01/2014   GLUCOSE 109* 08/01/2014   CHOL  08/25/2009    141        ATP III CLASSIFICATION:  <200     mg/dL   Desirable  200-239  mg/dL   Borderline High  >=240    mg/dL   High          TRIG 90 08/25/2009   HDL 40 08/25/2009   LDLCALC  08/25/2009    83         Total Cholesterol/HDL:CHD Risk Coronary Heart Disease Risk Table                     Men  Women  1/2 Average Risk   3.4   3.3  Average Risk       5.0   4.4  2 X Average Risk   9.6   7.1  3 X Average Risk  23.4   11.0        Use the calculated Patient Ratio above and the CHD Risk Table to determine the patient's CHD Risk.        ATP III CLASSIFICATION (LDL):  <100     mg/dL   Optimal  100-129  mg/dL   Near or Above                    Optimal  130-159  mg/dL   Borderline  160-189  mg/dL   High  >190     mg/dL   Very High   ALT 10 11/01/2013   AST 17 11/01/2013   NA 139 08/01/2014   K 4.1 08/01/2014   CL 105 08/01/2014   CREATININE 0.90 08/01/2014   BUN 24* 08/01/2014   CO2 26 08/01/2014   TSH 0.43 02/12/2011   INR 2.3* 08/05/2014    BNP (last 3 results) No results for input(s): BNP in the last 8760 hours.  ProBNP (last 3 results) No results for input(s): PROBNP in the last 8760 hours.   Other Studies Reviewed Today:  Echo Study Conclusions from July 2015  - Left ventricle: The cavity size was normal. There was mild concentric hypertrophy. Systolic function was normal. The estimated ejection fraction was in the range of 60% to 65%. Wall motion was normal; there were no regional wall motion abnormalities. Doppler parameters are consistent with abnormal left ventricular relaxation (grade 1 diastolic dysfunction). Doppler parameters are consistent with elevated mean left atrial filling pressure. - Aortic valve: There was mild stenosis. There was trivial regurgitation. - Mitral valve: Calcified annulus. - Pulmonary arteries: Systolic pressure was mildly increased. PA peak pressure: 43 mm Hg (S).  Assessment/Plan: 1. PAF - on coumadin.   2. PTVP - She will see Dr. Rayann Heman with a pacer check in 6 months.  3. Leg pain - probable neuropathy  4. Chronic coumadin - no problems noted.  5. Advanced age  Seems to be holding her own. No change  with her current regimen. I will be happy to see back as needed.   Current medicines are reviewed with the patient today.  The patient does not have concerns regarding medicines other than what has been noted above.  The following changes have been made:  See above.  Labs/ tests ordered today include:   No orders of the defined types were placed in this encounter.     Disposition:   FU with Dr. Rayann Heman in 6 months.   Patient is agreeable to this plan and will call if any problems develop in the interim.   Signed: Burtis Junes, RN, ANP-C 08/06/2014 3:14 PM  [facility] [address] [place], [place]  [address] Phone: [phone] Fax: [phone]

## 2014-08-12 DIAGNOSIS — M542 Cervicalgia: Secondary | ICD-10-CM | POA: Diagnosis not present

## 2014-08-12 DIAGNOSIS — M546 Pain in thoracic spine: Secondary | ICD-10-CM | POA: Diagnosis not present

## 2014-08-12 DIAGNOSIS — M5032 Other cervical disc degeneration, mid-cervical region: Secondary | ICD-10-CM | POA: Diagnosis not present

## 2014-08-12 DIAGNOSIS — M5134 Other intervertebral disc degeneration, thoracic region: Secondary | ICD-10-CM | POA: Diagnosis not present

## 2014-08-12 DIAGNOSIS — M8588 Other specified disorders of bone density and structure, other site: Secondary | ICD-10-CM | POA: Diagnosis not present

## 2014-08-19 DIAGNOSIS — M810 Age-related osteoporosis without current pathological fracture: Secondary | ICD-10-CM | POA: Diagnosis not present

## 2014-08-19 DIAGNOSIS — E039 Hypothyroidism, unspecified: Secondary | ICD-10-CM | POA: Diagnosis not present

## 2014-08-19 DIAGNOSIS — E785 Hyperlipidemia, unspecified: Secondary | ICD-10-CM | POA: Diagnosis not present

## 2014-08-26 ENCOUNTER — Telehealth: Payer: Self-pay | Admitting: Cardiology

## 2014-08-26 ENCOUNTER — Encounter: Payer: Medicare Other | Admitting: *Deleted

## 2014-08-26 ENCOUNTER — Ambulatory Visit (INDEPENDENT_AMBULATORY_CARE_PROVIDER_SITE_OTHER): Payer: Medicare Other | Admitting: Cardiovascular Disease

## 2014-08-26 DIAGNOSIS — I4891 Unspecified atrial fibrillation: Secondary | ICD-10-CM

## 2014-08-26 DIAGNOSIS — I509 Heart failure, unspecified: Secondary | ICD-10-CM | POA: Diagnosis not present

## 2014-08-26 DIAGNOSIS — E039 Hypothyroidism, unspecified: Secondary | ICD-10-CM | POA: Diagnosis not present

## 2014-08-26 DIAGNOSIS — E785 Hyperlipidemia, unspecified: Secondary | ICD-10-CM | POA: Diagnosis not present

## 2014-08-26 DIAGNOSIS — M810 Age-related osteoporosis without current pathological fracture: Secondary | ICD-10-CM | POA: Diagnosis not present

## 2014-08-26 DIAGNOSIS — Z5181 Encounter for therapeutic drug level monitoring: Secondary | ICD-10-CM

## 2014-08-26 DIAGNOSIS — G609 Hereditary and idiopathic neuropathy, unspecified: Secondary | ICD-10-CM | POA: Diagnosis not present

## 2014-08-26 LAB — POCT INR: INR: 2.5

## 2014-08-26 NOTE — Telephone Encounter (Signed)
LMOVM reminding pt to send remote transmission.   

## 2014-08-27 ENCOUNTER — Encounter: Payer: Self-pay | Admitting: Cardiology

## 2014-09-01 ENCOUNTER — Encounter: Payer: Self-pay | Admitting: Internal Medicine

## 2014-09-01 ENCOUNTER — Ambulatory Visit (INDEPENDENT_AMBULATORY_CARE_PROVIDER_SITE_OTHER): Payer: Medicare Other | Admitting: *Deleted

## 2014-09-01 DIAGNOSIS — I442 Atrioventricular block, complete: Secondary | ICD-10-CM

## 2014-09-01 NOTE — Progress Notes (Signed)
Remote pacemaker transmission.   

## 2014-09-05 LAB — MDC_IDC_ENUM_SESS_TYPE_REMOTE
Battery Impedance: 247 Ohm
Battery Remaining Longevity: 105 mo
Battery Voltage: 2.79 V
Brady Statistic AP VP Percent: 13 %
Brady Statistic AP VS Percent: 0 %
Brady Statistic AS VP Percent: 87 %
Brady Statistic AS VS Percent: 0 %
Date Time Interrogation Session: 20160420132544
Lead Channel Impedance Value: 613 Ohm
Lead Channel Impedance Value: 878 Ohm
Lead Channel Pacing Threshold Amplitude: 1.5 V
Lead Channel Pacing Threshold Amplitude: 1.625 V
Lead Channel Pacing Threshold Pulse Width: 0.4 ms
Lead Channel Pacing Threshold Pulse Width: 0.4 ms
Lead Channel Setting Pacing Amplitude: 3 V
Lead Channel Setting Pacing Amplitude: 3.25 V
Lead Channel Setting Pacing Pulse Width: 0.4 ms
Lead Channel Setting Sensing Sensitivity: 2.8 mV

## 2014-09-06 ENCOUNTER — Other Ambulatory Visit: Payer: Self-pay | Admitting: *Deleted

## 2014-09-06 MED ORDER — WARFARIN SODIUM 5 MG PO TABS: ORAL_TABLET | ORAL | Status: DC

## 2014-09-14 ENCOUNTER — Encounter: Payer: Self-pay | Admitting: Cardiology

## 2014-09-23 ENCOUNTER — Ambulatory Visit (INDEPENDENT_AMBULATORY_CARE_PROVIDER_SITE_OTHER): Payer: Medicare Other | Admitting: Internal Medicine

## 2014-09-23 DIAGNOSIS — I4891 Unspecified atrial fibrillation: Secondary | ICD-10-CM | POA: Diagnosis not present

## 2014-09-23 DIAGNOSIS — Z5181 Encounter for therapeutic drug level monitoring: Secondary | ICD-10-CM

## 2014-09-23 DIAGNOSIS — Z7901 Long term (current) use of anticoagulants: Secondary | ICD-10-CM | POA: Diagnosis not present

## 2014-09-23 LAB — PROTIME-INR: INR: 1.9 — AB (ref 0.9–1.1)

## 2014-10-05 DIAGNOSIS — L821 Other seborrheic keratosis: Secondary | ICD-10-CM | POA: Diagnosis not present

## 2014-10-05 DIAGNOSIS — D225 Melanocytic nevi of trunk: Secondary | ICD-10-CM | POA: Diagnosis not present

## 2014-10-05 DIAGNOSIS — D1801 Hemangioma of skin and subcutaneous tissue: Secondary | ICD-10-CM | POA: Diagnosis not present

## 2014-10-07 ENCOUNTER — Ambulatory Visit (INDEPENDENT_AMBULATORY_CARE_PROVIDER_SITE_OTHER): Payer: Medicare Other | Admitting: Cardiology

## 2014-10-07 DIAGNOSIS — I4891 Unspecified atrial fibrillation: Secondary | ICD-10-CM

## 2014-10-07 DIAGNOSIS — Z5181 Encounter for therapeutic drug level monitoring: Secondary | ICD-10-CM

## 2014-10-07 DIAGNOSIS — Z7901 Long term (current) use of anticoagulants: Secondary | ICD-10-CM | POA: Diagnosis not present

## 2014-10-07 LAB — PROTIME-INR: INR: 2.2 — AB (ref 0.9–1.1)

## 2014-11-04 ENCOUNTER — Ambulatory Visit (INDEPENDENT_AMBULATORY_CARE_PROVIDER_SITE_OTHER): Payer: Medicare Other | Admitting: Internal Medicine

## 2014-11-04 DIAGNOSIS — I4891 Unspecified atrial fibrillation: Secondary | ICD-10-CM | POA: Diagnosis not present

## 2014-11-04 DIAGNOSIS — Z7901 Long term (current) use of anticoagulants: Secondary | ICD-10-CM | POA: Diagnosis not present

## 2014-11-04 DIAGNOSIS — Z5181 Encounter for therapeutic drug level monitoring: Secondary | ICD-10-CM

## 2014-11-04 LAB — PROTIME-INR: INR: 2.3 — AB (ref 0.9–1.1)

## 2014-11-09 DIAGNOSIS — M216X1 Other acquired deformities of right foot: Secondary | ICD-10-CM | POA: Diagnosis not present

## 2014-11-09 DIAGNOSIS — M216X2 Other acquired deformities of left foot: Secondary | ICD-10-CM | POA: Diagnosis not present

## 2014-11-09 DIAGNOSIS — M7542 Impingement syndrome of left shoulder: Secondary | ICD-10-CM | POA: Diagnosis not present

## 2014-11-09 DIAGNOSIS — I739 Peripheral vascular disease, unspecified: Secondary | ICD-10-CM | POA: Diagnosis not present

## 2014-11-09 DIAGNOSIS — L84 Corns and callosities: Secondary | ICD-10-CM | POA: Diagnosis not present

## 2014-11-09 DIAGNOSIS — M25512 Pain in left shoulder: Secondary | ICD-10-CM | POA: Diagnosis not present

## 2014-11-09 DIAGNOSIS — M7750 Other enthesopathy of unspecified foot: Secondary | ICD-10-CM | POA: Diagnosis not present

## 2014-11-09 DIAGNOSIS — M79672 Pain in left foot: Secondary | ICD-10-CM | POA: Diagnosis not present

## 2014-11-09 DIAGNOSIS — G5762 Lesion of plantar nerve, left lower limb: Secondary | ICD-10-CM | POA: Diagnosis not present

## 2014-11-09 DIAGNOSIS — L603 Nail dystrophy: Secondary | ICD-10-CM | POA: Diagnosis not present

## 2014-11-16 DIAGNOSIS — M7752 Other enthesopathy of left foot: Secondary | ICD-10-CM | POA: Diagnosis not present

## 2014-11-18 ENCOUNTER — Encounter: Payer: Self-pay | Admitting: Internal Medicine

## 2014-11-18 ENCOUNTER — Ambulatory Visit (INDEPENDENT_AMBULATORY_CARE_PROVIDER_SITE_OTHER): Payer: Medicare Other | Admitting: Internal Medicine

## 2014-11-18 VITALS — BP 124/60 | HR 83 | Ht 60.0 in | Wt 140.6 lb

## 2014-11-18 DIAGNOSIS — I4891 Unspecified atrial fibrillation: Secondary | ICD-10-CM | POA: Diagnosis not present

## 2014-11-18 DIAGNOSIS — I442 Atrioventricular block, complete: Secondary | ICD-10-CM

## 2014-11-18 LAB — CUP PACEART INCLINIC DEVICE CHECK
Battery Impedance: 271 Ohm
Battery Remaining Longevity: 105 mo
Battery Voltage: 2.79 V
Brady Statistic AP VP Percent: 12 %
Brady Statistic AP VS Percent: 0 %
Brady Statistic AS VP Percent: 88 %
Brady Statistic AS VS Percent: 0 %
Date Time Interrogation Session: 20160707144500
Lead Channel Impedance Value: 633 Ohm
Lead Channel Impedance Value: 936 Ohm
Lead Channel Pacing Threshold Amplitude: 1.25 V
Lead Channel Pacing Threshold Amplitude: 1.5 V
Lead Channel Pacing Threshold Pulse Width: 0.4 ms
Lead Channel Pacing Threshold Pulse Width: 0.4 ms
Lead Channel Sensing Intrinsic Amplitude: 1 mV
Lead Channel Sensing Intrinsic Amplitude: 11.2 mV
Lead Channel Setting Pacing Amplitude: 3 V
Lead Channel Setting Pacing Amplitude: 3 V
Lead Channel Setting Pacing Pulse Width: 0.4 ms
Lead Channel Setting Sensing Sensitivity: 5.6 mV

## 2014-11-18 NOTE — Patient Instructions (Signed)
Medication Instructions: - Avoid meloxicam  Labwork: - none   Procedures/Testing: - none  Follow-Up: - Remote monitoring is used to monitor your Pacemaker of ICD from home. This monitoring reduces the number of office visits required to check your device to one time per year. It allows [place] to keep an eye on the functioning of your device to ensure it is working properly. You are scheduled for a device check from home on 02/17/15. You may send your transmission at any time that day. If you have a wireless device, the transmission will be sent automatically. After your physician reviews your transmission, you will receive a postcard with your next transmission date.  - Your physician wants you to follow-up in: 1 year with Dr. Rayann Heman. You will receive a reminder letter in the mail two months in advance. If you don't receive a letter, please call our office to schedule the follow-up appointment.  Any Additional Special Instructions Will Be Listed Below (If Applicable).

## 2014-11-21 NOTE — Progress Notes (Signed)
PCP: [name], MD  [name] is a 81 y.o. female who presents today for routine electrophysiology followup.  Since last being seen in our clinic, the patient reports doing reasonably well.  She continues to have chronic difficulty with dizziness and unsteadiness.  This is primarily postural in nature.  Today, she denies symptoms of palpitations, exertional chest pain, shortness of breath,  lower extremity edema, or syncope.  The patient is otherwise without complaint today.   Past Medical History  Diagnosis Date  . Arthritis   . Depression   . Hyperlipidemia   . Hypothyroidism   . HB (heart block)     s/p PPM, most recent generator change 12/11 by JA  . Dementia   . Hiatal hernia   . PTE (post-transplant erythrocytosis)   . Atrial fibrillation   . Pulmonary embolism 2011    on coumadin  . DVT (deep venous thrombosis) 2011  . UTI (lower urinary tract infection)    Past Surgical History  Procedure Laterality Date  . Hemorrhoid surgery    . Pacemaker      most recent generator 12/11 by JA  . Hysterectomy (other)    . Cholecystectomy  09/2010  . Total knee arthroplasty Left   . Joint replacement      Current Outpatient Prescriptions  Medication Sig Dispense Refill  . Calcium Carbonate (CALCIUM-CARB 600 PO) Take 1 capsule by mouth daily.     . cholecalciferol (VITAMIN D) 1000 UNITS tablet Take 1,000 Units by mouth daily.     . citalopram (CELEXA) 20 MG tablet Take 20 mg by mouth 2 (two) times daily.     [name] donepezil (ARICEPT) 5 MG tablet Take 5 mg by mouth at bedtime.    [name] HYDROCODONE-CHLORPHENIRAMINE PO Take 1.23 mLs by mouth at bedtime as needed (cough and cold symptoms).     [name] HYDROcodone-homatropine (HYCODAN) 5-1.5 MG/5ML syrup Take 2.5 mLs by mouth daily as needed for cough.     . levETIRAcetam (KEPPRA) 250 MG tablet Take 250 mg by mouth daily.     [name] levothyroxine (SYNTHROID, LEVOTHROID) 50 MCG tablet Take 50 mcg by mouth daily before breakfast.    . loratadine  (CLARITIN) 10 MG tablet Take 10 mg by mouth daily as needed for allergies or rhinitis.     . meloxicam (MOBIC) 7.5 MG tablet Take 7.5 mg by mouth 2 (two) times daily.    . memantine (NAMENDA) 10 MG tablet Take 5 mg by mouth 2 (two) times daily.     [name] NITROSTAT 0.4 MG SL tablet Place 0.4 mg under the tongue every 5 (five) minutes as needed for chest pain (MAX 3 TABLETS).     [name] omeprazole (PRILOSEC) 20 MG capsule Take 20 mg by mouth 2 (two) times daily.     . pravastatin (PRAVACHOL) 20 MG tablet Take 20 mg by mouth daily.      . Probiotic Product (PROBIOTIC DAILY PO) Take 1 capsule by mouth daily.    Orlie Dakin Sodium (SENNA-S PO) Take 1 tablet by mouth 2 (two) times daily.     [name] warfarin (COUMADIN) 5 MG tablet Take as directed by coumadin clinic 120 tablet 0   No current facility-administered medications for this visit.   ROS- all systems are reviewed at length today.    Physical Exam: Filed Vitals:   11/18/14 1245  BP: 124/60  Pulse: 83  Height: 5' (1.524 m)  Weight: 63.776 kg (140 lb 9.6 oz)    GEN- The patient is  elderly appearing, alert and oriented x 3 today.   Head- normocephalic, atraumatic Eyes-  Sclera clear, conjunctiva pink Ears- hearing intact Oropharynx- clear Lungs- Clear to ausculation bilaterally, normal work of breathing Chest- pacemaker pocket is well healed Heart- Regular rate and rhythm, 2/6 SEM LSB GI- soft, NT, ND, + BS Extremities- no clubbing, cyanosis, or edema  Pacemaker interrogation- reviewed in detail today,  See PACEART report  Assessment and Plan:  1. Complete heart block Normal pacemaker function See Pace Art report No changes today  2. Afib/ h/o PTE Continue long term anticoagulation with coumadin Avoidance of NSAIDS was advised today  3. Postural dizziness She has chronic issues with lightheadedness and unsteadiness. No further CV workup is planned Adequate hydration is encouraged  Carelink I will see in a year She does  not wish to see NP in 6 months but would like to contact our office if problems arise.

## 2014-11-25 ENCOUNTER — Ambulatory Visit (INDEPENDENT_AMBULATORY_CARE_PROVIDER_SITE_OTHER): Payer: Medicare Other | Admitting: Pharmacist

## 2014-11-25 DIAGNOSIS — Z5181 Encounter for therapeutic drug level monitoring: Secondary | ICD-10-CM | POA: Diagnosis not present

## 2014-11-25 DIAGNOSIS — I4891 Unspecified atrial fibrillation: Secondary | ICD-10-CM

## 2014-11-25 DIAGNOSIS — Z7901 Long term (current) use of anticoagulants: Secondary | ICD-10-CM | POA: Diagnosis not present

## 2014-11-25 LAB — PROTIME-INR: INR: 2.5 — AB (ref 0.9–1.1)

## 2014-12-13 DIAGNOSIS — R260 Ataxic gait: Secondary | ICD-10-CM | POA: Diagnosis not present

## 2014-12-13 DIAGNOSIS — T169XXA Foreign body in ear, unspecified ear, initial encounter: Secondary | ICD-10-CM | POA: Diagnosis not present

## 2014-12-13 DIAGNOSIS — R05 Cough: Secondary | ICD-10-CM | POA: Diagnosis not present

## 2014-12-13 DIAGNOSIS — R293 Abnormal posture: Secondary | ICD-10-CM | POA: Diagnosis not present

## 2014-12-15 DIAGNOSIS — R260 Ataxic gait: Secondary | ICD-10-CM | POA: Diagnosis not present

## 2014-12-15 DIAGNOSIS — R293 Abnormal posture: Secondary | ICD-10-CM | POA: Diagnosis not present

## 2014-12-16 DIAGNOSIS — R260 Ataxic gait: Secondary | ICD-10-CM | POA: Diagnosis not present

## 2014-12-16 DIAGNOSIS — R293 Abnormal posture: Secondary | ICD-10-CM | POA: Diagnosis not present

## 2014-12-17 DIAGNOSIS — H02831 Dermatochalasis of right upper eyelid: Secondary | ICD-10-CM | POA: Diagnosis not present

## 2014-12-17 DIAGNOSIS — H3531 Nonexudative age-related macular degeneration: Secondary | ICD-10-CM | POA: Diagnosis not present

## 2014-12-17 DIAGNOSIS — H02054 Trichiasis without entropian left upper eyelid: Secondary | ICD-10-CM | POA: Diagnosis not present

## 2014-12-17 DIAGNOSIS — H02051 Trichiasis without entropian right upper eyelid: Secondary | ICD-10-CM | POA: Diagnosis not present

## 2014-12-22 DIAGNOSIS — R260 Ataxic gait: Secondary | ICD-10-CM | POA: Diagnosis not present

## 2014-12-22 DIAGNOSIS — R293 Abnormal posture: Secondary | ICD-10-CM | POA: Diagnosis not present

## 2014-12-23 ENCOUNTER — Ambulatory Visit (INDEPENDENT_AMBULATORY_CARE_PROVIDER_SITE_OTHER): Payer: Medicare Other | Admitting: Cardiology

## 2014-12-23 DIAGNOSIS — I4891 Unspecified atrial fibrillation: Secondary | ICD-10-CM

## 2014-12-23 DIAGNOSIS — Z5181 Encounter for therapeutic drug level monitoring: Secondary | ICD-10-CM

## 2014-12-23 DIAGNOSIS — R260 Ataxic gait: Secondary | ICD-10-CM | POA: Diagnosis not present

## 2014-12-23 DIAGNOSIS — R293 Abnormal posture: Secondary | ICD-10-CM | POA: Diagnosis not present

## 2014-12-23 DIAGNOSIS — Z7901 Long term (current) use of anticoagulants: Secondary | ICD-10-CM | POA: Diagnosis not present

## 2014-12-23 LAB — PROTIME-INR: INR: 2 — AB (ref 0.9–1.1)

## 2015-01-04 DIAGNOSIS — G253 Myoclonus: Secondary | ICD-10-CM | POA: Diagnosis not present

## 2015-01-04 DIAGNOSIS — F419 Anxiety disorder, unspecified: Secondary | ICD-10-CM | POA: Diagnosis not present

## 2015-01-04 DIAGNOSIS — R2681 Unsteadiness on feet: Secondary | ICD-10-CM | POA: Diagnosis not present

## 2015-01-05 DIAGNOSIS — Z23 Encounter for immunization: Secondary | ICD-10-CM | POA: Diagnosis not present

## 2015-01-10 DIAGNOSIS — R293 Abnormal posture: Secondary | ICD-10-CM | POA: Diagnosis not present

## 2015-01-10 DIAGNOSIS — R260 Ataxic gait: Secondary | ICD-10-CM | POA: Diagnosis not present

## 2015-01-11 DIAGNOSIS — M722 Plantar fascial fibromatosis: Secondary | ICD-10-CM | POA: Diagnosis not present

## 2015-01-12 DIAGNOSIS — R293 Abnormal posture: Secondary | ICD-10-CM | POA: Diagnosis not present

## 2015-01-12 DIAGNOSIS — R260 Ataxic gait: Secondary | ICD-10-CM | POA: Diagnosis not present

## 2015-01-13 DIAGNOSIS — R260 Ataxic gait: Secondary | ICD-10-CM | POA: Diagnosis not present

## 2015-01-13 DIAGNOSIS — R293 Abnormal posture: Secondary | ICD-10-CM | POA: Diagnosis not present

## 2015-01-17 DIAGNOSIS — R293 Abnormal posture: Secondary | ICD-10-CM | POA: Diagnosis not present

## 2015-01-17 DIAGNOSIS — R260 Ataxic gait: Secondary | ICD-10-CM | POA: Diagnosis not present

## 2015-01-18 ENCOUNTER — Other Ambulatory Visit: Payer: Self-pay | Admitting: *Deleted

## 2015-01-18 MED ORDER — WARFARIN SODIUM 5 MG PO TABS: ORAL_TABLET | ORAL | Status: DC

## 2015-01-20 ENCOUNTER — Ambulatory Visit (INDEPENDENT_AMBULATORY_CARE_PROVIDER_SITE_OTHER): Payer: Medicare Other | Admitting: Cardiology

## 2015-01-20 DIAGNOSIS — Z5181 Encounter for therapeutic drug level monitoring: Secondary | ICD-10-CM

## 2015-01-20 DIAGNOSIS — I4891 Unspecified atrial fibrillation: Secondary | ICD-10-CM | POA: Diagnosis not present

## 2015-01-20 DIAGNOSIS — Z7901 Long term (current) use of anticoagulants: Secondary | ICD-10-CM | POA: Diagnosis not present

## 2015-01-20 LAB — PROTIME-INR: INR: 2.2 — AB (ref 0.9–1.1)

## 2015-01-25 DIAGNOSIS — R1030 Lower abdominal pain, unspecified: Secondary | ICD-10-CM | POA: Diagnosis not present

## 2015-01-25 DIAGNOSIS — N390 Urinary tract infection, site not specified: Secondary | ICD-10-CM | POA: Diagnosis not present

## 2015-01-25 DIAGNOSIS — M549 Dorsalgia, unspecified: Secondary | ICD-10-CM | POA: Diagnosis not present

## 2015-02-02 DIAGNOSIS — M545 Low back pain: Secondary | ICD-10-CM | POA: Diagnosis not present

## 2015-02-02 DIAGNOSIS — N390 Urinary tract infection, site not specified: Secondary | ICD-10-CM | POA: Diagnosis not present

## 2015-02-17 ENCOUNTER — Ambulatory Visit (INDEPENDENT_AMBULATORY_CARE_PROVIDER_SITE_OTHER): Payer: Medicare Other | Admitting: *Deleted

## 2015-02-17 ENCOUNTER — Telehealth: Payer: Self-pay | Admitting: Cardiology

## 2015-02-17 DIAGNOSIS — I442 Atrioventricular block, complete: Secondary | ICD-10-CM

## 2015-02-17 NOTE — Telephone Encounter (Signed)
Confirmed remote transmission w/ pt husband.   

## 2015-02-17 NOTE — Progress Notes (Signed)
Remote pacemaker transmission.   

## 2015-02-22 DIAGNOSIS — Z1389 Encounter for screening for other disorder: Secondary | ICD-10-CM | POA: Diagnosis not present

## 2015-02-22 DIAGNOSIS — Z0000 Encounter for general adult medical examination without abnormal findings: Secondary | ICD-10-CM | POA: Diagnosis not present

## 2015-02-22 DIAGNOSIS — E663 Overweight: Secondary | ICD-10-CM | POA: Diagnosis not present

## 2015-02-22 DIAGNOSIS — M810 Age-related osteoporosis without current pathological fracture: Secondary | ICD-10-CM | POA: Diagnosis not present

## 2015-02-22 DIAGNOSIS — E785 Hyperlipidemia, unspecified: Secondary | ICD-10-CM | POA: Diagnosis not present

## 2015-03-01 DIAGNOSIS — R55 Syncope and collapse: Secondary | ICD-10-CM | POA: Diagnosis not present

## 2015-03-01 DIAGNOSIS — I509 Heart failure, unspecified: Secondary | ICD-10-CM | POA: Diagnosis not present

## 2015-03-01 DIAGNOSIS — F339 Major depressive disorder, recurrent, unspecified: Secondary | ICD-10-CM | POA: Diagnosis not present

## 2015-03-01 DIAGNOSIS — I4891 Unspecified atrial fibrillation: Secondary | ICD-10-CM | POA: Diagnosis not present

## 2015-03-01 DIAGNOSIS — N183 Chronic kidney disease, stage 3 (moderate): Secondary | ICD-10-CM | POA: Diagnosis not present

## 2015-03-03 ENCOUNTER — Ambulatory Visit (INDEPENDENT_AMBULATORY_CARE_PROVIDER_SITE_OTHER): Payer: Medicare Other | Admitting: Cardiology

## 2015-03-03 ENCOUNTER — Other Ambulatory Visit (HOSPITAL_COMMUNITY): Payer: Self-pay | Admitting: Internal Medicine

## 2015-03-03 DIAGNOSIS — R55 Syncope and collapse: Secondary | ICD-10-CM

## 2015-03-03 DIAGNOSIS — I4891 Unspecified atrial fibrillation: Secondary | ICD-10-CM

## 2015-03-03 DIAGNOSIS — Z7901 Long term (current) use of anticoagulants: Secondary | ICD-10-CM | POA: Diagnosis not present

## 2015-03-03 DIAGNOSIS — Z5181 Encounter for therapeutic drug level monitoring: Secondary | ICD-10-CM

## 2015-03-03 LAB — PROTIME-INR: INR: 2.2 — AB (ref 0.9–1.1)

## 2015-03-07 LAB — CUP PACEART REMOTE DEVICE CHECK
Battery Impedance: 319 Ohm
Battery Remaining Longevity: 97 mo
Battery Voltage: 2.78 V
Brady Statistic AP VP Percent: 9 %
Brady Statistic AP VS Percent: 0 %
Brady Statistic AS VP Percent: 91 %
Brady Statistic AS VS Percent: 0 %
Date Time Interrogation Session: 20161006154956
Implantable Lead Implant Date: 19980518
Implantable Lead Implant Date: 20040301
Implantable Lead Location: 753859
Implantable Lead Location: 753860
Implantable Lead Model: 4269
Implantable Lead Model: 4285
Implantable Lead Serial Number: 249440
Implantable Lead Serial Number: 295540
Lead Channel Impedance Value: 613 Ohm
Lead Channel Impedance Value: 887 Ohm
Lead Channel Pacing Threshold Amplitude: 1.5 V
Lead Channel Pacing Threshold Amplitude: 1.625 V
Lead Channel Pacing Threshold Pulse Width: 0.4 ms
Lead Channel Pacing Threshold Pulse Width: 0.4 ms
Lead Channel Setting Pacing Amplitude: 3 V
Lead Channel Setting Pacing Amplitude: 3.25 V
Lead Channel Setting Pacing Pulse Width: 0.4 ms
Lead Channel Setting Sensing Sensitivity: 5.6 mV

## 2015-03-09 ENCOUNTER — Ambulatory Visit (HOSPITAL_COMMUNITY): Payer: Medicare Other | Attending: Cardiology

## 2015-03-09 ENCOUNTER — Other Ambulatory Visit: Payer: Self-pay

## 2015-03-09 DIAGNOSIS — I340 Nonrheumatic mitral (valve) insufficiency: Secondary | ICD-10-CM | POA: Diagnosis not present

## 2015-03-09 DIAGNOSIS — R55 Syncope and collapse: Secondary | ICD-10-CM | POA: Diagnosis not present

## 2015-03-09 DIAGNOSIS — I517 Cardiomegaly: Secondary | ICD-10-CM | POA: Diagnosis not present

## 2015-03-09 DIAGNOSIS — I4891 Unspecified atrial fibrillation: Secondary | ICD-10-CM | POA: Insufficient documentation

## 2015-03-09 DIAGNOSIS — I059 Rheumatic mitral valve disease, unspecified: Secondary | ICD-10-CM | POA: Diagnosis not present

## 2015-03-09 DIAGNOSIS — I371 Nonrheumatic pulmonary valve insufficiency: Secondary | ICD-10-CM | POA: Diagnosis not present

## 2015-03-09 DIAGNOSIS — I350 Nonrheumatic aortic (valve) stenosis: Secondary | ICD-10-CM | POA: Insufficient documentation

## 2015-03-10 DIAGNOSIS — M6281 Muscle weakness (generalized): Secondary | ICD-10-CM | POA: Diagnosis not present

## 2015-03-10 DIAGNOSIS — R260 Ataxic gait: Secondary | ICD-10-CM | POA: Diagnosis not present

## 2015-03-10 DIAGNOSIS — M545 Low back pain: Secondary | ICD-10-CM | POA: Diagnosis not present

## 2015-03-10 DIAGNOSIS — R293 Abnormal posture: Secondary | ICD-10-CM | POA: Diagnosis not present

## 2015-03-15 DIAGNOSIS — R260 Ataxic gait: Secondary | ICD-10-CM | POA: Diagnosis not present

## 2015-03-15 DIAGNOSIS — M545 Low back pain: Secondary | ICD-10-CM | POA: Diagnosis not present

## 2015-03-15 DIAGNOSIS — R293 Abnormal posture: Secondary | ICD-10-CM | POA: Diagnosis not present

## 2015-03-15 DIAGNOSIS — M6281 Muscle weakness (generalized): Secondary | ICD-10-CM | POA: Diagnosis not present

## 2015-03-17 DIAGNOSIS — R293 Abnormal posture: Secondary | ICD-10-CM | POA: Diagnosis not present

## 2015-03-17 DIAGNOSIS — M545 Low back pain: Secondary | ICD-10-CM | POA: Diagnosis not present

## 2015-03-17 DIAGNOSIS — R260 Ataxic gait: Secondary | ICD-10-CM | POA: Diagnosis not present

## 2015-03-17 DIAGNOSIS — M6281 Muscle weakness (generalized): Secondary | ICD-10-CM | POA: Diagnosis not present

## 2015-03-22 DIAGNOSIS — R293 Abnormal posture: Secondary | ICD-10-CM | POA: Diagnosis not present

## 2015-03-22 DIAGNOSIS — M6281 Muscle weakness (generalized): Secondary | ICD-10-CM | POA: Diagnosis not present

## 2015-03-22 DIAGNOSIS — M545 Low back pain: Secondary | ICD-10-CM | POA: Diagnosis not present

## 2015-03-22 DIAGNOSIS — R260 Ataxic gait: Secondary | ICD-10-CM | POA: Diagnosis not present

## 2015-03-24 DIAGNOSIS — M545 Low back pain: Secondary | ICD-10-CM | POA: Diagnosis not present

## 2015-03-24 DIAGNOSIS — R293 Abnormal posture: Secondary | ICD-10-CM | POA: Diagnosis not present

## 2015-03-24 DIAGNOSIS — R260 Ataxic gait: Secondary | ICD-10-CM | POA: Diagnosis not present

## 2015-03-24 DIAGNOSIS — M6281 Muscle weakness (generalized): Secondary | ICD-10-CM | POA: Diagnosis not present

## 2015-03-31 DIAGNOSIS — R260 Ataxic gait: Secondary | ICD-10-CM | POA: Diagnosis not present

## 2015-03-31 DIAGNOSIS — M545 Low back pain: Secondary | ICD-10-CM | POA: Diagnosis not present

## 2015-03-31 DIAGNOSIS — R293 Abnormal posture: Secondary | ICD-10-CM | POA: Diagnosis not present

## 2015-03-31 DIAGNOSIS — M6281 Muscle weakness (generalized): Secondary | ICD-10-CM | POA: Diagnosis not present

## 2015-04-05 DIAGNOSIS — M545 Low back pain: Secondary | ICD-10-CM | POA: Diagnosis not present

## 2015-04-05 DIAGNOSIS — M6281 Muscle weakness (generalized): Secondary | ICD-10-CM | POA: Diagnosis not present

## 2015-04-05 DIAGNOSIS — R260 Ataxic gait: Secondary | ICD-10-CM | POA: Diagnosis not present

## 2015-04-05 DIAGNOSIS — R293 Abnormal posture: Secondary | ICD-10-CM | POA: Diagnosis not present

## 2015-04-09 DIAGNOSIS — R260 Ataxic gait: Secondary | ICD-10-CM | POA: Diagnosis not present

## 2015-04-09 DIAGNOSIS — R293 Abnormal posture: Secondary | ICD-10-CM | POA: Diagnosis not present

## 2015-04-09 DIAGNOSIS — M6281 Muscle weakness (generalized): Secondary | ICD-10-CM | POA: Diagnosis not present

## 2015-04-09 DIAGNOSIS — M545 Low back pain: Secondary | ICD-10-CM | POA: Diagnosis not present

## 2015-04-12 DIAGNOSIS — R260 Ataxic gait: Secondary | ICD-10-CM | POA: Diagnosis not present

## 2015-04-12 DIAGNOSIS — M545 Low back pain: Secondary | ICD-10-CM | POA: Diagnosis not present

## 2015-04-12 DIAGNOSIS — R293 Abnormal posture: Secondary | ICD-10-CM | POA: Diagnosis not present

## 2015-04-12 DIAGNOSIS — M6281 Muscle weakness (generalized): Secondary | ICD-10-CM | POA: Diagnosis not present

## 2015-04-14 ENCOUNTER — Ambulatory Visit (INDEPENDENT_AMBULATORY_CARE_PROVIDER_SITE_OTHER): Payer: Medicare Other | Admitting: Interventional Cardiology

## 2015-04-14 DIAGNOSIS — I4891 Unspecified atrial fibrillation: Secondary | ICD-10-CM

## 2015-04-14 DIAGNOSIS — Z5181 Encounter for therapeutic drug level monitoring: Secondary | ICD-10-CM

## 2015-04-14 DIAGNOSIS — Z7901 Long term (current) use of anticoagulants: Secondary | ICD-10-CM | POA: Diagnosis not present

## 2015-04-14 LAB — PROTIME-INR: INR: 1.7 — AB (ref 0.9–1.1)

## 2015-04-15 ENCOUNTER — Encounter: Payer: Self-pay | Admitting: Internal Medicine

## 2015-04-29 DIAGNOSIS — Z1231 Encounter for screening mammogram for malignant neoplasm of breast: Secondary | ICD-10-CM | POA: Diagnosis not present

## 2015-05-02 DIAGNOSIS — H01001 Unspecified blepharitis right upper eyelid: Secondary | ICD-10-CM | POA: Diagnosis not present

## 2015-05-02 DIAGNOSIS — H01005 Unspecified blepharitis left lower eyelid: Secondary | ICD-10-CM | POA: Diagnosis not present

## 2015-05-02 DIAGNOSIS — H01002 Unspecified blepharitis right lower eyelid: Secondary | ICD-10-CM | POA: Diagnosis not present

## 2015-05-02 DIAGNOSIS — H01004 Unspecified blepharitis left upper eyelid: Secondary | ICD-10-CM | POA: Diagnosis not present

## 2015-05-05 ENCOUNTER — Ambulatory Visit (INDEPENDENT_AMBULATORY_CARE_PROVIDER_SITE_OTHER): Payer: Medicare Other | Admitting: Cardiology

## 2015-05-05 DIAGNOSIS — I4891 Unspecified atrial fibrillation: Secondary | ICD-10-CM

## 2015-05-05 DIAGNOSIS — Z5181 Encounter for therapeutic drug level monitoring: Secondary | ICD-10-CM

## 2015-05-05 DIAGNOSIS — Z7901 Long term (current) use of anticoagulants: Secondary | ICD-10-CM | POA: Diagnosis not present

## 2015-05-05 LAB — PROTIME-INR: INR: 1.9 — AB (ref 0.9–1.1)

## 2015-05-11 ENCOUNTER — Encounter: Payer: Self-pay | Admitting: Internal Medicine

## 2015-05-19 ENCOUNTER — Telehealth: Payer: Self-pay | Admitting: Cardiology

## 2015-05-19 ENCOUNTER — Ambulatory Visit (INDEPENDENT_AMBULATORY_CARE_PROVIDER_SITE_OTHER): Payer: Medicare Other | Admitting: *Deleted

## 2015-05-19 DIAGNOSIS — I442 Atrioventricular block, complete: Secondary | ICD-10-CM | POA: Diagnosis not present

## 2015-05-19 LAB — CUP PACEART REMOTE DEVICE CHECK
Battery Impedance: 342 Ohm
Battery Remaining Longevity: 96 mo
Battery Voltage: 2.79 V
Brady Statistic AP VP Percent: 8 %
Brady Statistic AP VS Percent: 0 %
Brady Statistic AS VP Percent: 92 %
Brady Statistic AS VS Percent: 0 %
Date Time Interrogation Session: 20170105173103
Implantable Lead Implant Date: 19980518
Implantable Lead Implant Date: 20040301
Implantable Lead Location: 753859
Implantable Lead Location: 753860
Implantable Lead Model: 4269
Implantable Lead Model: 4285
Implantable Lead Serial Number: 249440
Implantable Lead Serial Number: 295540
Lead Channel Impedance Value: 624 Ohm
Lead Channel Impedance Value: 877 Ohm
Lead Channel Pacing Threshold Amplitude: 1.375 V
Lead Channel Pacing Threshold Amplitude: 1.75 V
Lead Channel Pacing Threshold Pulse Width: 0.4 ms
Lead Channel Pacing Threshold Pulse Width: 0.4 ms
Lead Channel Setting Pacing Amplitude: 2.75 V
Lead Channel Setting Pacing Amplitude: 3.5 V
Lead Channel Setting Pacing Pulse Width: 0.4 ms
Lead Channel Setting Sensing Sensitivity: 5.6 mV

## 2015-05-19 NOTE — Telephone Encounter (Signed)
Spoke with pt and reminded pt of remote transmission that is due today. Pt verbalized understanding.   

## 2015-05-20 NOTE — Progress Notes (Signed)
Remote pacemaker transmission.   

## 2015-05-26 ENCOUNTER — Ambulatory Visit (INDEPENDENT_AMBULATORY_CARE_PROVIDER_SITE_OTHER): Payer: Medicare Other | Admitting: Pharmacist

## 2015-05-26 DIAGNOSIS — Z7901 Long term (current) use of anticoagulants: Secondary | ICD-10-CM | POA: Diagnosis not present

## 2015-05-26 DIAGNOSIS — I4891 Unspecified atrial fibrillation: Secondary | ICD-10-CM

## 2015-05-26 DIAGNOSIS — Z5181 Encounter for therapeutic drug level monitoring: Secondary | ICD-10-CM

## 2015-05-26 LAB — POCT INR: INR: 1.66

## 2015-06-01 ENCOUNTER — Encounter: Payer: Self-pay | Admitting: Cardiology

## 2015-06-09 ENCOUNTER — Ambulatory Visit (INDEPENDENT_AMBULATORY_CARE_PROVIDER_SITE_OTHER): Payer: Medicare Other | Admitting: Internal Medicine

## 2015-06-09 DIAGNOSIS — Z7901 Long term (current) use of anticoagulants: Secondary | ICD-10-CM | POA: Diagnosis not present

## 2015-06-09 DIAGNOSIS — Z5181 Encounter for therapeutic drug level monitoring: Secondary | ICD-10-CM

## 2015-06-09 DIAGNOSIS — I4891 Unspecified atrial fibrillation: Secondary | ICD-10-CM

## 2015-06-09 LAB — PROTIME-INR: INR: 2.1 — AB (ref 0.9–1.1)

## 2015-06-30 ENCOUNTER — Ambulatory Visit (INDEPENDENT_AMBULATORY_CARE_PROVIDER_SITE_OTHER): Payer: Medicare Other | Admitting: Cardiovascular Disease

## 2015-06-30 DIAGNOSIS — Z7901 Long term (current) use of anticoagulants: Secondary | ICD-10-CM | POA: Diagnosis not present

## 2015-06-30 DIAGNOSIS — I4891 Unspecified atrial fibrillation: Secondary | ICD-10-CM | POA: Diagnosis not present

## 2015-06-30 DIAGNOSIS — Z5181 Encounter for therapeutic drug level monitoring: Secondary | ICD-10-CM

## 2015-06-30 LAB — PROTIME-INR: INR: 2.4 — AB (ref 0.9–1.1)

## 2015-07-07 DIAGNOSIS — F419 Anxiety disorder, unspecified: Secondary | ICD-10-CM | POA: Diagnosis not present

## 2015-07-07 DIAGNOSIS — R2681 Unsteadiness on feet: Secondary | ICD-10-CM | POA: Diagnosis not present

## 2015-07-13 ENCOUNTER — Telehealth: Payer: Self-pay | Admitting: *Deleted

## 2015-07-13 DIAGNOSIS — R05 Cough: Secondary | ICD-10-CM | POA: Diagnosis not present

## 2015-07-13 DIAGNOSIS — R0982 Postnasal drip: Secondary | ICD-10-CM | POA: Diagnosis not present

## 2015-07-13 DIAGNOSIS — J329 Chronic sinusitis, unspecified: Secondary | ICD-10-CM | POA: Diagnosis not present

## 2015-07-13 NOTE — Telephone Encounter (Signed)
Spouse calls to inform us that pt is starting Amoxicillin 500mg  BID for 10 days today. Informed him there is no interaction with ABX and Coumadin, but if she experiences any GI upset(N/V or diarrhea) call us because that could increase the INR. Understanding verbalized.

## 2015-08-01 ENCOUNTER — Ambulatory Visit (INDEPENDENT_AMBULATORY_CARE_PROVIDER_SITE_OTHER): Payer: Medicare Other | Admitting: Cardiovascular Disease

## 2015-08-01 DIAGNOSIS — I4891 Unspecified atrial fibrillation: Secondary | ICD-10-CM

## 2015-08-01 DIAGNOSIS — Z5181 Encounter for therapeutic drug level monitoring: Secondary | ICD-10-CM

## 2015-08-01 DIAGNOSIS — Z7901 Long term (current) use of anticoagulants: Secondary | ICD-10-CM | POA: Diagnosis not present

## 2015-08-01 LAB — PROTIME-INR: INR: 2.3 — AB (ref 0.9–1.1)

## 2015-08-18 ENCOUNTER — Ambulatory Visit (INDEPENDENT_AMBULATORY_CARE_PROVIDER_SITE_OTHER): Payer: Medicare Other | Admitting: *Deleted

## 2015-08-18 ENCOUNTER — Telehealth: Payer: Self-pay | Admitting: Cardiology

## 2015-08-18 DIAGNOSIS — I442 Atrioventricular block, complete: Secondary | ICD-10-CM

## 2015-08-18 NOTE — Telephone Encounter (Signed)
Spoke with pt and reminded pt of remote transmission that is due today. Pt verbalized understanding.   

## 2015-08-19 NOTE — Progress Notes (Signed)
Remote pacemaker transmission.   

## 2015-08-23 DIAGNOSIS — I509 Heart failure, unspecified: Secondary | ICD-10-CM | POA: Diagnosis not present

## 2015-08-23 DIAGNOSIS — M810 Age-related osteoporosis without current pathological fracture: Secondary | ICD-10-CM | POA: Diagnosis not present

## 2015-08-23 DIAGNOSIS — K219 Gastro-esophageal reflux disease without esophagitis: Secondary | ICD-10-CM | POA: Diagnosis not present

## 2015-08-23 DIAGNOSIS — E785 Hyperlipidemia, unspecified: Secondary | ICD-10-CM | POA: Diagnosis not present

## 2015-08-29 ENCOUNTER — Ambulatory Visit (INDEPENDENT_AMBULATORY_CARE_PROVIDER_SITE_OTHER): Payer: Medicare Other | Admitting: Cardiovascular Disease

## 2015-08-29 DIAGNOSIS — I4891 Unspecified atrial fibrillation: Secondary | ICD-10-CM

## 2015-08-29 DIAGNOSIS — Z7901 Long term (current) use of anticoagulants: Secondary | ICD-10-CM | POA: Diagnosis not present

## 2015-08-29 DIAGNOSIS — Z5181 Encounter for therapeutic drug level monitoring: Secondary | ICD-10-CM

## 2015-08-29 LAB — PROTIME-INR: INR: 3 — AB (ref 0.9–1.1)

## 2015-08-30 DIAGNOSIS — M542 Cervicalgia: Secondary | ICD-10-CM | POA: Diagnosis not present

## 2015-08-30 DIAGNOSIS — E785 Hyperlipidemia, unspecified: Secondary | ICD-10-CM | POA: Diagnosis not present

## 2015-08-30 DIAGNOSIS — M5032 Other cervical disc degeneration, mid-cervical region, unspecified level: Secondary | ICD-10-CM | POA: Diagnosis not present

## 2015-08-30 DIAGNOSIS — F339 Major depressive disorder, recurrent, unspecified: Secondary | ICD-10-CM | POA: Diagnosis not present

## 2015-08-30 DIAGNOSIS — I5032 Chronic diastolic (congestive) heart failure: Secondary | ICD-10-CM | POA: Diagnosis not present

## 2015-08-30 DIAGNOSIS — N183 Chronic kidney disease, stage 3 (moderate): Secondary | ICD-10-CM | POA: Diagnosis not present

## 2015-09-09 DIAGNOSIS — M542 Cervicalgia: Secondary | ICD-10-CM | POA: Diagnosis not present

## 2015-09-09 DIAGNOSIS — M6281 Muscle weakness (generalized): Secondary | ICD-10-CM | POA: Diagnosis not present

## 2015-09-09 DIAGNOSIS — R293 Abnormal posture: Secondary | ICD-10-CM | POA: Diagnosis not present

## 2015-09-09 DIAGNOSIS — M545 Low back pain: Secondary | ICD-10-CM | POA: Diagnosis not present

## 2015-09-09 DIAGNOSIS — R260 Ataxic gait: Secondary | ICD-10-CM | POA: Diagnosis not present

## 2015-09-13 DIAGNOSIS — M545 Low back pain: Secondary | ICD-10-CM | POA: Diagnosis not present

## 2015-09-13 DIAGNOSIS — R293 Abnormal posture: Secondary | ICD-10-CM | POA: Diagnosis not present

## 2015-09-13 DIAGNOSIS — M542 Cervicalgia: Secondary | ICD-10-CM | POA: Diagnosis not present

## 2015-09-13 DIAGNOSIS — R260 Ataxic gait: Secondary | ICD-10-CM | POA: Diagnosis not present

## 2015-09-13 DIAGNOSIS — M6281 Muscle weakness (generalized): Secondary | ICD-10-CM | POA: Diagnosis not present

## 2015-09-16 DIAGNOSIS — R260 Ataxic gait: Secondary | ICD-10-CM | POA: Diagnosis not present

## 2015-09-16 DIAGNOSIS — M545 Low back pain: Secondary | ICD-10-CM | POA: Diagnosis not present

## 2015-09-16 DIAGNOSIS — M542 Cervicalgia: Secondary | ICD-10-CM | POA: Diagnosis not present

## 2015-09-16 DIAGNOSIS — M6281 Muscle weakness (generalized): Secondary | ICD-10-CM | POA: Diagnosis not present

## 2015-09-16 DIAGNOSIS — R293 Abnormal posture: Secondary | ICD-10-CM | POA: Diagnosis not present

## 2015-09-19 ENCOUNTER — Other Ambulatory Visit: Payer: Self-pay | Admitting: *Deleted

## 2015-09-19 MED ORDER — WARFARIN SODIUM 5 MG PO TABS: ORAL_TABLET | ORAL | Status: DC

## 2015-09-26 ENCOUNTER — Ambulatory Visit (INDEPENDENT_AMBULATORY_CARE_PROVIDER_SITE_OTHER): Payer: Medicare Other | Admitting: Interventional Cardiology

## 2015-09-26 DIAGNOSIS — I4891 Unspecified atrial fibrillation: Secondary | ICD-10-CM

## 2015-09-26 DIAGNOSIS — Z5181 Encounter for therapeutic drug level monitoring: Secondary | ICD-10-CM

## 2015-09-26 LAB — PROTIME-INR: INR: 2.9 — AB (ref 0.9–1.1)

## 2015-09-30 LAB — CUP PACEART REMOTE DEVICE CHECK
Battery Impedance: 366 Ohm
Battery Remaining Longevity: 92 mo
Battery Voltage: 2.79 V
Brady Statistic AP VP Percent: 8 %
Brady Statistic AP VS Percent: 0 %
Brady Statistic AS VP Percent: 92 %
Brady Statistic AS VS Percent: 0 %
Date Time Interrogation Session: 20170406154327
Implantable Lead Implant Date: 19980518
Implantable Lead Implant Date: 20040301
Implantable Lead Location: 753859
Implantable Lead Location: 753860
Implantable Lead Model: 4269
Implantable Lead Model: 4285
Implantable Lead Serial Number: 249440
Implantable Lead Serial Number: 295540
Lead Channel Impedance Value: 613 Ohm
Lead Channel Impedance Value: 884 Ohm
Lead Channel Pacing Threshold Amplitude: 1.5 V
Lead Channel Pacing Threshold Amplitude: 1.625 V
Lead Channel Pacing Threshold Pulse Width: 0.4 ms
Lead Channel Pacing Threshold Pulse Width: 0.4 ms
Lead Channel Setting Pacing Amplitude: 3 V
Lead Channel Setting Pacing Amplitude: 3.25 V
Lead Channel Setting Pacing Pulse Width: 0.4 ms
Lead Channel Setting Sensing Sensitivity: 5.6 mV

## 2015-10-04 ENCOUNTER — Encounter: Payer: Self-pay | Admitting: Cardiology

## 2015-10-24 ENCOUNTER — Ambulatory Visit (INDEPENDENT_AMBULATORY_CARE_PROVIDER_SITE_OTHER): Payer: Medicare Other | Admitting: Cardiology

## 2015-10-24 DIAGNOSIS — L570 Actinic keratosis: Secondary | ICD-10-CM | POA: Diagnosis not present

## 2015-10-24 DIAGNOSIS — I4891 Unspecified atrial fibrillation: Secondary | ICD-10-CM | POA: Diagnosis not present

## 2015-10-24 DIAGNOSIS — Z7901 Long term (current) use of anticoagulants: Secondary | ICD-10-CM | POA: Diagnosis not present

## 2015-10-24 DIAGNOSIS — Z5181 Encounter for therapeutic drug level monitoring: Secondary | ICD-10-CM

## 2015-10-24 DIAGNOSIS — L821 Other seborrheic keratosis: Secondary | ICD-10-CM | POA: Diagnosis not present

## 2015-10-24 DIAGNOSIS — L720 Epidermal cyst: Secondary | ICD-10-CM | POA: Diagnosis not present

## 2015-10-24 LAB — PROTIME-INR: INR: 2.6 — AB (ref 0.9–1.1)

## 2015-11-04 DIAGNOSIS — M216X2 Other acquired deformities of left foot: Secondary | ICD-10-CM | POA: Diagnosis not present

## 2015-11-04 DIAGNOSIS — L84 Corns and callosities: Secondary | ICD-10-CM | POA: Diagnosis not present

## 2015-11-21 ENCOUNTER — Ambulatory Visit (INDEPENDENT_AMBULATORY_CARE_PROVIDER_SITE_OTHER): Payer: Medicare Other

## 2015-11-21 DIAGNOSIS — I4891 Unspecified atrial fibrillation: Secondary | ICD-10-CM

## 2015-11-21 DIAGNOSIS — Z7901 Long term (current) use of anticoagulants: Secondary | ICD-10-CM | POA: Diagnosis not present

## 2015-11-21 DIAGNOSIS — Z5181 Encounter for therapeutic drug level monitoring: Secondary | ICD-10-CM

## 2015-11-21 LAB — PROTIME-INR: INR: 2.2 — AB (ref 0.9–1.1)

## 2015-11-25 DIAGNOSIS — H353221 Exudative age-related macular degeneration, left eye, with active choroidal neovascularization: Secondary | ICD-10-CM | POA: Diagnosis not present

## 2015-11-25 DIAGNOSIS — H353111 Nonexudative age-related macular degeneration, right eye, early dry stage: Secondary | ICD-10-CM | POA: Diagnosis not present

## 2015-12-01 DIAGNOSIS — H353221 Exudative age-related macular degeneration, left eye, with active choroidal neovascularization: Secondary | ICD-10-CM | POA: Diagnosis not present

## 2015-12-07 ENCOUNTER — Ambulatory Visit (INDEPENDENT_AMBULATORY_CARE_PROVIDER_SITE_OTHER): Payer: Medicare Other | Admitting: Internal Medicine

## 2015-12-07 ENCOUNTER — Encounter: Payer: Self-pay | Admitting: Internal Medicine

## 2015-12-07 ENCOUNTER — Encounter: Payer: Medicare Other | Admitting: Internal Medicine

## 2015-12-07 DIAGNOSIS — I442 Atrioventricular block, complete: Secondary | ICD-10-CM | POA: Diagnosis not present

## 2015-12-07 NOTE — Progress Notes (Signed)
PCP: [name], MD  [name] is a 82 y.o. female who presents today for routine electrophysiology followup.  She is enjoying friends home [place] and is participating in their activity with enjoyment.  Dizziness is unchanged.  Venous insufficiency in L leg is stable (prior DVT).  Today, she denies symptoms of palpitations, exertional chest pain, shortness of breath,  lower extremity edema, or syncope.  The patient is otherwise without complaint today.   Past Medical History:  Diagnosis Date  . Arthritis   . Atrial fibrillation ([place])   . Dementia   . Depression   . DVT (deep venous thrombosis) ([place]) 2011  . HB (heart block)    s/p PPM, most recent generator change 12/11 by JA  . Hiatal hernia   . Hyperlipidemia   . Hypothyroidism   . PTE (post-transplant erythrocytosis)   . Pulmonary embolism ([place]) 2011   on coumadin  . UTI (lower urinary tract infection)    Past Surgical History:  Procedure Laterality Date  . CHOLECYSTECTOMY  09/2010  . HEMORRHOID SURGERY    . hysterectomy (other)    . JOINT REPLACEMENT    . pacemaker     most recent generator 12/11 by JA  . TOTAL KNEE ARTHROPLASTY Left     Current Outpatient Prescriptions  Medication Sig Dispense Refill  . Calcium Carbonate (CALCIUM-CARB 600 PO) Take 1 capsule by mouth daily.     . cholecalciferol (VITAMIN D) 1000 UNITS tablet Take 1,000 Units by mouth daily.     . citalopram (CELEXA) 20 MG tablet Take 20 mg by mouth 2 (two) times daily.     [name] donepezil (ARICEPT) 5 MG tablet Take 5 mg by mouth at bedtime.    [name] HYDROCODONE-CHLORPHENIRAMINE PO Take 1.23 mLs by mouth at bedtime as needed (cough and cold symptoms).     [name] HYDROcodone-homatropine (HYCODAN) 5-1.5 MG/5ML syrup Take 2.5 mLs by mouth daily as needed for cough.     . levETIRAcetam (KEPPRA) 250 MG tablet Take 250 mg by mouth daily.     [name] levothyroxine (SYNTHROID, LEVOTHROID) 50 MCG tablet Take 50 mcg by mouth daily before breakfast.    . loratadine  (CLARITIN) 10 MG tablet Take 10 mg by mouth daily as needed for allergies or rhinitis.     . meloxicam (MOBIC) 7.5 MG tablet Take 7.5 mg by mouth 2 (two) times daily.    . memantine (NAMENDA) 10 MG tablet Take 5 mg by mouth 2 (two) times daily.     [name] NITROSTAT 0.4 MG SL tablet Place 0.4 mg under the tongue every 5 (five) minutes as needed for chest pain (MAX 3 TABLETS).     [name] omeprazole (PRILOSEC) 20 MG capsule Take 20 mg by mouth 2 (two) times daily.     . pravastatin (PRAVACHOL) 20 MG tablet Take 20 mg by mouth daily.      . Probiotic Product (PROBIOTIC DAILY PO) Take 1 capsule by mouth daily.    Orlie Dakin Sodium (SENNA-S PO) Take 1 tablet by mouth 2 (two) times daily.     [name] warfarin (COUMADIN) 5 MG tablet Take as directed by coumadin clinic 120 tablet 1   No current facility-administered medications for this visit.    ROS- all systems are reviewed at length today.    Physical Exam: Vitals:   12/07/15 1359  BP: (!) 110/54  Pulse: 84  SpO2: 92%  Weight: 142 lb (64.4 kg)  Height: 5' (1.524 m)    GEN- The patient is elderly  appearing, alert and oriented x 3 today.   Head- normocephalic, atraumatic Eyes-  Sclera clear, conjunctiva pink Ears- hearing intact Oropharynx- clear Lungs- Clear to ausculation bilaterally, normal work of breathing Chest- pacemaker pocket is well healed Heart- Regular rate and rhythm, 2/6 SEM LSB GI- soft, NT, ND, + BS Extremities- no clubbing, cyanosis, 1+ L leg edema  Pacemaker interrogation- reviewed in detail today,  See PACEART report  Assessment and Plan:  1. Complete heart block Normal pacemaker function See Pace Art report No changes today  2. Afib/ h/o PTE Continue long term anticoagulation with coumadin Avoidance of NSAIDS was advised today (she still has Mobic on her list but isnt sure if she is taking it).  3. Postural dizziness She has chronic issues with lightheadedness and unsteadiness. No further CV workup is  planned She doesn't want to wear support hose  Carelink Return to see me in 1 year unless problems arise  Thompson Grayer MD, [facility] 12/07/2015 2:29 PM

## 2015-12-07 NOTE — Patient Instructions (Addendum)
Medication Instructions:  Your physician recommends that you continue on your current medications as directed. Please refer to the Current Medication list given to you today.   Labwork: None ordered   Testing/Procedures: None ordered   Follow-Up: Your physician wants you to follow-up in:   12 months with [place] will receive a reminder letter in the mail two months in advance. If you don't receive a letter, please call our office to schedule the follow-up appointment.   Remote monitoring is used to monitor your Pacemaker from home. This monitoring reduces the number of office visits required to check your device to one time per year. It allows [place] to keep an eye on the functioning of your device to ensure it is working properly. You are scheduled for a device check from home on 03/07/16. You may send your transmission at any time that day. If you have a wireless device, the transmission will be sent automatically. After your physician reviews your transmission, you will receive a postcard with your next transmission date.     Any Other Special Instructions Will Be Listed Below (If Applicable).     If you need a refill on your cardiac medications before your next appointment, please call your pharmacy.

## 2015-12-19 ENCOUNTER — Ambulatory Visit (INDEPENDENT_AMBULATORY_CARE_PROVIDER_SITE_OTHER): Payer: Medicare Other | Admitting: Cardiology

## 2015-12-19 DIAGNOSIS — I4891 Unspecified atrial fibrillation: Secondary | ICD-10-CM

## 2015-12-19 DIAGNOSIS — Z5181 Encounter for therapeutic drug level monitoring: Secondary | ICD-10-CM

## 2015-12-19 DIAGNOSIS — Z7901 Long term (current) use of anticoagulants: Secondary | ICD-10-CM | POA: Diagnosis not present

## 2015-12-19 LAB — PROTIME-INR: INR: 2.9 — AB (ref 0.9–1.1)

## 2015-12-29 DIAGNOSIS — H353221 Exudative age-related macular degeneration, left eye, with active choroidal neovascularization: Secondary | ICD-10-CM | POA: Diagnosis not present

## 2016-01-05 DIAGNOSIS — F09 Unspecified mental disorder due to known physiological condition: Secondary | ICD-10-CM | POA: Diagnosis not present

## 2016-01-05 DIAGNOSIS — F419 Anxiety disorder, unspecified: Secondary | ICD-10-CM | POA: Diagnosis not present

## 2016-01-09 DIAGNOSIS — Z23 Encounter for immunization: Secondary | ICD-10-CM | POA: Diagnosis not present

## 2016-01-19 ENCOUNTER — Ambulatory Visit (INDEPENDENT_AMBULATORY_CARE_PROVIDER_SITE_OTHER): Payer: Medicare Other | Admitting: Cardiovascular Disease

## 2016-01-19 DIAGNOSIS — Z5181 Encounter for therapeutic drug level monitoring: Secondary | ICD-10-CM

## 2016-01-19 DIAGNOSIS — Z7901 Long term (current) use of anticoagulants: Secondary | ICD-10-CM | POA: Diagnosis not present

## 2016-01-19 DIAGNOSIS — I4891 Unspecified atrial fibrillation: Secondary | ICD-10-CM

## 2016-01-19 LAB — PROTIME-INR: INR: 2.8 — AB (ref 0.9–1.1)

## 2016-01-26 DIAGNOSIS — H353221 Exudative age-related macular degeneration, left eye, with active choroidal neovascularization: Secondary | ICD-10-CM | POA: Diagnosis not present

## 2016-02-14 DIAGNOSIS — M545 Low back pain: Secondary | ICD-10-CM | POA: Diagnosis not present

## 2016-02-16 ENCOUNTER — Ambulatory Visit (INDEPENDENT_AMBULATORY_CARE_PROVIDER_SITE_OTHER): Payer: Medicare Other | Admitting: Internal Medicine

## 2016-02-16 DIAGNOSIS — I4891 Unspecified atrial fibrillation: Secondary | ICD-10-CM | POA: Diagnosis not present

## 2016-02-16 DIAGNOSIS — Z7901 Long term (current) use of anticoagulants: Secondary | ICD-10-CM | POA: Diagnosis not present

## 2016-02-16 DIAGNOSIS — Z5181 Encounter for therapeutic drug level monitoring: Secondary | ICD-10-CM

## 2016-02-16 LAB — PROTIME-INR: INR: 2.8 — AB (ref 0.9–1.1)

## 2016-02-21 DIAGNOSIS — E785 Hyperlipidemia, unspecified: Secondary | ICD-10-CM | POA: Diagnosis not present

## 2016-02-21 DIAGNOSIS — M810 Age-related osteoporosis without current pathological fracture: Secondary | ICD-10-CM | POA: Diagnosis not present

## 2016-02-28 DIAGNOSIS — E785 Hyperlipidemia, unspecified: Secondary | ICD-10-CM | POA: Diagnosis not present

## 2016-02-28 DIAGNOSIS — M810 Age-related osteoporosis without current pathological fracture: Secondary | ICD-10-CM | POA: Diagnosis not present

## 2016-02-28 DIAGNOSIS — I4891 Unspecified atrial fibrillation: Secondary | ICD-10-CM | POA: Diagnosis not present

## 2016-02-28 DIAGNOSIS — Z1389 Encounter for screening for other disorder: Secondary | ICD-10-CM | POA: Diagnosis not present

## 2016-02-28 DIAGNOSIS — Z0001 Encounter for general adult medical examination with abnormal findings: Secondary | ICD-10-CM | POA: Diagnosis not present

## 2016-02-28 DIAGNOSIS — I5032 Chronic diastolic (congestive) heart failure: Secondary | ICD-10-CM | POA: Diagnosis not present

## 2016-02-28 DIAGNOSIS — F3340 Major depressive disorder, recurrent, in remission, unspecified: Secondary | ICD-10-CM | POA: Diagnosis not present

## 2016-02-28 DIAGNOSIS — E663 Overweight: Secondary | ICD-10-CM | POA: Diagnosis not present

## 2016-03-01 DIAGNOSIS — H353221 Exudative age-related macular degeneration, left eye, with active choroidal neovascularization: Secondary | ICD-10-CM | POA: Diagnosis not present

## 2016-03-06 DIAGNOSIS — M810 Age-related osteoporosis without current pathological fracture: Secondary | ICD-10-CM | POA: Diagnosis not present

## 2016-03-07 ENCOUNTER — Telehealth: Payer: Self-pay | Admitting: Cardiology

## 2016-03-07 ENCOUNTER — Ambulatory Visit (INDEPENDENT_AMBULATORY_CARE_PROVIDER_SITE_OTHER): Payer: Medicare Other | Admitting: *Deleted

## 2016-03-07 DIAGNOSIS — I4891 Unspecified atrial fibrillation: Secondary | ICD-10-CM

## 2016-03-07 DIAGNOSIS — I442 Atrioventricular block, complete: Secondary | ICD-10-CM

## 2016-03-07 NOTE — Progress Notes (Signed)
Remote pacemaker transmission.   

## 2016-03-07 NOTE — Telephone Encounter (Signed)
Confirmed remote transmission w/ pt husband.   

## 2016-03-08 ENCOUNTER — Encounter: Payer: Self-pay | Admitting: Cardiology

## 2016-03-27 DIAGNOSIS — R51 Headache: Secondary | ICD-10-CM | POA: Diagnosis not present

## 2016-03-27 DIAGNOSIS — M170 Bilateral primary osteoarthritis of knee: Secondary | ICD-10-CM | POA: Diagnosis not present

## 2016-03-27 DIAGNOSIS — M1990 Unspecified osteoarthritis, unspecified site: Secondary | ICD-10-CM | POA: Diagnosis not present

## 2016-03-27 DIAGNOSIS — H5712 Ocular pain, left eye: Secondary | ICD-10-CM | POA: Diagnosis not present

## 2016-03-27 DIAGNOSIS — Z79899 Other long term (current) drug therapy: Secondary | ICD-10-CM | POA: Diagnosis not present

## 2016-03-27 DIAGNOSIS — I5032 Chronic diastolic (congestive) heart failure: Secondary | ICD-10-CM | POA: Diagnosis not present

## 2016-03-29 DIAGNOSIS — I4891 Unspecified atrial fibrillation: Secondary | ICD-10-CM | POA: Diagnosis not present

## 2016-03-29 DIAGNOSIS — Z7901 Long term (current) use of anticoagulants: Secondary | ICD-10-CM | POA: Diagnosis not present

## 2016-03-29 LAB — PROTIME-INR: INR: 3 — AB (ref 0.9–1.1)

## 2016-03-30 ENCOUNTER — Ambulatory Visit (INDEPENDENT_AMBULATORY_CARE_PROVIDER_SITE_OTHER): Payer: Medicare Other | Admitting: Cardiology

## 2016-03-30 DIAGNOSIS — I4891 Unspecified atrial fibrillation: Secondary | ICD-10-CM

## 2016-03-30 DIAGNOSIS — Z5181 Encounter for therapeutic drug level monitoring: Secondary | ICD-10-CM

## 2016-04-04 LAB — CUP PACEART REMOTE DEVICE CHECK
Battery Impedance: 391 Ohm
Battery Remaining Longevity: 98 mo
Battery Voltage: 2.79 V
Brady Statistic AP VP Percent: 7 %
Brady Statistic AP VS Percent: 0 %
Brady Statistic AS VP Percent: 93 %
Brady Statistic AS VS Percent: 0 %
Date Time Interrogation Session: 20171025152731
Implantable Lead Implant Date: 19980518
Implantable Lead Implant Date: 20040301
Implantable Lead Location: 753859
Implantable Lead Location: 753860
Implantable Lead Model: 4269
Implantable Lead Model: 4285
Implantable Lead Serial Number: 249440
Implantable Lead Serial Number: 295540
Implantable Pulse Generator Implant Date: 20111206
Lead Channel Impedance Value: 633 Ohm
Lead Channel Impedance Value: 890 Ohm
Lead Channel Pacing Threshold Amplitude: 1.375 V
Lead Channel Pacing Threshold Amplitude: 1.5 V
Lead Channel Pacing Threshold Pulse Width: 0.4 ms
Lead Channel Pacing Threshold Pulse Width: 0.4 ms
Lead Channel Setting Pacing Amplitude: 2 V
Lead Channel Setting Pacing Amplitude: 2.5 V
Lead Channel Setting Pacing Pulse Width: 0.4 ms
Lead Channel Setting Sensing Sensitivity: 5.6 mV

## 2016-04-10 DIAGNOSIS — L570 Actinic keratosis: Secondary | ICD-10-CM | POA: Diagnosis not present

## 2016-04-10 DIAGNOSIS — D1801 Hemangioma of skin and subcutaneous tissue: Secondary | ICD-10-CM | POA: Diagnosis not present

## 2016-04-10 DIAGNOSIS — L821 Other seborrheic keratosis: Secondary | ICD-10-CM | POA: Diagnosis not present

## 2016-04-12 DIAGNOSIS — H353221 Exudative age-related macular degeneration, left eye, with active choroidal neovascularization: Secondary | ICD-10-CM | POA: Diagnosis not present

## 2016-04-16 DIAGNOSIS — H8110 Benign paroxysmal vertigo, unspecified ear: Secondary | ICD-10-CM | POA: Diagnosis not present

## 2016-04-16 DIAGNOSIS — H9202 Otalgia, left ear: Secondary | ICD-10-CM | POA: Diagnosis not present

## 2016-04-30 ENCOUNTER — Other Ambulatory Visit: Payer: Self-pay | Admitting: *Deleted

## 2016-04-30 MED ORDER — WARFARIN SODIUM 5 MG PO TABS: ORAL_TABLET | ORAL | 1 refills | Status: DC

## 2016-05-10 ENCOUNTER — Ambulatory Visit (INDEPENDENT_AMBULATORY_CARE_PROVIDER_SITE_OTHER): Payer: Medicare Other | Admitting: Internal Medicine

## 2016-05-10 DIAGNOSIS — Z5181 Encounter for therapeutic drug level monitoring: Secondary | ICD-10-CM

## 2016-05-10 DIAGNOSIS — I4891 Unspecified atrial fibrillation: Secondary | ICD-10-CM | POA: Diagnosis not present

## 2016-05-10 DIAGNOSIS — Z7901 Long term (current) use of anticoagulants: Secondary | ICD-10-CM | POA: Diagnosis not present

## 2016-05-10 LAB — PROTIME-INR: INR: 2.3 — AB (ref 0.9–1.1)

## 2016-05-23 DIAGNOSIS — M545 Low back pain: Secondary | ICD-10-CM | POA: Diagnosis not present

## 2016-05-23 DIAGNOSIS — M19071 Primary osteoarthritis, right ankle and foot: Secondary | ICD-10-CM | POA: Diagnosis not present

## 2016-05-23 DIAGNOSIS — M19072 Primary osteoarthritis, left ankle and foot: Secondary | ICD-10-CM | POA: Diagnosis not present

## 2016-05-23 DIAGNOSIS — M79672 Pain in left foot: Secondary | ICD-10-CM | POA: Diagnosis not present

## 2016-05-23 DIAGNOSIS — M25512 Pain in left shoulder: Secondary | ICD-10-CM | POA: Diagnosis not present

## 2016-05-23 DIAGNOSIS — M25551 Pain in right hip: Secondary | ICD-10-CM | POA: Diagnosis not present

## 2016-06-06 ENCOUNTER — Telehealth: Payer: Self-pay | Admitting: Cardiology

## 2016-06-06 ENCOUNTER — Ambulatory Visit (INDEPENDENT_AMBULATORY_CARE_PROVIDER_SITE_OTHER): Payer: Medicare Other | Admitting: *Deleted

## 2016-06-06 DIAGNOSIS — M1990 Unspecified osteoarthritis, unspecified site: Secondary | ICD-10-CM | POA: Diagnosis not present

## 2016-06-06 DIAGNOSIS — I442 Atrioventricular block, complete: Secondary | ICD-10-CM

## 2016-06-06 DIAGNOSIS — M79672 Pain in left foot: Secondary | ICD-10-CM | POA: Diagnosis not present

## 2016-06-06 DIAGNOSIS — M545 Low back pain: Secondary | ICD-10-CM | POA: Diagnosis not present

## 2016-06-06 DIAGNOSIS — M25512 Pain in left shoulder: Secondary | ICD-10-CM | POA: Diagnosis not present

## 2016-06-06 NOTE — Telephone Encounter (Signed)
Confirmed remote transmission w/ pt husband.   

## 2016-06-07 ENCOUNTER — Encounter: Payer: Self-pay | Admitting: Cardiology

## 2016-06-07 DIAGNOSIS — H353221 Exudative age-related macular degeneration, left eye, with active choroidal neovascularization: Secondary | ICD-10-CM | POA: Diagnosis not present

## 2016-06-07 LAB — CUP PACEART REMOTE DEVICE CHECK
Battery Impedance: 439 Ohm
Battery Remaining Longevity: 93 mo
Battery Voltage: 2.78 V
Brady Statistic AP VP Percent: 6 %
Brady Statistic AP VS Percent: 0 %
Brady Statistic AS VP Percent: 94 %
Brady Statistic AS VS Percent: 0 %
Date Time Interrogation Session: 20180124155350
Implantable Lead Implant Date: 19980518
Implantable Lead Implant Date: 20040301
Implantable Lead Location: 753859
Implantable Lead Location: 753860
Implantable Lead Model: 4269
Implantable Lead Model: 4285
Implantable Lead Serial Number: 249440
Implantable Lead Serial Number: 295540
Implantable Pulse Generator Implant Date: 20111206
Lead Channel Impedance Value: 612 Ohm
Lead Channel Impedance Value: 887 Ohm
Lead Channel Pacing Threshold Amplitude: 1.125 V
Lead Channel Pacing Threshold Amplitude: 1.5 V
Lead Channel Pacing Threshold Pulse Width: 0.4 ms
Lead Channel Pacing Threshold Pulse Width: 0.4 ms
Lead Channel Setting Pacing Amplitude: 2 V
Lead Channel Setting Pacing Amplitude: 2.5 V
Lead Channel Setting Pacing Pulse Width: 0.4 ms
Lead Channel Setting Sensing Sensitivity: 5.6 mV

## 2016-06-07 NOTE — Progress Notes (Signed)
Remote pacemaker transmission.   

## 2016-06-21 ENCOUNTER — Ambulatory Visit (INDEPENDENT_AMBULATORY_CARE_PROVIDER_SITE_OTHER): Payer: Medicare Other | Admitting: Cardiovascular Disease

## 2016-06-21 DIAGNOSIS — I4891 Unspecified atrial fibrillation: Secondary | ICD-10-CM

## 2016-06-21 DIAGNOSIS — Z5181 Encounter for therapeutic drug level monitoring: Secondary | ICD-10-CM

## 2016-06-21 DIAGNOSIS — Z7901 Long term (current) use of anticoagulants: Secondary | ICD-10-CM | POA: Diagnosis not present

## 2016-06-21 LAB — PROTIME-INR: INR: 2.5 — AB (ref 0.9–1.1)

## 2016-07-12 ENCOUNTER — Ambulatory Visit (INDEPENDENT_AMBULATORY_CARE_PROVIDER_SITE_OTHER): Payer: Self-pay | Admitting: Specialist

## 2016-07-12 DIAGNOSIS — G253 Myoclonus: Secondary | ICD-10-CM | POA: Diagnosis not present

## 2016-07-12 DIAGNOSIS — F419 Anxiety disorder, unspecified: Secondary | ICD-10-CM | POA: Diagnosis not present

## 2016-07-12 DIAGNOSIS — F09 Unspecified mental disorder due to known physiological condition: Secondary | ICD-10-CM | POA: Diagnosis not present

## 2016-07-13 ENCOUNTER — Encounter (INDEPENDENT_AMBULATORY_CARE_PROVIDER_SITE_OTHER): Payer: Self-pay | Admitting: Specialist

## 2016-07-13 ENCOUNTER — Ambulatory Visit (INDEPENDENT_AMBULATORY_CARE_PROVIDER_SITE_OTHER): Payer: Medicare Other | Admitting: Specialist

## 2016-07-13 ENCOUNTER — Encounter (INDEPENDENT_AMBULATORY_CARE_PROVIDER_SITE_OTHER): Payer: Self-pay

## 2016-07-13 VITALS — BP 109/62 | HR 70 | Ht 60.75 in | Wt 144.0 lb

## 2016-07-13 DIAGNOSIS — M47816 Spondylosis without myelopathy or radiculopathy, lumbar region: Secondary | ICD-10-CM | POA: Diagnosis not present

## 2016-07-13 DIAGNOSIS — M5136 Other intervertebral disc degeneration, lumbar region: Secondary | ICD-10-CM

## 2016-07-13 NOTE — Progress Notes (Signed)
Office Visit Note   Patient: [name]           Date of Birth: 07/21/1922           MRN: [id number] Visit Date: 07/13/2016              Requested by: Nicole Sheller, MD [address] [place], [place] [address] PCP: Nicole Sheller, MD   Assessment & Plan: Visit Diagnoses:  1. DDD (degenerative disc disease), lumbar   2. Spondylosis without myelopathy or radiculopathy, lumbar region     Plan: Avoid frequent bending and stooping  No lifting greater than 10 lbs. May use ice or moist heat for pain. Weight loss is of benefit. Physical therapy to improve core muscle strength and balance and coordination    Follow-Up Instructions: Return in about 6 weeks (around 08/24/2016).   Orders:  Orders Placed This Encounter  Procedures  . Ambulatory referral to Physical Therapy   No orders of the defined types were placed in this encounter.     Procedures: No procedures performed   Clinical Data: No additional findings.   Subjective: Chief Complaint  Patient presents with  . Lower Back - Pain    [name] was referred to Dr. Louanne Watts by Dr. Kathlene [facility] at [name].  She states that she is having low back pain and pain into the left hip.  She states that she had injection into the left hip bursa and it is doing better but it still has pain in it.  She states that there has not been an injury. She did bring her [facility] that [facility] Has pain 5-6 of 10 transverse it the T-L junction.  Able to grocery shop, sitting does not improve the pain. Injection of the hip helped.     Review of Systems  Constitutional: Negative.   HENT: Negative.   Eyes: Negative.   Respiratory: Negative.   Cardiovascular: Negative.   Gastrointestinal: Negative.   Endocrine: Negative.   Genitourinary: Negative.   Musculoskeletal: Negative.   Skin: Negative.   Allergic/Immunologic: Negative.   Neurological: Negative.   Hematological:  Negative.   Psychiatric/Behavioral: Negative.      Objective: Vital Signs: BP 109/62 (BP Location: Left Arm, Patient Position: Sitting)   Pulse 70   Ht 5' 0.75" (1.543 m)   Wt 144 lb (65.3 kg)   BMI 27.43 kg/m   Physical Exam  Constitutional: She is oriented to person, place, and time. She appears well-developed and well-nourished.  HENT:  Head: Normocephalic and atraumatic.  Eyes: EOM are normal. Pupils are equal, round, and reactive to light.  Neck: Normal range of motion. Neck supple.  Pulmonary/Chest: Effort normal and breath sounds normal.  Abdominal: Soft. Bowel sounds are normal.  Musculoskeletal: Normal range of motion.  Neurological: She is alert and oriented to person, place, and time.  Skin: Skin is warm and dry.  Psychiatric: She has a normal mood and affect. Her behavior is normal. Judgment and thought content normal.    Ortho Exam  Specialty Comments:  No specialty comments available.  Imaging: No results found.   PMFS History: Patient Active Problem List   Diagnosis Date Noted  . Encounter for therapeutic drug monitoring 11/23/2013  . Pacemaker-Medtronic 03/11/2012  . Edema 09/28/2011  . Long term (current) use of anticoagulants 05/23/2011  . Atypical chest pain 02/12/2011  . Fatigue 02/12/2011  . Rhinitis, chronic 01/17/2011  . Bronchitis, chronic ([place]) 12/15/2010  . Back pain 11/06/2010  .  Hypotension 11/06/2010  . PAROXYSMAL ATRIAL FIBRILLATION 07/26/2010  . ATRIOVENTRICULAR BLOCK, 3RD DEGREE 04/12/2010  . DYSPHAGIA 12/14/2008   Past Medical History:  Diagnosis Date  . Arthritis   . Atrial fibrillation ([place])   . Dementia   . Depression   . DVT (deep venous thrombosis) ([place]) 2011  . HB (heart block)    s/p PPM, most recent generator change 12/11 by JA  . Hiatal hernia   . Hyperlipidemia   . Hypothyroidism   . PTE (post-transplant erythrocytosis)   . Pulmonary embolism ([place]) 2011   on coumadin  . UTI (lower urinary tract infection)       Family History  Problem Relation Age of Onset  . Stomach cancer Brother   . Stomach cancer Brother   . Liver cancer Brother   . Stroke Father   . Heart disease Father   . Arthritis Father   . Breast cancer Sister   . Heart disease Mother   . Arthritis Mother     Past Surgical History:  Procedure Laterality Date  . CHOLECYSTECTOMY  09/2010  . HEMORRHOID SURGERY    . hysterectomy (other)    . JOINT REPLACEMENT    . pacemaker     most recent generator 12/11 by JA  . TOTAL KNEE ARTHROPLASTY Left    Social History   Occupational History  . retired Radiation protection practitioner    Social History Main Topics  . Smoking status: Never Smoker  . Smokeless tobacco: Never Used  . Alcohol use No  . Drug use: No  . Sexual activity: No

## 2016-07-13 NOTE — Patient Instructions (Signed)
Avoid frequent bending and stooping  No lifting greater than 10 lbs. May use ice or moist heat for pain. Weight loss is of benefit. Physical therapy to improve core muscle strength and balance and coordination

## 2016-07-16 ENCOUNTER — Telehealth: Payer: Self-pay | Admitting: Pharmacist

## 2016-07-16 NOTE — Telephone Encounter (Signed)
Pt's husband called to report that pt will be titrated off citalopram and started on Sertraline. Advised that these changes should not influence Coumadin. Will keep INR check as scheduled for Thursday.

## 2016-07-19 DIAGNOSIS — R293 Abnormal posture: Secondary | ICD-10-CM | POA: Diagnosis not present

## 2016-07-19 DIAGNOSIS — R2681 Unsteadiness on feet: Secondary | ICD-10-CM | POA: Diagnosis not present

## 2016-07-19 DIAGNOSIS — M6281 Muscle weakness (generalized): Secondary | ICD-10-CM | POA: Diagnosis not present

## 2016-07-19 DIAGNOSIS — M25551 Pain in right hip: Secondary | ICD-10-CM | POA: Diagnosis not present

## 2016-07-19 DIAGNOSIS — M545 Low back pain: Secondary | ICD-10-CM | POA: Diagnosis not present

## 2016-07-19 DIAGNOSIS — R260 Ataxic gait: Secondary | ICD-10-CM | POA: Diagnosis not present

## 2016-07-25 ENCOUNTER — Ambulatory Visit: Payer: Medicare Other | Admitting: Physical Therapy

## 2016-07-30 ENCOUNTER — Ambulatory Visit (INDEPENDENT_AMBULATORY_CARE_PROVIDER_SITE_OTHER): Payer: Medicare Other | Admitting: *Deleted

## 2016-07-30 DIAGNOSIS — M6281 Muscle weakness (generalized): Secondary | ICD-10-CM | POA: Diagnosis not present

## 2016-07-30 DIAGNOSIS — R260 Ataxic gait: Secondary | ICD-10-CM | POA: Diagnosis not present

## 2016-07-30 DIAGNOSIS — M545 Low back pain: Secondary | ICD-10-CM | POA: Diagnosis not present

## 2016-07-30 DIAGNOSIS — Z5181 Encounter for therapeutic drug level monitoring: Secondary | ICD-10-CM | POA: Diagnosis not present

## 2016-07-30 DIAGNOSIS — R293 Abnormal posture: Secondary | ICD-10-CM | POA: Diagnosis not present

## 2016-07-30 DIAGNOSIS — I4891 Unspecified atrial fibrillation: Secondary | ICD-10-CM | POA: Diagnosis not present

## 2016-07-30 DIAGNOSIS — R2681 Unsteadiness on feet: Secondary | ICD-10-CM | POA: Diagnosis not present

## 2016-07-30 DIAGNOSIS — M25551 Pain in right hip: Secondary | ICD-10-CM | POA: Diagnosis not present

## 2016-07-30 LAB — POCT INR: INR: 6.3

## 2016-07-30 LAB — PROTIME-INR
INR: 5.7 — ABNORMAL HIGH (ref 0.8–1.2)
Prothrombin Time: 56.2 s — ABNORMAL HIGH (ref 9.1–12.0)

## 2016-08-01 DIAGNOSIS — R2681 Unsteadiness on feet: Secondary | ICD-10-CM | POA: Diagnosis not present

## 2016-08-01 DIAGNOSIS — M6281 Muscle weakness (generalized): Secondary | ICD-10-CM | POA: Diagnosis not present

## 2016-08-01 DIAGNOSIS — R260 Ataxic gait: Secondary | ICD-10-CM | POA: Diagnosis not present

## 2016-08-01 DIAGNOSIS — M25551 Pain in right hip: Secondary | ICD-10-CM | POA: Diagnosis not present

## 2016-08-01 DIAGNOSIS — R293 Abnormal posture: Secondary | ICD-10-CM | POA: Diagnosis not present

## 2016-08-01 DIAGNOSIS — M545 Low back pain: Secondary | ICD-10-CM | POA: Diagnosis not present

## 2016-08-02 ENCOUNTER — Ambulatory Visit (INDEPENDENT_AMBULATORY_CARE_PROVIDER_SITE_OTHER): Payer: Medicare Other | Admitting: Cardiology

## 2016-08-02 DIAGNOSIS — Z5181 Encounter for therapeutic drug level monitoring: Secondary | ICD-10-CM

## 2016-08-02 DIAGNOSIS — I4891 Unspecified atrial fibrillation: Secondary | ICD-10-CM

## 2016-08-02 DIAGNOSIS — H353221 Exudative age-related macular degeneration, left eye, with active choroidal neovascularization: Secondary | ICD-10-CM | POA: Diagnosis not present

## 2016-08-02 DIAGNOSIS — Z7901 Long term (current) use of anticoagulants: Secondary | ICD-10-CM | POA: Diagnosis not present

## 2016-08-02 LAB — PROTIME-INR: INR: 3.4 — AB (ref 0.9–1.1)

## 2016-08-03 DIAGNOSIS — R293 Abnormal posture: Secondary | ICD-10-CM | POA: Diagnosis not present

## 2016-08-03 DIAGNOSIS — R2681 Unsteadiness on feet: Secondary | ICD-10-CM | POA: Diagnosis not present

## 2016-08-03 DIAGNOSIS — M25551 Pain in right hip: Secondary | ICD-10-CM | POA: Diagnosis not present

## 2016-08-03 DIAGNOSIS — M6281 Muscle weakness (generalized): Secondary | ICD-10-CM | POA: Diagnosis not present

## 2016-08-03 DIAGNOSIS — R260 Ataxic gait: Secondary | ICD-10-CM | POA: Diagnosis not present

## 2016-08-03 DIAGNOSIS — M545 Low back pain: Secondary | ICD-10-CM | POA: Diagnosis not present

## 2016-08-06 ENCOUNTER — Ambulatory Visit (INDEPENDENT_AMBULATORY_CARE_PROVIDER_SITE_OTHER): Payer: Medicare Other | Admitting: Cardiovascular Disease

## 2016-08-06 DIAGNOSIS — M25551 Pain in right hip: Secondary | ICD-10-CM | POA: Diagnosis not present

## 2016-08-06 DIAGNOSIS — R293 Abnormal posture: Secondary | ICD-10-CM | POA: Diagnosis not present

## 2016-08-06 DIAGNOSIS — Z5181 Encounter for therapeutic drug level monitoring: Secondary | ICD-10-CM

## 2016-08-06 DIAGNOSIS — Z7901 Long term (current) use of anticoagulants: Secondary | ICD-10-CM | POA: Diagnosis not present

## 2016-08-06 DIAGNOSIS — I4891 Unspecified atrial fibrillation: Secondary | ICD-10-CM | POA: Diagnosis not present

## 2016-08-06 DIAGNOSIS — R260 Ataxic gait: Secondary | ICD-10-CM | POA: Diagnosis not present

## 2016-08-06 DIAGNOSIS — M6281 Muscle weakness (generalized): Secondary | ICD-10-CM | POA: Diagnosis not present

## 2016-08-06 DIAGNOSIS — M545 Low back pain: Secondary | ICD-10-CM | POA: Diagnosis not present

## 2016-08-06 DIAGNOSIS — R2681 Unsteadiness on feet: Secondary | ICD-10-CM | POA: Diagnosis not present

## 2016-08-06 LAB — PROTIME-INR: INR: 2.2 — AB (ref 0.9–1.1)

## 2016-08-09 ENCOUNTER — Ambulatory Visit (INDEPENDENT_AMBULATORY_CARE_PROVIDER_SITE_OTHER): Payer: Medicare Other | Admitting: Cardiology

## 2016-08-09 ENCOUNTER — Telehealth: Payer: Self-pay | Admitting: *Deleted

## 2016-08-09 DIAGNOSIS — R2681 Unsteadiness on feet: Secondary | ICD-10-CM | POA: Diagnosis not present

## 2016-08-09 DIAGNOSIS — Z5181 Encounter for therapeutic drug level monitoring: Secondary | ICD-10-CM

## 2016-08-09 DIAGNOSIS — Z7901 Long term (current) use of anticoagulants: Secondary | ICD-10-CM | POA: Diagnosis not present

## 2016-08-09 DIAGNOSIS — M25551 Pain in right hip: Secondary | ICD-10-CM | POA: Diagnosis not present

## 2016-08-09 DIAGNOSIS — M545 Low back pain: Secondary | ICD-10-CM | POA: Diagnosis not present

## 2016-08-09 DIAGNOSIS — R260 Ataxic gait: Secondary | ICD-10-CM | POA: Diagnosis not present

## 2016-08-09 DIAGNOSIS — I4891 Unspecified atrial fibrillation: Secondary | ICD-10-CM

## 2016-08-09 DIAGNOSIS — M6281 Muscle weakness (generalized): Secondary | ICD-10-CM | POA: Diagnosis not present

## 2016-08-09 DIAGNOSIS — R293 Abnormal posture: Secondary | ICD-10-CM | POA: Diagnosis not present

## 2016-08-09 LAB — PROTIME-INR: INR: 2.8 — AB (ref 0.9–1.1)

## 2016-08-09 NOTE — Telephone Encounter (Signed)
Spoke with [name] at Dr Junita Push office and she states that the dental procedure [name] is scheduled for she does not need to hold coumadin for this

## 2016-08-15 DIAGNOSIS — R293 Abnormal posture: Secondary | ICD-10-CM | POA: Diagnosis not present

## 2016-08-15 DIAGNOSIS — M542 Cervicalgia: Secondary | ICD-10-CM | POA: Diagnosis not present

## 2016-08-15 DIAGNOSIS — M6281 Muscle weakness (generalized): Secondary | ICD-10-CM | POA: Diagnosis not present

## 2016-08-15 DIAGNOSIS — R260 Ataxic gait: Secondary | ICD-10-CM | POA: Diagnosis not present

## 2016-08-15 DIAGNOSIS — M545 Low back pain: Secondary | ICD-10-CM | POA: Diagnosis not present

## 2016-08-16 ENCOUNTER — Ambulatory Visit (INDEPENDENT_AMBULATORY_CARE_PROVIDER_SITE_OTHER): Payer: Medicare Other | Admitting: Cardiology

## 2016-08-16 DIAGNOSIS — M542 Cervicalgia: Secondary | ICD-10-CM | POA: Diagnosis not present

## 2016-08-16 DIAGNOSIS — I4891 Unspecified atrial fibrillation: Secondary | ICD-10-CM | POA: Diagnosis not present

## 2016-08-16 DIAGNOSIS — M6281 Muscle weakness (generalized): Secondary | ICD-10-CM | POA: Diagnosis not present

## 2016-08-16 DIAGNOSIS — R260 Ataxic gait: Secondary | ICD-10-CM | POA: Diagnosis not present

## 2016-08-16 DIAGNOSIS — M545 Low back pain: Secondary | ICD-10-CM | POA: Diagnosis not present

## 2016-08-16 DIAGNOSIS — Z5181 Encounter for therapeutic drug level monitoring: Secondary | ICD-10-CM

## 2016-08-16 DIAGNOSIS — Z7901 Long term (current) use of anticoagulants: Secondary | ICD-10-CM | POA: Diagnosis not present

## 2016-08-16 DIAGNOSIS — R293 Abnormal posture: Secondary | ICD-10-CM | POA: Diagnosis not present

## 2016-08-16 LAB — PROTIME-INR: INR: 3.9 — AB (ref 0.9–1.1)

## 2016-08-17 DIAGNOSIS — M19072 Primary osteoarthritis, left ankle and foot: Secondary | ICD-10-CM | POA: Diagnosis not present

## 2016-08-17 DIAGNOSIS — M216X2 Other acquired deformities of left foot: Secondary | ICD-10-CM | POA: Diagnosis not present

## 2016-08-20 DIAGNOSIS — M545 Low back pain: Secondary | ICD-10-CM | POA: Diagnosis not present

## 2016-08-20 DIAGNOSIS — R260 Ataxic gait: Secondary | ICD-10-CM | POA: Diagnosis not present

## 2016-08-20 DIAGNOSIS — R293 Abnormal posture: Secondary | ICD-10-CM | POA: Diagnosis not present

## 2016-08-20 DIAGNOSIS — M6281 Muscle weakness (generalized): Secondary | ICD-10-CM | POA: Diagnosis not present

## 2016-08-20 DIAGNOSIS — M542 Cervicalgia: Secondary | ICD-10-CM | POA: Diagnosis not present

## 2016-08-21 DIAGNOSIS — E559 Vitamin D deficiency, unspecified: Secondary | ICD-10-CM | POA: Diagnosis not present

## 2016-08-21 DIAGNOSIS — M79672 Pain in left foot: Secondary | ICD-10-CM | POA: Diagnosis not present

## 2016-08-21 DIAGNOSIS — E785 Hyperlipidemia, unspecified: Secondary | ICD-10-CM | POA: Diagnosis not present

## 2016-08-21 DIAGNOSIS — M25512 Pain in left shoulder: Secondary | ICD-10-CM | POA: Diagnosis not present

## 2016-08-21 DIAGNOSIS — I5032 Chronic diastolic (congestive) heart failure: Secondary | ICD-10-CM | POA: Diagnosis not present

## 2016-08-21 DIAGNOSIS — M810 Age-related osteoporosis without current pathological fracture: Secondary | ICD-10-CM | POA: Diagnosis not present

## 2016-08-21 DIAGNOSIS — M1990 Unspecified osteoarthritis, unspecified site: Secondary | ICD-10-CM | POA: Diagnosis not present

## 2016-08-21 DIAGNOSIS — M545 Low back pain: Secondary | ICD-10-CM | POA: Diagnosis not present

## 2016-08-21 DIAGNOSIS — E039 Hypothyroidism, unspecified: Secondary | ICD-10-CM | POA: Diagnosis not present

## 2016-08-21 LAB — MAGNESIUM: Magnesium: 2

## 2016-08-21 LAB — VITAMIN D 25 HYDROXY (VIT D DEFICIENCY, FRACTURES): Vitamin D, 25-Hydroxy: 57

## 2016-08-22 DIAGNOSIS — R260 Ataxic gait: Secondary | ICD-10-CM | POA: Diagnosis not present

## 2016-08-22 DIAGNOSIS — M542 Cervicalgia: Secondary | ICD-10-CM | POA: Diagnosis not present

## 2016-08-22 DIAGNOSIS — M545 Low back pain: Secondary | ICD-10-CM | POA: Diagnosis not present

## 2016-08-22 DIAGNOSIS — R293 Abnormal posture: Secondary | ICD-10-CM | POA: Diagnosis not present

## 2016-08-22 DIAGNOSIS — M6281 Muscle weakness (generalized): Secondary | ICD-10-CM | POA: Diagnosis not present

## 2016-08-23 ENCOUNTER — Ambulatory Visit (INDEPENDENT_AMBULATORY_CARE_PROVIDER_SITE_OTHER): Payer: Medicare Other | Admitting: Cardiovascular Disease

## 2016-08-23 DIAGNOSIS — M545 Low back pain: Secondary | ICD-10-CM | POA: Diagnosis not present

## 2016-08-23 DIAGNOSIS — I4891 Unspecified atrial fibrillation: Secondary | ICD-10-CM | POA: Diagnosis not present

## 2016-08-23 DIAGNOSIS — R260 Ataxic gait: Secondary | ICD-10-CM | POA: Diagnosis not present

## 2016-08-23 DIAGNOSIS — R293 Abnormal posture: Secondary | ICD-10-CM | POA: Diagnosis not present

## 2016-08-23 DIAGNOSIS — M542 Cervicalgia: Secondary | ICD-10-CM | POA: Diagnosis not present

## 2016-08-23 DIAGNOSIS — M6281 Muscle weakness (generalized): Secondary | ICD-10-CM | POA: Diagnosis not present

## 2016-08-23 DIAGNOSIS — Z7901 Long term (current) use of anticoagulants: Secondary | ICD-10-CM | POA: Diagnosis not present

## 2016-08-23 DIAGNOSIS — Z5181 Encounter for therapeutic drug level monitoring: Secondary | ICD-10-CM

## 2016-08-23 LAB — POCT INR: INR: 2.4

## 2016-08-23 LAB — PROTIME-INR: INR: 2.4 — AB (ref 0.9–1.1)

## 2016-08-28 ENCOUNTER — Telehealth: Payer: Self-pay | Admitting: *Deleted

## 2016-08-28 DIAGNOSIS — E559 Vitamin D deficiency, unspecified: Secondary | ICD-10-CM | POA: Diagnosis not present

## 2016-08-28 DIAGNOSIS — E7800 Pure hypercholesterolemia, unspecified: Secondary | ICD-10-CM | POA: Diagnosis not present

## 2016-08-28 DIAGNOSIS — K219 Gastro-esophageal reflux disease without esophagitis: Secondary | ICD-10-CM | POA: Diagnosis not present

## 2016-08-28 DIAGNOSIS — F329 Major depressive disorder, single episode, unspecified: Secondary | ICD-10-CM | POA: Diagnosis not present

## 2016-08-28 NOTE — Telephone Encounter (Signed)
Band called to notify [facility] that the pt was prescribed Amoxocillin 500mg  & will take 1 every 8 hours post dental procedure; total of 15 tabs per husband. Advised that the med does not intefere with Coumadin & is safe to take. He stated they will be going to PCP for more meds to counter act getting a yeast infections advised to call back with new med if given any because most meds that are given for yeast infections interfere with Coumadin & he verbalized understanding.

## 2016-09-05 ENCOUNTER — Ambulatory Visit (INDEPENDENT_AMBULATORY_CARE_PROVIDER_SITE_OTHER): Payer: Medicare Other | Admitting: *Deleted

## 2016-09-05 DIAGNOSIS — I442 Atrioventricular block, complete: Secondary | ICD-10-CM | POA: Diagnosis not present

## 2016-09-05 NOTE — Progress Notes (Signed)
Remote pacemaker transmission.   

## 2016-09-06 LAB — CUP PACEART REMOTE DEVICE CHECK
Battery Impedance: 487 Ohm
Battery Remaining Longevity: 89 mo
Battery Voltage: 2.79 V
Brady Statistic AP VP Percent: 6 %
Brady Statistic AP VS Percent: 0 %
Brady Statistic AS VP Percent: 94 %
Brady Statistic AS VS Percent: 0 %
Date Time Interrogation Session: 20180425123224
Implantable Lead Implant Date: 19980518
Implantable Lead Implant Date: 20040301
Implantable Lead Location: 753859
Implantable Lead Location: 753860
Implantable Lead Model: 4269
Implantable Lead Model: 4285
Implantable Lead Serial Number: 249440
Implantable Lead Serial Number: 295540
Implantable Pulse Generator Implant Date: 20111206
Lead Channel Impedance Value: 622 Ohm
Lead Channel Impedance Value: 885 Ohm
Lead Channel Pacing Threshold Amplitude: 1.625 V
Lead Channel Pacing Threshold Amplitude: 1.625 V
Lead Channel Pacing Threshold Pulse Width: 0.4 ms
Lead Channel Pacing Threshold Pulse Width: 0.4 ms
Lead Channel Setting Pacing Amplitude: 2 V
Lead Channel Setting Pacing Amplitude: 2.5 V
Lead Channel Setting Pacing Pulse Width: 0.4 ms
Lead Channel Setting Sensing Sensitivity: 5.6 mV

## 2016-09-07 ENCOUNTER — Encounter: Payer: Self-pay | Admitting: Cardiology

## 2016-09-10 ENCOUNTER — Ambulatory Visit (INDEPENDENT_AMBULATORY_CARE_PROVIDER_SITE_OTHER): Payer: Medicare Other

## 2016-09-10 DIAGNOSIS — I4891 Unspecified atrial fibrillation: Secondary | ICD-10-CM | POA: Diagnosis not present

## 2016-09-10 DIAGNOSIS — Z5181 Encounter for therapeutic drug level monitoring: Secondary | ICD-10-CM

## 2016-09-10 LAB — PROTIME-INR: INR: 5 — AB (ref 0.9–1.1)

## 2016-09-13 DIAGNOSIS — H353221 Exudative age-related macular degeneration, left eye, with active choroidal neovascularization: Secondary | ICD-10-CM | POA: Diagnosis not present

## 2016-09-17 ENCOUNTER — Ambulatory Visit (INDEPENDENT_AMBULATORY_CARE_PROVIDER_SITE_OTHER): Payer: Medicare Other | Admitting: Cardiovascular Disease

## 2016-09-17 DIAGNOSIS — Z5181 Encounter for therapeutic drug level monitoring: Secondary | ICD-10-CM

## 2016-09-17 DIAGNOSIS — I4891 Unspecified atrial fibrillation: Secondary | ICD-10-CM | POA: Diagnosis not present

## 2016-09-17 LAB — PROTIME-INR: INR: 3.6 — AB (ref 0.9–1.1)

## 2016-09-24 ENCOUNTER — Ambulatory Visit (INDEPENDENT_AMBULATORY_CARE_PROVIDER_SITE_OTHER): Payer: Medicare Other | Admitting: Interventional Cardiology

## 2016-09-24 DIAGNOSIS — I4891 Unspecified atrial fibrillation: Secondary | ICD-10-CM

## 2016-09-24 DIAGNOSIS — Z5181 Encounter for therapeutic drug level monitoring: Secondary | ICD-10-CM

## 2016-09-24 LAB — PROTIME-INR: INR: 2.8 — AB (ref 0.9–1.1)

## 2016-09-27 DIAGNOSIS — M810 Age-related osteoporosis without current pathological fracture: Secondary | ICD-10-CM | POA: Diagnosis not present

## 2016-09-27 DIAGNOSIS — N390 Urinary tract infection, site not specified: Secondary | ICD-10-CM | POA: Diagnosis not present

## 2016-09-27 DIAGNOSIS — N3281 Overactive bladder: Secondary | ICD-10-CM | POA: Diagnosis not present

## 2016-09-27 DIAGNOSIS — G629 Polyneuropathy, unspecified: Secondary | ICD-10-CM | POA: Diagnosis not present

## 2016-10-01 ENCOUNTER — Telehealth: Payer: Self-pay | Admitting: *Deleted

## 2016-10-01 NOTE — Telephone Encounter (Signed)
Spoke with pt's husband and he states that she has another UTI and is being placed on antibiotic. This nurse called [employer]  [phone] and they state she was ordered Nitrofurantoin 100 mg bid for 7 days This nurse spoke with Mr Fulwider again and informed him that there is no interaction between coumadin and Nirtrofurantoin and  to take coumadin as instructed and Nitrofurantoin as instructed and to keep her scheduled appt to have INR checked on May 31st and he states understanding. Mr Duquette states that she was also ordered  something for incontinence but was going to cost 600 dollars so at present he states they are not going to start this

## 2016-10-11 ENCOUNTER — Ambulatory Visit (INDEPENDENT_AMBULATORY_CARE_PROVIDER_SITE_OTHER): Payer: Medicare Other | Admitting: Cardiovascular Disease

## 2016-10-11 DIAGNOSIS — Z5181 Encounter for therapeutic drug level monitoring: Secondary | ICD-10-CM | POA: Diagnosis not present

## 2016-10-11 DIAGNOSIS — I4891 Unspecified atrial fibrillation: Secondary | ICD-10-CM

## 2016-10-11 LAB — PROTIME-INR: INR: 2.6 — AB (ref 0.9–1.1)

## 2016-10-16 DIAGNOSIS — M7060 Trochanteric bursitis, unspecified hip: Secondary | ICD-10-CM | POA: Diagnosis not present

## 2016-10-16 DIAGNOSIS — M1990 Unspecified osteoarthritis, unspecified site: Secondary | ICD-10-CM | POA: Diagnosis not present

## 2016-10-16 DIAGNOSIS — M545 Low back pain: Secondary | ICD-10-CM | POA: Diagnosis not present

## 2016-10-16 DIAGNOSIS — M25512 Pain in left shoulder: Secondary | ICD-10-CM | POA: Diagnosis not present

## 2016-10-18 DIAGNOSIS — H6121 Impacted cerumen, right ear: Secondary | ICD-10-CM | POA: Diagnosis not present

## 2016-10-25 DIAGNOSIS — H353221 Exudative age-related macular degeneration, left eye, with active choroidal neovascularization: Secondary | ICD-10-CM | POA: Diagnosis not present

## 2016-11-01 DIAGNOSIS — H6121 Impacted cerumen, right ear: Secondary | ICD-10-CM | POA: Diagnosis not present

## 2016-11-08 ENCOUNTER — Ambulatory Visit (INDEPENDENT_AMBULATORY_CARE_PROVIDER_SITE_OTHER): Payer: Medicare Other | Admitting: Internal Medicine

## 2016-11-08 DIAGNOSIS — I4891 Unspecified atrial fibrillation: Secondary | ICD-10-CM

## 2016-11-08 DIAGNOSIS — Z7901 Long term (current) use of anticoagulants: Secondary | ICD-10-CM | POA: Diagnosis not present

## 2016-11-08 DIAGNOSIS — I5041 Acute combined systolic (congestive) and diastolic (congestive) heart failure: Secondary | ICD-10-CM | POA: Diagnosis not present

## 2016-11-08 DIAGNOSIS — Z5181 Encounter for therapeutic drug level monitoring: Secondary | ICD-10-CM

## 2016-11-08 LAB — PROTIME-INR: INR: 2.4 — AB (ref 0.9–1.1)

## 2016-11-27 DIAGNOSIS — H04123 Dry eye syndrome of bilateral lacrimal glands: Secondary | ICD-10-CM | POA: Diagnosis not present

## 2016-11-27 DIAGNOSIS — H353221 Exudative age-related macular degeneration, left eye, with active choroidal neovascularization: Secondary | ICD-10-CM | POA: Diagnosis not present

## 2016-11-27 DIAGNOSIS — H52203 Unspecified astigmatism, bilateral: Secondary | ICD-10-CM | POA: Diagnosis not present

## 2016-11-27 DIAGNOSIS — H353112 Nonexudative age-related macular degeneration, right eye, intermediate dry stage: Secondary | ICD-10-CM | POA: Diagnosis not present

## 2016-11-29 DIAGNOSIS — F419 Anxiety disorder, unspecified: Secondary | ICD-10-CM | POA: Diagnosis not present

## 2016-11-29 DIAGNOSIS — F09 Unspecified mental disorder due to known physiological condition: Secondary | ICD-10-CM | POA: Diagnosis not present

## 2016-11-29 DIAGNOSIS — G253 Myoclonus: Secondary | ICD-10-CM | POA: Diagnosis not present

## 2016-12-05 ENCOUNTER — Ambulatory Visit (INDEPENDENT_AMBULATORY_CARE_PROVIDER_SITE_OTHER): Payer: Medicare Other | Admitting: *Deleted

## 2016-12-05 DIAGNOSIS — I442 Atrioventricular block, complete: Secondary | ICD-10-CM | POA: Diagnosis not present

## 2016-12-05 NOTE — Progress Notes (Signed)
Remote pacemaker transmission.   

## 2016-12-06 ENCOUNTER — Encounter: Payer: Self-pay | Admitting: Cardiology

## 2016-12-06 DIAGNOSIS — H353221 Exudative age-related macular degeneration, left eye, with active choroidal neovascularization: Secondary | ICD-10-CM | POA: Diagnosis not present

## 2016-12-11 DIAGNOSIS — M7060 Trochanteric bursitis, unspecified hip: Secondary | ICD-10-CM | POA: Diagnosis not present

## 2016-12-11 DIAGNOSIS — M1990 Unspecified osteoarthritis, unspecified site: Secondary | ICD-10-CM | POA: Diagnosis not present

## 2016-12-11 DIAGNOSIS — M545 Low back pain: Secondary | ICD-10-CM | POA: Diagnosis not present

## 2016-12-11 DIAGNOSIS — M25512 Pain in left shoulder: Secondary | ICD-10-CM | POA: Diagnosis not present

## 2016-12-13 ENCOUNTER — Ambulatory Visit (INDEPENDENT_AMBULATORY_CARE_PROVIDER_SITE_OTHER): Payer: Medicare Other | Admitting: Cardiology

## 2016-12-13 DIAGNOSIS — I4891 Unspecified atrial fibrillation: Secondary | ICD-10-CM

## 2016-12-13 DIAGNOSIS — Z7901 Long term (current) use of anticoagulants: Secondary | ICD-10-CM | POA: Diagnosis not present

## 2016-12-13 DIAGNOSIS — Z5181 Encounter for therapeutic drug level monitoring: Secondary | ICD-10-CM

## 2016-12-13 DIAGNOSIS — I5032 Chronic diastolic (congestive) heart failure: Secondary | ICD-10-CM | POA: Diagnosis not present

## 2016-12-13 LAB — PROTIME-INR: INR: 2.8 — AB (ref 0.9–1.1)

## 2016-12-17 ENCOUNTER — Other Ambulatory Visit: Payer: Self-pay | Admitting: *Deleted

## 2016-12-17 MED ORDER — WARFARIN SODIUM 5 MG PO TABS: ORAL_TABLET | ORAL | 1 refills | Status: DC

## 2017-01-01 LAB — CUP PACEART REMOTE DEVICE CHECK
Battery Impedance: 487 Ohm
Battery Remaining Longevity: 89 mo
Battery Voltage: 2.79 V
Brady Statistic AP VP Percent: 5 %
Brady Statistic AP VS Percent: 0 %
Brady Statistic AS VP Percent: 95 %
Brady Statistic AS VS Percent: 0 %
Date Time Interrogation Session: 20180725114439
Implantable Lead Implant Date: 19980518
Implantable Lead Implant Date: 20040301
Implantable Lead Location: 753859
Implantable Lead Location: 753860
Implantable Lead Model: 4269
Implantable Lead Model: 4285
Implantable Lead Serial Number: 249440
Implantable Lead Serial Number: 295540
Implantable Pulse Generator Implant Date: 20111206
Lead Channel Impedance Value: 593 Ohm
Lead Channel Impedance Value: 882 Ohm
Lead Channel Pacing Threshold Amplitude: 1.125 V
Lead Channel Pacing Threshold Amplitude: 1.375 V
Lead Channel Pacing Threshold Pulse Width: 0.4 ms
Lead Channel Pacing Threshold Pulse Width: 0.4 ms
Lead Channel Setting Pacing Amplitude: 2 V
Lead Channel Setting Pacing Amplitude: 2.5 V
Lead Channel Setting Pacing Pulse Width: 0.4 ms
Lead Channel Setting Sensing Sensitivity: 5.6 mV

## 2017-01-04 ENCOUNTER — Encounter: Payer: Self-pay | Admitting: Pharmacist

## 2017-01-09 DIAGNOSIS — M25561 Pain in right knee: Secondary | ICD-10-CM | POA: Diagnosis not present

## 2017-01-09 DIAGNOSIS — M179 Osteoarthritis of knee, unspecified: Secondary | ICD-10-CM | POA: Diagnosis not present

## 2017-01-10 DIAGNOSIS — Z23 Encounter for immunization: Secondary | ICD-10-CM | POA: Diagnosis not present

## 2017-01-17 ENCOUNTER — Ambulatory Visit (INDEPENDENT_AMBULATORY_CARE_PROVIDER_SITE_OTHER): Payer: Medicare Other | Admitting: Cardiology

## 2017-01-17 DIAGNOSIS — Z5181 Encounter for therapeutic drug level monitoring: Secondary | ICD-10-CM | POA: Diagnosis not present

## 2017-01-17 DIAGNOSIS — I4891 Unspecified atrial fibrillation: Secondary | ICD-10-CM | POA: Diagnosis not present

## 2017-01-17 DIAGNOSIS — Z7901 Long term (current) use of anticoagulants: Secondary | ICD-10-CM | POA: Diagnosis not present

## 2017-01-17 LAB — PROTIME-INR: INR: 2.1 — AB (ref 0.9–1.1)

## 2017-01-21 ENCOUNTER — Ambulatory Visit (INDEPENDENT_AMBULATORY_CARE_PROVIDER_SITE_OTHER): Payer: Medicare Other | Admitting: Internal Medicine

## 2017-01-21 ENCOUNTER — Encounter: Payer: Self-pay | Admitting: Internal Medicine

## 2017-01-21 VITALS — BP 128/68 | HR 87 | Ht 60.0 in | Wt 145.2 lb

## 2017-01-21 DIAGNOSIS — I480 Paroxysmal atrial fibrillation: Secondary | ICD-10-CM

## 2017-01-21 NOTE — Patient Instructions (Addendum)
Medication Instructions:  Your physician recommends that you continue on your current medications as directed. Please refer to the Current Medication list given to you today.   Labwork: None ordered   Testing/Procedures: None ordered   Follow-Up: Your physician wants you to follow-up in: 12 months with Chanetta Marshall, NP. You will receive a reminder letter in the mail two months in advance. If you don't receive a letter, please call our office to schedule the follow-up appointment.  Remote monitoring is used to monitor your Pacemaker from home. This monitoring reduces the number of office visits required to check your device to one time per year. It allows [place] to keep an eye on the functioning of your device to ensure it is working properly. You are scheduled for a device check from home on 03/06/17. You may send your transmission at any time that day. If you have a wireless device, the transmission will be sent automatically. After your physician reviews your transmission, you will receive a postcard with your next transmission date.     Any Other Special Instructions Will Be Listed Below (If Applicable).     If you need a refill on your cardiac medications before your next appointment, please call your pharmacy.

## 2017-01-21 NOTE — Progress Notes (Signed)
PCP: [name], MD   Primary EP:  Dr Rayann Heman  [name] is a 83 y.o. female who presents today for routine electrophysiology followup.  Since last being seen in our clinic, the patient reports doing very well.  Her primary concern is with chronic back pain.  Her hearing is also poor. + stable chronic edema.   Today, she denies symptoms of palpitations, chest pain, shortness of breath,  dizziness, presyncope, or syncope.  The patient is otherwise without complaint today.   Past Medical History:  Diagnosis Date  . Arthritis   . Atrial fibrillation ([place])   . Dementia   . Depression   . DVT (deep venous thrombosis) ([place]) 2011  . HB (heart block)    s/p PPM, most recent generator change 12/11 by JA  . Hiatal hernia   . Hyperlipidemia   . Hypothyroidism   . PTE (post-transplant erythrocytosis)   . Pulmonary embolism ([place]) 2011   on coumadin  . UTI (lower urinary tract infection)    Past Surgical History:  Procedure Laterality Date  . CHOLECYSTECTOMY  09/2010  . HEMORRHOID SURGERY    . hysterectomy (other)    . JOINT REPLACEMENT    . pacemaker     most recent generator 12/11 by JA  . TOTAL KNEE ARTHROPLASTY Left     ROS- all systems are reviewed and negative except as per HPI above  Current Outpatient Prescriptions  Medication Sig Dispense Refill  . Calcium Carbonate (CALCIUM-CARB 600 PO) Take 1 capsule by mouth daily.     . cholecalciferol (VITAMIN D) 1000 UNITS tablet Take 1,000 Units by mouth daily.     [name] levETIRAcetam (KEPPRA) 250 MG tablet Take 250 mg by mouth daily.     [name] levothyroxine (SYNTHROID, LEVOTHROID) 50 MCG tablet Take 50 mcg by mouth daily before breakfast.    . loratadine (CLARITIN) 10 MG tablet Take 10 mg by mouth daily as needed for allergies or rhinitis.     . memantine (NAMENDA) 10 MG tablet Take 5 mg by mouth 2 (two) times daily.     [name] NITROSTAT 0.4 MG SL tablet Place 0.4 mg under the tongue every 5 (five) minutes as needed for chest pain  (MAX 3 TABLETS).     [name] omeprazole (PRILOSEC) 20 MG capsule Take 20 mg by mouth 2 (two) times daily.     . pravastatin (PRAVACHOL) 20 MG tablet Take 20 mg by mouth daily.      . Probiotic Product (PROBIOTIC DAILY PO) Take 1 capsule by mouth daily.    Orlie Dakin Sodium (SENNA-S PO) Take 1 tablet by mouth 2 (two) times daily.     . sertraline (ZOLOFT) 50 MG tablet Take 50 mg by mouth daily.    [name] warfarin (COUMADIN) 5 MG tablet Take as directed by coumadin clinic 120 tablet 1   No current facility-administered medications for this visit.     Physical Exam: Vitals:   01/21/17 1634  BP: 128/68  Pulse: 87  SpO2: 93%  Weight: 145 lb 3.2 oz (65.9 kg)  Height: 5' (1.524 m)    GEN- The patient is well appearing, alert and oriented x 3 today.   Head- normocephalic, atraumatic Eyes-  Sclera clear, conjunctiva pink Ears- hearing is very poor Oropharynx- clear Lungs- Clear to ausculation bilaterally, normal work of breathing Chest- pacemaker pocket is well healed Heart- Regular rate and rhythm, 2/6 SEM LUSB which is mid peaking GI- soft, NT, ND, + BS Extremities- no clubbing,  cyanosis, +1 edema  Pacemaker interrogation- reviewed in detail today,  See PACEART report  ekg tracing ordered today is personally reviewed and shows AV paced  Assessment and Plan:  1. Symptomatic complete heart block Normal pacemaker function See Pace Art report No changes today  2. Afib/ history of PTE Continue long term anticoagualtion  3. Aortic stenosis Moderate on exam Mild by echo 2016 Will follow clinically Given advanced age, a conservative approach is best  carelink Return to see EP NP in a year  Thompson Grayer MD, [facility] 01/21/2017 4:43 PM

## 2017-01-22 LAB — CUP PACEART INCLINIC DEVICE CHECK
Battery Impedance: 561 Ohm
Battery Remaining Longevity: 84 mo
Battery Voltage: 2.78 V
Brady Statistic AP VP Percent: 5 %
Brady Statistic AP VS Percent: 0 %
Brady Statistic AS VP Percent: 95 %
Brady Statistic AS VS Percent: 0 %
Date Time Interrogation Session: 20180910171942
Implantable Lead Implant Date: 19980518
Implantable Lead Implant Date: 20040301
Implantable Lead Location: 753859
Implantable Lead Location: 753860
Implantable Lead Model: 4269
Implantable Lead Model: 4285
Implantable Lead Serial Number: 249440
Implantable Lead Serial Number: 295540
Implantable Pulse Generator Implant Date: 20111206
Lead Channel Impedance Value: 612 Ohm
Lead Channel Impedance Value: 877 Ohm
Lead Channel Pacing Threshold Amplitude: 1 V
Lead Channel Pacing Threshold Amplitude: 1.125 V
Lead Channel Pacing Threshold Amplitude: 1.25 V
Lead Channel Pacing Threshold Amplitude: 1.25 V
Lead Channel Pacing Threshold Pulse Width: 0.4 ms
Lead Channel Pacing Threshold Pulse Width: 0.4 ms
Lead Channel Pacing Threshold Pulse Width: 0.4 ms
Lead Channel Pacing Threshold Pulse Width: 0.4 ms
Lead Channel Sensing Intrinsic Amplitude: 1 mV
Lead Channel Setting Pacing Amplitude: 2 V
Lead Channel Setting Pacing Amplitude: 2.5 V
Lead Channel Setting Pacing Pulse Width: 0.4 ms
Lead Channel Setting Sensing Sensitivity: 5.6 mV

## 2017-01-31 DIAGNOSIS — H353221 Exudative age-related macular degeneration, left eye, with active choroidal neovascularization: Secondary | ICD-10-CM | POA: Diagnosis not present

## 2017-02-26 DIAGNOSIS — H6122 Impacted cerumen, left ear: Secondary | ICD-10-CM | POA: Diagnosis not present

## 2017-02-28 ENCOUNTER — Ambulatory Visit (INDEPENDENT_AMBULATORY_CARE_PROVIDER_SITE_OTHER): Payer: Medicare Other | Admitting: Interventional Cardiology

## 2017-02-28 DIAGNOSIS — I4891 Unspecified atrial fibrillation: Secondary | ICD-10-CM

## 2017-02-28 DIAGNOSIS — Z5181 Encounter for therapeutic drug level monitoring: Secondary | ICD-10-CM | POA: Diagnosis not present

## 2017-02-28 DIAGNOSIS — Z7901 Long term (current) use of anticoagulants: Secondary | ICD-10-CM | POA: Diagnosis not present

## 2017-02-28 LAB — PROTIME-INR: INR: 2.9 — AB (ref 0.9–1.1)

## 2017-03-06 ENCOUNTER — Ambulatory Visit (INDEPENDENT_AMBULATORY_CARE_PROVIDER_SITE_OTHER): Payer: Medicare Other | Admitting: *Deleted

## 2017-03-06 ENCOUNTER — Telehealth: Payer: Self-pay | Admitting: Cardiology

## 2017-03-06 DIAGNOSIS — I442 Atrioventricular block, complete: Secondary | ICD-10-CM | POA: Diagnosis not present

## 2017-03-06 NOTE — Telephone Encounter (Signed)
LMOVM reminding pt to send remote transmission.   

## 2017-03-07 LAB — CUP PACEART REMOTE DEVICE CHECK
Battery Impedance: 586 Ohm
Battery Remaining Longevity: 81 mo
Battery Voltage: 2.78 V
Brady Statistic AP VP Percent: 2 %
Brady Statistic AP VS Percent: 0 %
Brady Statistic AS VP Percent: 98 %
Brady Statistic AS VS Percent: 0 %
Date Time Interrogation Session: 20181024210452
Implantable Lead Implant Date: 19980518
Implantable Lead Implant Date: 20040301
Implantable Lead Location: 753859
Implantable Lead Location: 753860
Implantable Lead Model: 4269
Implantable Lead Model: 4285
Implantable Lead Serial Number: 249440
Implantable Lead Serial Number: 295540
Implantable Pulse Generator Implant Date: 20111206
Lead Channel Impedance Value: 612 Ohm
Lead Channel Impedance Value: 874 Ohm
Lead Channel Pacing Threshold Amplitude: 1.375 V
Lead Channel Pacing Threshold Amplitude: 1.375 V
Lead Channel Pacing Threshold Pulse Width: 0.4 ms
Lead Channel Pacing Threshold Pulse Width: 0.4 ms
Lead Channel Setting Pacing Amplitude: 2 V
Lead Channel Setting Pacing Amplitude: 2.5 V
Lead Channel Setting Pacing Pulse Width: 0.4 ms
Lead Channel Setting Sensing Sensitivity: 5.6 mV

## 2017-03-07 NOTE — Progress Notes (Signed)
Remote pacemaker transmission.   

## 2017-03-08 ENCOUNTER — Encounter: Payer: Self-pay | Admitting: Cardiology

## 2017-03-19 ENCOUNTER — Encounter: Payer: Self-pay | Admitting: Internal Medicine

## 2017-03-19 DIAGNOSIS — E785 Hyperlipidemia, unspecified: Secondary | ICD-10-CM | POA: Diagnosis not present

## 2017-03-19 DIAGNOSIS — L821 Other seborrheic keratosis: Secondary | ICD-10-CM | POA: Diagnosis not present

## 2017-03-19 DIAGNOSIS — L570 Actinic keratosis: Secondary | ICD-10-CM | POA: Diagnosis not present

## 2017-03-19 DIAGNOSIS — K219 Gastro-esophageal reflux disease without esophagitis: Secondary | ICD-10-CM | POA: Diagnosis not present

## 2017-03-19 DIAGNOSIS — N390 Urinary tract infection, site not specified: Secondary | ICD-10-CM | POA: Diagnosis not present

## 2017-03-19 LAB — COMPLETE METABOLIC PANEL WITH GFR
ALT: 38
AST: 22
Albumin: 3.7
Alkaline Phosphatase: 120
BUN: 23 — AB (ref 4–21)
Calcium: 9.1
Creat: 0.9
Glucose: 97
Potassium: 5.9
Sodium: 145
Total Bilirubin: 0.3
Total Protein: 6.7 g/dL

## 2017-03-19 LAB — LIPID PANEL
Cholesterol, Total: 198
HDL: 61
LDL (calc): 111
Triglycerides: 130

## 2017-03-19 LAB — CBC
HCT: 41.2
HGB: 13.1
MCV: 95.6
WBC: 5.9
platelet count: 226

## 2017-03-20 DIAGNOSIS — H353221 Exudative age-related macular degeneration, left eye, with active choroidal neovascularization: Secondary | ICD-10-CM | POA: Diagnosis not present

## 2017-03-26 DIAGNOSIS — M25512 Pain in left shoulder: Secondary | ICD-10-CM | POA: Diagnosis not present

## 2017-03-26 DIAGNOSIS — M7060 Trochanteric bursitis, unspecified hip: Secondary | ICD-10-CM | POA: Diagnosis not present

## 2017-03-26 DIAGNOSIS — M79652 Pain in left thigh: Secondary | ICD-10-CM | POA: Diagnosis not present

## 2017-03-26 DIAGNOSIS — M1990 Unspecified osteoarthritis, unspecified site: Secondary | ICD-10-CM | POA: Diagnosis not present

## 2017-03-26 DIAGNOSIS — M545 Low back pain: Secondary | ICD-10-CM | POA: Diagnosis not present

## 2017-04-09 DIAGNOSIS — M810 Age-related osteoporosis without current pathological fracture: Secondary | ICD-10-CM | POA: Diagnosis not present

## 2017-04-11 ENCOUNTER — Ambulatory Visit (INDEPENDENT_AMBULATORY_CARE_PROVIDER_SITE_OTHER): Payer: Medicare Other | Admitting: Cardiovascular Disease

## 2017-04-11 DIAGNOSIS — I4891 Unspecified atrial fibrillation: Secondary | ICD-10-CM

## 2017-04-11 DIAGNOSIS — Z5181 Encounter for therapeutic drug level monitoring: Secondary | ICD-10-CM | POA: Diagnosis not present

## 2017-04-11 DIAGNOSIS — Z7901 Long term (current) use of anticoagulants: Secondary | ICD-10-CM | POA: Diagnosis not present

## 2017-04-11 LAB — PROTIME-INR: INR: 2.6 — AB (ref 0.9–1.1)

## 2017-04-18 ENCOUNTER — Ambulatory Visit (INDEPENDENT_AMBULATORY_CARE_PROVIDER_SITE_OTHER): Payer: Medicare Other | Admitting: Cardiovascular Disease

## 2017-04-18 DIAGNOSIS — Z7901 Long term (current) use of anticoagulants: Secondary | ICD-10-CM | POA: Diagnosis not present

## 2017-04-18 DIAGNOSIS — Z5181 Encounter for therapeutic drug level monitoring: Secondary | ICD-10-CM

## 2017-04-18 DIAGNOSIS — Z0000 Encounter for general adult medical examination without abnormal findings: Secondary | ICD-10-CM | POA: Diagnosis not present

## 2017-04-18 DIAGNOSIS — I4891 Unspecified atrial fibrillation: Secondary | ICD-10-CM | POA: Diagnosis not present

## 2017-04-18 LAB — PROTIME-INR: INR: 4.8 — AB (ref 0.9–1.1)

## 2017-04-25 ENCOUNTER — Ambulatory Visit (INDEPENDENT_AMBULATORY_CARE_PROVIDER_SITE_OTHER): Payer: Medicare Other | Admitting: Internal Medicine

## 2017-04-25 DIAGNOSIS — I4891 Unspecified atrial fibrillation: Secondary | ICD-10-CM | POA: Diagnosis not present

## 2017-04-25 DIAGNOSIS — Z5181 Encounter for therapeutic drug level monitoring: Secondary | ICD-10-CM | POA: Diagnosis not present

## 2017-04-25 DIAGNOSIS — I5023 Acute on chronic systolic (congestive) heart failure: Secondary | ICD-10-CM | POA: Diagnosis not present

## 2017-04-25 LAB — PROTIME-INR: INR: 1.8 — AB (ref 0.9–1.1)

## 2017-05-08 DIAGNOSIS — H353221 Exudative age-related macular degeneration, left eye, with active choroidal neovascularization: Secondary | ICD-10-CM | POA: Diagnosis not present

## 2017-05-13 ENCOUNTER — Ambulatory Visit (INDEPENDENT_AMBULATORY_CARE_PROVIDER_SITE_OTHER): Payer: Medicare Other | Admitting: Internal Medicine

## 2017-05-13 DIAGNOSIS — I4891 Unspecified atrial fibrillation: Secondary | ICD-10-CM | POA: Diagnosis not present

## 2017-05-13 DIAGNOSIS — Z5181 Encounter for therapeutic drug level monitoring: Secondary | ICD-10-CM | POA: Diagnosis not present

## 2017-05-13 DIAGNOSIS — Z7901 Long term (current) use of anticoagulants: Secondary | ICD-10-CM | POA: Diagnosis not present

## 2017-05-13 LAB — PROTIME-INR: INR: 2.6 — AB (ref 0.9–1.1)

## 2017-05-21 DIAGNOSIS — M25512 Pain in left shoulder: Secondary | ICD-10-CM | POA: Diagnosis not present

## 2017-05-21 DIAGNOSIS — M7062 Trochanteric bursitis, left hip: Secondary | ICD-10-CM | POA: Diagnosis not present

## 2017-05-21 DIAGNOSIS — M545 Low back pain: Secondary | ICD-10-CM | POA: Diagnosis not present

## 2017-05-21 DIAGNOSIS — M7060 Trochanteric bursitis, unspecified hip: Secondary | ICD-10-CM | POA: Diagnosis not present

## 2017-05-21 DIAGNOSIS — M7631 Iliotibial band syndrome, right leg: Secondary | ICD-10-CM | POA: Diagnosis not present

## 2017-05-21 DIAGNOSIS — M7061 Trochanteric bursitis, right hip: Secondary | ICD-10-CM | POA: Diagnosis not present

## 2017-05-21 DIAGNOSIS — M1990 Unspecified osteoarthritis, unspecified site: Secondary | ICD-10-CM | POA: Diagnosis not present

## 2017-06-03 ENCOUNTER — Ambulatory Visit (INDEPENDENT_AMBULATORY_CARE_PROVIDER_SITE_OTHER): Payer: Medicare Other | Admitting: Pharmacist

## 2017-06-03 DIAGNOSIS — Z5181 Encounter for therapeutic drug level monitoring: Secondary | ICD-10-CM

## 2017-06-03 DIAGNOSIS — I4891 Unspecified atrial fibrillation: Secondary | ICD-10-CM | POA: Diagnosis not present

## 2017-06-03 DIAGNOSIS — Z7901 Long term (current) use of anticoagulants: Secondary | ICD-10-CM | POA: Diagnosis not present

## 2017-06-03 LAB — POCT INR: INR: 3.1

## 2017-06-05 ENCOUNTER — Ambulatory Visit (INDEPENDENT_AMBULATORY_CARE_PROVIDER_SITE_OTHER): Payer: Medicare Other | Admitting: *Deleted

## 2017-06-05 DIAGNOSIS — I442 Atrioventricular block, complete: Secondary | ICD-10-CM

## 2017-06-05 NOTE — Progress Notes (Signed)
Remote pacemaker transmission.   

## 2017-06-06 ENCOUNTER — Encounter: Payer: Self-pay | Admitting: Cardiology

## 2017-06-11 DIAGNOSIS — Z7901 Long term (current) use of anticoagulants: Secondary | ICD-10-CM | POA: Diagnosis not present

## 2017-06-11 DIAGNOSIS — I4891 Unspecified atrial fibrillation: Secondary | ICD-10-CM | POA: Diagnosis not present

## 2017-06-11 DIAGNOSIS — M5136 Other intervertebral disc degeneration, lumbar region: Secondary | ICD-10-CM | POA: Diagnosis not present

## 2017-06-11 DIAGNOSIS — K219 Gastro-esophageal reflux disease without esophagitis: Secondary | ICD-10-CM | POA: Diagnosis not present

## 2017-06-11 DIAGNOSIS — F329 Major depressive disorder, single episode, unspecified: Secondary | ICD-10-CM | POA: Diagnosis not present

## 2017-06-11 DIAGNOSIS — E875 Hyperkalemia: Secondary | ICD-10-CM | POA: Diagnosis not present

## 2017-06-11 DIAGNOSIS — E039 Hypothyroidism, unspecified: Secondary | ICD-10-CM | POA: Diagnosis not present

## 2017-06-11 DIAGNOSIS — E7800 Pure hypercholesterolemia, unspecified: Secondary | ICD-10-CM | POA: Diagnosis not present

## 2017-06-11 DIAGNOSIS — E559 Vitamin D deficiency, unspecified: Secondary | ICD-10-CM | POA: Diagnosis not present

## 2017-06-24 ENCOUNTER — Ambulatory Visit (INDEPENDENT_AMBULATORY_CARE_PROVIDER_SITE_OTHER): Payer: Medicare Other

## 2017-06-24 DIAGNOSIS — I4891 Unspecified atrial fibrillation: Secondary | ICD-10-CM

## 2017-06-24 DIAGNOSIS — Z5181 Encounter for therapeutic drug level monitoring: Secondary | ICD-10-CM | POA: Diagnosis not present

## 2017-06-24 DIAGNOSIS — Z7901 Long term (current) use of anticoagulants: Secondary | ICD-10-CM | POA: Diagnosis not present

## 2017-06-24 LAB — PROTIME-INR: INR: 3 — AB (ref 0.9–1.1)

## 2017-06-24 NOTE — Patient Instructions (Signed)
Description   Spoke with pt's husband and instructed him to have pt start taking 5mg s daily.  Recheck in 3 weeks.   Orders faxed to [facility].

## 2017-06-25 LAB — CUP PACEART REMOTE DEVICE CHECK
Battery Impedance: 586 Ohm
Battery Remaining Longevity: 82 mo
Battery Voltage: 2.78 V
Brady Statistic AP VP Percent: 2 %
Brady Statistic AP VS Percent: 0 %
Brady Statistic AS VP Percent: 98 %
Brady Statistic AS VS Percent: 0 %
Date Time Interrogation Session: 20190123150714
Implantable Lead Implant Date: 19980518
Implantable Lead Implant Date: 20040301
Implantable Lead Location: 753859
Implantable Lead Location: 753860
Implantable Lead Model: 4269
Implantable Lead Model: 4285
Implantable Lead Serial Number: 249440
Implantable Lead Serial Number: 295540
Implantable Pulse Generator Implant Date: 20111206
Lead Channel Impedance Value: 602 Ohm
Lead Channel Impedance Value: 926 Ohm
Lead Channel Pacing Threshold Amplitude: 1.25 V
Lead Channel Pacing Threshold Amplitude: 1.25 V
Lead Channel Pacing Threshold Pulse Width: 0.4 ms
Lead Channel Pacing Threshold Pulse Width: 0.4 ms
Lead Channel Setting Pacing Amplitude: 2 V
Lead Channel Setting Pacing Amplitude: 2.5 V
Lead Channel Setting Pacing Pulse Width: 0.4 ms
Lead Channel Setting Sensing Sensitivity: 5.6 mV

## 2017-06-26 DIAGNOSIS — H353221 Exudative age-related macular degeneration, left eye, with active choroidal neovascularization: Secondary | ICD-10-CM | POA: Diagnosis not present

## 2017-06-26 DIAGNOSIS — H353112 Nonexudative age-related macular degeneration, right eye, intermediate dry stage: Secondary | ICD-10-CM | POA: Diagnosis not present

## 2017-07-02 DIAGNOSIS — M545 Low back pain: Secondary | ICD-10-CM | POA: Diagnosis not present

## 2017-07-02 DIAGNOSIS — M25512 Pain in left shoulder: Secondary | ICD-10-CM | POA: Diagnosis not present

## 2017-07-02 DIAGNOSIS — M1990 Unspecified osteoarthritis, unspecified site: Secondary | ICD-10-CM | POA: Diagnosis not present

## 2017-07-02 DIAGNOSIS — M7582 Other shoulder lesions, left shoulder: Secondary | ICD-10-CM | POA: Diagnosis not present

## 2017-07-02 DIAGNOSIS — M7060 Trochanteric bursitis, unspecified hip: Secondary | ICD-10-CM | POA: Diagnosis not present

## 2017-07-04 DIAGNOSIS — R259 Unspecified abnormal involuntary movements: Secondary | ICD-10-CM | POA: Diagnosis not present

## 2017-07-08 ENCOUNTER — Other Ambulatory Visit: Payer: Self-pay | Admitting: Pharmacist

## 2017-07-12 ENCOUNTER — Ambulatory Visit (INDEPENDENT_AMBULATORY_CARE_PROVIDER_SITE_OTHER): Payer: Medicare Other | Admitting: Specialist

## 2017-07-15 ENCOUNTER — Ambulatory Visit (INDEPENDENT_AMBULATORY_CARE_PROVIDER_SITE_OTHER): Payer: Medicare Other | Admitting: Specialist

## 2017-07-15 ENCOUNTER — Ambulatory Visit (INDEPENDENT_AMBULATORY_CARE_PROVIDER_SITE_OTHER): Payer: Medicare Other

## 2017-07-15 ENCOUNTER — Telehealth: Payer: Self-pay | Admitting: Internal Medicine

## 2017-07-15 ENCOUNTER — Ambulatory Visit (INDEPENDENT_AMBULATORY_CARE_PROVIDER_SITE_OTHER): Payer: Medicare Other | Admitting: Internal Medicine

## 2017-07-15 ENCOUNTER — Encounter (INDEPENDENT_AMBULATORY_CARE_PROVIDER_SITE_OTHER): Payer: Self-pay | Admitting: Specialist

## 2017-07-15 VITALS — BP 103/68 | HR 88 | Ht 60.0 in | Wt 136.0 lb

## 2017-07-15 DIAGNOSIS — M545 Low back pain: Secondary | ICD-10-CM

## 2017-07-15 DIAGNOSIS — I4891 Unspecified atrial fibrillation: Secondary | ICD-10-CM | POA: Diagnosis not present

## 2017-07-15 DIAGNOSIS — Z5181 Encounter for therapeutic drug level monitoring: Secondary | ICD-10-CM | POA: Diagnosis not present

## 2017-07-15 LAB — PROTIME-INR: INR: 2.3 — AB (ref 0.9–1.1)

## 2017-07-15 NOTE — Patient Instructions (Addendum)
Avoid frequent bending and stooping  No lifting greater than 5 -10 lbs. May use ice or moist heat for pain. Weight loss is of benefit. Handicap license is approved.  Recommend for PT at [facility] and consider enlisting in a Yoga program.

## 2017-07-15 NOTE — Telephone Encounter (Signed)
Spoke with pt's husband asked of the pacemaker site was swollen, red or bruised he stated that the site was none of these. Asked pts husband if pt had fallen recently and hit the pacemaker site he stated no. He also denied that the pt was running any fevers. Informed pts husband that it was unlikely the pacemaker especially since the device has been in 8 years. Informed pts husband to f/u with primary care provider because the pain could also be muscle related and if the PCP felt that pt needed to be seen by Dr. Rayann Heman we would be happy to make pt an appointment. Pts husband voiced understanding

## 2017-07-15 NOTE — Telephone Encounter (Signed)
°  1. Has your device fired? no  2. Is you device beeping? no  3. Are you experiencing draining or swelling at device site? no  4. Are you calling to see if we received your device transmission? no  5. Have you passed out? No  Patient husband calling states patient has some soreness around the pacemaker site    Please route to [place]

## 2017-07-15 NOTE — Progress Notes (Signed)
Office Visit Note   Patient: [name]           Date of Birth: 1922-11-23           MRN: [id number] Visit Date: 07/15/2017              Requested by: [name], [address], [place], [place] [address] PCP: [name], MD   Assessment & Plan: Visit Diagnoses:  1. Low back pain, unspecified back pain laterality, unspecified chronicity, with sciatica presence unspecified    Plan: Avoid frequent bending and stooping  No lifting greater than 5 -10 lbs. May use ice or moist heat for pain. Weight loss is of benefit. Handicap license is approved. Recommend for PT at [facility] and consider enlisting in a Yoga program.  Follow-Up Instructions: No Follow-up on file.   Orders:  Orders Placed This Encounter  Procedures  . XR Lumbar Spine 2-3 Views   No orders of the defined types were placed in this encounter.     Procedures: No procedures performed   Clinical Data: No additional findings.   Subjective: Chief Complaint  Patient presents with  . Lower Back - Pain    84 year old female seen with persisting back pain. Has been followed by Dr. Doy Mince at [facility] for myoclonus. She has had low back pain and pain in the left shoulder and bilateral hips and is receiving injections through Dr.Mckenzie's office. She is undergoing transitioning from [place] to gabapentin for myoclonus.     Review of Systems  Constitutional: Negative.   HENT: Negative.   Eyes: Negative.   Respiratory: Negative.   Cardiovascular: Negative.   Gastrointestinal: Negative.   Endocrine: Negative.   Genitourinary: Negative.   Musculoskeletal: Negative.   Skin: Negative.   Allergic/Immunologic: Negative.   Neurological: Negative.   Hematological: Negative.   Psychiatric/Behavioral: Negative.      Objective: Vital Signs: BP 103/68 (BP Location: Left Arm, Patient Position: Sitting)   Pulse 88   Ht 5' (1.524 m)   Wt 136 lb (61.7 kg)   BMI 26.56 kg/m    Physical Exam  Constitutional: She is oriented to person, place, and time. She appears well-developed and well-nourished.  HENT:  Head: Normocephalic and atraumatic.  Eyes: EOM are normal. Pupils are equal, round, and reactive to light.  Neck: Normal range of motion. Neck supple.  Pulmonary/Chest: Effort normal and breath sounds normal.  Abdominal: Soft. Bowel sounds are normal.  Musculoskeletal: Normal range of motion.  Neurological: She is alert and oriented to person, place, and time.  Skin: Skin is warm and dry.  Psychiatric: She has a normal mood and affect. Her behavior is normal. Judgment and thought content normal.    Back Exam   Tenderness  The patient is experiencing tenderness in the lumbar.  Muscle Strength  Right Quadriceps:  5/5  Left Quadriceps:  5/5  Right Hamstrings:  5/5  Left Hamstrings:  5/5   Tests  Straight leg raise right: negative Straight leg raise left: negative  Reflexes  Patellar: normal Achilles: normal Biceps: normal Babinski's sign: normal   Other  Toe walk: abnormal Heel walk: abnormal      Specialty Comments:  No specialty comments available.  Imaging: No results found.   PMFS History: Patient Active Problem List   Diagnosis Date Noted  . Encounter for therapeutic drug monitoring 11/23/2013  . Pacemaker-Medtronic 03/11/2012  . Edema 09/28/2011  . Long term (current) use of anticoagulants 05/23/2011  . Atypical  chest pain 02/12/2011  . Fatigue 02/12/2011  . Rhinitis, chronic 01/17/2011  . Bronchitis, chronic ([place]) 12/15/2010  . Back pain 11/06/2010  . Hypotension 11/06/2010  . PAROXYSMAL ATRIAL FIBRILLATION 07/26/2010  . ATRIOVENTRICULAR BLOCK, 3RD DEGREE 04/12/2010  . DYSPHAGIA 12/14/2008   Past Medical History:  Diagnosis Date  . Arthritis   . Atrial fibrillation ([place])   . Dementia   . Depression   . DVT (deep venous thrombosis) ([place]) 2011  . HB (heart block)    s/p PPM, most recent generator change 12/11  by JA  . Hiatal hernia   . Hyperlipidemia   . Hypothyroidism   . PTE (post-transplant erythrocytosis)   . Pulmonary embolism ([place]) 2011   on coumadin  . UTI (lower urinary tract infection)     Family History  Problem Relation Age of Onset  . Stomach cancer Brother   . Stomach cancer Brother   . Liver cancer Brother   . Stroke Father   . Heart disease Father   . Arthritis Father   . Breast cancer Sister   . Heart disease Mother   . Arthritis Mother     Past Surgical History:  Procedure Laterality Date  . CHOLECYSTECTOMY  09/2010  . HEMORRHOID SURGERY    . hysterectomy (other)    . JOINT REPLACEMENT    . pacemaker     most recent generator 12/11 by JA  . TOTAL KNEE ARTHROPLASTY Left    Social History   Occupational History  . Occupation: retired bookkeeper  Tobacco Use  . Smoking status: Never Smoker  . Smokeless tobacco: Never Used  Substance and Sexual Activity  . Alcohol use: No  . Drug use: No  . Sexual activity: No

## 2017-07-31 DIAGNOSIS — M545 Low back pain: Secondary | ICD-10-CM | POA: Diagnosis not present

## 2017-07-31 DIAGNOSIS — M25512 Pain in left shoulder: Secondary | ICD-10-CM | POA: Diagnosis not present

## 2017-07-31 DIAGNOSIS — M7060 Trochanteric bursitis, unspecified hip: Secondary | ICD-10-CM | POA: Diagnosis not present

## 2017-07-31 DIAGNOSIS — M1990 Unspecified osteoarthritis, unspecified site: Secondary | ICD-10-CM | POA: Diagnosis not present

## 2017-08-05 ENCOUNTER — Ambulatory Visit (INDEPENDENT_AMBULATORY_CARE_PROVIDER_SITE_OTHER): Payer: Medicare Other | Admitting: Cardiology

## 2017-08-05 DIAGNOSIS — Z5181 Encounter for therapeutic drug level monitoring: Secondary | ICD-10-CM

## 2017-08-05 DIAGNOSIS — Z7901 Long term (current) use of anticoagulants: Secondary | ICD-10-CM | POA: Diagnosis not present

## 2017-08-05 DIAGNOSIS — I4891 Unspecified atrial fibrillation: Secondary | ICD-10-CM

## 2017-08-05 LAB — PROTIME-INR: INR: 3.1 — AB (ref 0.9–1.1)

## 2017-08-05 MED ORDER — WARFARIN SODIUM 5 MG PO TABS: ORAL_TABLET | ORAL | 1 refills | Status: DC

## 2017-08-05 NOTE — Patient Instructions (Signed)
Description   Spoke with pt's husband and instructed him to have pt take 2.5mg  today then continue taking 5mg s daily except 7.5mg  on Fridays.  Recheck in 2 weeks.  Orders faxed to [facility].

## 2017-08-19 ENCOUNTER — Ambulatory Visit (INDEPENDENT_AMBULATORY_CARE_PROVIDER_SITE_OTHER): Payer: Medicare Other | Admitting: Internal Medicine

## 2017-08-19 DIAGNOSIS — I4891 Unspecified atrial fibrillation: Secondary | ICD-10-CM | POA: Diagnosis not present

## 2017-08-19 DIAGNOSIS — Z7901 Long term (current) use of anticoagulants: Secondary | ICD-10-CM | POA: Diagnosis not present

## 2017-08-19 DIAGNOSIS — Z5181 Encounter for therapeutic drug level monitoring: Secondary | ICD-10-CM | POA: Diagnosis not present

## 2017-08-19 LAB — PROTIME-INR: INR: 2.6 — AB (ref 0.9–1.1)

## 2017-08-19 NOTE — Patient Instructions (Signed)
Description   Spoke with pt's husband and instructed him to continue taking 5mg s daily except 7.5mg  on Fridays.  Recheck in 3 weeks.  Orders faxed to [facility].

## 2017-08-20 DIAGNOSIS — H353221 Exudative age-related macular degeneration, left eye, with active choroidal neovascularization: Secondary | ICD-10-CM | POA: Diagnosis not present

## 2017-09-02 ENCOUNTER — Non-Acute Institutional Stay: Payer: Medicare Other | Admitting: Internal Medicine

## 2017-09-02 ENCOUNTER — Encounter: Payer: Self-pay | Admitting: Internal Medicine

## 2017-09-02 VITALS — BP 118/62 | HR 80 | Temp 97.7°F | Resp 16 | Ht 61.0 in | Wt 148.2 lb

## 2017-09-02 DIAGNOSIS — I480 Paroxysmal atrial fibrillation: Secondary | ICD-10-CM | POA: Diagnosis not present

## 2017-09-02 DIAGNOSIS — Z7901 Long term (current) use of anticoagulants: Secondary | ICD-10-CM | POA: Diagnosis not present

## 2017-09-02 DIAGNOSIS — R4189 Other symptoms and signs involving cognitive functions and awareness: Secondary | ICD-10-CM | POA: Diagnosis not present

## 2017-09-02 DIAGNOSIS — E785 Hyperlipidemia, unspecified: Secondary | ICD-10-CM

## 2017-09-02 DIAGNOSIS — R259 Unspecified abnormal involuntary movements: Secondary | ICD-10-CM

## 2017-09-02 DIAGNOSIS — Z86718 Personal history of other venous thrombosis and embolism: Secondary | ICD-10-CM

## 2017-09-02 DIAGNOSIS — J310 Chronic rhinitis: Secondary | ICD-10-CM

## 2017-09-02 DIAGNOSIS — E039 Hypothyroidism, unspecified: Secondary | ICD-10-CM | POA: Diagnosis not present

## 2017-09-02 DIAGNOSIS — K219 Gastro-esophageal reflux disease without esophagitis: Secondary | ICD-10-CM | POA: Diagnosis not present

## 2017-09-02 DIAGNOSIS — I442 Atrioventricular block, complete: Secondary | ICD-10-CM

## 2017-09-02 MED ORDER — SERTRALINE HCL 25 MG PO TABS
25.0000 mg | ORAL_TABLET | Freq: Every day | ORAL | 3 refills | Status: DC
Start: 2017-09-02 — End: 2018-04-02

## 2017-09-02 MED ORDER — GABAPENTIN 300 MG PO CAPS
300.0000 mg | ORAL_CAPSULE | Freq: Two times a day (BID) | ORAL | 3 refills | Status: DC
Start: 2017-09-02 — End: 2018-03-25

## 2017-09-02 NOTE — Progress Notes (Signed)
[facility]  Provider: [name] MD   Location:  [place] of Service:  Clinic (12)  PCP: [name], MD Patient Care Team: [name], MD as PCP - General (Internal Medicine)  Extended Emergency Contact Information Primary Emergency Contact: Walston,[name] Address: [place], [place] Phone: [phone] Relation: Spouse Secondary Emergency Contact: [name] Address: [address]          [place], [place] [address] [name] of [place] Phone: [phone] Relation: Daughter  Code Status: full code  Goals of Care: Advanced Directive information Advanced Directives 09/02/2017  Does Patient Have a Medical Advance Directive? Yes  Type of Paramedic of [place];Living will  Does patient want to make changes to medical advance directive? No - Patient declined  Copy of [place] in Chart? Yes     Chief Complaint  Patient presents with  . New Patient (Initial Visit)    establish care  . Medication Refill    No refills needed at this time    HPI: Patient is a 84 y.o. female seen today to establish care. She has noticed bruise to left lower leg. She was seeing Dr Noah Delaine at [facility]. She was last seen there tis year, does not remember the exact month. She is here with her husband.  afib- denies palpitation. Has a pacemaker. She takes warfarin 5 mg daily. She follows with cardiology Dr Rayann Heman and sees him once a year  Involuntary movements- currently on sertraline 75 mg daily and gabapentin 300 mg bid at present. Off keppra, it appears to be tapered on 2/19 visit.   Hypothyroidism- takes levothyroxine 50 mcg daily, denies any symptom  Cognitive impairment- currently on memantine 5 mg bid and donepezil 5 mg daily. Followed by neurology service Dr Doy Mince, note reviewed in care everywhere.    Long term anticoagulation- on warfarin, bruises easily  gerd- currently on omeprazole 20 mg bid, no known history of gastric ulcer or bleed reported.   chronic rhinitis- takes loratadine 10 mg daily and mucinex, has runny nose and PND at times  Hyperlipidemia- takes pravastatin 20 mg daily, denies muscle aches  AV block- history of third degree AV block, s/p pacemaker in 1998. denies chest pain this visit. Pacemaker due to be checked end of this month.    Past Medical History:  Diagnosis Date  . Arthritis   . Atrial fibrillation ([place])   . Dementia   . Depression   . DVT (deep venous thrombosis) ([place]) 2011  . HB (heart block)    s/p PPM, most recent generator change 12/11 by JA  . Hiatal hernia   . Hyperlipidemia   . Hypothyroidism   . PTE (post-transplant erythrocytosis)   . Pulmonary embolism ([place]) 2011   on coumadin  . UTI (lower urinary tract infection)    Past Surgical History:  Procedure Laterality Date  . CHOLECYSTECTOMY  09/2010  . HEMORRHOID SURGERY    . hysterectomy (other)    . JOINT REPLACEMENT    . pacemaker     most recent generator 12/11 by JA  . TOTAL KNEE ARTHROPLASTY Left     reports that she has never smoked. She has never used smokeless tobacco. She reports that she does not drink alcohol or use drugs. Social History   Socioeconomic History  . Marital status: Married  Spouse name: Not on file  . Number of children: 2  . Years of education: Not on file  . Highest education level: Not on file  Occupational History  . Occupation: retired Medical laboratory scientific officer  . Financial resource strain: Not on file  . Food insecurity:    Worry: Not on file    Inability: Not on file  . Transportation needs:    Medical: Not on file    Non-medical: Not on file  Tobacco Use  . Smoking status: Never Smoker  . Smokeless tobacco: Never Used  Substance and Sexual Activity  . Alcohol use: No  . Drug use: No  . Sexual activity: Never  Lifestyle  .  Physical activity:    Days per week: Not on file    Minutes per session: Not on file  . Stress: Not on file  Relationships  . Social connections:    Talks on phone: Not on file    Gets together: Not on file    Attends religious service: Not on file    Active member of club or organization: Not on file    Attends meetings of clubs or organizations: Not on file    Relationship status: Not on file  . Intimate partner violence:    Fear of current or ex partner: Not on file    Emotionally abused: Not on file    Physically abused: Not on file    Forced sexual activity: Not on file  Other Topics Concern  . Not on file  Social History Narrative   Tobacco use, amount per day now: 0      Past tobacco use, amount per day: 0      How many years did you use tobacco: 0      Alcohol use (drinks per week): 0      Diet:      Do you drink/eat things with caffeine? Yes      Marital status: Married            What year were you married? 1946      Do you live in a house, apartment, assisted living, condo, trailer? Apartment       Is it one or more stories? 1      How many persons live in your home? 2      Do you have any pets in your home? 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 Yes            How often? 3-4 times a week       Do you have a living will? Yes      Do you have a DNR form?            If not, do you want to discuss one?      Do you have signed POA/HPOA forms? Yes              Functional Status Survey:    Family History  Problem Relation Age of Onset  . Stroke Father   . Heart disease Father   . Arthritis Father   . Heart disease Mother   . Arthritis Mother   . Stomach cancer Brother   . Stomach cancer Brother   . Liver cancer Brother   . Breast cancer Sister     Health Maintenance  Topic Date Due  . Samul Dada  04/29/1942  . DEXA  SCAN  04/29/1990  . PNA vac Low Risk Adult (1 of 2 - PCV13) 04/29/1990  . INFLUENZA VACCINE   12/12/2017    Allergies  Allergen Reactions  . Alendronate Other (See Comments)    Can not tolerate  . Celebrex [Celecoxib] Other (See Comments)    Can not tolerate  . Meloxicam Other (See Comments)    Avoid   . Myrbetriq [Mirabegron] Other (See Comments)    Can not tolerate  . Norco [Hydrocodone-Acetaminophen] Other (See Comments)    Dizziness and jerking  . Pradaxa [Dabigatran Etexilate Mesylate] Other (See Comments)    Can not tolerate  . Toviaz [Fesoterodine Fumarate Er] Other (See Comments)    Can not tolerate  . Vesicare [Solifenacin] Other (See Comments)    Can not tolerate    Outpatient Encounter Medications as of 09/02/2017  Medication Sig  . Benfotiamine 150 MG CAPS Take 150 mg by mouth daily.  . Calcium Carb-Cholecalciferol (CALCIUM 600+D3) 600-800 MG-UNIT TABS Take 1 tablet by mouth daily.  . cholecalciferol (VITAMIN D) 1000 UNITS tablet Take 1,000 Units by mouth daily.   [name] Dextromethorphan-Guaifenesin (MUCUS RELIEF DM PO) Take 1 tablet by mouth daily.  [name] donepezil (ARICEPT) 5 MG tablet Take 5 mg by mouth at bedtime.  . gabapentin (NEURONTIN) 300 MG capsule Take 1 capsule (300 mg total) by mouth 2 (two) times daily.  [name] levothyroxine (SYNTHROID, LEVOTHROID) 50 MCG tablet Take 50 mcg by mouth daily before breakfast.  . loratadine (CLARITIN) 10 MG tablet Take 10 mg by mouth daily.   . memantine (NAMENDA) 10 MG tablet Take 5 mg by mouth 2 (two) times daily.   . Multiple Vitamins-Minerals (PRESERVISION AREDS 2 PO) Take 1 capsule by mouth 2 (two) times daily.  [name] NITROSTAT 0.4 MG SL tablet Place 0.4 mg under the tongue every 5 (five) minutes as needed for chest pain (MAX 3 TABLETS).   [name] omeprazole (PRILOSEC) 20 MG capsule Take 20 mg by mouth 2 (two) times daily.   . pravastatin (PRAVACHOL) 20 MG tablet Take 20 mg by mouth daily.    . Probiotic Product (PROBIOTIC DAILY PO) Take 1 capsule by mouth daily.  Orlie Dakin Sodium (SENNA-S PO) Take 1 tablet by mouth as  needed.   . sertraline (ZOLOFT) 25 MG tablet Take 25 mg by mouth daily. In addition to 50mg  for a total of 75mg   . sertraline (ZOLOFT) 50 MG tablet Take 50 mg by mouth daily. In addition to 25mg  for total of 75mg   . warfarin (COUMADIN) 5 MG tablet Take as directed by coumadin clinic   No facility-administered encounter medications on file as of 09/02/2017.     Review of Systems  Constitutional: Negative for appetite change, chills, diaphoresis, fatigue and fever.  HENT: Positive for hearing loss, postnasal drip and rhinorrhea. Negative for congestion, ear discharge, ear pain, mouth sores, nosebleeds, sinus pressure, sinus pain and trouble swallowing.        Has hearing aids  Eyes: Positive for visual disturbance. Negative for pain and itching.       Wears corrective glasses, seen by her eye doctor, has macular degeneration, s/p cataract surgery  Respiratory: Negative for cough, shortness of breath and wheezing.   Cardiovascular: Positive for leg swelling. Negative for chest pain and palpitations.  Gastrointestinal: Negative for abdominal pain, blood in stool, constipation, nausea and vomiting.       Increased frequency of bowel movement intermittently for last 2 weeks, no blood in stool, no abdominal pain or cramp. Mostly  occurs after meals  Genitourinary: Positive for frequency. Negative for dysuria, flank pain, hematuria and pelvic pain.       Uses pads at night and wakes up twice at night to urinate  Musculoskeletal: Positive for arthralgias and gait problem.       Has a walker and denies any falls  Skin: Negative for rash and wound.  Neurological: Positive for dizziness and tremors. Negative for seizures, syncope, numbness and headaches.       Takes meclizine as needed, last taken 1 week back. About a month back, in her apartment, she had a possible episode of passing out and regained consciousness by herself. ED visit was recommended, patient and family declined. Patient was just sitting  in dining area. No recurrence of symptoms   Hematological: Bruises/bleeds easily.  Psychiatric/Behavioral: Positive for confusion and dysphoric mood. Negative for behavioral problems, sleep disturbance and suicidal ideas.    Vitals:   09/02/17 1332  BP: 118/62  Pulse: 80  Resp: 16  Temp: 97.7 F (36.5 C)  TempSrc: Oral  SpO2: 90%  Weight: 148 lb 3.2 oz (67.2 kg)  Height: 5\' 1"  (1.549 m)   Body mass index is 28 kg/m. Physical Exam  Constitutional: She is oriented to person, place, and time. She appears well-developed. No distress.  Overweight, elderly, pleasant female  HENT:  Head: Normocephalic and atraumatic.  Nose: Nose normal.  Mouth/Throat: Oropharynx is clear and moist. No oropharyngeal exudate.  Eyes: Pupils are equal, round, and reactive to light. Conjunctivae and EOM are normal. Right eye exhibits no discharge. Left eye exhibits no discharge.  Neck: Normal range of motion. Neck supple.  Cardiovascular: Normal rate and regular rhythm.  Murmur heard. Pulmonary/Chest: Effort normal and breath sounds normal. No respiratory distress. She has no wheezes. She has no rales. She exhibits no tenderness.  Abdominal: Soft. Bowel sounds are normal. There is no tenderness. There is no rebound.  Musculoskeletal: She exhibits edema. She exhibits no tenderness.  Able to move all 4 extremities, unsteady gait, uses walker, has arthritis changes to fingers, edema to both legs, left leg edema > right leg mainly on her feet  Lymphadenopathy:    She has no cervical adenopathy.  Neurological: She is alert and oriented to person, place, and time. She exhibits normal muscle tone.  Skin: Skin is warm and dry. She is not diaphoretic.  Easy bruising  Psychiatric: She has a normal mood and affect.    Labs reviewed: Basic Metabolic Panel: No results for input(s): NA, K, CL, CO2, GLUCOSE, BUN, CREATININE, CALCIUM, MG, PHOS in the last 8760 hours. Liver Function Tests: No results for input(s):  AST, ALT, ALKPHOS, BILITOT, PROT, ALBUMIN in the last 8760 hours. No results for input(s): LIPASE, AMYLASE in the last 8760 hours. No results for input(s): AMMONIA in the last 8760 hours. CBC: No results for input(s): WBC, NEUTROABS, HGB, HCT, MCV, PLT in the last 8760 hours. Cardiac Enzymes: No results for input(s): CKTOTAL, CKMB, CKMBINDEX, TROPONINI in the last 8760 hours. BNP: Invalid input(s): POCBNP No results found for: HGBA1C Lab Results  Component Value Date   TSH 0.43 02/12/2011   Lab Results  Component Value Date   VITAMINB12 447 08/27/2009   Lab Results  Component Value Date   FOLATE  08/27/2009    18.8 (NOTE)  Reference Ranges        Deficient:       0.4 - 3.3 ng/mL        Indeterminate:   3.4 - 5.4  ng/mL        Normal:              > 5.4 ng/mL   No results found for: IRON, TIBC, FERRITIN  Lipid Panel: No results for input(s): CHOL, HDL, LDLCALC, TRIG, CHOLHDL, LDLDIRECT in the last 8760 hours. No results found for: HGBA1C  Procedures since last visit: No results found.  Assessment/Plan  1. Paroxysmal atrial fibrillation (HCC) Continue warfarin, s/p pacemaker. monitor  2. ATRIOVENTRICULAR BLOCK, 3RD DEGREE S/p pacemaker  3. Rhinitis, chronic Continue loratadine and mucinex  4. Long term (current) use of anticoagulants Continue warfarin, followed in coumadin clinic. Goal inr 2-3  5. History of DVT (deep vein thrombosis) C/w warfarin, left leg edema > right leg edema. Advised to keep legs elevated at rest  6. Acquired hypothyroidism Continue levothyroxine, check her prior tsh values  7. Involuntary movements Reviewed neurology note, continue gabapentin and zoloft  8. Cognitive impairment Continue aricept and memantine, aaox 3 this visit  9. Gastroesophageal reflux disease, esophagitis presence not specified C/w PPI  10. Hyperlipidemia, unspecified hyperlipidemia type Continue statin, review prior lipid panel    Labs/tests ordered:  None,  to review prior records first  Next appointment: 3 months  Communication: reviewed care plan with patient and her husband    [name], MD Internal Medicine [place], [place] [address] Cell Phone (Monday-Friday 8 am - 5 pm): [phone] On Call: [phone] and follow prompts after 5 pm and on weekends Office Phone: [phone] Office Fax: [phone]

## 2017-09-04 ENCOUNTER — Ambulatory Visit (INDEPENDENT_AMBULATORY_CARE_PROVIDER_SITE_OTHER): Payer: Medicare Other | Admitting: *Deleted

## 2017-09-04 DIAGNOSIS — I442 Atrioventricular block, complete: Secondary | ICD-10-CM

## 2017-09-04 NOTE — Progress Notes (Signed)
Remote pacemaker transmission.   

## 2017-09-05 LAB — CUP PACEART REMOTE DEVICE CHECK
Battery Impedance: 660 Ohm
Battery Remaining Longevity: 76 mo
Battery Voltage: 2.78 V
Brady Statistic AP VP Percent: 2 %
Brady Statistic AP VS Percent: 0 %
Brady Statistic AS VP Percent: 98 %
Brady Statistic AS VS Percent: 0 %
Date Time Interrogation Session: 20190424130933
Implantable Lead Implant Date: 19980518
Implantable Lead Implant Date: 20040301
Implantable Lead Location: 753859
Implantable Lead Location: 753860
Implantable Lead Model: 4269
Implantable Lead Model: 4285
Implantable Lead Serial Number: 249440
Implantable Lead Serial Number: 295540
Implantable Pulse Generator Implant Date: 20111206
Lead Channel Impedance Value: 583 Ohm
Lead Channel Impedance Value: 844 Ohm
Lead Channel Pacing Threshold Amplitude: 1.5 V
Lead Channel Pacing Threshold Amplitude: 1.625 V
Lead Channel Pacing Threshold Pulse Width: 0.4 ms
Lead Channel Pacing Threshold Pulse Width: 0.4 ms
Lead Channel Setting Pacing Amplitude: 2 V
Lead Channel Setting Pacing Amplitude: 2.5 V
Lead Channel Setting Pacing Pulse Width: 0.4 ms
Lead Channel Setting Sensing Sensitivity: 5.6 mV

## 2017-09-06 ENCOUNTER — Encounter: Payer: Self-pay | Admitting: Family

## 2017-09-06 ENCOUNTER — Encounter: Payer: Self-pay | Admitting: Cardiology

## 2017-09-06 ENCOUNTER — Ambulatory Visit
Admission: RE | Admit: 2017-09-06 | Discharge: 2017-09-06 | Disposition: A | Discharge status: Home / Self Care | Payer: Medicare Other | Source: Ambulatory Visit | Attending: Family | Admitting: Family

## 2017-09-06 ENCOUNTER — Encounter: Payer: Self-pay | Admitting: *Deleted

## 2017-09-06 ENCOUNTER — Non-Acute Institutional Stay: Payer: Medicare Other | Admitting: Family

## 2017-09-06 VITALS — BP 118/60 | HR 90 | Temp 98.0°F | Resp 16 | Ht 61.0 in | Wt 148.2 lb

## 2017-09-06 DIAGNOSIS — K6289 Other specified diseases of anus and rectum: Secondary | ICD-10-CM

## 2017-09-06 DIAGNOSIS — K529 Noninfective gastroenteritis and colitis, unspecified: Secondary | ICD-10-CM

## 2017-09-06 DIAGNOSIS — R194 Change in bowel habit: Secondary | ICD-10-CM | POA: Diagnosis not present

## 2017-09-06 DIAGNOSIS — R609 Edema, unspecified: Secondary | ICD-10-CM | POA: Diagnosis not present

## 2017-09-06 MED ORDER — ZINC OXIDE 20 % EX OINT: 1.0000 "application " | TOPICAL_OINTMENT | CUTANEOUS | 0 refills | Status: DC | PRN

## 2017-09-06 MED ORDER — FUROSEMIDE 20 MG PO TABS: 20.0000 mg | ORAL_TABLET | Freq: Every day | ORAL | 0 refills | Status: DC | PRN

## 2017-09-06 NOTE — Progress Notes (Signed)
Location:  [place] of Service:  Clinic (12) Provider: Jonda Alanis FNP-C  [name], MD  Patient Care Team: [name], MD as PCP - General (Internal Medicine)  Extended Emergency Contact Information Primary Emergency Contact: Avitabile,[name] Address: [place], [place] Phone: [phone] Relation: Spouse Secondary Emergency Contact: [name] Address: [address]          [place], [place] [address] [name] of [place] Phone: [phone] Relation: Daughter  Code Status:  DNR Goals of care: Advanced Directive information Advanced Directives 09/06/2017  Does Patient Have a Medical Advance Directive? Yes  Type of Advance Directive [place]  Does patient want to make changes to medical advance directive? -  Copy of [place] in Chart? Yes     Chief Complaint  Patient presents with  . Acute Visit    multiple BM's daily. Small, not loose-just frequent    HPI:  Pt is a 84 y.o. female seen today at [facility] clinic for an acute visit for evaluation of frequent small stool x 2 weeks.she is seen today with Husband at bedside who contributes to patient's HPI and ROS.Patient states has had small frequent stool.she illustrates by size of her finger.Husband reports patient had a large BM yesterday but continues to have small stool cannot make it to the bathroom sometimes.symptoms are worst especially after meals.she states her rectal area has been irritated and painful due to frequent wiping.she has applied preparation H without relief.she denies nausea,vomiting,bloating, or blood in the stool.she does states some bilateral lower abdominal pain that seems chronic in nature since post her bladder tuck several years ago otherwise overall denies abdominal pain.she states has a very good appetite.  She also complains of worsening lower  extremities edema.Patient's husband states he has been giving her his half tablet of his Furosemide ( He takes 40 mg tablet but gives her 20 mg tablet). She has ted hose but has not worn them in a while.she denies any cough,shortness of breath or abrupt weight gain.    Past Medical History:  Diagnosis Date  . Arthritis   . Atrial fibrillation ([place])   . Dementia   . Depression   . DVT (deep venous thrombosis) ([place]) 2011  . HB (heart block)    s/p PPM, most recent generator change 12/11 by JA  . Hiatal hernia   . Hyperlipidemia   . Hypothyroidism   . PTE (post-transplant erythrocytosis)   . Pulmonary embolism ([place]) 2011   on coumadin  . UTI (lower urinary tract infection)    Past Surgical History:  Procedure Laterality Date  . CHOLECYSTECTOMY  09/2010  . HEMORRHOID SURGERY    . hysterectomy (other)    . JOINT REPLACEMENT    . pacemaker     most recent generator 12/11 by JA  . TOTAL KNEE ARTHROPLASTY Left     Allergies  Allergen Reactions  . Alendronate Other (See Comments)    Can not tolerate  . Celebrex [Celecoxib] Other (See Comments)    Can not tolerate  . Meloxicam Other (See Comments)    Avoid   . Myrbetriq [Mirabegron] Other (See Comments)    Can not tolerate  . Norco [Hydrocodone-Acetaminophen] Other (See Comments)    Dizziness and jerking  . Pradaxa [Dabigatran Etexilate Mesylate] Other (See Comments)    Can not tolerate  .  Toviaz [Fesoterodine Fumarate Er] Other (See Comments)    Can not tolerate  . Vesicare [Solifenacin] Other (See Comments)    Can not tolerate    Outpatient Encounter Medications as of 09/06/2017  Medication Sig  . Benfotiamine 150 MG CAPS Take 150 mg by mouth daily.  . Calcium Carb-Cholecalciferol (CALCIUM 600+D3) 600-800 MG-UNIT TABS Take 1 tablet by mouth daily.  . cholecalciferol (VITAMIN D) 1000 UNITS tablet Take 1,000 Units by mouth daily.   [name] Dextromethorphan-Guaifenesin (MUCUS RELIEF DM PO) Take 1 tablet by mouth daily.  [name]  donepezil (ARICEPT) 5 MG tablet Take 5 mg by mouth at bedtime.  . gabapentin (NEURONTIN) 300 MG capsule Take 1 capsule (300 mg total) by mouth 2 (two) times daily.  [name] levothyroxine (SYNTHROID, LEVOTHROID) 50 MCG tablet Take 50 mcg by mouth daily before breakfast.  . loratadine (CLARITIN) 10 MG tablet Take 10 mg by mouth daily.   . meclizine (ANTIVERT) 25 MG tablet Take 25 mg by mouth 2 (two) times daily as needed for dizziness.  . memantine (NAMENDA) 10 MG tablet Take 5 mg by mouth 2 (two) times daily.   . Multiple Vitamins-Minerals (PRESERVISION AREDS 2 PO) Take 1 capsule by mouth 2 (two) times daily.  [name] NITROSTAT 0.4 MG SL tablet Place 0.4 mg under the tongue every 5 (five) minutes as needed for chest pain (MAX 3 TABLETS).   [name] omeprazole (PRILOSEC) 20 MG capsule Take 20 mg by mouth 2 (two) times daily.   . pravastatin (PRAVACHOL) 20 MG tablet Take 20 mg by mouth daily.    . Probiotic Product (PROBIOTIC DAILY PO) Take 1 capsule by mouth daily.  . sertraline (ZOLOFT) 25 MG tablet Take 1 tablet (25 mg total) by mouth daily. In addition to 50mg  for a total of 75mg   . sertraline (ZOLOFT) 50 MG tablet Take 50 mg by mouth daily. In addition to 25mg  for total of 75mg   . warfarin (COUMADIN) 5 MG tablet Take as directed by coumadin clinic  . Sennosides-Docusate Sodium (SENNA-S PO) Take 1 tablet by mouth as needed.    No facility-administered encounter medications on file as of 09/06/2017.     Review of Systems  Constitutional: Negative for appetite change, chills, fatigue, fever and unexpected weight change.  Respiratory: Negative for cough, chest tightness, shortness of breath and wheezing.   Cardiovascular: Positive for leg swelling. Negative for chest pain and palpitations.  Gastrointestinal: Negative for abdominal distention, nausea, rectal pain and vomiting.       Frequent small stool per HPI   Endocrine: Negative for cold intolerance and heat intolerance.  Genitourinary: Negative for dysuria,  flank pain, frequency and urgency.       Urine leaks sometimes wears pull ups diapers.  Musculoskeletal: Positive for gait problem.  Skin: Negative for color change, pallor and rash.       Rectal area skin irritation  Hematological: Does not bruise/bleed easily.  Psychiatric/Behavioral: Negative for agitation, confusion and sleep disturbance. The patient is not nervous/anxious.     Immunization History  Administered Date(s) Administered  . Influenza Whole 01/17/2011  . Influenza, High Dose Seasonal PF 01/10/2017  . Influenza-Unspecified 02/24/2014  . Pneumococcal Polysaccharide-23 02/17/2009  . Pneumococcal-Unspecified 02/17/2009  . Tdap 02/12/2012   Pertinent  Health Maintenance Due  Topic Date Due  . DEXA SCAN  04/29/1990  . PNA vac Low Risk Adult (2 of 2 - PCV13) 02/17/2010  . INFLUENZA VACCINE  12/12/2017   No flowsheet data found.    Vitals:  09/06/17 0923  BP: 118/60  Pulse: 90  Resp: 16  Temp: 98 F (36.7 C)  TempSrc: Oral  SpO2: 93%  Weight: 148 lb 3.2 oz (67.2 kg)  Height: 5\' 1"  (1.549 m)   Body mass index is 28 kg/m. Physical Exam  Constitutional: She is oriented to person, place, and time. She appears well-developed.  Elderly in no acute distress  HENT:  Head: Normocephalic.  Mouth/Throat: Oropharynx is clear and moist. No oropharyngeal exudate.  Cardiovascular: Intact distal pulses. Exam reveals no gallop and no friction rub.  Murmur heard. Pulmonary/Chest: Effort normal and breath sounds normal. No stridor. No respiratory distress. She has no wheezes. She has no rales.  Abdominal: Soft. She exhibits no distension and no mass. There is no tenderness. There is no rebound and no guarding.  Hyperactive bowel sounds x 4 quadrant  Genitourinary: Rectum normal. Rectal exam shows no external hemorrhoid, no internal hemorrhoid, no mass and no tenderness.  Musculoskeletal: She exhibits no tenderness.  Unsteady gait uses Rolator.bilateral lower extremities  pitting edema.  Neurological: She is oriented to person, place, and time. Gait abnormal.  Skin: Skin is warm and dry. No rash noted. No pallor.  Rectal area and gluteal skin irritation tender to touch.  Psychiatric: She has a normal mood and affect. Her behavior is normal.    Labs reviewed:  Lab Results  Component Value Date   TSH 0.43 02/12/2011   No results found for: HGBA1C Lab Results  Component Value Date   CHOL  08/25/2009    141        ATP III CLASSIFICATION:  <200     mg/dL   Desirable  200-239  mg/dL   Borderline High  >=240    mg/dL   High          HDL 40 08/25/2009   LDLCALC  08/25/2009    83        Total Cholesterol/HDL:CHD Risk Coronary Heart Disease Risk Table                     Men   Women  1/2 Average Risk   3.4   3.3  Average Risk       5.0   4.4  2 X Average Risk   9.6   7.1  3 X Average Risk  23.4   11.0        Use the calculated Patient Ratio above and the CHD Risk Table to determine the patient's CHD Risk.        ATP III CLASSIFICATION (LDL):  <100     mg/dL   Optimal  100-129  mg/dL   Near or Above                    Optimal  130-159  mg/dL   Borderline  160-189  mg/dL   High  >190     mg/dL   Very High   TRIG 90 08/25/2009   CHOLHDL 3.5 08/25/2009    Significant Diagnostic Results in last 30 days:  No results found.  Assessment/Plan 1. Frequent stools Frequent small stool.No abdominal pain,nausea,vomiting or diarrhea.on exam hyperactive bowel sounds to all quadrant.Abd non-distended and not tender to palpation.rectal exam negative.Has senna but stopped taking due to frequent small stool.will obtain abdominal KUB rule out constipation verse ileus.Will refer to GI for evaluation if symptoms persist.   2. Edema Bilateral lower extremities pitting edema worsening.she has taken husbands Furosemide with some relief.No abrupt weight  gain,shortness of breath or weight gain .Start on Furosemide 20 mg tablet daily by mouth as needed for abrupt  weight gain,shortness of breath or worsening edema.Monitor BMP 08/20/2017.Keep legs elevated while seated.    3.Rectal skin irritation  Skin irritation tender to touch.suscpect from frequent wiping.apply zinc oxide topically daily as needed. Encouraged patting irritated area with wipes instead of wiping hard.  Family/ staff Communication: Reviewed plan of care with patient and Husband.   Labs/tests ordered: BMP 08/20/2017.KUB today.   Sandrea Hughs, NP

## 2017-09-06 NOTE — Patient Instructions (Signed)
1. Furosamide 20 mg tablet one by mouth daily as needed for increased shortness of breath,worsening edema or abrupt weight gain.   2. Elevate legs when sitting.  3. Stop taking Senna tablet ( stool softeners while having frequent stool but may restart if no bowel movement for more than three days.   4. Wear Knee high ted hose on in the morning and off at bedtime.  5. Apply Zinc oxide topically to irritated rectum area and buttock daily and as needed.

## 2017-09-09 ENCOUNTER — Ambulatory Visit (INDEPENDENT_AMBULATORY_CARE_PROVIDER_SITE_OTHER): Payer: Self-pay

## 2017-09-09 ENCOUNTER — Encounter: Payer: Self-pay | Admitting: *Deleted

## 2017-09-09 ENCOUNTER — Encounter (INDEPENDENT_AMBULATORY_CARE_PROVIDER_SITE_OTHER): Payer: Self-pay | Admitting: Specialist

## 2017-09-09 ENCOUNTER — Ambulatory Visit (INDEPENDENT_AMBULATORY_CARE_PROVIDER_SITE_OTHER): Payer: Medicare Other | Admitting: Specialist

## 2017-09-09 ENCOUNTER — Ambulatory Visit (INDEPENDENT_AMBULATORY_CARE_PROVIDER_SITE_OTHER): Payer: Medicare Other | Admitting: Pharmacist

## 2017-09-09 VITALS — BP 134/73 | HR 88 | Ht 60.0 in | Wt 136.0 lb

## 2017-09-09 DIAGNOSIS — Z5181 Encounter for therapeutic drug level monitoring: Secondary | ICD-10-CM | POA: Diagnosis not present

## 2017-09-09 DIAGNOSIS — I480 Paroxysmal atrial fibrillation: Secondary | ICD-10-CM | POA: Diagnosis not present

## 2017-09-09 DIAGNOSIS — M170 Bilateral primary osteoarthritis of knee: Secondary | ICD-10-CM | POA: Diagnosis not present

## 2017-09-09 DIAGNOSIS — M25561 Pain in right knee: Secondary | ICD-10-CM

## 2017-09-09 DIAGNOSIS — I4891 Unspecified atrial fibrillation: Secondary | ICD-10-CM | POA: Diagnosis not present

## 2017-09-09 DIAGNOSIS — Z7901 Long term (current) use of anticoagulants: Secondary | ICD-10-CM | POA: Diagnosis not present

## 2017-09-09 DIAGNOSIS — M5431 Sciatica, right side: Secondary | ICD-10-CM

## 2017-09-09 LAB — POCT INR: INR: 3.3

## 2017-09-09 MED ORDER — BUPIVACAINE HCL 0.25 % IJ SOLN
4.0000 mL | INTRAMUSCULAR | Status: AC | PRN
  Administered 2017-09-09: 4 mL via INTRA_ARTICULAR

## 2017-09-09 MED ORDER — METHYLPREDNISOLONE ACETATE 40 MG/ML IJ SUSP
40.0000 mg | INTRAMUSCULAR | Status: AC | PRN
  Administered 2017-09-09: 40 mg via INTRA_ARTICULAR

## 2017-09-09 NOTE — Progress Notes (Signed)
Office Visit Note   Patient: [name]           Date of Birth: 1922/11/20           MRN: [id number] Visit Date: 09/09/2017              Requested by: [name], [place], [place] [employer] [place] [place], [place] [address] PCP: [name], MD   Assessment & Plan: Visit Diagnoses:  1. Acute pain of right knee   2. Sciatica of right side without back pain     Plan: Avoid frequent bending and stooping  No lifting greater than 10 lbs. May use ice or moist heat for pain. Weight loss is of benefit.  Knee is suffering from osteoarthritis, only real proven treatments are Weight loss, NSIADs like tylenol and exercise. Well padded shoes help. Ice the knee 2-3 times a day 15-20 mins at a time  Follow-Up Instructions: Return in about 2 weeks (around 09/23/2017).   Orders:  Orders Placed This Encounter  Procedures  . Large Joint Inj  . Large Joint Inj   No orders of the defined types were placed in this encounter.     Procedures: Large Joint Inj: R knee on 09/09/2017 5:26 PM Indications: pain Details: 25 G 1.5 in needle, anterolateral approach  Arthrogram: No  Medications: 40 mg methylPREDNISolone acetate 40 MG/ML; 4 mL bupivacaine 0.25 % Outcome: tolerated well, no immediate complications  Bandaid applied. Procedure, treatment alternatives, risks and benefits explained, specific risks discussed. Consent was given by the patient. Immediately prior to procedure a time out was called to verify the correct patient, procedure, equipment, support staff and site/side marked as required. Patient was prepped and draped in the usual sterile fashion.       Clinical Data: No additional findings.   Subjective: Chief Complaint  Patient presents with  . Lower Back - Follow-up  . Right Leg - Pain    84 year old female with history of left shoulder bursitis and tendonitis and also has had injection in the left shoulder and left greater trochanter. Dr. Louretta Shorten at  [facility] injected the left shoulder and left greater trochanter. Now she has been experiencing right knee pain laterally     Review of Systems   Objective: Vital Signs: BP 134/73 (BP Location: Left Arm, Patient Position: Sitting)   Pulse 88   Ht 5' (1.524 m)   Wt 136 lb (61.7 kg)   BMI 26.56 kg/m   Physical Exam  Ortho Exam  Specialty Comments:  No specialty comments available.  Imaging: No results found.   PMFS History: Patient Active Problem List   Diagnosis Date Noted  . History of DVT (deep vein thrombosis) 09/02/2017  . Involuntary movements 09/02/2017  . Acquired hypothyroidism 09/02/2017  . Cognitive impairment 09/02/2017  . Encounter for therapeutic drug monitoring 11/23/2013  . Pacemaker-Medtronic 03/11/2012  . Edema 09/28/2011  . Long term (current) use of anticoagulants 05/23/2011  . Atypical chest pain 02/12/2011  . Fatigue 02/12/2011  . Rhinitis, chronic 01/17/2011  . Bronchitis, chronic ([place]) 12/15/2010  . Back pain 11/06/2010  . Hypotension 11/06/2010  . PAROXYSMAL ATRIAL FIBRILLATION 07/26/2010  . ATRIOVENTRICULAR BLOCK, 3RD DEGREE 04/12/2010  . DYSPHAGIA 12/14/2008   Past Medical History:  Diagnosis Date  . Arthritis   . Atrial fibrillation ([place])   . Dementia   . Depression   . DVT (deep venous thrombosis) ([place]) 2011  . HB (heart block)    s/p PPM, most recent  generator change 12/11 by Greggory Brandy  . Hiatal hernia   . Hyperlipidemia   . Hypothyroidism   . PTE (post-transplant erythrocytosis)   . Pulmonary embolism ([place]) 2011   on coumadin  . UTI (lower urinary tract infection)     Family History  Problem Relation Age of Onset  . Stroke Father   . Heart disease Father   . Arthritis Father   . Heart disease Mother   . Arthritis Mother   . Stomach cancer Brother   . Stomach cancer Brother   . Liver cancer Brother   . Breast cancer Sister     Past Surgical History:  Procedure Laterality Date  . CHOLECYSTECTOMY   09/2010  . HEMORRHOID SURGERY    . hysterectomy (other)    . JOINT REPLACEMENT    . pacemaker     most recent generator 12/11 by JA  . TOTAL KNEE ARTHROPLASTY Left    Social History   Occupational History  . Occupation: retired bookkeeper  Tobacco Use  . Smoking status: Never Smoker  . Smokeless tobacco: Never Used  Substance and Sexual Activity  . Alcohol use: No  . Drug use: No  . Sexual activity: Never

## 2017-09-09 NOTE — Patient Instructions (Signed)
Avoid frequent bending and stooping  No lifting greater than 10 lbs. May use ice or moist heat for pain. Weight loss is of benefit.  Knee is suffering from osteoarthritis, only real proven treatments are Weight loss, NSIADs like tylenol and exercise. Well padded shoes help. Ice the knee 2-3 times a day 15-20 mins at a time.

## 2017-09-10 ENCOUNTER — Ambulatory Visit (INDEPENDENT_AMBULATORY_CARE_PROVIDER_SITE_OTHER): Payer: Medicare Other

## 2017-09-13 ENCOUNTER — Telehealth: Payer: Self-pay | Admitting: *Deleted

## 2017-09-13 DIAGNOSIS — K591 Functional diarrhea: Secondary | ICD-10-CM | POA: Diagnosis not present

## 2017-09-13 NOTE — Telephone Encounter (Signed)
Pt's husband called stating [name] is having diarrhea and has been ordered to take Imodium and be on a  bland diet . Instructed to give her the Imodium as ordered and if she is continuing to have diarrhea on Monday to call back and will get [facility] to recheck INR on Monday She is scheduled to have INR checked on May 9th [name] states understanding

## 2017-09-16 ENCOUNTER — Telehealth: Payer: Self-pay | Admitting: *Deleted

## 2017-09-16 NOTE — Telephone Encounter (Signed)
Spoke with pt's husband and he states that her diarrhea is better and she went back on her regular diet on Sunday and has had no more diarrhea Instructed  to keep appt to have her INR checked on Thursday as scheduled and he states understanding

## 2017-09-17 ENCOUNTER — Other Ambulatory Visit: Payer: Self-pay | Admitting: *Deleted

## 2017-09-17 MED ORDER — MECLIZINE HCL 25 MG PO TABS: 25.0000 mg | ORAL_TABLET | Freq: Two times a day (BID) | ORAL | 1 refills | Status: DC | PRN

## 2017-09-19 ENCOUNTER — Ambulatory Visit (INDEPENDENT_AMBULATORY_CARE_PROVIDER_SITE_OTHER): Payer: Medicare Other | Admitting: Cardiology

## 2017-09-19 DIAGNOSIS — I4891 Unspecified atrial fibrillation: Secondary | ICD-10-CM | POA: Diagnosis not present

## 2017-09-19 DIAGNOSIS — I480 Paroxysmal atrial fibrillation: Secondary | ICD-10-CM | POA: Diagnosis not present

## 2017-09-19 DIAGNOSIS — Z5181 Encounter for therapeutic drug level monitoring: Secondary | ICD-10-CM | POA: Diagnosis not present

## 2017-09-19 DIAGNOSIS — Z7901 Long term (current) use of anticoagulants: Secondary | ICD-10-CM | POA: Diagnosis not present

## 2017-09-19 LAB — PROTIME-INR: INR: 2.1 — AB (ref 0.9–1.1)

## 2017-09-19 NOTE — Patient Instructions (Signed)
Description   Spoke with pt's husband and instructed him to have pt continue taking the dose she has been taking which is 5mg s daily except 7.5mg  on Fridays.  Recheck in 2 weeks.  Orders faxed to [facility].

## 2017-10-03 ENCOUNTER — Ambulatory Visit (INDEPENDENT_AMBULATORY_CARE_PROVIDER_SITE_OTHER): Payer: Medicare Other | Admitting: Interventional Cardiology

## 2017-10-03 DIAGNOSIS — Z5181 Encounter for therapeutic drug level monitoring: Secondary | ICD-10-CM

## 2017-10-03 DIAGNOSIS — I480 Paroxysmal atrial fibrillation: Secondary | ICD-10-CM

## 2017-10-03 DIAGNOSIS — I4891 Unspecified atrial fibrillation: Secondary | ICD-10-CM | POA: Diagnosis not present

## 2017-10-03 DIAGNOSIS — Z7901 Long term (current) use of anticoagulants: Secondary | ICD-10-CM | POA: Diagnosis not present

## 2017-10-03 LAB — PROTIME-INR: INR: 2.6 — AB (ref 0.9–1.1)

## 2017-10-09 DIAGNOSIS — M810 Age-related osteoporosis without current pathological fracture: Secondary | ICD-10-CM | POA: Diagnosis not present

## 2017-10-15 DIAGNOSIS — H353221 Exudative age-related macular degeneration, left eye, with active choroidal neovascularization: Secondary | ICD-10-CM | POA: Diagnosis not present

## 2017-10-17 DIAGNOSIS — M25561 Pain in right knee: Secondary | ICD-10-CM | POA: Diagnosis not present

## 2017-10-17 DIAGNOSIS — R195 Other fecal abnormalities: Secondary | ICD-10-CM | POA: Diagnosis not present

## 2017-10-21 ENCOUNTER — Telehealth: Payer: Self-pay | Admitting: *Deleted

## 2017-10-21 DIAGNOSIS — R159 Full incontinence of feces: Secondary | ICD-10-CM | POA: Diagnosis not present

## 2017-10-21 DIAGNOSIS — R197 Diarrhea, unspecified: Secondary | ICD-10-CM | POA: Diagnosis not present

## 2017-10-21 DIAGNOSIS — Z79899 Other long term (current) drug therapy: Secondary | ICD-10-CM | POA: Diagnosis not present

## 2017-10-21 DIAGNOSIS — R195 Other fecal abnormalities: Secondary | ICD-10-CM | POA: Diagnosis not present

## 2017-10-21 DIAGNOSIS — R152 Fecal urgency: Secondary | ICD-10-CM | POA: Diagnosis not present

## 2017-10-21 DIAGNOSIS — K591 Functional diarrhea: Secondary | ICD-10-CM | POA: Diagnosis not present

## 2017-10-21 NOTE — Telephone Encounter (Signed)
Spoke with pt's husband and he stated she started taking Prednisone 10 mg daily on June 6th Instructed that there is interaction between coumadin and Prednisone but that is low dose of Prednisone and she does have an appt to have INR checked on Thursday June 13th so instructed to have her eat some dark leafy greens and drink Ensure each day until INR checked and to call back if placed on any other medications Mr Cappiello states he is unable to bring her to office to have INR checked sooner  He states understanding of above instructions

## 2017-10-23 ENCOUNTER — Telehealth: Payer: Self-pay | Admitting: *Deleted

## 2017-10-23 NOTE — Telephone Encounter (Signed)
Mr Canizares called stating [name] finished Prednisone 10 mg  on Tuesday but now has been ordered Prednisone 10 mg tabs 4  times 3 days then 1 tab daily for 3 days and stop He stated this medication will be started  today Instructed to get her to eat good serving of greens now and then tonight and be sure she gets her INR checked tomorrow as scheduled and he states understanding

## 2017-10-24 ENCOUNTER — Ambulatory Visit (INDEPENDENT_AMBULATORY_CARE_PROVIDER_SITE_OTHER): Payer: Medicare Other | Admitting: Internal Medicine

## 2017-10-24 DIAGNOSIS — I480 Paroxysmal atrial fibrillation: Secondary | ICD-10-CM

## 2017-10-24 DIAGNOSIS — Z7901 Long term (current) use of anticoagulants: Secondary | ICD-10-CM | POA: Diagnosis not present

## 2017-10-24 DIAGNOSIS — I4891 Unspecified atrial fibrillation: Secondary | ICD-10-CM | POA: Diagnosis not present

## 2017-10-24 DIAGNOSIS — Z5181 Encounter for therapeutic drug level monitoring: Secondary | ICD-10-CM

## 2017-10-24 LAB — PROTIME-INR: INR: 2.8 — AB (ref 0.9–1.1)

## 2017-10-28 ENCOUNTER — Ambulatory Visit (INDEPENDENT_AMBULATORY_CARE_PROVIDER_SITE_OTHER): Payer: Medicare Other | Admitting: Cardiovascular Disease

## 2017-10-28 DIAGNOSIS — Z5181 Encounter for therapeutic drug level monitoring: Secondary | ICD-10-CM

## 2017-10-28 DIAGNOSIS — I4891 Unspecified atrial fibrillation: Secondary | ICD-10-CM | POA: Diagnosis not present

## 2017-10-28 DIAGNOSIS — I480 Paroxysmal atrial fibrillation: Secondary | ICD-10-CM

## 2017-10-28 DIAGNOSIS — Z7901 Long term (current) use of anticoagulants: Secondary | ICD-10-CM | POA: Diagnosis not present

## 2017-10-28 LAB — PROTIME-INR: INR: 2.1 — AB (ref 0.9–1.1)

## 2017-10-29 NOTE — Patient Instructions (Signed)
Description   Spoke with pt's husband and instructed him to have pt resume normal dosage 5mg s daily except 7.5mg  on Fridays.  Pt will complete Prednisone taper tomorrow, has one 10mg  tablet left to take tomorrow.  Recheck in 3 weeks.  Orders faxed to [facility].

## 2017-10-31 DIAGNOSIS — M79604 Pain in right leg: Secondary | ICD-10-CM | POA: Diagnosis not present

## 2017-10-31 DIAGNOSIS — M25551 Pain in right hip: Secondary | ICD-10-CM | POA: Diagnosis not present

## 2017-10-31 DIAGNOSIS — M25561 Pain in right knee: Secondary | ICD-10-CM | POA: Diagnosis not present

## 2017-10-31 DIAGNOSIS — M1990 Unspecified osteoarthritis, unspecified site: Secondary | ICD-10-CM | POA: Diagnosis not present

## 2017-10-31 DIAGNOSIS — M25512 Pain in left shoulder: Secondary | ICD-10-CM | POA: Diagnosis not present

## 2017-10-31 DIAGNOSIS — M545 Low back pain: Secondary | ICD-10-CM | POA: Diagnosis not present

## 2017-10-31 DIAGNOSIS — M7061 Trochanteric bursitis, right hip: Secondary | ICD-10-CM | POA: Diagnosis not present

## 2017-11-05 DIAGNOSIS — F419 Anxiety disorder, unspecified: Secondary | ICD-10-CM | POA: Diagnosis not present

## 2017-11-05 DIAGNOSIS — G253 Myoclonus: Secondary | ICD-10-CM | POA: Diagnosis not present

## 2017-11-05 DIAGNOSIS — F09 Unspecified mental disorder due to known physiological condition: Secondary | ICD-10-CM | POA: Diagnosis not present

## 2017-11-20 DIAGNOSIS — L821 Other seborrheic keratosis: Secondary | ICD-10-CM | POA: Diagnosis not present

## 2017-11-20 DIAGNOSIS — D692 Other nonthrombocytopenic purpura: Secondary | ICD-10-CM | POA: Diagnosis not present

## 2017-11-20 DIAGNOSIS — D1801 Hemangioma of skin and subcutaneous tissue: Secondary | ICD-10-CM | POA: Diagnosis not present

## 2017-11-21 ENCOUNTER — Telehealth: Payer: Self-pay | Admitting: *Deleted

## 2017-11-21 DIAGNOSIS — Z7901 Long term (current) use of anticoagulants: Secondary | ICD-10-CM | POA: Diagnosis not present

## 2017-11-21 DIAGNOSIS — I4891 Unspecified atrial fibrillation: Secondary | ICD-10-CM | POA: Diagnosis not present

## 2017-11-21 LAB — PROTIME-INR: INR: 3.3 — AB (ref 0.9–1.1)

## 2017-11-21 NOTE — Telephone Encounter (Signed)
Received PTT Results from [facility] after pt had them drawn at [facility] today, we need an INR to dose her Warfarin/Coumadin. Called [name] at [facility] was unable to get in touch with her as she order the labs via [place]. Therefore, called [facility] and spoke with [facility] and she stated no INR was ordered; therefore, asked if I could add on an INR and she placed me on hold & returned back and she stated this is time sensitive and that they could and to expect the results to be faxed to [place] in 1-2 days. Add on code she gave was 8847. Called [name] to update them so they are aware. Will await results.

## 2017-11-22 ENCOUNTER — Ambulatory Visit (INDEPENDENT_AMBULATORY_CARE_PROVIDER_SITE_OTHER): Payer: Medicare Other | Admitting: Cardiology

## 2017-11-22 DIAGNOSIS — I480 Paroxysmal atrial fibrillation: Secondary | ICD-10-CM

## 2017-11-22 DIAGNOSIS — Z86718 Personal history of other venous thrombosis and embolism: Secondary | ICD-10-CM | POA: Diagnosis not present

## 2017-11-22 DIAGNOSIS — Z7901 Long term (current) use of anticoagulants: Secondary | ICD-10-CM | POA: Diagnosis not present

## 2017-11-22 DIAGNOSIS — I4891 Unspecified atrial fibrillation: Secondary | ICD-10-CM | POA: Diagnosis not present

## 2017-11-22 DIAGNOSIS — Z5181 Encounter for therapeutic drug level monitoring: Secondary | ICD-10-CM | POA: Diagnosis not present

## 2017-11-22 NOTE — Patient Instructions (Signed)
Description   Spoke with pt's husband and instructed him to have pt hold dose of coumadin today July 12th then continue same dose of coumadin 5mg s daily except 7.5mg  on Fridays.   Recheck in 2 weeks.  Orders faxed to [facility].

## 2017-12-02 ENCOUNTER — Non-Acute Institutional Stay: Payer: Medicare Other | Admitting: Internal Medicine

## 2017-12-02 ENCOUNTER — Encounter: Payer: Self-pay | Admitting: Internal Medicine

## 2017-12-02 VITALS — BP 114/62 | HR 80 | Temp 97.6°F | Resp 18 | Ht 61.0 in | Wt 142.4 lb

## 2017-12-02 DIAGNOSIS — K219 Gastro-esophageal reflux disease without esophagitis: Secondary | ICD-10-CM

## 2017-12-02 DIAGNOSIS — G8929 Other chronic pain: Secondary | ICD-10-CM | POA: Diagnosis not present

## 2017-12-02 DIAGNOSIS — J310 Chronic rhinitis: Secondary | ICD-10-CM

## 2017-12-02 DIAGNOSIS — H6121 Impacted cerumen, right ear: Secondary | ICD-10-CM | POA: Diagnosis not present

## 2017-12-02 DIAGNOSIS — E785 Hyperlipidemia, unspecified: Secondary | ICD-10-CM

## 2017-12-02 DIAGNOSIS — E039 Hypothyroidism, unspecified: Secondary | ICD-10-CM | POA: Diagnosis not present

## 2017-12-02 DIAGNOSIS — M5441 Lumbago with sciatica, right side: Secondary | ICD-10-CM | POA: Diagnosis not present

## 2017-12-02 DIAGNOSIS — I480 Paroxysmal atrial fibrillation: Secondary | ICD-10-CM | POA: Diagnosis not present

## 2017-12-02 DIAGNOSIS — R4189 Other symptoms and signs involving cognitive functions and awareness: Secondary | ICD-10-CM | POA: Diagnosis not present

## 2017-12-02 DIAGNOSIS — Z8679 Personal history of other diseases of the circulatory system: Secondary | ICD-10-CM

## 2017-12-02 DIAGNOSIS — F419 Anxiety disorder, unspecified: Secondary | ICD-10-CM

## 2017-12-02 MED ORDER — CARBAMIDE PEROXIDE 6.5 % OT SOLN: OTIC | 0 refills | Status: DC

## 2017-12-02 NOTE — Progress Notes (Signed)
[name]  Provider: [name] MD   Location:  [place] of Service:  Watts (12)  PCP: [name], MD Patient Care Team: [name], MD as PCP - General (Internal Medicine) [name], [name] Bucks, NP as Nurse Practitioner (Family Medicine) Blanch Media, MD as Consulting Physician (Neurology) Thompson Grayer, MD as Consulting Physician (Cardiology)  Extended Emergency Contact Information Primary Emergency Contact: [name],[name] Address: [place], [place] Phone: [phone] Relation: Spouse Secondary Emergency Contact: [name] Address: [address]          [place], [place] [address] [name] of [place] Phone: [phone] Relation: Daughter   Goals of Care: Advanced Directive information Advanced Directives 12/02/2017  Does Patient Have a Medical Advance Directive? Yes  Type of Advance Directive [place]  Does patient want to make changes to medical advance directive? No - Patient declined  Copy of [place] in Chart? Yes      Chief Complaint  Patient presents with  . Medical Management of Chronic Issues    3 months follow up    HPI: Patient is a 84 y.o. female seen today for routine visit. She is here with her husband.  afib- denies palpitation, takes warfarin  Anxiety- taking sertraline tolerating well. Mood stable.   Hypothyroidism- takes levothyroxine and tolerating it well  Mild cognitive impairment- currently on namenda and donepezil, tolerating well, sees neurology. Also on gabapentin with myoclonic jerks and neurology note from visit in June reviewed.   gerd- has reflux symptom and takes omeprazole at present.    Past Medical History:  Diagnosis Date  . Arthritis   . Atrial fibrillation ([place])   . Dementia   . Depression   . DVT (deep venous thrombosis) ([place]) 2011  . HB (heart block)      s/p PPM, most recent generator change 12/11 by JA  . Hiatal hernia   . Hyperlipidemia   . Hypothyroidism   . PTE (post-transplant erythrocytosis)   . Pulmonary embolism ([place]) 2011   on coumadin  . UTI (lower urinary tract infection)    Past Surgical History:  Procedure Laterality Date  . CHOLECYSTECTOMY  09/2010  . HEMORRHOID SURGERY    . hysterectomy (other)    . JOINT REPLACEMENT    . pacemaker     most recent generator 12/11 by JA  . TOTAL KNEE ARTHROPLASTY Left     reports that she has never smoked. She has never used smokeless tobacco. She reports that she does not drink alcohol or use drugs. Social History   Socioeconomic History  . Marital status: Married    Spouse name: Not on file  . Number of children: 2  . Years of education: Not on file  . Highest education level: Not on file  Occupational History  . Occupation: retired Medical laboratory scientific officer  . Financial resource strain: Not on file  . Food insecurity:    Worry: Not on file    Inability: Not on file  . Transportation needs:    Medical: Not on file    Non-medical: Not on file  Tobacco Use  . Smoking status: Never Smoker  . Smokeless tobacco: Never Used  Substance and Sexual Activity  . Alcohol use: No  . Drug use: No  . Sexual activity: Never  Lifestyle  . Physical activity:  Days per week: Not on file    Minutes per session: Not on file  . Stress: Not on file  Relationships  . Social connections:    Talks on phone: Not on file    Gets together: Not on file    Attends religious service: Not on file    Active member of club or organization: Not on file    Attends meetings of clubs or organizations: Not on file    Relationship status: Not on file  . Intimate partner violence:    Fear of current or ex partner: Not on file    Emotionally abused: Not on file    Physically abused: Not on file    Forced sexual activity: Not on file  Other Topics Concern  . Not on file  Social History  Narrative   Tobacco use, amount per day now: 0      Past tobacco use, amount per day: 0      How many years did you use tobacco: 0      Alcohol use (drinks per week): 0      Diet:      Do you drink/eat things with caffeine? Yes      Marital status: Married            What year were you married? 1946      Do you live in a house, apartment, assisted living, condo, trailer? Apartment       Is it one or more stories? 1      How many persons live in your home? 2      Do you have any pets in your home? 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 Yes            How often? 3-4 times a week       Do you have a living will? Yes      Do you have a DNR form?            If not, do you want to discuss one?      Do you have signed POA/HPOA forms? Yes              Functional Status Survey:    Family History  Problem Relation Age of Onset  . Stroke Father   . Heart disease Father   . Arthritis Father   . Heart disease Mother   . Arthritis Mother   . Stomach cancer Brother   . Stomach cancer Brother   . Liver cancer Brother   . Breast cancer Sister     Health Maintenance  Topic Date Due  . DEXA SCAN  04/29/1990  . PNA vac Low Risk Adult (2 of 2 - PCV13) 02/17/2010  . INFLUENZA VACCINE  12/12/2017  . TETANUS/TDAP  02/11/2022    Allergies  Allergen Reactions  . Alendronate Other (See Comments)    Can not tolerate  . Celebrex [Celecoxib] Other (See Comments)    Can not tolerate  . Meloxicam Other (See Comments)    Avoid   . Myrbetriq [Mirabegron] Other (See Comments)    Can not tolerate  . Norco [Hydrocodone-Acetaminophen] Other (See Comments)    Dizziness and jerking  . Pradaxa [Dabigatran Etexilate Mesylate] Other (See Comments)    Can not tolerate  . Pradaxa [Dabigatran]   . Toviaz [Fesoterodine Fumarate Er] Other (See Comments)    Can not tolerate  .  Vesicare [Solifenacin] Other (See Comments)    Can not tolerate    Outpatient  Encounter Medications as of 12/02/2017  Medication Sig  . Benfotiamine 150 MG CAPS Take 150 mg by mouth daily.  . Calcium Carb-Cholecalciferol (CALCIUM 600+D3) 600-800 MG-UNIT TABS Take 1 tablet by mouth daily.  . cholecalciferol (VITAMIN D) 1000 UNITS tablet Take 1,000 Units by mouth daily.   [name] Dextromethorphan-Guaifenesin (MUCUS RELIEF DM PO) Take 1 tablet by mouth daily.  [name] donepezil (ARICEPT) 5 MG tablet Take 5 mg by mouth at bedtime.  . furosemide (LASIX) 20 MG tablet Take 1 tablet (20 mg total) by mouth daily as needed. As needed for shortness of breath,worsening edema and abrupt weight gain  . gabapentin (NEURONTIN) 300 MG capsule Take 1 capsule (300 mg total) by mouth 2 (two) times daily.  [name] levothyroxine (SYNTHROID, LEVOTHROID) 50 MCG tablet Take 50 mcg by mouth daily before breakfast.  . loratadine (CLARITIN) 10 MG tablet Take 10 mg by mouth daily.   . meclizine (ANTIVERT) 25 MG tablet Take 1 tablet (25 mg total) by mouth 2 (two) times daily as needed for dizziness.  . memantine (NAMENDA) 10 MG tablet Take 5 mg by mouth 2 (two) times daily.   . Multiple Vitamins-Minerals (PRESERVISION AREDS 2 PO) Take 1 capsule by mouth 2 (two) times daily.  [name] NITROSTAT 0.4 MG SL tablet Place 0.4 mg under the tongue every 5 (five) minutes as needed for chest pain (MAX 3 TABLETS).   [name] omeprazole (PRILOSEC) 20 MG capsule Take 20 mg by mouth 2 (two) times daily.   . pravastatin (PRAVACHOL) 20 MG tablet Take 20 mg by mouth daily.    . Probiotic Product (PROBIOTIC DAILY PO) Take 1 capsule by mouth daily.  Orlie Dakin Sodium (SENNA-S PO) Take 1 tablet by mouth as needed.   . sertraline (ZOLOFT) 25 MG tablet Take 1 tablet (25 mg total) by mouth daily. In addition to 68m for a total of 764m . sertraline (ZOLOFT) 50 MG tablet Take 50 mg by mouth daily. In addition to 2550mor total of 35m5m warfarin (COUMADIN) 5 MG tablet Take as directed by coumadin Watts  . zinc oxide (MEIJER ZINC OXIDE) 20 %  ointment Apply 1 application topically as needed for irritation.   No facility-administered encounter medications on file as of 12/02/2017.     Review of Systems  Constitutional: Negative for appetite change, chills, diaphoresis and fever.  HENT: Positive for hearing loss, postnasal drip and rhinorrhea. Negative for ear pain, mouth sores, sinus pressure, sinus pain, sore throat and trouble swallowing.   Eyes: Positive for visual disturbance.       Wears corrective glasses  Respiratory: Positive for cough. Negative for choking, shortness of breath and wheezing.        Occasional cough with lying down  Cardiovascular: Positive for leg swelling. Negative for chest pain and palpitations.  Gastrointestinal: Negative for abdominal pain, blood in stool, constipation, diarrhea, nausea and vomiting.       Abdominal cramps with explosive bowel movement with soft stool, small amount. 2-3 bowel movement a day  Genitourinary: Positive for frequency and urgency. Negative for dysuria, hematuria and pelvic pain.  Musculoskeletal: Positive for gait problem.       Uses walker for ambulation, unsteady gait, no fall reported  Skin: Negative for rash.  Neurological: Positive for dizziness, tremors and headaches.       Occasional dizziness with position change. Has history of myoclonic jerks. Occasional headache and  tylenol and coffee helps  Hematological: Bruises/bleeds easily.  Psychiatric/Behavioral: Positive for confusion. Negative for behavioral problems, self-injury, sleep disturbance and suicidal ideas.    Vitals:   12/02/17 1322  BP: 114/62  Pulse: 80  Resp: 18  Temp: 97.6 F (36.4 C)  TempSrc: Oral  SpO2: 93%  Weight: 142 lb 6.4 oz (64.6 kg)  Height: _0  (1.549 m)   Body mass index is 26.91 kg/m.   Wt Readings from Last 3 Encounters:  12/02/17 142 lb 6.4 oz (64.6 kg)  09/09/17 136 lb (61.7 kg)  09/06/17 148 lb 3.2 oz (67.2 kg)   Physical Exam  Constitutional: She is oriented to  person, place, and time. No distress.  overweight  HENT:  Head: Normocephalic and atraumatic.  Right Ear: External ear normal.  Left Ear: External ear normal.  Nose: Nose normal.  Mouth/Throat: Oropharynx is clear and moist. No oropharyngeal exudate.  Right ear canal cerumen  Eyes: Pupils are equal, round, and reactive to light. Conjunctivae and EOM are normal. Right eye exhibits no discharge. Left eye exhibits no discharge.  Neck: Normal range of motion. Neck supple.  Cardiovascular: Normal rate and regular rhythm.  Murmur heard. pacemaker  Pulmonary/Chest: Effort normal and breath sounds normal. No respiratory distress. She has no wheezes. She has no rales.  Abdominal: Soft. Bowel sounds are normal. There is no tenderness. There is no guarding.  Musculoskeletal: Normal range of motion. She exhibits edema.  Trace leg edema, can move all 4 extremities, unsteady gait, walker  Lymphadenopathy:    She has no cervical adenopathy.  Neurological: She is alert and oriented to person, place, and time. She exhibits normal muscle tone.  Skin: Skin is warm and dry. She is not diaphoretic.  Psychiatric: She has a normal mood and affect.    Labs reviewed: Basic Metabolic Panel: Recent Labs    03/19/17  NA 145  K 5.9  BUN 23*  CREATININE 0.9  CALCIUM 9.1   Liver Function Tests: Recent Labs    03/19/17  AST 22  ALT 38  ALKPHOS 120  BILITOT 0.3  PROT 6.7  ALBUMIN 3.7   No results for input(s): LIPASE, AMYLASE in the last 8760 hours. No results for input(s): AMMONIA in the last 8760 hours. CBC: Recent Labs    03/19/17  WBC 5.9  HGB 13.1  HCT 41.2  MCV 95.6   Cardiac Enzymes: No results for input(s): CKTOTAL, CKMB, CKMBINDEX, TROPONINI in the last 8760 hours. BNP: Invalid input(s): POCBNP No results found for: HGBA1C Lab Results  Component Value Date   TSH 0.43 02/12/2011   Lab Results  Component Value Date   VITAMINB12 447 08/27/2009   Lab Results  Component Value  Date   FOLATE  08/27/2009    18.8 (NOTE)  Reference Ranges        Deficient:       0.4 - 3.3 ng/mL        Indeterminate:   3.4 - 5.4 ng/mL        Normal:              > 5.4 ng/mL   No results found for: IRON, TIBC, FERRITIN  Lipid Panel: Recent Labs    03/19/17  CHOL 198  LDLCALC 111  TRIG 130   No results found for: HGBA1C  Procedures since last visit: No results found.  Assessment/Plan  1. Paroxysmal atrial fibrillation (HCC) Continue warfarin for anitcoagulation, followed by cardiology and coumadin [facility] - CMP with eGFR([facility]); Future -  CBC (no diff); Future - TSH; Future  2. Rhinitis, chronic Continue loratadine 10 mg daily  3. Acquired hypothyroidism Continue levothyroxine 50 mcg daily - CMP with eGFR([facility]); Future - Lipid Panel; Future - CBC (no diff); Future - TSH; Future  4. Chronic right-sided low back pain with right-sided sciatica Continue gabapentin and tylenol as needed  5. Cognitive impairment Continue donepezil and memantine, supportive care, husband is supportive  6. Hyperlipidemia, unspecified hyperlipidemia type Continue pravastatin - Lipid Panel; Future  7. History of complete heart block S/p pacemaker and has routine interrogation, has yearly f/u with EP  8. Gastroesophageal reflux disease without esophagitis Continue omeprazole 20 mg bid  9. Anxiety disorder, unspecified type Continue sertraline 75 mg daily and monitor  10. Right ear impacted cerumen  - carbamide peroxide (DEBROX) 6.5 % OTIC solution; Put 3 drops to right ear twice a day for 1 week followed by ear lavage  Dispense: 15 mL; Refill: 0     Labs/tests ordered:   Lab Orders     CMP with eGFR([facility])     Lipid Panel     CBC (no diff)     TSH  Next appointment: 3 months  Communication: reviewed care plan with patient and her husband    [name], MD Internal Medicine [facility] [address], [place]  [address] Cell Phone (Monday-Friday 8 am - 5 pm): [phone] On Call: [phone] and follow prompts after 5 pm and on weekends Office Phone: [phone] Office Fax: [phone]

## 2017-12-04 ENCOUNTER — Ambulatory Visit (INDEPENDENT_AMBULATORY_CARE_PROVIDER_SITE_OTHER): Payer: Medicare Other | Admitting: *Deleted

## 2017-12-04 DIAGNOSIS — E785 Hyperlipidemia, unspecified: Secondary | ICD-10-CM | POA: Diagnosis not present

## 2017-12-04 DIAGNOSIS — E039 Hypothyroidism, unspecified: Secondary | ICD-10-CM | POA: Diagnosis not present

## 2017-12-04 DIAGNOSIS — I442 Atrioventricular block, complete: Secondary | ICD-10-CM

## 2017-12-04 DIAGNOSIS — I480 Paroxysmal atrial fibrillation: Secondary | ICD-10-CM | POA: Diagnosis not present

## 2017-12-04 NOTE — Progress Notes (Signed)
Remote pacemaker transmission.   

## 2017-12-05 ENCOUNTER — Ambulatory Visit (INDEPENDENT_AMBULATORY_CARE_PROVIDER_SITE_OTHER): Payer: Medicare Other | Admitting: Interventional Cardiology

## 2017-12-05 DIAGNOSIS — I5042 Chronic combined systolic (congestive) and diastolic (congestive) heart failure: Secondary | ICD-10-CM | POA: Diagnosis not present

## 2017-12-05 DIAGNOSIS — Z5181 Encounter for therapeutic drug level monitoring: Secondary | ICD-10-CM | POA: Diagnosis not present

## 2017-12-05 DIAGNOSIS — I480 Paroxysmal atrial fibrillation: Secondary | ICD-10-CM

## 2017-12-05 DIAGNOSIS — E039 Hypothyroidism, unspecified: Secondary | ICD-10-CM

## 2017-12-05 DIAGNOSIS — E785 Hyperlipidemia, unspecified: Secondary | ICD-10-CM

## 2017-12-05 DIAGNOSIS — Z7901 Long term (current) use of anticoagulants: Secondary | ICD-10-CM | POA: Diagnosis not present

## 2017-12-05 LAB — LIPID PANEL
Cholesterol: 188 mg/dL (ref <200)
HDL: 72 mg/dL (ref >50)
LDL Cholesterol (Calc): 94 mg/dL (calc)
Non-HDL Cholesterol (Calc): 116 mg/dL (calc) (ref <130)
Total CHOL/HDL Ratio: 2.6 (calc) (ref <5.0)
Triglycerides: 128 mg/dL (ref <150)

## 2017-12-05 LAB — COMPLETE METABOLIC PANEL WITH GFR
AG Ratio: 2 (calc) (ref 1.0–2.5)
ALT: 14 U/L (ref 6–29)
AST: 18 U/L (ref 10–35)
Albumin: 4.3 g/dL (ref 3.6–5.1)
Alkaline phosphatase (APISO): 77 U/L (ref 33–130)
BUN: 16 mg/dL (ref 7–25)
CO2: 26 mmol/L (ref 20–32)
Calcium: 9.7 mg/dL (ref 8.6–10.4)
Chloride: 107 mmol/L (ref 98–110)
Creat: 0.76 mg/dL (ref 0.60–0.88)
GFR, Est African American: 79 mL/min/{1.73_m2} (ref >=60)
GFR, Est Non African American: 68 mL/min/{1.73_m2} (ref >=60)
Globulin: 2.1 g/dL (calc) (ref 1.9–3.7)
Glucose, Bld: 99 mg/dL (ref 65–99)
Potassium: 4.3 mmol/L (ref 3.5–5.3)
Sodium: 143 mmol/L (ref 135–146)
Total Bilirubin: 0.4 mg/dL (ref 0.2–1.2)
Total Protein: 6.4 g/dL (ref 6.1–8.1)

## 2017-12-05 LAB — CBC
HCT: 40 % (ref 35.0–45.0)
Hemoglobin: 13.1 g/dL (ref 11.7–15.5)
MCH: 28.9 pg (ref 27.0–33.0)
MCHC: 32.8 g/dL (ref 32.0–36.0)
MCV: 88.1 fL (ref 80.0–100.0)
MPV: 11.8 fL (ref 7.5–12.5)
Platelets: 240 10*3/uL (ref 140–400)
RBC: 4.54 10*6/uL (ref 3.80–5.10)
RDW: 13.8 % (ref 11.0–15.0)
WBC: 6.6 10*3/uL (ref 3.8–10.8)

## 2017-12-05 LAB — POCT INR: INR: 3.2 — AB (ref 2.0–3.0)

## 2017-12-05 LAB — TSH: TSH: 2.55 mIU/L (ref 0.40–4.50)

## 2017-12-05 NOTE — Patient Instructions (Signed)
Description   Spoke with pt's husband and instructed him to have pt take 2.5mg  today then start taking Coumadin 5mg s daily.  Recheck in 2 weeks.  Orders faxed to [facility].

## 2017-12-09 ENCOUNTER — Ambulatory Visit: Payer: Medicare Other | Admitting: *Deleted

## 2017-12-09 DIAGNOSIS — H6121 Impacted cerumen, right ear: Secondary | ICD-10-CM | POA: Diagnosis not present

## 2017-12-09 NOTE — Progress Notes (Signed)
Impaction cleared with lavage. Tolerated well.

## 2017-12-10 ENCOUNTER — Other Ambulatory Visit: Payer: Self-pay | Admitting: *Deleted

## 2017-12-10 DIAGNOSIS — E039 Hypothyroidism, unspecified: Secondary | ICD-10-CM

## 2017-12-10 DIAGNOSIS — E785 Hyperlipidemia, unspecified: Secondary | ICD-10-CM

## 2017-12-11 DIAGNOSIS — H353221 Exudative age-related macular degeneration, left eye, with active choroidal neovascularization: Secondary | ICD-10-CM | POA: Diagnosis not present

## 2017-12-11 DIAGNOSIS — H353111 Nonexudative age-related macular degeneration, right eye, early dry stage: Secondary | ICD-10-CM | POA: Diagnosis not present

## 2017-12-19 ENCOUNTER — Ambulatory Visit (INDEPENDENT_AMBULATORY_CARE_PROVIDER_SITE_OTHER): Payer: Medicare Other | Admitting: Cardiovascular Disease

## 2017-12-19 DIAGNOSIS — I480 Paroxysmal atrial fibrillation: Secondary | ICD-10-CM | POA: Diagnosis not present

## 2017-12-19 DIAGNOSIS — Z5181 Encounter for therapeutic drug level monitoring: Secondary | ICD-10-CM | POA: Diagnosis not present

## 2017-12-19 DIAGNOSIS — I4891 Unspecified atrial fibrillation: Secondary | ICD-10-CM | POA: Diagnosis not present

## 2017-12-19 DIAGNOSIS — Z7901 Long term (current) use of anticoagulants: Secondary | ICD-10-CM | POA: Diagnosis not present

## 2017-12-19 LAB — PROTIME-INR: INR: 2.7 — AB (ref 0.9–1.1)

## 2017-12-25 ENCOUNTER — Encounter: Payer: Self-pay | Admitting: Internal Medicine

## 2018-01-07 ENCOUNTER — Other Ambulatory Visit: Payer: Self-pay

## 2018-01-07 DIAGNOSIS — E039 Hypothyroidism, unspecified: Secondary | ICD-10-CM

## 2018-01-07 DIAGNOSIS — E785 Hyperlipidemia, unspecified: Secondary | ICD-10-CM

## 2018-01-09 DIAGNOSIS — I4891 Unspecified atrial fibrillation: Secondary | ICD-10-CM | POA: Diagnosis not present

## 2018-01-09 DIAGNOSIS — Z7901 Long term (current) use of anticoagulants: Secondary | ICD-10-CM | POA: Diagnosis not present

## 2018-01-09 LAB — PROTIME-INR: INR: 2.5 — AB (ref 0.9–1.1)

## 2018-01-10 ENCOUNTER — Ambulatory Visit (INDEPENDENT_AMBULATORY_CARE_PROVIDER_SITE_OTHER): Payer: Medicare Other

## 2018-01-10 DIAGNOSIS — I480 Paroxysmal atrial fibrillation: Secondary | ICD-10-CM

## 2018-01-10 DIAGNOSIS — Z5181 Encounter for therapeutic drug level monitoring: Secondary | ICD-10-CM

## 2018-01-10 NOTE — Patient Instructions (Signed)
Description   Spoke with pt's husband and instructed him to have pt continue taking Coumadin 5mg s daily.  Recheck in 4 weeks.  Orders faxed to [facility].

## 2018-01-12 LAB — CUP PACEART REMOTE DEVICE CHECK
Battery Impedance: 735 Ohm
Battery Remaining Longevity: 73 mo
Battery Voltage: 2.78 V
Brady Statistic AP VP Percent: 2 %
Brady Statistic AP VS Percent: 0 %
Brady Statistic AS VP Percent: 98 %
Brady Statistic AS VS Percent: 0 %
Date Time Interrogation Session: 20190724124407
Implantable Lead Implant Date: 19980518
Implantable Lead Implant Date: 20040301
Implantable Lead Location: 753859
Implantable Lead Location: 753860
Implantable Lead Model: 4269
Implantable Lead Model: 4285
Implantable Lead Serial Number: 249440
Implantable Lead Serial Number: 295540
Implantable Pulse Generator Implant Date: 20111206
Lead Channel Impedance Value: 612 Ohm
Lead Channel Impedance Value: 869 Ohm
Lead Channel Pacing Threshold Amplitude: 1.375 V
Lead Channel Pacing Threshold Amplitude: 1.5 V
Lead Channel Pacing Threshold Pulse Width: 0.4 ms
Lead Channel Pacing Threshold Pulse Width: 0.4 ms
Lead Channel Setting Pacing Amplitude: 2 V
Lead Channel Setting Pacing Amplitude: 2.5 V
Lead Channel Setting Pacing Pulse Width: 0.4 ms
Lead Channel Setting Sensing Sensitivity: 5.6 mV

## 2018-01-13 ENCOUNTER — Other Ambulatory Visit: Payer: Self-pay

## 2018-01-14 MED ORDER — FUROSEMIDE 20 MG PO TABS: 20.0000 mg | ORAL_TABLET | Freq: Every day | ORAL | 0 refills | Status: DC | PRN

## 2018-01-20 NOTE — Progress Notes (Signed)
Electrophysiology Office Note Date: 01/22/2018  ID:  [name], DOB 04/26/1923, MRN [id number]  PCP: [name], MD Electrophysiologist: Rayann Heman  CC: Pacemaker follow-up  [name] is a 84 y.o. female seen today for Dr Rayann Heman.  She presents today for routine electrophysiology followup.  Since last being seen in our clinic, the patient reports doing very well. She lives at [facility] with her husband of 36 years.  She denies chest pain, palpitations, dyspnea, PND, orthopnea, nausea, vomiting, dizziness, syncope, edema, weight gain, or early satiety.  Device History: MDT dual chamber PPM implanted 1998 for complete heart block; new RV lead and gen change 2004; gen change 2011   Past Medical History:  Diagnosis Date  . Arthritis   . Atrial fibrillation ([place])   . Dementia   . Depression   . DVT (deep venous thrombosis) ([place]) 2011  . HB (heart block)    s/p PPM, most recent generator change 12/11 by JA  . Hiatal hernia   . Hyperlipidemia   . Hypothyroidism   . PTE (post-transplant erythrocytosis)   . Pulmonary embolism ([place]) 2011   on coumadin  . UTI (lower urinary tract infection)    Past Surgical History:  Procedure Laterality Date  . CHOLECYSTECTOMY  09/2010  . HEMORRHOID SURGERY    . hysterectomy (other)    . JOINT REPLACEMENT    . pacemaker     most recent generator 12/11 by JA  . TOTAL KNEE ARTHROPLASTY Left     Current Outpatient Medications  Medication Sig Dispense Refill  . Benfotiamine 150 MG CAPS Take 150 mg by mouth daily.    . Calcium Carb-Cholecalciferol (CALCIUM 600+D3) 600-800 MG-UNIT TABS Take 1 tablet by mouth daily.    . carbamide peroxide (DEBROX) 6.5 % OTIC solution Put 3 drops to right ear twice a day for 1 week followed by ear lavage 15 mL 0  . cholecalciferol (VITAMIN D) 1000 UNITS tablet Take 1,000 Units by mouth daily.     [name] Dextromethorphan-Guaifenesin (MUCUS RELIEF DM PO) Take 1 tablet by mouth daily.    [name]  donepezil (ARICEPT) 5 MG tablet Take 5 mg by mouth at bedtime.    . furosemide (LASIX) 20 MG tablet Take 1 tablet (20 mg total) by mouth daily as needed. As needed for shortness of breath,worsening edema and abrupt weight gain 90 tablet 0  . gabapentin (NEURONTIN) 300 MG capsule Take 1 capsule (300 mg total) by mouth 2 (two) times daily. 180 capsule 3  . levothyroxine (SYNTHROID, LEVOTHROID) 50 MCG tablet Take 50 mcg by mouth daily before breakfast.    . loratadine (CLARITIN) 10 MG tablet Take 10 mg by mouth daily.     . meclizine (ANTIVERT) 25 MG tablet Take 1 tablet (25 mg total) by mouth 2 (two) times daily as needed for dizziness. 30 tablet 1  . memantine (NAMENDA) 10 MG tablet Take 5 mg by mouth 2 (two) times daily.     . Multiple Vitamins-Minerals (PRESERVISION AREDS 2 PO) Take 1 capsule by mouth 2 (two) times daily.    [name] NITROSTAT 0.4 MG SL tablet Place 0.4 mg under the tongue every 5 (five) minutes as needed for chest pain (MAX 3 TABLETS).     [name] omeprazole (PRILOSEC) 20 MG capsule Take 20 mg by mouth 2 (two) times daily.     . pravastatin (PRAVACHOL) 20 MG tablet Take 20 mg by mouth daily.      . Probiotic Product (PROBIOTIC DAILY PO)  Take 1 capsule by mouth daily.    Orlie Dakin Sodium (SENNA-S PO) Take 1 tablet by mouth as needed.     . sertraline (ZOLOFT) 25 MG tablet Take 1 tablet (25 mg total) by mouth daily. In addition to 50mg  for a total of 75mg  90 tablet 3  . sertraline (ZOLOFT) 50 MG tablet Take 50 mg by mouth daily. In addition to 25mg  for total of 75mg     . warfarin (COUMADIN) 5 MG tablet Take as directed by coumadin clinic 120 tablet 1  . zinc oxide (MEIJER ZINC OXIDE) 20 % ointment Apply 1 application topically as needed for irritation. 56.7 g 0   No current facility-administered medications for this visit.     Allergies:   Alendronate; Celebrex [celecoxib]; Meloxicam; Myrbetriq [mirabegron]; Norco [hydrocodone-acetaminophen]; Pradaxa [dabigatran etexilate  mesylate]; Pradaxa [dabigatran]; Toviaz [fesoterodine fumarate er]; and Vesicare [solifenacin]   Social History: Social History   Socioeconomic History  . Marital status: Married    Spouse name: Not on file  . Number of children: 2  . Years of education: Not on file  . Highest education level: Not on file  Occupational History  . Occupation: retired Medical laboratory scientific officer  . Financial resource strain: Not on file  . Food insecurity:    Worry: Not on file    Inability: Not on file  . Transportation needs:    Medical: Not on file    Non-medical: Not on file  Tobacco Use  . Smoking status: Never Smoker  . Smokeless tobacco: Never Used  Substance and Sexual Activity  . Alcohol use: No  . Drug use: No  . Sexual activity: Never  Lifestyle  . Physical activity:    Days per week: Not on file    Minutes per session: Not on file  . Stress: Not on file  Relationships  . Social connections:    Talks on phone: Not on file    Gets together: Not on file    Attends religious service: Not on file    Active member of club or organization: Not on file    Attends meetings of clubs or organizations: Not on file    Relationship status: Not on file  . Intimate partner violence:    Fear of current or ex partner: Not on file    Emotionally abused: Not on file    Physically abused: Not on file    Forced sexual activity: Not on file  Other Topics Concern  . Not on file  Social History Narrative   Tobacco use, amount per day now: 0      Past tobacco use, amount per day: 0      How many years did you use tobacco: 0      Alcohol use (drinks per week): 0      Diet:      Do you drink/eat things with caffeine? Yes      Marital status: Married            What year were you married? 1946      Do you live in a house, apartment, assisted living, condo, trailer? Apartment       Is it one or more stories? 1      How many persons live in your home? 2      Do you have any pets in your  home? 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  Yes            How often? 3-4 times a week       Do you have a living will? Yes      Do you have a DNR form?            If not, do you want to discuss one?      Do you have signed POA/HPOA forms? Yes              Family History: Family History  Problem Relation Age of Onset  . Stroke Father   . Heart disease Father   . Arthritis Father   . Heart disease Mother   . Arthritis Mother   . Stomach cancer Brother   . Stomach cancer Brother   . Liver cancer Brother   . Breast cancer Sister      Review of Systems: All other systems reviewed and are otherwise negative except as noted above.   Physical Exam: VS:  BP 132/60   Pulse 93   Ht 5\' 1"  (1.549 m)   Wt 143 lb 1.9 oz (64.9 kg)   SpO2 97%   BMI 27.04 kg/m  , BMI Body mass index is 27.04 kg/m.  GEN- The patient is elderly appearing, alert and oriented x 3 today.   HEENT: normocephalic, atraumatic; sclera clear, conjunctiva pink; hearing intact; oropharynx clear; neck supple  Lungs- Clear to ausculation bilaterally, normal work of breathing.  No wheezes, rales, rhonchi Heart- Regular rate and rhythm, 2/6 SEM GI- soft, non-tender, non-distended, bowel sounds present  Extremities- no clubbing, cyanosis, or edema  MS- no significant deformity or atrophy Skin- warm and dry, no rash or lesion; PPM pocket well healed Psych- euthymic mood, full affect Neuro- strength and sensation are intact  PPM Interrogation- reviewed in detail today,  See PACEART report  EKG:  EKG is not ordered today.  Recent Labs: 12/04/2017: ALT 14; BUN 16; Creat 0.76; Hemoglobin 13.1; Platelets 240; Potassium 4.3; Sodium 143; TSH 2.55   Wt Readings from Last 3 Encounters:  01/22/18 143 lb 1.9 oz (64.9 kg)  12/02/17 142 lb 6.4 oz (64.6 kg)  09/09/17 136 lb (61.7 kg)     Other studies Reviewed: Additional studies/ records that were reviewed today include: Dr Jackalyn Lombard  office notes   Assessment and Plan:  1.  Complete heart block Normal PPM function See Pace Art report No changes today  2.  Paroxysmal atrial fibrillation/ prior PE Continue Warfarin No bleeding issues Recent CBC reviewed  3.  Aortic stenosis Mild by echo 2016, moderate by exam today  Will plan to follow clinically    Current medicines are reviewed at length with the patient today.   The patient does not have concerns regarding her medicines.  The following changes were made today:  none  Labs/ tests ordered today include: none Orders Placed This Encounter  Procedures  . CUP PACEART INCLINIC DEVICE CHECK     Disposition:   Follow up with [facility], Dr Rayann Heman 1 year     Signed, Chanetta Marshall, NP 01/22/2018 11:34 AM  [facility] [address] [place]  [address] [phone] (office) [phone] (fax)

## 2018-01-21 DIAGNOSIS — H5203 Hypermetropia, bilateral: Secondary | ICD-10-CM | POA: Diagnosis not present

## 2018-01-21 DIAGNOSIS — H353112 Nonexudative age-related macular degeneration, right eye, intermediate dry stage: Secondary | ICD-10-CM | POA: Diagnosis not present

## 2018-01-21 DIAGNOSIS — Z961 Presence of intraocular lens: Secondary | ICD-10-CM | POA: Diagnosis not present

## 2018-01-21 DIAGNOSIS — H353221 Exudative age-related macular degeneration, left eye, with active choroidal neovascularization: Secondary | ICD-10-CM | POA: Diagnosis not present

## 2018-01-22 ENCOUNTER — Encounter: Payer: Self-pay | Admitting: Nurse Practitioner

## 2018-01-22 ENCOUNTER — Ambulatory Visit (INDEPENDENT_AMBULATORY_CARE_PROVIDER_SITE_OTHER): Payer: Medicare Other | Admitting: Nurse Practitioner

## 2018-01-22 ENCOUNTER — Other Ambulatory Visit: Payer: Self-pay | Admitting: *Deleted

## 2018-01-22 VITALS — BP 132/60 | HR 93 | Ht 61.0 in | Wt 143.1 lb

## 2018-01-22 DIAGNOSIS — I480 Paroxysmal atrial fibrillation: Secondary | ICD-10-CM | POA: Diagnosis not present

## 2018-01-22 DIAGNOSIS — I442 Atrioventricular block, complete: Secondary | ICD-10-CM

## 2018-01-22 DIAGNOSIS — I350 Nonrheumatic aortic (valve) stenosis: Secondary | ICD-10-CM | POA: Diagnosis not present

## 2018-01-22 LAB — CUP PACEART INCLINIC DEVICE CHECK
Date Time Interrogation Session: 20190911112940
Implantable Lead Implant Date: 19980518
Implantable Lead Implant Date: 20040301
Implantable Lead Location: 753859
Implantable Lead Location: 753860
Implantable Lead Model: 4269
Implantable Lead Model: 4285
Implantable Lead Serial Number: 249440
Implantable Lead Serial Number: 295540
Implantable Pulse Generator Implant Date: 20111206

## 2018-01-22 MED ORDER — LEVOTHYROXINE SODIUM 50 MCG PO TABS: 50.0000 ug | ORAL_TABLET | Freq: Every day | ORAL | 1 refills | Status: DC

## 2018-01-22 MED ORDER — DONEPEZIL HCL 5 MG PO TABS: 5.0000 mg | ORAL_TABLET | Freq: Every day | ORAL | 1 refills | Status: DC

## 2018-01-22 NOTE — Patient Instructions (Addendum)
Medication Instructions:   Your physician recommends that you continue on your current medications as directed. Please refer to the Current Medication list given to you today.   If you need a refill on your cardiac medications before your next appointment, please call your pharmacy.  Labwork: NONE ORDERED  TODAY    Testing/Procedures: NONE ORDERED  TODAY    Follow-Up: Your physician wants you to follow-up in: [place] will receive a reminder letter in the mail two months in advance. If you don't receive a letter, please call our office to schedule the follow-up appointment.  Remote monitoring is used to monitor your Pacemaker of ICD from home. This monitoring reduces the number of office visits required to check your device to one time per year. It allows [place] to keep an eye on the functioning of your device to ensure it is working properly. You are scheduled for a device check from home on . 03-05-18 You may send your transmission at any time that day. If you have a wireless device, the transmission will be sent automatically. After your physician reviews your transmission, you will receive a postcard with your next transmission date.     Any Other Special Instructions Will Be Listed Below (If Applicable).

## 2018-01-22 NOTE — Telephone Encounter (Signed)
[facility] 

## 2018-01-30 DIAGNOSIS — M25561 Pain in right knee: Secondary | ICD-10-CM | POA: Diagnosis not present

## 2018-01-30 DIAGNOSIS — M7582 Other shoulder lesions, left shoulder: Secondary | ICD-10-CM | POA: Diagnosis not present

## 2018-01-30 DIAGNOSIS — M25512 Pain in left shoulder: Secondary | ICD-10-CM | POA: Diagnosis not present

## 2018-01-30 DIAGNOSIS — M7060 Trochanteric bursitis, unspecified hip: Secondary | ICD-10-CM | POA: Diagnosis not present

## 2018-01-30 DIAGNOSIS — M810 Age-related osteoporosis without current pathological fracture: Secondary | ICD-10-CM | POA: Diagnosis not present

## 2018-01-30 DIAGNOSIS — M545 Low back pain: Secondary | ICD-10-CM | POA: Diagnosis not present

## 2018-01-30 DIAGNOSIS — M1990 Unspecified osteoarthritis, unspecified site: Secondary | ICD-10-CM | POA: Diagnosis not present

## 2018-02-06 ENCOUNTER — Ambulatory Visit (INDEPENDENT_AMBULATORY_CARE_PROVIDER_SITE_OTHER): Payer: Medicare Other | Admitting: Cardiology

## 2018-02-06 DIAGNOSIS — I480 Paroxysmal atrial fibrillation: Secondary | ICD-10-CM | POA: Diagnosis not present

## 2018-02-06 DIAGNOSIS — H353211 Exudative age-related macular degeneration, right eye, with active choroidal neovascularization: Secondary | ICD-10-CM | POA: Diagnosis not present

## 2018-02-06 DIAGNOSIS — H353221 Exudative age-related macular degeneration, left eye, with active choroidal neovascularization: Secondary | ICD-10-CM | POA: Diagnosis not present

## 2018-02-06 DIAGNOSIS — Z5181 Encounter for therapeutic drug level monitoring: Secondary | ICD-10-CM | POA: Diagnosis not present

## 2018-02-06 DIAGNOSIS — I4891 Unspecified atrial fibrillation: Secondary | ICD-10-CM | POA: Diagnosis not present

## 2018-02-06 DIAGNOSIS — Z7901 Long term (current) use of anticoagulants: Secondary | ICD-10-CM | POA: Diagnosis not present

## 2018-02-06 LAB — PROTIME-INR: INR: 2.1 — AB (ref 0.9–1.1)

## 2018-02-07 NOTE — Patient Instructions (Signed)
Description   Spoke with pt's husband and instructed him to have pt continue taking Coumadin 1 tablet daily.  Recheck in 4 weeks. Orders faxed to [facility].      

## 2018-03-04 ENCOUNTER — Encounter: Payer: PRIVATE HEALTH INSURANCE | Admitting: Family

## 2018-03-05 ENCOUNTER — Non-Acute Institutional Stay: Payer: Medicare Other | Admitting: Family Medicine

## 2018-03-05 ENCOUNTER — Encounter: Payer: Self-pay | Admitting: Family Medicine

## 2018-03-05 ENCOUNTER — Ambulatory Visit (INDEPENDENT_AMBULATORY_CARE_PROVIDER_SITE_OTHER): Payer: Medicare Other | Admitting: *Deleted

## 2018-03-05 VITALS — BP 124/80 | HR 77 | Temp 98.1°F | Ht 61.0 in | Wt 140.0 lb

## 2018-03-05 DIAGNOSIS — I442 Atrioventricular block, complete: Secondary | ICD-10-CM | POA: Diagnosis not present

## 2018-03-05 DIAGNOSIS — Z23 Encounter for immunization: Secondary | ICD-10-CM

## 2018-03-05 DIAGNOSIS — R4189 Other symptoms and signs involving cognitive functions and awareness: Secondary | ICD-10-CM | POA: Diagnosis not present

## 2018-03-05 DIAGNOSIS — I480 Paroxysmal atrial fibrillation: Secondary | ICD-10-CM

## 2018-03-05 DIAGNOSIS — H353211 Exudative age-related macular degeneration, right eye, with active choroidal neovascularization: Secondary | ICD-10-CM | POA: Diagnosis not present

## 2018-03-05 DIAGNOSIS — R197 Diarrhea, unspecified: Secondary | ICD-10-CM | POA: Diagnosis not present

## 2018-03-05 MED ORDER — PNEUMOCOCCAL 13-VAL CONJ VACC IM SUSP: 0.5000 mL | Freq: Once | INTRAMUSCULAR | 0 refills | Status: AC

## 2018-03-05 NOTE — Progress Notes (Signed)
Location:  [place] of Service:  Clinic [phone])  Provider: Alain Honey MD  Code Status: FULL Goals of Care:  Advanced Directives 03/05/2018  Does Patient Have a Medical Advance Directive? Yes  Type of Advance Directive [place]  Does patient want to make changes to medical advance directive? No - Patient declined  Copy of [place] in Chart? Yes     Chief Complaint  Patient presents with  . Medical Management of Chronic Issues    3 month follow up   Patient has been has several concerns.  He brings in some labs from [facility] discussed.  First labs are a CMP this shows some mild elevation.  They are worried about a "spot on her liver.  She has not had any scans they assume some liver problem based on reassured them that these labs are not particularly alarming in 2 to 3 months for stab further explained that labs like this are usually the result of medications.  Second issue is diarrhea and loose stools since she started on several medications including Zoloft and Aricept explained that we could trial these medicines observe for improvement in the diarrheal symptoms further explained that unlikely to provide much benefit patient still takes Namenda meds dementia  HPI: Patient is a 84 y.o. female seen today for medical management of chronic diseases.     Past Medical History:  Diagnosis Date  . Arthritis   . Atrial fibrillation ([place])   . Dementia ([place])   . Depression   . DVT (deep venous thrombosis) ([place]) 2011  . HB (heart block)    s/p PPM, most recent generator change 12/11 by JA  . Hiatal hernia   . Hyperlipidemia   . Hypothyroidism   . PTE (post-transplant erythrocytosis)   . Pulmonary embolism ([place]) 2011   on coumadin  . UTI (lower urinary tract infection)     Past Surgical History:  Procedure Laterality Date  . CHOLECYSTECTOMY  09/2010  . HEMORRHOID SURGERY    . hysterectomy (other)      . JOINT REPLACEMENT    . pacemaker     most recent generator 12/11 by JA  . TOTAL KNEE ARTHROPLASTY Left     Allergies  Allergen Reactions  . Alendronate Other (See Comments)    Can not tolerate  . Celebrex [Celecoxib] Other (See Comments)    Can not tolerate  . Meloxicam Other (See Comments)    Avoid   . Myrbetriq [Mirabegron] Other (See Comments)    Can not tolerate  . Norco [Hydrocodone-Acetaminophen] Other (See Comments)    Dizziness and jerking  . Pradaxa [Dabigatran Etexilate Mesylate] Other (See Comments)    Can not tolerate  . Pradaxa [Dabigatran]   . Toviaz [Fesoterodine Fumarate Er] Other (See Comments)    Can not tolerate  . Vesicare [Solifenacin] Other (See Comments)    Can not tolerate    Outpatient Encounter Medications as of 03/05/2018  Medication Sig  . Benfotiamine 150 MG CAPS Take 150 mg by mouth daily.  . Calcium Carb-Cholecalciferol (CALCIUM 600+D3) 600-800 MG-UNIT TABS Take 1 tablet by mouth daily.  . cholecalciferol (VITAMIN D) 1000 UNITS tablet Take 1,000 Units by mouth daily.   [name] Dextromethorphan-Guaifenesin (MUCUS RELIEF DM PO) Take 1 tablet by mouth daily.  [name] donepezil (ARICEPT) 5 MG tablet Take 1 tablet (5 mg total) by mouth at bedtime.  . furosemide (LASIX) 20 MG tablet Take 1 tablet (20 mg  total) by mouth daily as needed. As needed for shortness of breath,worsening edema and abrupt weight gain  . gabapentin (NEURONTIN) 300 MG capsule Take 1 capsule (300 mg total) by mouth 2 (two) times daily.  [name] levothyroxine (SYNTHROID, LEVOTHROID) 50 MCG tablet Take 1 tablet (50 mcg total) by mouth daily before breakfast.  . loratadine (CLARITIN) 10 MG tablet Take 10 mg by mouth daily.   . meclizine (ANTIVERT) 25 MG tablet Take 1 tablet (25 mg total) by mouth 2 (two) times daily as needed for dizziness.  . memantine (NAMENDA) 10 MG tablet Take 5 mg by mouth 2 (two) times daily.   . Multiple Vitamins-Minerals (PRESERVISION AREDS 2 PO) Take 1 capsule by mouth 2  (two) times daily.  [name] NITROSTAT 0.4 MG SL tablet Place 0.4 mg under the tongue every 5 (five) minutes as needed for chest pain (MAX 3 TABLETS).   [name] omeprazole (PRILOSEC) 20 MG capsule Take 20 mg by mouth 2 (two) times daily.   . pravastatin (PRAVACHOL) 20 MG tablet Take 20 mg by mouth daily.    . Probiotic Product (PROBIOTIC DAILY PO) Take 1 capsule by mouth daily.  Orlie Dakin Sodium (SENNA-S PO) Take 1 tablet by mouth as needed.   . sertraline (ZOLOFT) 25 MG tablet Take 1 tablet (25 mg total) by mouth daily. In addition to 50mg  for a total of 75mg   . sertraline (ZOLOFT) 50 MG tablet Take 50 mg by mouth daily. In addition to 25mg  for total of 75mg   . warfarin (COUMADIN) 2.5 MG tablet Take 2.5 mg by mouth daily. On Fridays only  . warfarin (COUMADIN) 5 MG tablet Take 5 mg by mouth See admin instructions. Every day except Friday  . zinc oxide (MEIJER ZINC OXIDE) 20 % ointment Apply 1 application topically as needed for irritation.  . [DISCONTINUED] carbamide peroxide (DEBROX) 6.5 % OTIC solution Put 3 drops to right ear twice a day for 1 week followed by ear lavage  . [DISCONTINUED] warfarin (COUMADIN) 5 MG tablet Take as directed by coumadin clinic   No facility-administered encounter medications on file as of 03/05/2018.     Review of Systems:  Review of Systems  Constitutional: Negative.   HENT: Negative.   Eyes: Negative.   Respiratory: Negative.   Cardiovascular: Negative.   Gastrointestinal: Positive for diarrhea.  Endocrine: Negative.   Genitourinary: Negative.   Musculoskeletal: Positive for gait problem.  Neurological: Positive for dizziness.  Hematological: Negative.   Psychiatric/Behavioral: The patient is nervous/anxious.     Health Maintenance  Topic Date Due  . DEXA SCAN  04/29/1990  . PNA vac Low Risk Adult (2 of 2 - PCV13) 02/17/2010  . INFLUENZA VACCINE  12/12/2017  . TETANUS/TDAP  02/11/2022    Physical Exam: Vitals:   03/05/18 1024  BP: 124/80    Pulse: 77  Temp: 98.1 F (36.7 C)  TempSrc: Oral  SpO2: 98%  Weight: 140 lb (63.5 kg)  Height: 5\' 1"  (1.549 m)   Body mass index is 26.45 kg/m. Physical Exam  Constitutional: She is oriented to person, place, and time. She appears well-developed and well-nourished.  This couple lives in independent living.  Husband seems to be primary caregiver.  Wife has questionable history of dementia  HENT:  Head: Normocephalic.  Left Ear: External ear normal.  Mouth/Throat: Oropharynx is clear and moist.  Eyes: Pupils are equal, round, and reactive to light. EOM are normal.  Cardiovascular: Normal rate, regular rhythm and normal heart sounds.  Pulmonary/Chest: Breath sounds  normal.  Musculoskeletal:  Uses walker to assist in ambulation  Neurological: She is alert and oriented to person, place, and time.  Patient is conversant to state needs.  She does not remember what she ate yesterday but is able to spell world backwards remembers 2 of 3 words at 5 minutes.  Skin: Skin is warm and dry.  Psychiatric: She has a normal mood and affect. Her behavior is normal. Thought content normal.  Nursing note and vitals reviewed.   Labs reviewed: Basic Metabolic Panel: Recent Labs    03/19/17 12/04/17 0000  NA 145 143  K 5.9 4.3  CL  --  107  CO2  --  26  GLUCOSE  --  99  BUN 23* 16  CREATININE 0.9 0.76  CALCIUM 9.1 9.7  TSH  --  2.55   Liver Function Tests: Recent Labs    03/19/17 12/04/17 0000  AST 22 18  ALT 38 14  ALKPHOS 120  --   BILITOT 0.3 0.4  PROT 6.7 6.4  ALBUMIN 3.7  --    No results for input(s): LIPASE, AMYLASE in the last 8760 hours. No results for input(s): AMMONIA in the last 8760 hours. CBC: Recent Labs    03/19/17 12/04/17 0000  WBC 5.9 6.6  HGB 13.1 13.1  HCT 41.2 40.0  MCV 95.6 88.1  PLT  --  240   Lipid Panel: Recent Labs    03/19/17 12/04/17 0000  CHOL 198 188  HDL  --  72  LDLCALC 111 94  TRIG 130 128  CHOLHDL  --  2.6   No results found  for: HGBA1C  Procedures since last visit: No results found.  Assessment/Plan 1. Need for prophylactic vaccination with Streptococcus pneumoniae (Pneumococcus) and Influenza vaccines  - Pneumococcal conjugate vaccine 13-valent IM  2. Diarrhea in adult patient Patient has had extensive stool testing for infectious etiology of diarrhea.  I suspect drawling Zoloft and Aricept difference in her diarrhea.  We will gradually do this and recheck in 1 month  3. ATRIOVENTRICULAR BLOCK, 3RD DEGREE Patient has a pacemaker and this is followed  4. Paroxysmal atrial fibrillation (HCC) Patient is on no rate controlling drugs her heart would seem to be in sinus rhythm with normal rate this morning  5. Cognitive impairment This is mild at this point.  Can safely withdrawal Aricept regards diarrhea observe   Labs/tests ordered:  @ORDERS @ Next appt:  03/13/2018  Lillette Boxer. Sabra Heck, [place] [address] [place], [place] Office [phone]

## 2018-03-05 NOTE — Progress Notes (Signed)
Remote pacemaker transmission.   

## 2018-03-06 ENCOUNTER — Ambulatory Visit (INDEPENDENT_AMBULATORY_CARE_PROVIDER_SITE_OTHER): Payer: Medicare Other | Admitting: Cardiovascular Disease

## 2018-03-06 ENCOUNTER — Encounter: Payer: Self-pay | Admitting: Cardiology

## 2018-03-06 DIAGNOSIS — Z5181 Encounter for therapeutic drug level monitoring: Secondary | ICD-10-CM

## 2018-03-06 DIAGNOSIS — I480 Paroxysmal atrial fibrillation: Secondary | ICD-10-CM

## 2018-03-06 DIAGNOSIS — Z7901 Long term (current) use of anticoagulants: Secondary | ICD-10-CM | POA: Diagnosis not present

## 2018-03-06 DIAGNOSIS — I4891 Unspecified atrial fibrillation: Secondary | ICD-10-CM | POA: Diagnosis not present

## 2018-03-06 LAB — PROTIME-INR: INR: 3.5 — AB (ref 0.9–1.1)

## 2018-03-12 DIAGNOSIS — E785 Hyperlipidemia, unspecified: Secondary | ICD-10-CM | POA: Diagnosis not present

## 2018-03-12 DIAGNOSIS — E039 Hypothyroidism, unspecified: Secondary | ICD-10-CM | POA: Diagnosis not present

## 2018-03-13 ENCOUNTER — Other Ambulatory Visit: Payer: Medicare Other

## 2018-03-13 DIAGNOSIS — E785 Hyperlipidemia, unspecified: Secondary | ICD-10-CM

## 2018-03-13 DIAGNOSIS — E039 Hypothyroidism, unspecified: Secondary | ICD-10-CM

## 2018-03-13 LAB — LIPID PANEL
Cholesterol: 175 mg/dL (ref <200)
HDL: 55 mg/dL (ref >50)
LDL Cholesterol (Calc): 93 mg/dL (calc)
Non-HDL Cholesterol (Calc): 120 mg/dL (calc) (ref <130)
Total CHOL/HDL Ratio: 3.2 (calc) (ref <5.0)
Triglycerides: 169 mg/dL — ABNORMAL HIGH (ref <150)

## 2018-03-13 LAB — TSH: TSH: 3.37 mIU/L (ref 0.40–4.50)

## 2018-03-14 ENCOUNTER — Other Ambulatory Visit: Payer: Self-pay

## 2018-03-14 DIAGNOSIS — E785 Hyperlipidemia, unspecified: Secondary | ICD-10-CM

## 2018-03-14 DIAGNOSIS — E039 Hypothyroidism, unspecified: Secondary | ICD-10-CM

## 2018-03-20 ENCOUNTER — Ambulatory Visit (INDEPENDENT_AMBULATORY_CARE_PROVIDER_SITE_OTHER): Payer: Medicare Other

## 2018-03-20 DIAGNOSIS — Z5181 Encounter for therapeutic drug level monitoring: Secondary | ICD-10-CM | POA: Diagnosis not present

## 2018-03-20 DIAGNOSIS — I480 Paroxysmal atrial fibrillation: Secondary | ICD-10-CM

## 2018-03-20 DIAGNOSIS — Z7901 Long term (current) use of anticoagulants: Secondary | ICD-10-CM | POA: Diagnosis not present

## 2018-03-20 DIAGNOSIS — I4891 Unspecified atrial fibrillation: Secondary | ICD-10-CM | POA: Diagnosis not present

## 2018-03-20 LAB — PROTIME-INR: INR: 2.3 — AB (ref 0.9–1.1)

## 2018-03-20 NOTE — Patient Instructions (Signed)
Description   Spoke with pt's husband and instructed him to have pt continue taking Coumadin 1 tablet daily.  Recheck in 2 weeks.  Orders faxed to [facility].

## 2018-03-22 DIAGNOSIS — Z23 Encounter for immunization: Secondary | ICD-10-CM | POA: Diagnosis not present

## 2018-03-25 ENCOUNTER — Other Ambulatory Visit: Payer: Self-pay | Admitting: *Deleted

## 2018-03-25 MED ORDER — GABAPENTIN 300 MG PO CAPS: 300.0000 mg | ORAL_CAPSULE | Freq: Two times a day (BID) | ORAL | 3 refills | Status: DC

## 2018-03-25 NOTE — Telephone Encounter (Signed)
[facility] 

## 2018-03-26 ENCOUNTER — Other Ambulatory Visit: Payer: Self-pay | Admitting: *Deleted

## 2018-03-26 MED ORDER — SERTRALINE HCL 50 MG PO TABS: 50.0000 mg | ORAL_TABLET | Freq: Every day | ORAL | 1 refills | Status: DC

## 2018-03-26 NOTE — Telephone Encounter (Signed)
[facility] 

## 2018-03-27 LAB — CUP PACEART REMOTE DEVICE CHECK
Battery Impedance: 837 Ohm
Battery Remaining Longevity: 68 mo
Battery Voltage: 2.78 V
Brady Statistic AP VP Percent: 2 %
Brady Statistic AP VS Percent: 0 %
Brady Statistic AS VP Percent: 98 %
Brady Statistic AS VS Percent: 0 %
Date Time Interrogation Session: 20191023122133
Implantable Lead Implant Date: 19980518
Implantable Lead Implant Date: 20040301
Implantable Lead Location: 753859
Implantable Lead Location: 753860
Implantable Lead Model: 4269
Implantable Lead Model: 4285
Implantable Lead Serial Number: 249440
Implantable Lead Serial Number: 295540
Implantable Pulse Generator Implant Date: 20111206
Lead Channel Impedance Value: 612 Ohm
Lead Channel Impedance Value: 887 Ohm
Lead Channel Pacing Threshold Amplitude: 1.5 V
Lead Channel Pacing Threshold Amplitude: 1.5 V
Lead Channel Pacing Threshold Pulse Width: 0.4 ms
Lead Channel Pacing Threshold Pulse Width: 0.4 ms
Lead Channel Setting Pacing Amplitude: 2 V
Lead Channel Setting Pacing Amplitude: 2.5 V
Lead Channel Setting Pacing Pulse Width: 0.4 ms
Lead Channel Setting Sensing Sensitivity: 5.6 mV

## 2018-04-01 DIAGNOSIS — H353221 Exudative age-related macular degeneration, left eye, with active choroidal neovascularization: Secondary | ICD-10-CM | POA: Diagnosis not present

## 2018-04-01 DIAGNOSIS — H353211 Exudative age-related macular degeneration, right eye, with active choroidal neovascularization: Secondary | ICD-10-CM | POA: Diagnosis not present

## 2018-04-02 ENCOUNTER — Encounter: Payer: Self-pay | Admitting: Family Medicine

## 2018-04-02 ENCOUNTER — Non-Acute Institutional Stay: Payer: Medicare Other | Admitting: Family Medicine

## 2018-04-02 VITALS — BP 100/62 | HR 101 | Temp 97.6°F | Ht 61.0 in | Wt 141.4 lb

## 2018-04-02 DIAGNOSIS — R4189 Other symptoms and signs involving cognitive functions and awareness: Secondary | ICD-10-CM | POA: Diagnosis not present

## 2018-04-02 DIAGNOSIS — I480 Paroxysmal atrial fibrillation: Secondary | ICD-10-CM | POA: Diagnosis not present

## 2018-04-02 MED ORDER — SERTRALINE HCL 25 MG PO TABS: 25.0000 mg | ORAL_TABLET | Freq: Two times a day (BID) | ORAL | 4 refills | Status: DC

## 2018-04-02 NOTE — Progress Notes (Signed)
Location: [place] of Service:  Clinic [phone])  Provider: Dr. Alain Honey   Code Status: FULL Goals of Care:  Advanced Directives 04/02/2018  Does Patient Have a Medical Advance Directive? Yes  Type of Advance Directive [place]  Does patient want to make changes to medical advance directive? No - Patient declined  Copy of [place] in Chart? Yes - validated most recent copy scanned in chart (See row information)     Chief Complaint  Patient presents with  . Acute Visit    Uncontrolled shaking, medication review    HPI: Patient is a 84 y.o. female seen today for an acute visit for follow-up occasions side effects.  At her last visit she complained about diarrhea and reviewing her medicines noted that she was taking Zoloft as well as Aricept.  I suggested that we stop both of these medicines to see effect.  Her history suggests Zoloft was being used for some myoclonic activity.  Husband says that it has gotten worse off the Zoloft so he restarted it himself.  She is still off Aricept  Past Medical History:  Diagnosis Date  . Arthritis   . Atrial fibrillation ([place])   . Dementia ([place])   . Depression   . DVT (deep venous thrombosis) ([place]) 2011  . HB (heart block)    s/p PPM, most recent generator change 12/11 by JA  . Hiatal hernia   . Hyperlipidemia   . Hypothyroidism   . PTE (post-transplant erythrocytosis)   . Pulmonary embolism ([place]) 2011   on coumadin  . UTI (lower urinary tract infection)     Past Surgical History:  Procedure Laterality Date  . CHOLECYSTECTOMY  09/2010  . HEMORRHOID SURGERY    . hysterectomy (other)    . JOINT REPLACEMENT    . pacemaker     most recent generator 12/11 by JA  . TOTAL KNEE ARTHROPLASTY Left     Allergies  Allergen Reactions  . Alendronate Other (See Comments)    Can not tolerate  . Celebrex [Celecoxib] Other (See Comments)    Can not tolerate  . Meloxicam Other (See  Comments)    Avoid   . Myrbetriq [Mirabegron] Other (See Comments)    Can not tolerate  . Norco [Hydrocodone-Acetaminophen] Other (See Comments)    Dizziness and jerking  . Pradaxa [Dabigatran Etexilate Mesylate] Other (See Comments)    Can not tolerate  . Pradaxa [Dabigatran]   . Toviaz [Fesoterodine Fumarate Er] Other (See Comments)    Can not tolerate  . Vesicare [Solifenacin] Other (See Comments)    Can not tolerate    Outpatient Encounter Medications as of 04/02/2018  Medication Sig  . Benfotiamine 150 MG CAPS Take 150 mg by mouth daily.  . Calcium Carb-Cholecalciferol (CALCIUM 600+D3) 600-800 MG-UNIT TABS Take 1 tablet by mouth daily.  . cholecalciferol (VITAMIN D) 1000 UNITS tablet Take 1,000 Units by mouth daily.   [name] Dextromethorphan-Guaifenesin (MUCUS RELIEF DM PO) Take 1 tablet by mouth daily.  [name] donepezil (ARICEPT) 5 MG tablet Take 1 tablet (5 mg total) by mouth at bedtime.  . furosemide (LASIX) 20 MG tablet Take 1 tablet (20 mg total) by mouth daily as needed. As needed for shortness of breath,worsening edema and abrupt weight gain  . gabapentin (NEURONTIN) 300 MG capsule Take 1 capsule (300 mg total) by mouth 2 (two) times daily.  [name] levothyroxine (SYNTHROID, LEVOTHROID) 50 MCG tablet Take 1 tablet (50 mcg  total) by mouth daily before breakfast.  . loratadine (CLARITIN) 10 MG tablet Take 10 mg by mouth daily.   . meclizine (ANTIVERT) 25 MG tablet Take 1 tablet (25 mg total) by mouth 2 (two) times daily as needed for dizziness.  . memantine (NAMENDA) 10 MG tablet Take 5 mg by mouth 2 (two) times daily.   . Multiple Vitamins-Minerals (PRESERVISION AREDS 2 PO) Take 1 capsule by mouth 2 (two) times daily.  [name] NITROSTAT 0.4 MG SL tablet Place 0.4 mg under the tongue every 5 (five) minutes as needed for chest pain (MAX 3 TABLETS).   [name] omeprazole (PRILOSEC) 20 MG capsule Take 20 mg by mouth 2 (two) times daily.   . pravastatin (PRAVACHOL) 20 MG tablet Take 20 mg by mouth daily.      . Probiotic Product (PROBIOTIC DAILY PO) Take 1 capsule by mouth daily.  Orlie Dakin Sodium (SENNA-S PO) Take 1 tablet by mouth as needed.   . warfarin (COUMADIN) 2.5 MG tablet Take 2.5 mg by mouth daily. On Fridays only  . warfarin (COUMADIN) 5 MG tablet Take 5 mg by mouth See admin instructions. Every day except Friday  . zinc oxide (MEIJER ZINC OXIDE) 20 % ointment Apply 1 application topically as needed for irritation.  . sertraline (ZOLOFT) 25 MG tablet Take 1 tablet (25 mg total) by mouth daily. In addition to 50mg  for a total of 75mg  (Patient not taking: Reported on 04/02/2018)  . sertraline (ZOLOFT) 50 MG tablet Take 1 tablet (50 mg total) by mouth daily. In addition to 25mg  for total of 75mg  (Patient not taking: Reported on 04/02/2018)   No facility-administered encounter medications on file as of 04/02/2018.     Review of Systems:  Review of Systems  Constitutional: Negative.   Gastrointestinal: Positive for diarrhea.  Neurological: Positive for tremors.    Health Maintenance  Topic Date Due  . DEXA SCAN  04/29/1990  . TETANUS/TDAP  02/11/2022  . INFLUENZA VACCINE  Completed  . PNA vac Low Risk Adult  Completed    Physical Exam: Vitals:   04/02/18 0827  BP: 100/61  Pulse: (!) 101  Temp: 97.6 F (36.4 C)  TempSrc: Oral  SpO2: 100%  Weight: 142 lb 9.6 oz (64.7 kg)  Height: 5\' 1"  (1.549 m)   Body mass index is 26.94 kg/m. Physical Exam  Constitutional: She is oriented to person, place, and time. She appears well-developed and well-nourished.  Patient's movements during exam she had few scattered jerking movements but nothing extraordinary  HENT:  Head: Normocephalic.  Cardiovascular: Normal rate and regular rhythm.  Pulmonary/Chest: Effort normal and breath sounds normal.  Neurological: She is alert and oriented to person, place, and time.  Psychiatric: She has a normal mood and affect. Her behavior is normal.  Nursing note and vitals  reviewed.   Labs reviewed: Basic Metabolic Panel: Recent Labs    12/04/17 0000 03/12/18 0000  NA 143  --   K 4.3  --   CL 107  --   CO2 26  --   GLUCOSE 99  --   BUN 16  --   CREATININE 0.76  --   CALCIUM 9.7  --   TSH 2.55 3.37   Liver Function Tests: Recent Labs    12/04/17 0000  AST 18  ALT 14  BILITOT 0.4  PROT 6.4   No results for input(s): LIPASE, AMYLASE in the last 8760 hours. No results for input(s): AMMONIA in the last 8760 hours. CBC: Recent  Labs    12/04/17 0000  WBC 6.6  HGB 13.1  HCT 40.0  MCV 88.1  PLT 240   Lipid Panel: Recent Labs    12/04/17 0000 03/12/18 0000  CHOL 188 175  HDL 72 55  LDLCALC 94 93  TRIG 128 169*  CHOLHDL 2.6 3.2   No results found for: HGBA1C  Procedures since last visit: No results found.  Assessment/Plan 1. Paroxysmal atrial fibrillation (HCC) Again, heart rhythm is regular  2. Cognitive impairment This is mild Aricept husband has noticed no change off medicine.  Diarrhea has improved. Continue Zoloft 25 mg at bedtime times q. the morning   Labs/tests ordered:   Next appt:  04/09/2018 Lillette Boxer. Sabra Heck, [place] [address] [place], [place] Office [phone]

## 2018-04-02 NOTE — Patient Instructions (Signed)
Take Fibro cream for Neuropathy.  Citrucel to regulate bowels.  Return in 3 months.

## 2018-04-03 DIAGNOSIS — I4891 Unspecified atrial fibrillation: Secondary | ICD-10-CM | POA: Diagnosis not present

## 2018-04-03 DIAGNOSIS — Z7901 Long term (current) use of anticoagulants: Secondary | ICD-10-CM | POA: Diagnosis not present

## 2018-04-03 LAB — PROTIME-INR: INR: 2.8 — AB (ref 0.9–1.1)

## 2018-04-04 ENCOUNTER — Ambulatory Visit (INDEPENDENT_AMBULATORY_CARE_PROVIDER_SITE_OTHER): Payer: Medicare Other | Admitting: *Deleted

## 2018-04-04 ENCOUNTER — Ambulatory Visit (INDEPENDENT_AMBULATORY_CARE_PROVIDER_SITE_OTHER): Payer: Medicare Other | Admitting: Cardiovascular Disease

## 2018-04-04 ENCOUNTER — Other Ambulatory Visit: Payer: Self-pay

## 2018-04-04 ENCOUNTER — Telehealth: Payer: Self-pay | Admitting: Cardiology

## 2018-04-04 DIAGNOSIS — I442 Atrioventricular block, complete: Secondary | ICD-10-CM

## 2018-04-04 DIAGNOSIS — I480 Paroxysmal atrial fibrillation: Secondary | ICD-10-CM

## 2018-04-04 DIAGNOSIS — Z5181 Encounter for therapeutic drug level monitoring: Secondary | ICD-10-CM

## 2018-04-04 NOTE — Telephone Encounter (Signed)
Called patient due to alert received for ERI. Pt husband (DPR on file) stated that when patient was last seen she had several years last on her battery. After looking at the report I noticed that in 01/2018 patient had about 6 years remaining on her battery life. I informed pt husband that I would get Device Tech RN to look into this further and call him back. Pt husband verbalized understanding.

## 2018-04-04 NOTE — Telephone Encounter (Signed)
Spoke to husband. Appt scheduled for 11/22 @ 1530.  Johnney Killian, MDT aware.

## 2018-04-04 NOTE — Telephone Encounter (Addendum)
[name] called requesting to speak with Dr. Sabra Heck. I informed him, Dr. Sabra Heck was off and would not be back until Tuesday at  [facility]. [name] verbalized understanding and stated he would like to talk to Dr. Sabra Heck, Tuesday when he returns.   [name] went on to say that pharmacy could not refill his wife's medication without direction clarification from provider.   Pt is concerned because she used to be on 50mg  qd and now she's on 25 bid, please clarify which dose pt should be on and how many times daily

## 2018-04-04 NOTE — Telephone Encounter (Signed)
Called and asked husband, Bobby Rumpf, if patient could come into the office for an appt this afternoon. Husband states that he will call their daughter to see if she can bring them. Husband to call back after he calls daughter. Direct number given.

## 2018-04-04 NOTE — Patient Instructions (Signed)
Description   Spoke with pt's husband and instructed him to have pt continue taking Coumadin 1 tablet daily.  Recheck in 4 weeks.  Orders faxed to [facility]

## 2018-04-04 NOTE — Telephone Encounter (Signed)
Patient's device is currently RRT (as of today), last check on 01/22/18, stated that patient had 6 years remaining on device.   Johnney Killian, MDT notified and plans to reach out to tech svcs. Patient may need an appt d/t ?device reset.  Called and spoke to patient's husband, Bobby Rumpf, regarding device and patient's sx's. [name] states that patient has been having "jerky spells" this morning. Spouse said that the "jerky spells" are not unusual for patient. Spouse states that he gave patient an extra sertraline, so she's been sleeping most of the day. Patient has no other c/o per spouse.   Will call patient/spouse with recommendations after tech svcs reviews transmission. Spouse verbalized understanding.

## 2018-04-07 NOTE — Progress Notes (Signed)
Device check in clinic by industry d/t false ERI/RRT.  Interrogation with MDT programmer cleared RRT. Lead measurements are stable. Battery has 5.5 years remaining. Programming prior to ERI has returned (DDDR 60/130). See scanned report for details.  Follow up: [facility] on 11/25, ROV as scheduled.

## 2018-04-08 NOTE — Telephone Encounter (Signed)
Dr. Sabra Heck clarified dosage of Zoloft. Patient should be taking zoloft 50 mg tab po qd. Updated patient's med list and have faxed pharmacy the new instructions.

## 2018-04-09 ENCOUNTER — Encounter: Payer: Self-pay | Admitting: Family Medicine

## 2018-04-14 ENCOUNTER — Other Ambulatory Visit: Payer: Self-pay | Admitting: *Deleted

## 2018-04-14 MED ORDER — WARFARIN SODIUM 5 MG PO TABS: ORAL_TABLET | ORAL | 1 refills | Status: DC

## 2018-04-16 DIAGNOSIS — M810 Age-related osteoporosis without current pathological fracture: Secondary | ICD-10-CM | POA: Diagnosis not present

## 2018-04-29 DIAGNOSIS — H353211 Exudative age-related macular degeneration, right eye, with active choroidal neovascularization: Secondary | ICD-10-CM | POA: Diagnosis not present

## 2018-04-29 DIAGNOSIS — H353221 Exudative age-related macular degeneration, left eye, with active choroidal neovascularization: Secondary | ICD-10-CM | POA: Diagnosis not present

## 2018-05-01 DIAGNOSIS — I4891 Unspecified atrial fibrillation: Secondary | ICD-10-CM | POA: Diagnosis not present

## 2018-05-01 LAB — PROTIME-INR: INR: 2.6 — AB (ref 0.9–1.1)

## 2018-05-02 ENCOUNTER — Ambulatory Visit (INDEPENDENT_AMBULATORY_CARE_PROVIDER_SITE_OTHER): Payer: Medicare Other

## 2018-05-02 DIAGNOSIS — I480 Paroxysmal atrial fibrillation: Secondary | ICD-10-CM

## 2018-05-02 DIAGNOSIS — Z5181 Encounter for therapeutic drug level monitoring: Secondary | ICD-10-CM | POA: Diagnosis not present

## 2018-05-02 NOTE — Patient Instructions (Signed)
Description   Spoke with pt's husband and instructed him to have pt continue taking Coumadin 1 tablet daily.  Recheck in 4 weeks.  Orders faxed to [facility].

## 2018-05-12 DIAGNOSIS — M6281 Muscle weakness (generalized): Secondary | ICD-10-CM | POA: Diagnosis not present

## 2018-05-12 DIAGNOSIS — M545 Low back pain: Secondary | ICD-10-CM | POA: Diagnosis not present

## 2018-05-21 ENCOUNTER — Ambulatory Visit (INDEPENDENT_AMBULATORY_CARE_PROVIDER_SITE_OTHER): Payer: Medicare Other | Admitting: Specialist

## 2018-05-26 DIAGNOSIS — H35453 Secondary pigmentary degeneration, bilateral: Secondary | ICD-10-CM | POA: Diagnosis not present

## 2018-05-26 DIAGNOSIS — H35363 Drusen (degenerative) of macula, bilateral: Secondary | ICD-10-CM | POA: Diagnosis not present

## 2018-05-26 DIAGNOSIS — H353221 Exudative age-related macular degeneration, left eye, with active choroidal neovascularization: Secondary | ICD-10-CM | POA: Diagnosis not present

## 2018-05-26 DIAGNOSIS — H35371 Puckering of macula, right eye: Secondary | ICD-10-CM | POA: Diagnosis not present

## 2018-05-26 DIAGNOSIS — H353211 Exudative age-related macular degeneration, right eye, with active choroidal neovascularization: Secondary | ICD-10-CM | POA: Diagnosis not present

## 2018-05-26 DIAGNOSIS — Z961 Presence of intraocular lens: Secondary | ICD-10-CM | POA: Diagnosis not present

## 2018-05-26 DIAGNOSIS — H3122 Choroidal dystrophy (central areolar) (generalized) (peripapillary): Secondary | ICD-10-CM | POA: Diagnosis not present

## 2018-05-29 ENCOUNTER — Ambulatory Visit (INDEPENDENT_AMBULATORY_CARE_PROVIDER_SITE_OTHER): Payer: Medicare Other

## 2018-05-29 DIAGNOSIS — Z5181 Encounter for therapeutic drug level monitoring: Secondary | ICD-10-CM

## 2018-05-29 DIAGNOSIS — I4891 Unspecified atrial fibrillation: Secondary | ICD-10-CM | POA: Diagnosis not present

## 2018-05-29 DIAGNOSIS — I480 Paroxysmal atrial fibrillation: Secondary | ICD-10-CM

## 2018-05-29 LAB — PROTIME-INR: INR: 3 — AB (ref 0.9–1.1)

## 2018-05-29 NOTE — Patient Instructions (Signed)
Description   Spoke with pt's husband and instructed him to have pt continue taking Coumadin 1 tablet daily.  Recheck in 4 weeks.  Orders faxed to [facility].

## 2018-05-30 ENCOUNTER — Other Ambulatory Visit: Payer: Self-pay | Admitting: Internal Medicine

## 2018-05-30 DIAGNOSIS — E785 Hyperlipidemia, unspecified: Secondary | ICD-10-CM

## 2018-05-30 DIAGNOSIS — E039 Hypothyroidism, unspecified: Secondary | ICD-10-CM

## 2018-06-02 DIAGNOSIS — M25512 Pain in left shoulder: Secondary | ICD-10-CM | POA: Diagnosis not present

## 2018-06-02 DIAGNOSIS — M7060 Trochanteric bursitis, unspecified hip: Secondary | ICD-10-CM | POA: Diagnosis not present

## 2018-06-02 DIAGNOSIS — M7582 Other shoulder lesions, left shoulder: Secondary | ICD-10-CM | POA: Diagnosis not present

## 2018-06-02 DIAGNOSIS — M810 Age-related osteoporosis without current pathological fracture: Secondary | ICD-10-CM | POA: Diagnosis not present

## 2018-06-02 DIAGNOSIS — M25561 Pain in right knee: Secondary | ICD-10-CM | POA: Diagnosis not present

## 2018-06-02 DIAGNOSIS — M545 Low back pain: Secondary | ICD-10-CM | POA: Diagnosis not present

## 2018-06-02 DIAGNOSIS — M1990 Unspecified osteoarthritis, unspecified site: Secondary | ICD-10-CM | POA: Diagnosis not present

## 2018-06-04 ENCOUNTER — Ambulatory Visit (INDEPENDENT_AMBULATORY_CARE_PROVIDER_SITE_OTHER): Payer: Medicare Other

## 2018-06-04 DIAGNOSIS — I4891 Unspecified atrial fibrillation: Secondary | ICD-10-CM

## 2018-06-04 DIAGNOSIS — I442 Atrioventricular block, complete: Secondary | ICD-10-CM

## 2018-06-05 LAB — CUP PACEART REMOTE DEVICE CHECK
Battery Impedance: 884 Ohm
Battery Remaining Longevity: 66 mo
Battery Voltage: 2.78 V
Brady Statistic AP VP Percent: 1 %
Brady Statistic AP VS Percent: 0 %
Brady Statistic AS VP Percent: 99 %
Brady Statistic AS VS Percent: 0 %
Date Time Interrogation Session: 20200122164314
Implantable Lead Implant Date: 19980518
Implantable Lead Implant Date: 20040301
Implantable Lead Location: 753859
Implantable Lead Location: 753860
Implantable Lead Model: 4269
Implantable Lead Model: 4285
Implantable Lead Serial Number: 249440
Implantable Lead Serial Number: 295540
Implantable Pulse Generator Implant Date: 20111206
Lead Channel Impedance Value: 622 Ohm
Lead Channel Impedance Value: 912 Ohm
Lead Channel Pacing Threshold Amplitude: 1.625 V
Lead Channel Pacing Threshold Pulse Width: 0.4 ms
Lead Channel Setting Pacing Amplitude: 2 V
Lead Channel Setting Pacing Amplitude: 2.5 V
Lead Channel Setting Pacing Pulse Width: 0.4 ms
Lead Channel Setting Sensing Sensitivity: 5.6 mV

## 2018-06-05 NOTE — Progress Notes (Signed)
Remote pacemaker transmission.   

## 2018-06-06 ENCOUNTER — Encounter: Payer: Self-pay | Admitting: Cardiology

## 2018-06-11 ENCOUNTER — Encounter: Payer: Self-pay | Admitting: Internal Medicine

## 2018-06-11 ENCOUNTER — Non-Acute Institutional Stay: Payer: Medicare Other | Admitting: Internal Medicine

## 2018-06-11 VITALS — BP 126/70 | HR 83 | Temp 98.4°F | Ht 61.0 in | Wt 136.8 lb

## 2018-06-11 DIAGNOSIS — R109 Unspecified abdominal pain: Secondary | ICD-10-CM

## 2018-06-11 DIAGNOSIS — R259 Unspecified abnormal involuntary movements: Secondary | ICD-10-CM

## 2018-06-11 DIAGNOSIS — I480 Paroxysmal atrial fibrillation: Secondary | ICD-10-CM

## 2018-06-11 DIAGNOSIS — R4189 Other symptoms and signs involving cognitive functions and awareness: Secondary | ICD-10-CM | POA: Diagnosis not present

## 2018-06-11 DIAGNOSIS — I442 Atrioventricular block, complete: Secondary | ICD-10-CM

## 2018-06-11 DIAGNOSIS — R197 Diarrhea, unspecified: Secondary | ICD-10-CM | POA: Diagnosis not present

## 2018-06-11 DIAGNOSIS — E039 Hypothyroidism, unspecified: Secondary | ICD-10-CM

## 2018-06-11 NOTE — Progress Notes (Addendum)
Location: [name] of Service:  Clinic (12)  Provider:   Code Status:  Goals of Care:  Advanced Directives 06/11/2018  Does Patient Have a Medical Advance Directive? Yes  Type of Advance Directive [place]  Does patient want to make changes to medical advance directive? No - Patient declined  Copy of [place] in Chart? Yes - validated most recent copy scanned in chart (See row information)     Chief Complaint  Patient presents with  . Acute Visit    Diarrhea    HPI: Patient is a 85 y.o. female seen today for an acute visit for ? Diarrhea, Abdominal Pain and Medication management Patient has h/o Chronic Atrial Fibrillation on Coumadin, S/P PPM for Heart Block, Hypothyroidism, Mild Cognitive disorder, Myoclonic Jerking Diagnosed in [facility] and follows with Neurology, Hypothyroidism, Anxiety, Depression  Patient lives with her Husband in independent Apartments. Per Dr Doy Mince Neurologist at [facility] note from 07/19 She has h/o Myoclonic Jerks. He has tried different regimes. And now has her on Zoloft and Neurontin.He does not think she has NeuroDegenerative disorder and think it is functional. Per her Husband her symptoms are controlled on Zoloft and Neurontin. But now both think that these meds are giving her diarrhea. Per husband they have tried to stop Aricept and it did not help and then when they decreased the dose of Zoloft she started having Jerks again and they had to go up on the dose.  She says that she goes to the bathroom and has bowel movements sometimes 8-9 times a day. She has had accidents. No Blood. Sh eodes c/o Lower abdominal Pain with her Bowel movement Her weight has been stable and her Appetite seems to be good. Patient lives with her husband and had has mild Cognitive impairment. She walks with the walker. Is independent in her ADL. Husband takes care of her medicines and Finances   Past Medical History:    Diagnosis Date  . Arthritis   . Atrial fibrillation ([place])   . Dementia ([place])   . Depression   . DVT (deep venous thrombosis) ([place]) 2011  . HB (heart block)    s/p PPM, most recent generator change 12/11 by JA  . Hiatal hernia   . Hyperlipidemia   . Hypothyroidism   . PTE (post-transplant erythrocytosis)   . Pulmonary embolism ([place]) 2011   on coumadin  . UTI (lower urinary tract infection)     Past Surgical History:  Procedure Laterality Date  . CHOLECYSTECTOMY  09/2010  . HEMORRHOID SURGERY    . hysterectomy (other)    . JOINT REPLACEMENT    . pacemaker     most recent generator 12/11 by JA  . TOTAL KNEE ARTHROPLASTY Left     Allergies  Allergen Reactions  . Alendronate Other (See Comments)    Can not tolerate  . Celebrex [Celecoxib] Other (See Comments)    Can not tolerate  . Meloxicam Other (See Comments)    Avoid   . Myrbetriq [Mirabegron] Other (See Comments)    Can not tolerate  . Norco [Hydrocodone-Acetaminophen] Other (See Comments)    Dizziness and jerking  . Pradaxa [Dabigatran Etexilate Mesylate] Other (See Comments)    Can not tolerate  . Pradaxa [Dabigatran]   . Toviaz [Fesoterodine Fumarate Er] Other (See Comments)    Can not tolerate  . Vesicare [Solifenacin] Other (See Comments)    Can not tolerate    Outpatient Encounter Medications  as of 06/11/2018  Medication Sig  . Benfotiamine 150 MG CAPS Take 150 mg by mouth daily.  . Calcium Carb-Cholecalciferol (CALCIUM 600+D3) 600-800 MG-UNIT TABS Take 1 tablet by mouth daily.  . cholecalciferol (VITAMIN D) 1000 UNITS tablet Take 1,000 Units by mouth daily.   [name] Dextromethorphan-Guaifenesin (MUCUS RELIEF DM PO) Take 1 tablet by mouth daily.  [name] donepezil (ARICEPT) 5 MG tablet Take 1 tablet (5 mg total) by mouth at bedtime.  . furosemide (LASIX) 20 MG tablet Take 1 tablet (20 mg total) by mouth daily as needed. As needed for shortness of breath,worsening edema and abrupt weight gain  . gabapentin  (NEURONTIN) 300 MG capsule Take 1 capsule (300 mg total) by mouth 2 (two) times daily.  [name] levothyroxine (SYNTHROID, LEVOTHROID) 50 MCG tablet Take 1 tablet (50 mcg total) by mouth daily before breakfast.  . loratadine (CLARITIN) 10 MG tablet Take 10 mg by mouth daily.   . meclizine (ANTIVERT) 25 MG tablet Take 1 tablet (25 mg total) by mouth 2 (two) times daily as needed for dizziness.  . Multiple Vitamins-Minerals (PRESERVISION AREDS 2 PO) Take 1 capsule by mouth 2 (two) times daily.  [name] NITROSTAT 0.4 MG SL tablet Place 0.4 mg under the tongue every 5 (five) minutes as needed for chest pain (MAX 3 TABLETS).   [name] omeprazole (PRILOSEC) 20 MG capsule Take 20 mg by mouth 2 (two) times daily.   . pravastatin (PRAVACHOL) 20 MG tablet Take 20 mg by mouth daily.    . Probiotic Product (PROBIOTIC DAILY PO) Take 1 capsule by mouth daily.  . sertraline (ZOLOFT) 50 MG tablet Take 50 mg by mouth daily.  [name] warfarin (COUMADIN) 2.5 MG tablet Take 2.5 mg by mouth daily. On Fridays only  . warfarin (COUMADIN) 5 MG tablet Take 1 tablet daily or as directed by the Coumadin Clinic.  [name] zinc oxide (MEIJER ZINC OXIDE) 20 % ointment Apply 1 application topically as needed for irritation.  . memantine (NAMENDA) 10 MG tablet Take 5 mg by mouth 2 (two) times daily.   Orlie Dakin Sodium (SENNA-S PO) Take 1 tablet by mouth as needed.    No facility-administered encounter medications on file as of 06/11/2018.     Review of Systems:  Review of Systems  Constitutional: Negative.   HENT: Negative.   Respiratory: Negative.   Cardiovascular: Positive for leg swelling.  Gastrointestinal: Positive for abdominal pain and diarrhea.  Genitourinary: Negative.   Musculoskeletal: Negative.   Skin: Negative.   Neurological: Negative.   Psychiatric/Behavioral: The patient is nervous/anxious.     Health Maintenance  Topic Date Due  . DEXA SCAN  04/29/1990  . TETANUS/TDAP  02/11/2022  . INFLUENZA VACCINE  Completed  .  PNA vac Low Risk Adult  Completed    Physical Exam: Vitals:   06/11/18 1408  BP: 126/70  Pulse: 83  Temp: 98.4 F (36.9 C)  TempSrc: Oral  SpO2: 95%  Weight: 136 lb 12.8 oz (62.1 kg)  Height: 5\' 1"  (1.549 m)   Body mass index is 25.85 kg/m. Physical Exam Constitutional:      Appearance: Normal appearance.  HENT:     Nose: Nose normal.     Mouth/Throat:     Mouth: Mucous membranes are moist.     Pharynx: Oropharynx is clear.  Eyes:     Pupils: Pupils are equal, round, and reactive to light.  Neck:     Musculoskeletal: Neck supple.  Cardiovascular:     Rate and Rhythm: Normal  rate and regular rhythm.     Heart sounds: Murmur present.  Pulmonary:     Effort: Pulmonary effort is normal. No respiratory distress.     Breath sounds: Normal breath sounds. No wheezing or rales.  Abdominal:     General: Abdomen is flat. Bowel sounds are normal. There is no distension.     Palpations: Abdomen is soft.     Tenderness: There is no abdominal tenderness. There is no guarding.  Musculoskeletal:     Comments: Mild swelling with Chronic Venous changes  Skin:    General: Skin is warm and dry.  Neurological:     General: No focal deficit present.     Mental Status: She is alert and oriented to person, place, and time.  Psychiatric:        Mood and Affect: Mood normal.        Thought Content: Thought content normal.        Judgment: Judgment normal.     Labs reviewed: Basic Metabolic Panel: Recent Labs    12/04/17 0000 03/12/18 0000  NA 143  --   K 4.3  --   CL 107  --   CO2 26  --   GLUCOSE 99  --   BUN 16  --   CREATININE 0.76  --   CALCIUM 9.7  --   TSH 2.55 3.37   Liver Function Tests: Recent Labs    12/04/17 0000  AST 18  ALT 14  BILITOT 0.4  PROT 6.4   No results for input(s): LIPASE, AMYLASE in the last 8760 hours. No results for input(s): AMMONIA in the last 8760 hours. CBC: Recent Labs    12/04/17 0000  WBC 6.6  HGB 13.1  HCT 40.0  MCV 88.1    PLT 240   Lipid Panel: Recent Labs    12/04/17 0000 03/12/18 0000  CHOL 188 175  HDL 72 55  LDLCALC 94 93  TRIG 128 169*  CHOLHDL 2.6 3.2   No results found for: HGBA1C  Procedures since last visit: No results found.  Assessment/Plan 1. Diarrhea in adult patient Patient had GI pathogen screen in 07/19 and it was negative. Patient and his Husband are very reluctant to use lomotil PRN They do not want to stop Aricept as they say it didn't help They have finally agreed to see GAstro - Ambulatory referral to Gastroenterology  2. Abdominal pain, unspecified abdominal location Pain mostly related to Bowel Movements - Ambulatory referral to Gastroenterology  Paroxysmal atrial fibrillation  On Coumadin Follows with Coumadin Clinic  ATRIOVENTRICULAR BLOCK, 3RD DEGREE S/P PPM Stable  Acquired hypothyroidism Last TSH in 10/19 Same dose  Involuntary movements/ Myoclonic Jerks Follows with Neurology in [place] On Neurontin and Zoloft Symptoms mostly controlled  Cognitive impairment On Aricept and Namenda Continue Supportive care      Labs/tests ordered:  Next appt:  06/16/2018 Total time spent in this patient care encounter was 60_ minutes; greater than 50% of the visit spent counseling patient, reviewing records , Labs and coordinating care for problems addressed at this encounter.

## 2018-06-16 ENCOUNTER — Other Ambulatory Visit: Payer: Self-pay

## 2018-06-16 ENCOUNTER — Other Ambulatory Visit: Payer: Medicare Other

## 2018-06-16 DIAGNOSIS — R109 Unspecified abdominal pain: Secondary | ICD-10-CM

## 2018-06-16 LAB — COMPLETE METABOLIC PANEL WITH GFR
AG Ratio: 1.8 (calc) (ref 1.0–2.5)
ALT: 10 U/L (ref 6–29)
AST: 15 U/L (ref 10–35)
Albumin: 4 g/dL (ref 3.6–5.1)
Alkaline phosphatase (APISO): 74 U/L (ref 37–153)
BUN: 18 mg/dL (ref 7–25)
CO2: 29 mmol/L (ref 20–32)
Calcium: 9.7 mg/dL (ref 8.6–10.4)
Chloride: 108 mmol/L (ref 98–110)
Creat: 0.73 mg/dL (ref 0.60–0.88)
GFR, Est African American: 82 mL/min/{1.73_m2} (ref >=60)
GFR, Est Non African American: 71 mL/min/{1.73_m2} (ref >=60)
Globulin: 2.2 g/dL (calc) (ref 1.9–3.7)
Glucose, Bld: 99 mg/dL (ref 65–99)
Potassium: 4.4 mmol/L (ref 3.5–5.3)
Sodium: 146 mmol/L (ref 135–146)
Total Bilirubin: 0.3 mg/dL (ref 0.2–1.2)
Total Protein: 6.2 g/dL (ref 6.1–8.1)

## 2018-06-16 LAB — CBC WITH DIFFERENTIAL/PLATELET
Absolute Monocytes: 496 cells/uL (ref 200–950)
Basophils Absolute: 80 cells/uL (ref 0–200)
Basophils Relative: 1.2 %
Eosinophils Absolute: 67 cells/uL (ref 15–500)
Eosinophils Relative: 1 %
HCT: 38.5 % (ref 35.0–45.0)
Hemoglobin: 12.6 g/dL (ref 11.7–15.5)
Lymphs Abs: 2090 cells/uL (ref 850–3900)
MCH: 28.7 pg (ref 27.0–33.0)
MCHC: 32.7 g/dL (ref 32.0–36.0)
MCV: 87.7 fL (ref 80.0–100.0)
MPV: 12.5 fL (ref 7.5–12.5)
Monocytes Relative: 7.4 %
Neutro Abs: 3966 cells/uL (ref 1500–7800)
Neutrophils Relative %: 59.2 %
Platelets: 232 10*3/uL (ref 140–400)
RBC: 4.39 10*6/uL (ref 3.80–5.10)
RDW: 13.9 % (ref 11.0–15.0)
Total Lymphocyte: 31.2 %
WBC: 6.7 10*3/uL (ref 3.8–10.8)

## 2018-06-16 LAB — LIPASE: Lipase: 36 U/L (ref 7–60)

## 2018-06-19 ENCOUNTER — Ambulatory Visit: Payer: Medicare Other | Admitting: Nurse Practitioner

## 2018-06-25 ENCOUNTER — Encounter: Payer: Medicare Other | Admitting: Internal Medicine

## 2018-06-26 ENCOUNTER — Other Ambulatory Visit: Payer: Self-pay | Admitting: *Deleted

## 2018-06-26 DIAGNOSIS — I4891 Unspecified atrial fibrillation: Secondary | ICD-10-CM | POA: Diagnosis not present

## 2018-06-26 LAB — POCT INR: INR: 2.9 (ref 2.0–3.0)

## 2018-06-26 MED ORDER — PRAVASTATIN SODIUM 20 MG PO TABS: 20.0000 mg | ORAL_TABLET | Freq: Every day | ORAL | 0 refills | Status: DC

## 2018-06-26 NOTE — Telephone Encounter (Signed)
[facility] 

## 2018-06-27 ENCOUNTER — Ambulatory Visit (INDEPENDENT_AMBULATORY_CARE_PROVIDER_SITE_OTHER): Payer: Medicare Other | Admitting: Internal Medicine

## 2018-06-27 DIAGNOSIS — I480 Paroxysmal atrial fibrillation: Secondary | ICD-10-CM

## 2018-06-27 DIAGNOSIS — Z5181 Encounter for therapeutic drug level monitoring: Secondary | ICD-10-CM | POA: Diagnosis not present

## 2018-07-02 ENCOUNTER — Encounter: Payer: Self-pay | Admitting: Family Medicine

## 2018-07-02 DIAGNOSIS — H353211 Exudative age-related macular degeneration, right eye, with active choroidal neovascularization: Secondary | ICD-10-CM | POA: Diagnosis not present

## 2018-07-03 ENCOUNTER — Ambulatory Visit: Payer: Medicare Other | Admitting: Nurse Practitioner

## 2018-07-24 ENCOUNTER — Ambulatory Visit (INDEPENDENT_AMBULATORY_CARE_PROVIDER_SITE_OTHER): Payer: Medicare Other | Admitting: Interventional Cardiology

## 2018-07-24 DIAGNOSIS — Z7901 Long term (current) use of anticoagulants: Secondary | ICD-10-CM | POA: Diagnosis not present

## 2018-07-24 DIAGNOSIS — I5030 Unspecified diastolic (congestive) heart failure: Secondary | ICD-10-CM | POA: Diagnosis not present

## 2018-07-24 DIAGNOSIS — Z5181 Encounter for therapeutic drug level monitoring: Secondary | ICD-10-CM

## 2018-07-24 DIAGNOSIS — I480 Paroxysmal atrial fibrillation: Secondary | ICD-10-CM | POA: Diagnosis not present

## 2018-07-24 DIAGNOSIS — I10 Essential (primary) hypertension: Secondary | ICD-10-CM | POA: Diagnosis not present

## 2018-07-24 LAB — PROTIME-INR: INR: 2.4 — AB (ref 0.9–1.1)

## 2018-07-24 NOTE — Patient Instructions (Signed)
Description   Spoke with pt's husband and instructed him to have pt continue taking Coumadin 1 tablet daily.  Recheck in 4 weeks.  Orders faxed to [facility].

## 2018-07-25 ENCOUNTER — Encounter: Payer: Self-pay | Admitting: Internal Medicine

## 2018-07-25 ENCOUNTER — Other Ambulatory Visit (INDEPENDENT_AMBULATORY_CARE_PROVIDER_SITE_OTHER): Payer: Medicare Other

## 2018-07-25 ENCOUNTER — Other Ambulatory Visit: Payer: Self-pay

## 2018-07-25 ENCOUNTER — Ambulatory Visit (INDEPENDENT_AMBULATORY_CARE_PROVIDER_SITE_OTHER): Payer: Medicare Other | Admitting: Internal Medicine

## 2018-07-25 VITALS — BP 118/60 | HR 85 | Temp 99.3°F | Ht 60.0 in | Wt 134.5 lb

## 2018-07-25 DIAGNOSIS — R194 Change in bowel habit: Secondary | ICD-10-CM

## 2018-07-25 DIAGNOSIS — R152 Fecal urgency: Secondary | ICD-10-CM

## 2018-07-25 DIAGNOSIS — Z7901 Long term (current) use of anticoagulants: Secondary | ICD-10-CM

## 2018-07-25 DIAGNOSIS — R10814 Left lower quadrant abdominal tenderness: Secondary | ICD-10-CM

## 2018-07-25 DIAGNOSIS — R159 Full incontinence of feces: Secondary | ICD-10-CM

## 2018-07-25 DIAGNOSIS — R634 Abnormal weight loss: Secondary | ICD-10-CM | POA: Diagnosis not present

## 2018-07-25 LAB — BUN: BUN: 20 mg/dL (ref 6–23)

## 2018-07-25 LAB — CREATININE, SERUM: Creatinine, Ser: 0.85 mg/dL (ref 0.40–1.20)

## 2018-07-25 NOTE — Patient Instructions (Addendum)
  You have been scheduled for a CT scan of the abdomen and pelvis at [facility] ([address]---this is in the same building as Press [facility]).   You are scheduled on 08/05/2018 at 4:00pm. You should arrive 15 minutes prior to your appointment time for registration. Please follow the written instructions below on the day of your exam:  WARNING: IF YOU ARE ALLERGIC TO IODINE/X-RAY DYE, PLEASE NOTIFY RADIOLOGY IMMEDIATELY AT [phone]! YOU WILL BE GIVEN A 13 HOUR PREMEDICATION PREP.  1) Do not eat or drink anything after 12:00pm (4 hours prior to your test) 2) You have been given 2 bottles of oral contrast to drink. The solution may taste better if refrigerated, but do NOT add ice or any other liquid to this solution. Shake well before drinking.    Drink 1 bottle of contrast @ 2:00pm (2 hours prior to your exam)  Drink 1 bottle of contrast @ 3:00pm (1 hour prior to your exam)  You may take any medications as prescribed with a small amount of water, if necessary. If you take any of the following medications: METFORMIN, GLUCOPHAGE, GLUCOVANCE, AVANDAMET, RIOMET, FORTAMET, [place] MET, JANUMET, GLUMETZA or METAGLIP, you MAY be asked to HOLD this medication 48 hours AFTER the exam.  The purpose of you drinking the oral contrast is to aid in the visualization of your intestinal tract. The contrast solution may cause some diarrhea. Depending on your individual set of symptoms, you may also receive an intravenous injection of x-ray contrast/dye. Plan on being at [facility] for 30 minutes or longer, depending on the type of exam you are having performed.  This test typically takes 30-45 minutes to complete.  If you have any questions regarding your exam or if you need to reschedule, you may call the CT department at [phone] between the hours of 8:00 am and 5:00 pm, Monday-Friday.  ________________________________________________________________________Your provider has  requested that you go to the basement level for lab work before leaving today. Press "B" on the elevator. The lab is located at the first door on the left as you exit the elevator.  I appreciate the opportunity to care for you. Silvano Rusk, MD, [facility]

## 2018-07-25 NOTE — Progress Notes (Addendum)
[name] 85 y.o. [birth year] [id number]  Assessment & Plan:   Encounter Diagnoses  Name Primary?  . Change in bowel habits Yes  . Left lower quadrant abdominal tenderness without rebound tenderness   . Incontinence of feces with fecal urgency   . Loss of weight   . Warfarin anticoagulation     Worst concern in a patient in this age range for the symptoms and signs would be malignancy.  She could have colon cancer though I would expect an anemia or perhaps some bleeding.  Does not have to be present.  A high impaction or severe diverticular disease could also potentially cause similar problems as could some type of inflammatory condition.  I have seen Aricept do similar things but they say she has been on that for a number of years so I guess that is less likely and it looks like it was held at one point without benefit as best I can tell from chart review.  Certainly she has pelvic floor weakness with her age and urinary incontinence symptoms which probably have something to do with her incontinence issues.  I do not think they have tried Imodium or anything like that.   We discussed possible sigmoidoscopy or colonoscopy.  That is not without issues given her age, and her warfarin anticoagulation which would need to be held though it could be.  The patient and her family are willing to have a colonoscopy but we decided to pursue a CT scan first just to see what light that noninvasive study would shed on anything given her overall situation.  Further plans pending that.  I appreciate the opportunity to care for this patient. CC: [name], MD  Subjective:   Chief Complaint: Diarrhea, loose stools  HPI The patient is a very nice married 85 year old white woman who lives at [place], here with her husband and daughter.  Her problem is diarrhea or loose stools that are frequent sometimes urgent and associated with incontinence.  Stools are smaller in caliber.   Never normal size according to her husband though sometimes rather than being pencil thin they might be explosive.  This is been going on for about a year.  Stool pathogen study last year was negative for infection.  She has been on Aricept for a number of years, her gerontologist considered that that might be causing it but the patient feels like it helps her memory and did not want to stop.  Approximately 8 pound weight loss as described.  Appetite okay.  She is frail and elderly and ambulates with a walker.  She has lower abdominal discomfort when defecating.  There is no bleeding and her CBC was normal last month.  So was her CMET Last colonoscopy 2007 mild diverticulosis.  Previously had a history of chronic constipation. She takes warfarin for atrial fibrillation and also has a history of DVT and PE.  Mild dementia.  Looks like Aricept was held last year and probably did not make a difference, there is been some questions whether or not the Zoloft was contributing as well per her husband.  Wt Readings from Last 3 Encounters:  07/25/18 134 lb 8 oz (61 kg)  06/11/18 136 lb 12.8 oz (62.1 kg)  04/02/18 141 lb 6.4 oz (64.1 kg)   TSH NL last year x 2 3.37 Allergies  Allergen Reactions  . Alendronate Other (See Comments)    Can not tolerate  . Celebrex [Celecoxib] Other (See Comments)  Can not tolerate  . Meloxicam Other (See Comments)    Avoid   . Myrbetriq [Mirabegron] Other (See Comments)    Can not tolerate  . Norco [Hydrocodone-Acetaminophen] Other (See Comments)    Dizziness and jerking  . Pradaxa [Dabigatran Etexilate Mesylate] Other (See Comments)    Can not tolerate  . Pradaxa [Dabigatran]   . Toviaz [Fesoterodine Fumarate Er] Other (See Comments)    Can not tolerate  . Vesicare [Solifenacin] Other (See Comments)    Can not tolerate   Current Meds  Medication Sig  . Benfotiamine 150 MG CAPS Take 150 mg by mouth daily.  . Calcium Carb-Cholecalciferol (CALCIUM 600+D3)  600-800 MG-UNIT TABS Take 1 tablet by mouth daily.  . cholecalciferol (VITAMIN D) 1000 UNITS tablet Take 1,000 Units by mouth daily.   [name] Dextromethorphan-Guaifenesin (MUCUS RELIEF DM PO) Take 1 tablet by mouth daily.  [name] donepezil (ARICEPT) 5 MG tablet Take 1 tablet (5 mg total) by mouth at bedtime.  . gabapentin (NEURONTIN) 300 MG capsule Take 1 capsule (300 mg total) by mouth 2 (two) times daily.  [name] levothyroxine (SYNTHROID, LEVOTHROID) 50 MCG tablet Take 1 tablet (50 mcg total) by mouth daily before breakfast.  . loratadine (CLARITIN) 10 MG tablet Take 10 mg by mouth daily.   . memantine (NAMENDA) 10 MG tablet Take 5 mg by mouth 2 (two) times daily.   . Multiple Vitamins-Minerals (PRESERVISION AREDS 2 PO) Take 1 capsule by mouth 2 (two) times daily.  [name] NITROSTAT 0.4 MG SL tablet Place 0.4 mg under the tongue every 5 (five) minutes as needed for chest pain (MAX 3 TABLETS).   [name] omeprazole (PRILOSEC) 20 MG capsule Take 20 mg by mouth 2 (two) times daily.   . pravastatin (PRAVACHOL) 20 MG tablet Take 1 tablet (20 mg total) by mouth daily.  . Probiotic Product (PROBIOTIC DAILY PO) Take 1 capsule by mouth daily.  Orlie Dakin Sodium (SENNA-S PO) Take 1 tablet by mouth as needed.   . sertraline (ZOLOFT) 25 MG tablet Take 75 mg by mouth daily. 25 mg in the mornings and 50 mg in the evenings  . warfarin (COUMADIN) 5 MG tablet Take 1 tablet daily or as directed by the [facility] Clinic.   Past Medical History:  Diagnosis Date  . Arthritis   . Atrial fibrillation ([place])   . Dementia ([place])   . Depression   . DVT (deep venous thrombosis) ([place]) 2011  . HB (heart block)    s/p PPM, most recent generator change 12/11 by JA  . Hiatal hernia   . Hyperlipidemia   . Hypothyroidism   . PTE (post-transplant erythrocytosis)   . Pulmonary embolism ([place]) 2011   on coumadin  . UTI (lower urinary tract infection)    Past Surgical History:  Procedure Laterality Date  . CHOLECYSTECTOMY  09/2010  .  COLONOSCOPY    . ESOPHAGOGASTRODUODENOSCOPY    . HEMORRHOID SURGERY    . hysterectomy (other)    . JOINT REPLACEMENT    . pacemaker     most recent generator 12/11 by JA  . TOTAL KNEE ARTHROPLASTY Left    Social History   Social History Narrative   Tobacco use, amount per day now: 0      Past tobacco use, amount per day: 0      How many years did you use tobacco: 0      Alcohol use (drinks per week): 0      Diet:      Do  you drink/eat things with caffeine? Yes      Marital status: Married            What year were you married? 1946      Do you live in a house, apartment, assisted living, condo, trailer? Apartment       Is it one or more stories? 1      How many persons live in your home? 2      Do you have any pets in your home? 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 Yes            How often? 3-4 times a week       Do you have a living will? Yes      Do you have a DNR form?            If not, do you want to discuss one?      Do you have signed POA/HPOA forms? Yes             family history includes Arthritis in her father and mother; Breast cancer in her sister; Heart disease in her father and mother; Liver cancer in her brother; Stomach cancer in her brother and brother; Stroke in her father.   Review of Systems As per HPI see HPI Chronic non-stress urinary incontinence Ambulates with a walker Joint aches, hard of hearing uses hearing aids  Objective:   Physical Exam @BP  118/60 (BP Location: Left Arm, Patient Position: Sitting, Cuff Size: Normal)   Pulse 85   Temp 99.3 F (37.4 C)   Ht 5' (1.524 m)   Wt 134 lb 8 oz (61 kg)   BMI 26.27 kg/m @  General:  Elderly and moderately frail Eyes:  anicteric. ENT:   Mouth and posterior pharynx free of lesions.  She is hard of hearing Neck:   supple w/o thyromegaly or mass.  Lungs: Clear to auscultation bilaterally. Kyphotic Heart:   S1S2, + harsh 3/6 SEM Abdomen:  soft,  non-tender, no hepatosplenomegaly, hernia, or mass and BS+.  Rectal:  Robin stallings CMA present  NL anoderm, capacious rectum w/ NL tone, soft-loose brown stool and no mass Lymph:  no cervical or supraclavicular adenopathy. Extremities:   no edema, cyanosis or clubbing Skin   no rash.  Psych:  appropriate mood and  Affect.   Data Reviewed: See HPI, have reviewed recent gerontology notes

## 2018-07-30 ENCOUNTER — Telehealth: Payer: Self-pay

## 2018-07-30 DIAGNOSIS — R10814 Left lower quadrant abdominal tenderness: Secondary | ICD-10-CM

## 2018-07-30 NOTE — Telephone Encounter (Signed)
CT is requesting to reschedule the patients upcoming CT on 08/05/18 until after 08/22/18.  Please review for urgency.

## 2018-07-31 NOTE — Telephone Encounter (Signed)
Ok to wait

## 2018-08-04 ENCOUNTER — Other Ambulatory Visit: Payer: Self-pay | Admitting: *Deleted

## 2018-08-04 MED ORDER — DONEPEZIL HCL 5 MG PO TABS: 5.0000 mg | ORAL_TABLET | Freq: Every day | ORAL | 0 refills | Status: DC

## 2018-08-05 ENCOUNTER — Inpatient Hospital Stay: Admission: RE | Admit: 2018-08-05 | Payer: Medicare Other | Source: Ambulatory Visit

## 2018-08-20 ENCOUNTER — Other Ambulatory Visit: Payer: Self-pay | Admitting: *Deleted

## 2018-08-20 MED ORDER — SERTRALINE HCL 25 MG PO TABS: 75.0000 mg | ORAL_TABLET | Freq: Every day | ORAL | 1 refills | Status: DC

## 2018-08-20 NOTE — Telephone Encounter (Signed)
Patient husband requested refill.  To be sent to [facility]

## 2018-08-21 ENCOUNTER — Ambulatory Visit (INDEPENDENT_AMBULATORY_CARE_PROVIDER_SITE_OTHER): Payer: Medicare Other | Admitting: Cardiology

## 2018-08-21 DIAGNOSIS — I4891 Unspecified atrial fibrillation: Secondary | ICD-10-CM | POA: Diagnosis not present

## 2018-08-21 DIAGNOSIS — I480 Paroxysmal atrial fibrillation: Secondary | ICD-10-CM | POA: Diagnosis not present

## 2018-08-21 DIAGNOSIS — Z5181 Encounter for therapeutic drug level monitoring: Secondary | ICD-10-CM | POA: Diagnosis not present

## 2018-08-21 LAB — PROTIME-INR: INR: 2.3 — AB (ref 0.9–1.1)

## 2018-08-21 NOTE — Patient Instructions (Signed)
Description   Spoke with pt's husband and instructed him to have pt continue taking Coumadin 1 tablet daily.  Recheck in 6 weeks.  Orders faxed to [facility].

## 2018-08-26 ENCOUNTER — Other Ambulatory Visit: Payer: Self-pay | Admitting: Internal Medicine

## 2018-08-29 ENCOUNTER — Other Ambulatory Visit: Payer: Self-pay

## 2018-08-29 ENCOUNTER — Ambulatory Visit (INDEPENDENT_AMBULATORY_CARE_PROVIDER_SITE_OTHER): Payer: Medicare Other | Admitting: Adult Health

## 2018-08-29 ENCOUNTER — Encounter: Payer: Self-pay | Admitting: Adult Health

## 2018-08-29 DIAGNOSIS — R300 Dysuria: Secondary | ICD-10-CM

## 2018-08-29 NOTE — Progress Notes (Signed)
This service is provided via telemedicine  No vital signs collected/recorded due to the encounter was a telemedicine visit.   Location of patient (ex: home, work):  [place] [address]   Patient consents to a telephone visit:  Yes   Location of the provider (ex: office, home): Office   Name of any referring provider:  Dr. Veleta Miners   Names of all persons participating in the telemedicine service and their role in the encounter:  Ruthell Rummage CMA, Monina Medina-Vargas NP, Kemper Durie, and Husband   Time spent on call:  Ruthell Rummage CMA spent 10 Minutes with patient on phone.      DATE:  08/29/2018 MRN:  [id number]  BIRTHDAY: 1922-07-31   Contact Information    Name Relation Home Work [place] Spouse [phone]     [name] Daughter [phone]            Chief Complaint  Patient presents with  . Acute Visit    Dysuria     HISTORY OF PRESENT ILLNESS: This is a 85 year old female who lives at [facility] who is calling in for a televisit. She said that she has been having painful urination for a few days. She denies aving hematuria. She knows that she doesn't drink enough water. She drinks 2 glasses of water and 2 cups of coffee daily. She denies having fever. Husband was also on the phone during the televisit.   PAST MEDICAL HISTORY:  Past Medical History:  Diagnosis Date  . Arthritis   . Atrial fibrillation ([place])   . Dementia ([place])   . Depression   . DVT (deep venous thrombosis) ([place]) 2011  . HB (heart block)    s/p PPM, most recent generator change 12/11 by JA  . Hiatal hernia   . Hyperlipidemia   . Hypothyroidism   . PTE (post-transplant erythrocytosis)   . Pulmonary embolism ([place]) 2011   on coumadin  . UTI (lower urinary tract infection)      CURRENT MEDICATIONS: Reviewed  Patient's Medications  New Prescriptions   No medications on file  Previous Medications   BENFOTIAMINE 150 MG CAPS    Take 150 mg by  mouth daily.   CALCIUM CARB-CHOLECALCIFEROL (CALCIUM 600+D3) 600-800 MG-UNIT TABS    Take 1 tablet by mouth daily.   CHOLECALCIFEROL (VITAMIN D) 1000 UNITS TABLET    Take 1,000 Units by mouth daily.    DEXTROMETHORPHAN-GUAIFENESIN (MUCUS RELIEF DM PO)    Take 1 tablet by mouth daily.   DONEPEZIL (ARICEPT) 5 MG TABLET    Take 1 tablet (5 mg total) by mouth at bedtime.   GABAPENTIN (NEURONTIN) 300 MG CAPSULE    Take 1 capsule (300 mg total) by mouth 2 (two) times daily.   LEVOTHYROXINE (SYNTHROID, LEVOTHROID) 50 MCG TABLET    Take 1 tablet (50 mcg total) by mouth daily before breakfast.   LORATADINE (CLARITIN) 10 MG TABLET    Take 10 mg by mouth daily.    MEMANTINE (NAMENDA) 10 MG TABLET    Take 5 mg by mouth 2 (two) times daily.    MULTIPLE VITAMINS-MINERALS (PRESERVISION AREDS 2 PO)    Take 1 capsule by mouth 2 (two) times daily.   NITROSTAT 0.4 MG SL TABLET    Place 0.4 mg under the tongue every 5 (five) minutes as needed for chest pain (MAX 3 TABLETS).    OMEPRAZOLE (PRILOSEC) 20 MG CAPSULE    Take 20 mg by mouth 2 (two) times daily.  PRAVASTATIN (PRAVACHOL) 20 MG TABLET    TAKE 1 TABLET EVERY DAY   PROBIOTIC PRODUCT (PROBIOTIC DAILY PO)    Take 1 capsule by mouth daily.   SENNOSIDES-DOCUSATE SODIUM (SENNA-S PO)    Take 1 tablet by mouth as needed.    SERTRALINE (ZOLOFT) 25 MG TABLET    Take 3 tablets (75 mg total) by mouth daily. 25 mg in the mornings and 50 mg in the evenings   WARFARIN (COUMADIN) 5 MG TABLET    Take 1 tablet daily or as directed by the Coumadin Clinic.  Modified Medications   No medications on file  Discontinued Medications   No medications on file     Allergies  Allergen Reactions  . Alendronate Other (See Comments)    Can not tolerate  . Celebrex [Celecoxib] Other (See Comments)    Can not tolerate  . Meloxicam Other (See Comments)    Avoid   . Myrbetriq [Mirabegron] Other (See Comments)    Can not tolerate  . Norco [Hydrocodone-Acetaminophen] Other (See  Comments)    Dizziness and jerking  . Pradaxa [Dabigatran Etexilate Mesylate] Other (See Comments)    Can not tolerate  . Pradaxa [Dabigatran]   . Toviaz [Fesoterodine Fumarate Er] Other (See Comments)    Can not tolerate  . Vesicare [Solifenacin] Other (See Comments)    Can not tolerate     REVIEW OF SYSTEMS:  GENERAL: no change in appetite, no fatigue, no weight changes, no fever, chills or weakness SKIN: Denies rash, itching, wounds, ulcer sores, or nail abnormality EYES: Denies change in vision, dry eyes, eye pain, itching or discharge EARS: Denies change in hearing, ringing in ears, or earache NOSE: Denies nasal congestion or epistaxis MOUTH and THROAT: Denies oral discomfort, gingival pain or bleeding RESPIRATORY: no cough, SOB, DOE, wheezing, hemoptysis CARDIAC: no chest pain, edema or palpitations GI: no abdominal pain, diarrhea, constipation, heart burn, nausea or vomiting GU: +dysuria, mild pain on her suprapubic area NEUROLOGICAL: Denies dizziness, syncope, numbness, or headache PSYCHIATRIC: Denies feeling of depression or anxiety. No report of hallucinations, insomnia, paranoia, or agitation    LABS/RADIOLOGY: Labs reviewed: Basic Metabolic Panel: Recent Labs    12/04/17 0000 06/16/18 0810 07/25/18 1556  NA 143 146  --   K 4.3 4.4  --   CL 107 108  --   CO2 26 29  --   GLUCOSE 99 99  --   BUN 16 18 20   CREATININE 0.76 0.73 0.85  CALCIUM 9.7 9.7  --    Liver Function Tests: Recent Labs    12/04/17 0000 06/16/18 0810  AST 18 15  ALT 14 10  BILITOT 0.4 0.3  PROT 6.4 6.2   Recent Labs    06/16/18 0810  LIPASE 36   CBC: Recent Labs    12/04/17 0000 06/16/18 0810  WBC 6.6 6.7  NEUTROABS  --  3,966  HGB 13.1 12.6  HCT 40.0 38.5  MCV 88.1 87.7  PLT 240 232   Lipid Panel: Recent Labs    12/04/17 0000 03/12/18 0000  HDL 72 55    ASSESSMENT/PLAN:  1. Dysuria - encouraged to drink at least 8 cups of water in a day - will order  urinalysis with culture and sensitivity  - Culture, Urine; Future   Time spent on non face to face visit:  13  The patient gave consent to this telephone visit. Explained to the patient the risk and privacy issue that was involved with this telephone call.  The patient was advised to call back and ask for an in-person evaluation if the symptoms worsen or if the condition fails to improve.   Durenda Age, NP [employer] [phone]

## 2018-08-29 NOTE — Patient Instructions (Signed)
Dysuria - encouraged to drink at least 8 cups of water in a day - will order urinalysis with culture and sensitivity  Advised to call back and ask for an in-person evaluation if the symptoms worsen or if the condition fails to improve.

## 2018-08-29 NOTE — Addendum Note (Signed)
Addended by: Ruthell Rummage A on: 08/29/2018 03:26 PM   Modules accepted: Orders

## 2018-09-01 ENCOUNTER — Other Ambulatory Visit: Payer: Self-pay

## 2018-09-01 ENCOUNTER — Non-Acute Institutional Stay: Payer: Medicare Other

## 2018-09-01 DIAGNOSIS — R300 Dysuria: Secondary | ICD-10-CM

## 2018-09-02 LAB — URINALYSIS, ROUTINE W REFLEX MICROSCOPIC
Bilirubin Urine: NEGATIVE
Glucose, UA: NEGATIVE
Hgb urine dipstick: NEGATIVE
Hyaline Cast: NONE SEEN /LPF
Nitrite: NEGATIVE
Specific Gravity, Urine: 1.028 (ref 1.001–1.03)
WBC, UA: >=60 /HPF — AB (ref 0–5)
pH: <=5 (ref 5.0–8.0)

## 2018-09-02 LAB — URINE CULTURE
MICRO NUMBER:: 406842
SPECIMEN QUALITY:: ADEQUATE

## 2018-09-03 ENCOUNTER — Other Ambulatory Visit: Payer: Self-pay

## 2018-09-03 ENCOUNTER — Ambulatory Visit (INDEPENDENT_AMBULATORY_CARE_PROVIDER_SITE_OTHER): Payer: Medicare Other | Admitting: *Deleted

## 2018-09-03 DIAGNOSIS — I442 Atrioventricular block, complete: Secondary | ICD-10-CM

## 2018-09-03 LAB — CUP PACEART REMOTE DEVICE CHECK
Battery Impedance: 963 Ohm
Battery Remaining Longevity: 63 mo
Battery Voltage: 2.77 V
Brady Statistic AP VP Percent: 1 %
Brady Statistic AP VS Percent: 0 %
Brady Statistic AS VP Percent: 99 %
Brady Statistic AS VS Percent: 0 %
Date Time Interrogation Session: 20200422113737
Implantable Lead Implant Date: 19980518
Implantable Lead Implant Date: 20040301
Implantable Lead Location: 753859
Implantable Lead Location: 753860
Implantable Lead Model: 4269
Implantable Lead Model: 4285
Implantable Lead Serial Number: 249440
Implantable Lead Serial Number: 295540
Implantable Pulse Generator Implant Date: 20111206
Lead Channel Impedance Value: 612 Ohm
Lead Channel Impedance Value: 882 Ohm
Lead Channel Pacing Threshold Amplitude: 1.875 V
Lead Channel Pacing Threshold Pulse Width: 0.4 ms
Lead Channel Setting Pacing Amplitude: 2 V
Lead Channel Setting Pacing Amplitude: 2.5 V
Lead Channel Setting Pacing Pulse Width: 0.4 ms
Lead Channel Setting Sensing Sensitivity: 5.6 mV

## 2018-09-04 NOTE — Telephone Encounter (Signed)
Dr. Ammie Ferrier okay to continue to delay CT until after 10/13/18?

## 2018-09-10 ENCOUNTER — Other Ambulatory Visit: Payer: Self-pay | Admitting: Adult Health

## 2018-09-10 MED ORDER — CIPROFLOXACIN HCL 500 MG PO TABS: 500.0000 mg | ORAL_TABLET | Freq: Two times a day (BID) | ORAL | 0 refills | Status: AC

## 2018-09-10 MED ORDER — ACIDOPHILUS 100 MG PO CAPS: 1.0000 | ORAL_CAPSULE | Freq: Every day | ORAL | 0 refills | Status: AC

## 2018-09-11 NOTE — Progress Notes (Signed)
Remote pacemaker transmission.   

## 2018-09-12 ENCOUNTER — Telehealth: Payer: Self-pay | Admitting: *Deleted

## 2018-09-12 NOTE — Telephone Encounter (Signed)
Per spouse pt started Cipro BID on yesterday for 7 days. LMOM for nurse at [facility] to check INR on Monday and call us for orders if needed.

## 2018-09-15 ENCOUNTER — Ambulatory Visit (INDEPENDENT_AMBULATORY_CARE_PROVIDER_SITE_OTHER): Payer: Medicare Other | Admitting: Cardiovascular Disease

## 2018-09-15 DIAGNOSIS — R0602 Shortness of breath: Secondary | ICD-10-CM | POA: Diagnosis not present

## 2018-09-15 DIAGNOSIS — I480 Paroxysmal atrial fibrillation: Secondary | ICD-10-CM

## 2018-09-15 DIAGNOSIS — I4891 Unspecified atrial fibrillation: Secondary | ICD-10-CM | POA: Diagnosis not present

## 2018-09-15 DIAGNOSIS — Z7901 Long term (current) use of anticoagulants: Secondary | ICD-10-CM | POA: Diagnosis not present

## 2018-09-15 DIAGNOSIS — Z5181 Encounter for therapeutic drug level monitoring: Secondary | ICD-10-CM

## 2018-09-15 LAB — PROTIME-INR: INR: 2.5 — AB (ref 0.9–1.1)

## 2018-09-15 NOTE — Patient Instructions (Signed)
Description   Spoke with pt's husband and instructed him to have pt continue taking Coumadin 1 tablet daily.  Recheck in 2 weeks (when husband has INR checked). Orders faxed to [facility].

## 2018-09-18 NOTE — Telephone Encounter (Signed)
Called to reschedule CT but [place] [facility] already closed for the day.

## 2018-09-18 NOTE — Telephone Encounter (Signed)
I spoke to husband and she is still symptomatic  We should go ahead with the CT scan now

## 2018-09-22 ENCOUNTER — Telehealth: Payer: Self-pay | Admitting: Internal Medicine

## 2018-09-22 NOTE — Addendum Note (Signed)
Addended by: Marlon Pel on: 09/22/2018 11:34 AM   Modules accepted: Orders

## 2018-09-22 NOTE — Telephone Encounter (Signed)
Patient notified of CT rescheduled for 09/29/18 11:30 at [facility]. .  She verbalized understanding of all instructions.  She understands to come for labs this week.

## 2018-09-22 NOTE — Telephone Encounter (Signed)
Left message for patient to call back  

## 2018-09-23 NOTE — Telephone Encounter (Signed)
I spoke with the patient's husband.  He provided me the name of the coordinator at [facility] [phone].  They would like to have her labs drawn at [facility].  I left a message for Bailey Mech to call me back with a fax number I may send orders to

## 2018-09-23 NOTE — Telephone Encounter (Signed)
Orders are faxed to [phone].  Husband notified they will draw her lab on Thursday at their [facility] draw day at [facility].

## 2018-09-25 ENCOUNTER — Encounter: Payer: Self-pay | Admitting: Internal Medicine

## 2018-09-25 DIAGNOSIS — Z0189 Encounter for other specified special examinations: Secondary | ICD-10-CM | POA: Diagnosis not present

## 2018-09-26 ENCOUNTER — Telehealth: Payer: Self-pay | Admitting: Nurse Practitioner

## 2018-09-26 ENCOUNTER — Telehealth: Payer: Self-pay | Admitting: *Deleted

## 2018-09-26 NOTE — Telephone Encounter (Signed)
New Message            Patient is needing a back concerning labs that was done at [facility], Pls call to advise.

## 2018-09-26 NOTE — Telephone Encounter (Signed)
Telephoned spouse of pt to inform him that the test was ordered by GI and they should be calling to them to give further instructions.

## 2018-09-26 NOTE — Telephone Encounter (Signed)
Called and left message for Marcia Brash at [facility] at [phone] to fax over labs pt had done yesterday.

## 2018-09-26 NOTE — Telephone Encounter (Signed)
Follow Up:; ° ° °Returning your call. °

## 2018-09-26 NOTE — Telephone Encounter (Signed)
Not sure who to send call as not sure what labs pt referring to. I see future labs from PCP. Not  Sure if pt may be referring to her INR being checked? I will route to [facility] and to Chanetta Marshall, NP nurse [facility] for further follow up.

## 2018-09-26 NOTE — Telephone Encounter (Signed)
I spoke to the husband about the lab results and that we have not received them.  He verbalized understanding.

## 2018-09-26 NOTE — Telephone Encounter (Signed)
I spoke to the patient's husband who said that labs were drawn on 5/14 at [facility] to check patient prior to scheduled test.  I told him that I would look out for the results.

## 2018-09-26 NOTE — Telephone Encounter (Signed)

## 2018-09-29 ENCOUNTER — Other Ambulatory Visit: Payer: Self-pay

## 2018-09-29 ENCOUNTER — Ambulatory Visit (INDEPENDENT_AMBULATORY_CARE_PROVIDER_SITE_OTHER)
Admission: RE | Admit: 2018-09-29 | Discharge: 2018-09-29 | Disposition: A | Discharge status: Home / Self Care | Payer: Medicare Other | Source: Ambulatory Visit | Attending: Internal Medicine | Admitting: Internal Medicine

## 2018-09-29 DIAGNOSIS — R634 Abnormal weight loss: Secondary | ICD-10-CM | POA: Diagnosis not present

## 2018-09-29 DIAGNOSIS — R194 Change in bowel habit: Secondary | ICD-10-CM

## 2018-09-29 DIAGNOSIS — R10814 Left lower quadrant abdominal tenderness: Secondary | ICD-10-CM | POA: Diagnosis not present

## 2018-09-29 MED ORDER — IOHEXOL 240 MG/ML SOLN
100.0000 mL | Freq: Once | INTRAMUSCULAR | Status: AC | PRN
  Administered 2018-09-29: 100 mL via INTRAVENOUS

## 2018-09-30 NOTE — Progress Notes (Signed)
I reviewed CT findings with her I think pelvic floor PT could help her  Please contact [place] and find out if the PT there can do pelvic floor PT and let me know  Please get an ok from cardiology ([facility]) to hold warfarin in anticipation of an EGD to evaluate gastric polyp Will schedule that but not right now

## 2018-10-02 ENCOUNTER — Telehealth: Payer: Self-pay

## 2018-10-02 ENCOUNTER — Ambulatory Visit (INDEPENDENT_AMBULATORY_CARE_PROVIDER_SITE_OTHER): Payer: Medicare Other | Admitting: Cardiovascular Disease

## 2018-10-02 DIAGNOSIS — I4891 Unspecified atrial fibrillation: Secondary | ICD-10-CM | POA: Diagnosis not present

## 2018-10-02 DIAGNOSIS — I480 Paroxysmal atrial fibrillation: Secondary | ICD-10-CM | POA: Diagnosis not present

## 2018-10-02 DIAGNOSIS — Z5181 Encounter for therapeutic drug level monitoring: Secondary | ICD-10-CM

## 2018-10-02 LAB — PROTIME-INR: INR: 2.7 — AB (ref 0.9–1.1)

## 2018-10-02 NOTE — Patient Instructions (Signed)
Description   Spoke with pt's husband and instructed him to have pt continue taking Coumadin 1 tablet daily.  Recheck in 2 weeks (when husband has INR checked). Orders faxed to [facility].

## 2018-10-02 NOTE — Telephone Encounter (Signed)
Patient with diagnosis of afib on warfarin for anticoagulation.    Procedure:EGF Date of procedure: TBD  CHADS2-VASc score of  3 (CHF, HTN, AGE, DM2, stroke/tia x 2, CAD, AGE, female)  Patient does have a history of DVT/PE in 2011. However, was not continued long term and was resumed when patient presented with afib.  Per office protocol, patient can hold coumadin for 5 days prior to procedure.   Patient will NOT need bridging with Lovenox (enoxaparin) around procedure.

## 2018-10-02 NOTE — Telephone Encounter (Signed)
[facility] Pre-operative Risk Assessment     Request for surgical clearance:     Endoscopy Procedure  What type of surgery is being performed?     EGF  When is this surgery scheduled?     TBT  What type of clearance is required ?   Pharmacy  Are there any medications that need to be held prior to surgery and how long? Warfarin  Practice name and name of physician performing surgery?      [facility]  What is your office phone and fax number?      Phone- [phone]  [phone]  Anesthesia type (None, local, MAC, general) ?       MAC

## 2018-10-02 NOTE — Progress Notes (Signed)
OK  Order pelvic floor PT for pelvic floor weakness  Set up EGD off warfarin x 5 d   Dx abnormal CT stomach, suspected gastric polyp[

## 2018-10-02 NOTE — Telephone Encounter (Signed)
   Primary Cardiologist: Jenkins Rouge, MD  Chart reviewed as part of pre-operative protocol coverage.   Per pharmacy recommendations, patient can hold coumadin for 5 days prior to her upcoming procedure. She does not requiring lovenox bridging in the perioperative setting. Coumadin should be restarted when cleared to do so by her gastroenterologist.   I will route this recommendation to the requesting party via [place] fax function and remove from pre-op pool.  Please call with questions.  Abigail Butts, PA-C 10/02/2018, 5:17 PM

## 2018-10-07 NOTE — Telephone Encounter (Signed)
When do you want to schedule?

## 2018-10-07 NOTE — Telephone Encounter (Signed)
Patient has been scheduled for 10/20/18 and pre-visit for 10/10/18

## 2018-10-07 NOTE — Telephone Encounter (Signed)
Next week if she can do it - or when she is able in June

## 2018-10-10 ENCOUNTER — Other Ambulatory Visit: Payer: Self-pay

## 2018-10-10 ENCOUNTER — Ambulatory Visit: Payer: Medicare Other | Admitting: *Deleted

## 2018-10-10 VITALS — Ht 60.0 in | Wt 126.0 lb

## 2018-10-10 DIAGNOSIS — R9389 Abnormal findings on diagnostic imaging of other specified body structures: Secondary | ICD-10-CM

## 2018-10-10 NOTE — Progress Notes (Signed)
No egg or soy allergy known to patient  No issues with past sedation with any surgeries  or procedures, no intubation problems  No diet pills per patient No home 02 use per patient  No blood thinners per patient  Pt denies issues with constipation  No A fib or A flutter     Pt verified name, DOB, address and insurance during PV today. Pt mailed instruction packet to included paper to complete and mail back to [facility] with addressed and stamped envelope,  copy of consent form to read and not return, and instructions.  PV completed over the phone. Pt encouraged to call with questions or issues  

## 2018-10-15 ENCOUNTER — Other Ambulatory Visit: Payer: Self-pay

## 2018-10-15 ENCOUNTER — Non-Acute Institutional Stay: Payer: Medicare Other | Admitting: Internal Medicine

## 2018-10-15 ENCOUNTER — Encounter: Payer: Self-pay | Admitting: Internal Medicine

## 2018-10-15 VITALS — BP 122/70 | HR 83 | Temp 98.5°F | Ht 61.0 in | Wt 130.2 lb

## 2018-10-15 DIAGNOSIS — Z7901 Long term (current) use of anticoagulants: Secondary | ICD-10-CM

## 2018-10-15 DIAGNOSIS — R259 Unspecified abnormal involuntary movements: Secondary | ICD-10-CM

## 2018-10-15 DIAGNOSIS — E039 Hypothyroidism, unspecified: Secondary | ICD-10-CM | POA: Diagnosis not present

## 2018-10-15 DIAGNOSIS — E782 Mixed hyperlipidemia: Secondary | ICD-10-CM | POA: Diagnosis not present

## 2018-10-15 DIAGNOSIS — I480 Paroxysmal atrial fibrillation: Secondary | ICD-10-CM | POA: Diagnosis not present

## 2018-10-15 DIAGNOSIS — R4189 Other symptoms and signs involving cognitive functions and awareness: Secondary | ICD-10-CM | POA: Diagnosis not present

## 2018-10-15 DIAGNOSIS — K0500 Acute gingivitis, plaque induced: Secondary | ICD-10-CM

## 2018-10-15 DIAGNOSIS — R197 Diarrhea, unspecified: Secondary | ICD-10-CM | POA: Diagnosis not present

## 2018-10-15 DIAGNOSIS — Z950 Presence of cardiac pacemaker: Secondary | ICD-10-CM | POA: Diagnosis not present

## 2018-10-15 DIAGNOSIS — E785 Hyperlipidemia, unspecified: Secondary | ICD-10-CM | POA: Insufficient documentation

## 2018-10-15 MED ORDER — AMOXICILLIN-POT CLAVULANATE 500-125 MG PO TABS: 1.0000 | ORAL_TABLET | Freq: Three times a day (TID) | ORAL | 0 refills | Status: AC

## 2018-10-15 NOTE — Progress Notes (Signed)
Location:  [place] of Service:  Clinic (12)  Provider:   Code Status:  Goals of Care:  Advanced Directives 10/15/2018  Does Patient Have a Medical Advance Directive? No;Yes  Type of Advance Directive [place]  Does patient want to make changes to medical advance directive? -  Copy of [place] in Chart? Yes - validated most recent copy scanned in chart (See row information)  Would patient like information on creating a medical advance directive? No - Patient declined     Chief Complaint  Patient presents with  . Medical Management of Chronic Issues    4 month follow up,   . Health Maintenance    dexa scan     HPI: Patient is a 85 y.o. female seen today for medical management of chronic diseases.    Patient has h/o Chronic Atrial Fibrillation on Coumadin, S/P PPM for Heart Block, Hypothyroidism, Mild Cognitive disorder, Myoclonic Jerking Diagnosed in [facility] and follows with Neurology, Hypothyroidism, Anxiety, Depression  Patient doing well. Usually comes with her husband but he was not with her today Her main c/o Gingivitis with Pain and Bleeding. She has dental appointment in 2 weeks but does not feel she can get her teeth cleaned as it hurts. Continue to have Loose stool . GI planning for Pelvic Muscle Exercis and EGD for Gastric Polyp  Her mood is better No Jerking or Involuntary Movements Walks with the walker. Independent on her ADLS No Recent Falls Lives with her husband. Could not review her Meds as she did not Bring them today Weight is stable Appetite is good Past Medical History:  Diagnosis Date  . Arthritis   . Atrial fibrillation ([place])   . Cataract    removed bilateral  . Clotting disorder ([place])    h/o blood clot left leg  . Dementia ([place])   . Depression   . DVT (deep venous thrombosis) ([place]) 2011  . GERD (gastroesophageal reflux disease)   . HB (heart block)    s/p PPM, most recent generator  change 12/11 by JA  . Heart murmur   . Hiatal hernia   . Hyperlipidemia   . Hypertension   . Hypothyroidism   . Osteoporosis   . PTE (post-transplant erythrocytosis)   . Pulmonary embolism ([place]) 2011   on coumadin  . UTI (lower urinary tract infection)     Past Surgical History:  Procedure Laterality Date  . BLADDER SURGERY     twice  . cataracts     bilateral  . CHOLECYSTECTOMY  09/2010  . COLONOSCOPY    . ESOPHAGOGASTRODUODENOSCOPY    . HEMORRHOID SURGERY    . hysterectomy (other)    . INNER EAR SURGERY    . JOINT REPLACEMENT    . pacemaker     most recent generator 12/11 by JA  . TOTAL KNEE ARTHROPLASTY Left     Allergies  Allergen Reactions  . Alendronate Other (See Comments)    Can not tolerate  . Celebrex [Celecoxib] Other (See Comments)    Can not tolerate  . Meloxicam Other (See Comments)    Avoid   . Myrbetriq [Mirabegron] Other (See Comments)    Can not tolerate  . Norco [Hydrocodone-Acetaminophen] Other (See Comments)    Dizziness and jerking  . Pradaxa [Dabigatran Etexilate Mesylate] Other (See Comments)    Can not tolerate  . Pradaxa [Dabigatran]   . Toviaz [Fesoterodine Fumarate Er] Other (See Comments)  Can not tolerate  . Vesicare [Solifenacin] Other (See Comments)    Can not tolerate    Outpatient Encounter Medications as of 10/15/2018  Medication Sig  . Benfotiamine 150 MG CAPS Take 150 mg by mouth daily.  . Calcium Carb-Cholecalciferol (CALCIUM 600+D3) 600-800 MG-UNIT TABS Take 1 tablet by mouth daily.  . cholecalciferol (VITAMIN D) 1000 UNITS tablet Take 1,000 Units by mouth daily.   [name] Dextromethorphan-Guaifenesin (MUCUS RELIEF DM PO) Take 1 tablet by mouth daily.  [name] donepezil (ARICEPT) 5 MG tablet Take 1 tablet (5 mg total) by mouth at bedtime.  . gabapentin (NEURONTIN) 300 MG capsule Take 1 capsule (300 mg total) by mouth 2 (two) times daily.  [name] levothyroxine (SYNTHROID, LEVOTHROID) 50 MCG tablet Take 1 tablet (50 mcg total) by mouth  daily before breakfast.  . loratadine (CLARITIN) 10 MG tablet Take 10 mg by mouth daily.   . memantine (NAMENDA) 10 MG tablet Take 5 mg by mouth 2 (two) times daily.   . Multiple Vitamins-Minerals (PRESERVISION AREDS 2 PO) Take 1 capsule by mouth 2 (two) times daily.  [name] NITROSTAT 0.4 MG SL tablet Place 0.4 mg under the tongue every 5 (five) minutes as needed for chest pain (MAX 3 TABLETS).   [name] omeprazole (PRILOSEC) 20 MG capsule Take 20 mg by mouth 2 (two) times daily.   . pravastatin (PRAVACHOL) 20 MG tablet TAKE 1 TABLET EVERY DAY  . Probiotic Product (PROBIOTIC DAILY PO) Take 1 capsule by mouth daily.  Orlie Dakin Sodium (SENNA-S PO) Take 1 tablet by mouth as needed.   . sertraline (ZOLOFT) 25 MG tablet Take 3 tablets (75 mg total) by mouth daily. 25 mg in the mornings and 50 mg in the evenings  . warfarin (COUMADIN) 5 MG tablet Take 1 tablet daily or as directed by the Coumadin Clinic.   No facility-administered encounter medications on file as of 10/15/2018.     Review of Systems:  Review of Systems  HENT: Positive for dental problem.   Cardiovascular: Positive for leg swelling.  Gastrointestinal: Positive for diarrhea.  Psychiatric/Behavioral: Positive for confusion.  All other systems reviewed and are negative.   Health Maintenance  Topic Date Due  . DEXA SCAN  04/29/1990  . INFLUENZA VACCINE  12/13/2018  . TETANUS/TDAP  02/11/2022  . PNA vac Low Risk Adult  Completed    Physical Exam: Vitals:   10/15/18 1404  BP: 122/70  Pulse: 83  Temp: 98.5 F (36.9 C)  TempSrc: Oral  SpO2: 90%  Weight: 130 lb 3.2 oz (59.1 kg)  Height: 5\' 1"  (1.549 m)   Body mass index is 24.6 kg/m. Physical Exam Constitutional:      Appearance: Normal appearance.  HENT:     Head: Normocephalic.     Nose: Nose normal.     Mouth/Throat:     Mouth: Mucous membranes are moist.     Comments: Severe Swollen lower gums which were tender and she had Plaque build Up Eyes:     Pupils:  Pupils are equal, round, and reactive to light.  Cardiovascular:     Rate and Rhythm: Normal rate.     Heart sounds: Murmur present.  Pulmonary:     Effort: Pulmonary effort is normal. No respiratory distress.     Breath sounds: Normal breath sounds.  Abdominal:     General: Abdomen is flat. Bowel sounds are normal.     Palpations: Abdomen is soft.  Musculoskeletal:     Comments: Mild Edema Bilateral  Skin:  General: Skin is warm.  Neurological:     General: No focal deficit present.     Mental Status: She is alert.     Comments: Walks with Walker  Psychiatric:        Mood and Affect: Mood normal.        Thought Content: Thought content normal.        Judgment: Judgment normal.     Labs reviewed: Basic Metabolic Panel: Recent Labs    12/04/17 0000 03/12/18 0000 06/16/18 0810 07/25/18 1556  NA 143  --  146  --   K 4.3  --  4.4  --   CL 107  --  108  --   CO2 26  --  29  --   GLUCOSE 99  --  99  --   BUN 16  --  18 20  CREATININE 0.76  --  0.73 0.85  CALCIUM 9.7  --  9.7  --   TSH 2.55 3.37  --   --    Liver Function Tests: Recent Labs    12/04/17 0000 06/16/18 0810  AST 18 15  ALT 14 10  BILITOT 0.4 0.3  PROT 6.4 6.2   Recent Labs    06/16/18 0810  LIPASE 36   No results for input(s): AMMONIA in the last 8760 hours. CBC: Recent Labs    12/04/17 0000 06/16/18 0810  WBC 6.6 6.7  NEUTROABS  --  3,966  HGB 13.1 12.6  HCT 40.0 38.5  MCV 88.1 87.7  PLT 240 232   Lipid Panel: Recent Labs    12/04/17 0000 03/12/18 0000  CHOL 188 175  HDL 72 55  LDLCALC 94 93  TRIG 128 169*  CHOLHDL 2.6 3.2   No results found for: HGBA1C  Procedures since last visit: Ct Abdomen Pelvis W Contrast  Result Date: 09/29/2018 CLINICAL DATA:  Left lower quadrant tenderness, weight loss, change in bowel habits. EXAM: CT ABDOMEN AND PELVIS WITH CONTRAST TECHNIQUE: Multidetector CT imaging of the abdomen and pelvis was performed using the standard protocol  following bolus administration of intravenous contrast. CONTRAST:  156mL OMNIPAQUE IOHEXOL 240 MG/ML SOLN COMPARISON:  CT abdomen dated 11/21/2011 FINDINGS: Lower chest: Pacer leads noted. Calcifications of the aortic and mitral valve. Right coronary artery atherosclerotic calcification. Mild cardiomegaly. Hepatobiliary: 0.9 by 0.5 cm hypodense lesion in segment 2 of the liver, image 15/2, previously the same. 0.8 by 0.5 cm hypodense lesion in the right hepatic lobe on image 17/2, vaguely seen on the prior exam from 2013. Cholecystectomy. Borderline prominence of the extrahepatic biliary tree likely representing a physiologic response to cholecystectomy. Pancreas: Unremarkable Spleen: Unremarkable Adrenals/Urinary Tract: 0.4 cm hypodense lesion in the right kidney upper pole on image 12/5, potentially a small cyst or angiomyolipoma. To the right of the urethra and just below the urinary bladder, a fluid density 1.6 by 1.4 by 2.4 cm lesion is present and could be a bladder diverticulum or a urethral diverticulum. There is some calcification along the right margin of the urethra. Stomach/Bowel: A 1.1 cm structure in the distal stomach body on image 23/2 is somewhat polypoid and could be a gastric polyp or possibly food content within the stomach. This was not readily apparent on 11/21/2011. Periampullary duodenal diverticulum. Low position of the anorectal junction suggesting pelvic floor laxity. Vascular/Lymphatic: Aortoiliac atherosclerotic vascular disease. Reproductive: Uterus absent.  Adnexa unremarkable. Other: No supplemental non-categorized findings. Musculoskeletal: Unremarkable IMPRESSION: 1. No significant diverticulosis or diverticulitis is identified. 2. Hypodensity below  the right-sided the urinary bladder possibly a bladder or urethral diverticulum but is technically nonspecific. This is hypodense but slightly higher in density than the urine in the urinary bladder. The appearance is similar to the  11/21/2011 exam hence this is not thought to represent malignancy. Also correlate with any history of urinary incontinence procedure in this vicinity. 3. 1.1 cm structure in the distal stomach body could be a gastric polyp or possibly simply adherent food material. 4. Low position of the anorectal junction compatible with pelvic floor laxity. 5. Other imaging findings of potential clinical significance: Aortic Atherosclerosis (ICD10-I70.0). Calcifications of the aortic and mitral valve along with the right coronary artery. Mild cardiomegaly. Small hypodense lesions in the liver appear benign and roughly stable from 2013. Stable tiny cyst or angiomyolipoma of the right kidney upper pole. Periampullary duodenal diverticulum. Electronically Signed   By: Van Clines M.D.   On: 09/29/2018 16:06    Assessment/Plan Paroxysmal atrial fibrillation (HCC) -  Rate Control  On Coumadin Follow with Coumadin clinic S/P PPM  Acute gingivitis, plaque induced Will treat her With Augmentin 500 mg TID for 5 days She has appointment with Dentist in 2 weeks  Acquired hypothyroidism - Continue Supplement  Plan: TSH next visit  Pacemaker-[employer] Rate Stable Long term (current) use of anticoagulants Follows with Coumadin clinic  Involuntary movements Per Dr Doy Mince Neurologist at [facility] note from 07/19 She has h/o Myoclonic Jerks. He has tried different regimes. And now has her on Zoloft and Neurontin.He does not think she has NeuroDegenerative disorder and think it is functional Her Symptoms are controlled Diarrhea in adult patient GI pathogens Negative Pelvic floor Laxity per CT CT scan also showed Gastric Polyp Plan for EGD By GI  Also Recommended Pelvic Muscle exercise  Cognitive impairment Continue Aricept and Namenda  Mixed hyperlipidemia - On Pravachol   Plan: Lipid Panel     Labs/tests ordered:   Next appt:  In 3 months  Total time spent in this patient care encounter was  40_   minutes; greater than 50% of the visit spent counseling patient and staff, reviewing records , Labs and coordinating care for problems addressed at this encounter.

## 2018-10-16 ENCOUNTER — Ambulatory Visit (INDEPENDENT_AMBULATORY_CARE_PROVIDER_SITE_OTHER): Payer: Medicare Other | Admitting: Pharmacist

## 2018-10-16 DIAGNOSIS — I480 Paroxysmal atrial fibrillation: Secondary | ICD-10-CM

## 2018-10-16 DIAGNOSIS — I4891 Unspecified atrial fibrillation: Secondary | ICD-10-CM | POA: Diagnosis not present

## 2018-10-16 DIAGNOSIS — Z5181 Encounter for therapeutic drug level monitoring: Secondary | ICD-10-CM

## 2018-10-16 LAB — POCT INR: INR: 1.6 — AB (ref 2.0–3.0)

## 2018-10-17 ENCOUNTER — Telehealth: Payer: Self-pay | Admitting: *Deleted

## 2018-10-17 NOTE — Telephone Encounter (Signed)
Covid-19 screening questions  Have you traveled in the last 14 days? No. If yes where?  Do you now or have you had a fever in the last 14 days? No.  Do you have any respiratory symptoms of shortness of breath or cough now or in the last 14 days? No.  Do you have any family members or close contacts with diagnosed or suspected Covid-19 in the past 14 days? No.  Have you been tested for Covid-19 and found to be positive? No.       

## 2018-10-20 ENCOUNTER — Encounter: Payer: Self-pay | Admitting: Internal Medicine

## 2018-10-20 ENCOUNTER — Other Ambulatory Visit: Payer: Self-pay

## 2018-10-20 ENCOUNTER — Ambulatory Visit (AMBULATORY_SURGERY_CENTER): Payer: Medicare Other | Admitting: Internal Medicine

## 2018-10-20 VITALS — BP 160/89 | HR 91 | Temp 99.2°F | Resp 17 | Ht 60.0 in | Wt 134.0 lb

## 2018-10-20 DIAGNOSIS — K317 Polyp of stomach and duodenum: Secondary | ICD-10-CM

## 2018-10-20 DIAGNOSIS — R9389 Abnormal findings on diagnostic imaging of other specified body structures: Secondary | ICD-10-CM

## 2018-10-20 DIAGNOSIS — R109 Unspecified abdominal pain: Secondary | ICD-10-CM | POA: Diagnosis not present

## 2018-10-20 MED ORDER — SODIUM CHLORIDE 0.9 % IV SOLN: 500.0000 mL | Freq: Once | INTRAVENOUS | Status: DC

## 2018-10-20 NOTE — Progress Notes (Signed)
No problems noted in the recovery room. maw 

## 2018-10-20 NOTE — Op Note (Signed)
[place] Patient Name: [name] Procedure Date: 10/20/2018 12:59 PM MRN: [id number] Endoscopist: [name] , MD Age: 85 Referring MD:  Date of Birth: 1922-09-29 Gender: Female Account #: [id number] Procedure:                Upper GI endoscopy Indications:              Abnormal CT of the GI tract Medicines:                Propofol per Anesthesia, Monitored Anesthesia Care Procedure:                Pre-Anesthesia Assessment:                           - Prior to the procedure, a History and Physical                            was performed, and patient medications and                            allergies were reviewed. The patient's tolerance of                            previous anesthesia was also reviewed. The risks                            and benefits of the procedure and the sedation                            options and risks were discussed with the patient.                            All questions were answered, and informed consent                            was obtained. Prior Anticoagulants: The patient                            last took Coumadin (warfarin) 5 days prior to the                            procedure. ASA Grade Assessment: III - A patient                            with severe systemic disease. After reviewing the                            risks and benefits, the patient was deemed in                            satisfactory condition to undergo the procedure.                           After obtaining informed consent, the endoscope was  passed under direct vision. Throughout the                            procedure, the patient's blood pressure, pulse, and                            oxygen saturations were monitored continuously. The                            Endoscope was introduced through the mouth, and                            advanced to the second part of duodenum. The upper       GI endoscopy was accomplished without difficulty.                            The patient tolerated the procedure well. Scope In: Scope Out: Findings:                 A single 12 mm pedunculated and sessile polyp with                            no bleeding and stigmata of recent bleeding                            (inflamed/friable) was found in the gastric body.                            The polyp was removed with a hot snare. Resection                            and retrieval were complete. Verification of                            patient identification for the specimen was done.                            Estimated blood loss was minimal. There was a very                            slight ooze/trickle of blood. To prevent bleeding                            after the polypectomy, two hemostatic clips were                            successfully placed (MR conditional). There was no                            bleeding at the end of the procedure.                           The exam was otherwise without abnormality.  The cardia and gastric fundus were normal on                            retroflexion. Complications:            No immediate complications. Estimated Blood Loss:     Estimated blood loss was minimal. Impression:               - A single gastric polyp. Resected and retrieved.                            Clips (MR conditional) were placed.                           - The examination was otherwise normal. Recommendation:           - Patient has a contact number available for                            emergencies. The signs and symptoms of potential                            delayed complications were discussed with the                            patient. Return to normal activities tomorrow.                            Written discharge instructions were provided to the                            patient.                           - Resume  previous diet.                           - Continue present medications.                           - Resume Coumadin (warfarin) at prior dose today.                            Refer to Coumadin Clinic for further adjustment of                            therapy.                           - Await pathology results. [name], MD 10/20/2018 1:28:47 PM This report has been signed electronically.

## 2018-10-20 NOTE — Progress Notes (Signed)
Called to room to assist during endoscopic procedure.  Patient ID and intended procedure confirmed with present staff. Received instructions for my participation in the procedure from the performing physician.  

## 2018-10-20 NOTE — Progress Notes (Signed)
Pt's states no medical or surgical changes since previsit or office visit.  Temps taken by CW V/S taken by Izora Gala

## 2018-10-20 NOTE — Patient Instructions (Addendum)
I saw the polyp and removed it. I believe it is benign but will let you know the results from pathology.  Restart warfarin tonight at 5 mg dose and take that nightly until you go for next INR on 6/18 as per the anti-coagulation clinic.  I appreciate the opportunity to care for you. [name], MD, FACG    YOU HAD AN ENDOSCOPIC PROCEDURE TODAY AT [facility]:   Refer to the procedure report that was given to you for any specific questions about what was found during the examination.  If the procedure report does not answer your questions, please call your gastroenterologist to clarify.  If you requested that your care partner not be given the details of your procedure findings, then the procedure report has been included in a sealed envelope for you to review at your convenience later.  YOU SHOULD EXPECT: Some feelings of bloating in the abdomen. Passage of more gas than usual.  Walking can help get rid of the air that was put into your GI tract during the procedure and reduce the bloating. If you had a lower endoscopy (such as a colonoscopy or flexible sigmoidoscopy) you may notice spotting of blood in your stool or on the toilet paper. If you underwent a bowel prep for your procedure, you may not have a normal bowel movement for a few days.  Please Note:  You might notice some irritation and congestion in your nose or some drainage.  This is from the oxygen used during your procedure.  There is no need for concern and it should clear up in a day or so.  SYMPTOMS TO REPORT IMMEDIATELY:    Following upper endoscopy (EGD)  Vomiting of blood or coffee ground material  New chest pain or pain under the shoulder blades  Painful or persistently difficult swallowing  New shortness of breath  Fever of 100F or higher  Black, tarry-looking stools  For urgent or emergent issues, a gastroenterologist can be reached at any hour by calling [phone].   DIET:  We  do recommend a small meal at first, but then you may proceed to your regular diet.  Drink plenty of fluids but you should avoid alcoholic beverages for 24 hours.  ACTIVITY:  You should plan to take it easy for the rest of today and you should NOT DRIVE or use heavy machinery until tomorrow (because of the sedation medicines used during the test).    FOLLOW UP: Our staff will call the number listed on your records 48-72 hours following your procedure to check on you and address any questions or concerns that you may have regarding the information given to you following your procedure. If we do not reach you, we will leave a message.  We will attempt to reach you two times.  During this call, we will ask if you have developed any symptoms of COVID 19. If you develop any symptoms (ie: fever, flu-like symptoms, shortness of breath, cough etc.) before then, please call [phone].  If you test positive for Covid 19 in the 2 weeks post procedure, please call and report this information to [place].    If any biopsies were taken you will be contacted by phone or by letter within the next 1-3 weeks.  Please call us at [phone] if you have not heard about the biopsies in 3 weeks.    SIGNATURES/CONFIDENTIALITY: You and/or your care partner have signed paperwork which will be entered into your  electronic medical record.  These signatures attest to the fact that that the information above on your After Visit Summary has been reviewed and is understood.  Full responsibility of the confidentiality of this discharge information lies with you and/or your care-partner.    Per Dr. [name] resume COUMADIN (Warfarin) at prior dose today.  Refer to Coumadin Clinic for further adjustment of therapy. You may resume your other current medications today. Await biopsy results. Please call if any questions or concerns.

## 2018-10-20 NOTE — Progress Notes (Signed)
Report given to PACU, vss 

## 2018-10-22 ENCOUNTER — Telehealth: Payer: Self-pay | Admitting: *Deleted

## 2018-10-22 NOTE — Telephone Encounter (Signed)
  Follow up Call-  Call back number 10/20/2018  Post procedure Call Back phone  # [phone]  Permission to leave phone message Yes  Some recent data might be hidden     Patient questions:  Do you have a fever, pain , or abdominal swelling? No. Pain Score  0 *  Have you tolerated food without any problems? Yes.    Have you been able to return to your normal activities? Yes.    Do you have any questions about your discharge instructions: Diet   No. Medications  No. Follow up visit  No.  Do you have questions or concerns about your Care? No.  Actions: * If pain score is 4 or above: No action needed, pain <4.  1. Have you developed a fever since your procedure? NO  2.   Have you had an respiratory symptoms (SOB or cough) since your procedure? NO  3.   Have you tested positive for COVID 19 since your procedure NO  4.   Have you had any family members/close contacts diagnosed with the COVID 19 since your procedure?  NO   If yes to any of these questions please route to Joylene John, RN and Alphonsa Gin, RN.

## 2018-10-24 NOTE — Progress Notes (Signed)
Husband called back and I communicated polyp results benign hyperplastic polyp.  Physical therapy is to assess her at friend's home next week.  Follow-up me as needed.

## 2018-10-24 NOTE — Progress Notes (Signed)
[facility] - no letter/recall  I did call patient and daughter and left both messages - asked patient to call back  Polyp is benign and not a problem Also wanted to know if she was able to get pelvic floor PT at [facility]? If not then would need to go to [facility] PT at [facility]

## 2018-10-30 DIAGNOSIS — N3946 Mixed incontinence: Secondary | ICD-10-CM | POA: Diagnosis not present

## 2018-10-30 DIAGNOSIS — R41841 Cognitive communication deficit: Secondary | ICD-10-CM | POA: Diagnosis not present

## 2018-10-30 DIAGNOSIS — R2681 Unsteadiness on feet: Secondary | ICD-10-CM | POA: Diagnosis not present

## 2018-10-30 DIAGNOSIS — R2689 Other abnormalities of gait and mobility: Secondary | ICD-10-CM | POA: Diagnosis not present

## 2018-10-30 DIAGNOSIS — R260 Ataxic gait: Secondary | ICD-10-CM | POA: Diagnosis not present

## 2018-10-30 DIAGNOSIS — Z7901 Long term (current) use of anticoagulants: Secondary | ICD-10-CM | POA: Diagnosis not present

## 2018-10-30 DIAGNOSIS — I5023 Acute on chronic systolic (congestive) heart failure: Secondary | ICD-10-CM | POA: Diagnosis not present

## 2018-10-30 DIAGNOSIS — M6281 Muscle weakness (generalized): Secondary | ICD-10-CM | POA: Diagnosis not present

## 2018-10-30 LAB — PROTIME-INR: INR: 1.4 — AB (ref 0.9–1.1)

## 2018-10-31 ENCOUNTER — Ambulatory Visit (INDEPENDENT_AMBULATORY_CARE_PROVIDER_SITE_OTHER): Payer: Medicare Other

## 2018-10-31 DIAGNOSIS — I480 Paroxysmal atrial fibrillation: Secondary | ICD-10-CM

## 2018-10-31 DIAGNOSIS — Z5181 Encounter for therapeutic drug level monitoring: Secondary | ICD-10-CM | POA: Diagnosis not present

## 2018-10-31 NOTE — Patient Instructions (Signed)
Description   Spoke with pt's husband and instructed him to have pt take 1.5 tablets today and tomorrow, then start taking Coumadin 1 tablet daily except 1.5 tablets on Fridays.  Recheck in 2 weeks.  Orders faxed to [facility].

## 2018-11-03 DIAGNOSIS — M1990 Unspecified osteoarthritis, unspecified site: Secondary | ICD-10-CM | POA: Diagnosis not present

## 2018-11-03 DIAGNOSIS — M545 Low back pain: Secondary | ICD-10-CM | POA: Diagnosis not present

## 2018-11-03 DIAGNOSIS — M7060 Trochanteric bursitis, unspecified hip: Secondary | ICD-10-CM | POA: Diagnosis not present

## 2018-11-03 DIAGNOSIS — M7582 Other shoulder lesions, left shoulder: Secondary | ICD-10-CM | POA: Diagnosis not present

## 2018-11-03 DIAGNOSIS — M810 Age-related osteoporosis without current pathological fracture: Secondary | ICD-10-CM | POA: Diagnosis not present

## 2018-11-03 DIAGNOSIS — M25559 Pain in unspecified hip: Secondary | ICD-10-CM | POA: Diagnosis not present

## 2018-11-03 DIAGNOSIS — M25512 Pain in left shoulder: Secondary | ICD-10-CM | POA: Diagnosis not present

## 2018-11-04 DIAGNOSIS — R41841 Cognitive communication deficit: Secondary | ICD-10-CM | POA: Diagnosis not present

## 2018-11-04 DIAGNOSIS — N3946 Mixed incontinence: Secondary | ICD-10-CM | POA: Diagnosis not present

## 2018-11-04 DIAGNOSIS — R2689 Other abnormalities of gait and mobility: Secondary | ICD-10-CM | POA: Diagnosis not present

## 2018-11-04 DIAGNOSIS — R2681 Unsteadiness on feet: Secondary | ICD-10-CM | POA: Diagnosis not present

## 2018-11-04 DIAGNOSIS — M6281 Muscle weakness (generalized): Secondary | ICD-10-CM | POA: Diagnosis not present

## 2018-11-04 DIAGNOSIS — R260 Ataxic gait: Secondary | ICD-10-CM | POA: Diagnosis not present

## 2018-11-05 ENCOUNTER — Other Ambulatory Visit: Payer: Self-pay | Admitting: Nurse Practitioner

## 2018-11-05 DIAGNOSIS — R2689 Other abnormalities of gait and mobility: Secondary | ICD-10-CM | POA: Diagnosis not present

## 2018-11-05 DIAGNOSIS — R2681 Unsteadiness on feet: Secondary | ICD-10-CM | POA: Diagnosis not present

## 2018-11-05 DIAGNOSIS — R41841 Cognitive communication deficit: Secondary | ICD-10-CM | POA: Diagnosis not present

## 2018-11-05 DIAGNOSIS — R260 Ataxic gait: Secondary | ICD-10-CM | POA: Diagnosis not present

## 2018-11-05 DIAGNOSIS — M6281 Muscle weakness (generalized): Secondary | ICD-10-CM | POA: Diagnosis not present

## 2018-11-05 DIAGNOSIS — N3946 Mixed incontinence: Secondary | ICD-10-CM | POA: Diagnosis not present

## 2018-11-06 DIAGNOSIS — R41841 Cognitive communication deficit: Secondary | ICD-10-CM | POA: Diagnosis not present

## 2018-11-06 DIAGNOSIS — M6281 Muscle weakness (generalized): Secondary | ICD-10-CM | POA: Diagnosis not present

## 2018-11-06 DIAGNOSIS — R260 Ataxic gait: Secondary | ICD-10-CM | POA: Diagnosis not present

## 2018-11-06 DIAGNOSIS — R2689 Other abnormalities of gait and mobility: Secondary | ICD-10-CM | POA: Diagnosis not present

## 2018-11-06 DIAGNOSIS — R2681 Unsteadiness on feet: Secondary | ICD-10-CM | POA: Diagnosis not present

## 2018-11-06 DIAGNOSIS — N3946 Mixed incontinence: Secondary | ICD-10-CM | POA: Diagnosis not present

## 2018-11-08 DIAGNOSIS — R41841 Cognitive communication deficit: Secondary | ICD-10-CM | POA: Diagnosis not present

## 2018-11-08 DIAGNOSIS — R2689 Other abnormalities of gait and mobility: Secondary | ICD-10-CM | POA: Diagnosis not present

## 2018-11-08 DIAGNOSIS — M6281 Muscle weakness (generalized): Secondary | ICD-10-CM | POA: Diagnosis not present

## 2018-11-08 DIAGNOSIS — R2681 Unsteadiness on feet: Secondary | ICD-10-CM | POA: Diagnosis not present

## 2018-11-08 DIAGNOSIS — N3946 Mixed incontinence: Secondary | ICD-10-CM | POA: Diagnosis not present

## 2018-11-08 DIAGNOSIS — R260 Ataxic gait: Secondary | ICD-10-CM | POA: Diagnosis not present

## 2018-11-10 DIAGNOSIS — M6281 Muscle weakness (generalized): Secondary | ICD-10-CM | POA: Diagnosis not present

## 2018-11-10 DIAGNOSIS — R41841 Cognitive communication deficit: Secondary | ICD-10-CM | POA: Diagnosis not present

## 2018-11-10 DIAGNOSIS — R260 Ataxic gait: Secondary | ICD-10-CM | POA: Diagnosis not present

## 2018-11-10 DIAGNOSIS — N3946 Mixed incontinence: Secondary | ICD-10-CM | POA: Diagnosis not present

## 2018-11-10 DIAGNOSIS — R2689 Other abnormalities of gait and mobility: Secondary | ICD-10-CM | POA: Diagnosis not present

## 2018-11-10 DIAGNOSIS — R2681 Unsteadiness on feet: Secondary | ICD-10-CM | POA: Diagnosis not present

## 2018-11-11 DIAGNOSIS — N3946 Mixed incontinence: Secondary | ICD-10-CM | POA: Diagnosis not present

## 2018-11-11 DIAGNOSIS — R41841 Cognitive communication deficit: Secondary | ICD-10-CM | POA: Diagnosis not present

## 2018-11-11 DIAGNOSIS — R2681 Unsteadiness on feet: Secondary | ICD-10-CM | POA: Diagnosis not present

## 2018-11-11 DIAGNOSIS — R2689 Other abnormalities of gait and mobility: Secondary | ICD-10-CM | POA: Diagnosis not present

## 2018-11-11 DIAGNOSIS — R260 Ataxic gait: Secondary | ICD-10-CM | POA: Diagnosis not present

## 2018-11-11 DIAGNOSIS — M6281 Muscle weakness (generalized): Secondary | ICD-10-CM | POA: Diagnosis not present

## 2018-11-12 DIAGNOSIS — N3946 Mixed incontinence: Secondary | ICD-10-CM | POA: Diagnosis not present

## 2018-11-12 DIAGNOSIS — R2689 Other abnormalities of gait and mobility: Secondary | ICD-10-CM | POA: Diagnosis not present

## 2018-11-12 DIAGNOSIS — R2681 Unsteadiness on feet: Secondary | ICD-10-CM | POA: Diagnosis not present

## 2018-11-12 DIAGNOSIS — R41841 Cognitive communication deficit: Secondary | ICD-10-CM | POA: Diagnosis not present

## 2018-11-12 DIAGNOSIS — M6281 Muscle weakness (generalized): Secondary | ICD-10-CM | POA: Diagnosis not present

## 2018-11-13 ENCOUNTER — Ambulatory Visit (INDEPENDENT_AMBULATORY_CARE_PROVIDER_SITE_OTHER): Payer: Medicare Other | Admitting: Cardiovascular Disease

## 2018-11-13 DIAGNOSIS — M6281 Muscle weakness (generalized): Secondary | ICD-10-CM | POA: Diagnosis not present

## 2018-11-13 DIAGNOSIS — R2681 Unsteadiness on feet: Secondary | ICD-10-CM | POA: Diagnosis not present

## 2018-11-13 DIAGNOSIS — R41841 Cognitive communication deficit: Secondary | ICD-10-CM | POA: Diagnosis not present

## 2018-11-13 DIAGNOSIS — I4891 Unspecified atrial fibrillation: Secondary | ICD-10-CM | POA: Diagnosis not present

## 2018-11-13 DIAGNOSIS — Z5181 Encounter for therapeutic drug level monitoring: Secondary | ICD-10-CM | POA: Diagnosis not present

## 2018-11-13 DIAGNOSIS — I480 Paroxysmal atrial fibrillation: Secondary | ICD-10-CM | POA: Diagnosis not present

## 2018-11-13 DIAGNOSIS — R2689 Other abnormalities of gait and mobility: Secondary | ICD-10-CM | POA: Diagnosis not present

## 2018-11-13 DIAGNOSIS — N3946 Mixed incontinence: Secondary | ICD-10-CM | POA: Diagnosis not present

## 2018-11-13 LAB — PROTIME-INR: INR: 1.8 — AB (ref 0.9–1.1)

## 2018-11-13 NOTE — Patient Instructions (Addendum)
Description   Spoke with pt's husband and instructed him to have pt take 1.5 tablets today, then continue taking Coumadin 1 tablet daily except 1.5 tablets on Fridays.  Recheck in 1 weeks.  Orders faxed to [facility].

## 2018-11-14 DIAGNOSIS — N3946 Mixed incontinence: Secondary | ICD-10-CM | POA: Diagnosis not present

## 2018-11-14 DIAGNOSIS — R2681 Unsteadiness on feet: Secondary | ICD-10-CM | POA: Diagnosis not present

## 2018-11-14 DIAGNOSIS — M6281 Muscle weakness (generalized): Secondary | ICD-10-CM | POA: Diagnosis not present

## 2018-11-14 DIAGNOSIS — R2689 Other abnormalities of gait and mobility: Secondary | ICD-10-CM | POA: Diagnosis not present

## 2018-11-14 DIAGNOSIS — R41841 Cognitive communication deficit: Secondary | ICD-10-CM | POA: Diagnosis not present

## 2018-11-18 DIAGNOSIS — N3946 Mixed incontinence: Secondary | ICD-10-CM | POA: Diagnosis not present

## 2018-11-18 DIAGNOSIS — R41841 Cognitive communication deficit: Secondary | ICD-10-CM | POA: Diagnosis not present

## 2018-11-18 DIAGNOSIS — R2681 Unsteadiness on feet: Secondary | ICD-10-CM | POA: Diagnosis not present

## 2018-11-18 DIAGNOSIS — M6281 Muscle weakness (generalized): Secondary | ICD-10-CM | POA: Diagnosis not present

## 2018-11-18 DIAGNOSIS — R2689 Other abnormalities of gait and mobility: Secondary | ICD-10-CM | POA: Diagnosis not present

## 2018-11-20 ENCOUNTER — Ambulatory Visit (INDEPENDENT_AMBULATORY_CARE_PROVIDER_SITE_OTHER): Payer: Medicare Other | Admitting: Cardiology

## 2018-11-20 DIAGNOSIS — Z5181 Encounter for therapeutic drug level monitoring: Secondary | ICD-10-CM

## 2018-11-20 DIAGNOSIS — I480 Paroxysmal atrial fibrillation: Secondary | ICD-10-CM | POA: Diagnosis not present

## 2018-11-20 DIAGNOSIS — N3946 Mixed incontinence: Secondary | ICD-10-CM | POA: Diagnosis not present

## 2018-11-20 DIAGNOSIS — M6281 Muscle weakness (generalized): Secondary | ICD-10-CM | POA: Diagnosis not present

## 2018-11-20 DIAGNOSIS — R2681 Unsteadiness on feet: Secondary | ICD-10-CM | POA: Diagnosis not present

## 2018-11-20 DIAGNOSIS — R2689 Other abnormalities of gait and mobility: Secondary | ICD-10-CM | POA: Diagnosis not present

## 2018-11-20 DIAGNOSIS — R41841 Cognitive communication deficit: Secondary | ICD-10-CM | POA: Diagnosis not present

## 2018-11-20 LAB — POCT INR: INR: 2.1 (ref 2.0–3.0)

## 2018-11-22 ENCOUNTER — Other Ambulatory Visit: Payer: Self-pay | Admitting: Internal Medicine

## 2018-11-26 DIAGNOSIS — R2689 Other abnormalities of gait and mobility: Secondary | ICD-10-CM | POA: Diagnosis not present

## 2018-11-26 DIAGNOSIS — R2681 Unsteadiness on feet: Secondary | ICD-10-CM | POA: Diagnosis not present

## 2018-11-26 DIAGNOSIS — N3946 Mixed incontinence: Secondary | ICD-10-CM | POA: Diagnosis not present

## 2018-11-26 DIAGNOSIS — R41841 Cognitive communication deficit: Secondary | ICD-10-CM | POA: Diagnosis not present

## 2018-11-26 DIAGNOSIS — M6281 Muscle weakness (generalized): Secondary | ICD-10-CM | POA: Diagnosis not present

## 2018-12-03 ENCOUNTER — Ambulatory Visit (INDEPENDENT_AMBULATORY_CARE_PROVIDER_SITE_OTHER): Payer: Medicare Other | Admitting: *Deleted

## 2018-12-03 DIAGNOSIS — I442 Atrioventricular block, complete: Secondary | ICD-10-CM

## 2018-12-03 DIAGNOSIS — I480 Paroxysmal atrial fibrillation: Secondary | ICD-10-CM

## 2018-12-03 LAB — CUP PACEART REMOTE DEVICE CHECK
Battery Impedance: 1067 Ohm
Battery Remaining Longevity: 58 mo
Battery Voltage: 2.77 V
Brady Statistic AP VP Percent: 1 %
Brady Statistic AP VS Percent: 0 %
Brady Statistic AS VP Percent: 99 %
Brady Statistic AS VS Percent: 0 %
Date Time Interrogation Session: 20200722121940
Implantable Lead Implant Date: 19980518
Implantable Lead Implant Date: 20040301
Implantable Lead Location: 753859
Implantable Lead Location: 753860
Implantable Lead Model: 4269
Implantable Lead Model: 4285
Implantable Lead Serial Number: 249440
Implantable Lead Serial Number: 295540
Implantable Pulse Generator Implant Date: 20111206
Lead Channel Impedance Value: 557 Ohm
Lead Channel Impedance Value: 803 Ohm
Lead Channel Pacing Threshold Amplitude: 1.75 V
Lead Channel Pacing Threshold Pulse Width: 0.4 ms
Lead Channel Setting Pacing Amplitude: 2 V
Lead Channel Setting Pacing Amplitude: 2.5 V
Lead Channel Setting Pacing Pulse Width: 0.4 ms
Lead Channel Setting Sensing Sensitivity: 5.6 mV

## 2018-12-04 DIAGNOSIS — I4891 Unspecified atrial fibrillation: Secondary | ICD-10-CM | POA: Diagnosis not present

## 2018-12-04 DIAGNOSIS — Z5181 Encounter for therapeutic drug level monitoring: Secondary | ICD-10-CM | POA: Diagnosis not present

## 2018-12-04 DIAGNOSIS — M6281 Muscle weakness (generalized): Secondary | ICD-10-CM | POA: Diagnosis not present

## 2018-12-04 DIAGNOSIS — N3946 Mixed incontinence: Secondary | ICD-10-CM | POA: Diagnosis not present

## 2018-12-04 DIAGNOSIS — R2681 Unsteadiness on feet: Secondary | ICD-10-CM | POA: Diagnosis not present

## 2018-12-04 DIAGNOSIS — R2689 Other abnormalities of gait and mobility: Secondary | ICD-10-CM | POA: Diagnosis not present

## 2018-12-04 DIAGNOSIS — R41841 Cognitive communication deficit: Secondary | ICD-10-CM | POA: Diagnosis not present

## 2018-12-04 LAB — PROTIME-INR: INR: 1.1 (ref 0.9–1.1)

## 2018-12-05 ENCOUNTER — Ambulatory Visit (INDEPENDENT_AMBULATORY_CARE_PROVIDER_SITE_OTHER): Payer: Medicare Other | Admitting: Cardiovascular Disease

## 2018-12-05 ENCOUNTER — Telehealth: Payer: Self-pay | Admitting: *Deleted

## 2018-12-05 DIAGNOSIS — I480 Paroxysmal atrial fibrillation: Secondary | ICD-10-CM | POA: Diagnosis not present

## 2018-12-05 DIAGNOSIS — Z5181 Encounter for therapeutic drug level monitoring: Secondary | ICD-10-CM | POA: Diagnosis not present

## 2018-12-05 NOTE — Telephone Encounter (Signed)
[place] because the pt is overdue for her INR results that they draw and fax to [place], since we have not received them. Had to leave a message on Robyn's voicemail with our contact information, will await a call back.

## 2018-12-05 NOTE — Telephone Encounter (Signed)
Addressed in separate anticoag encounter from 7/24.

## 2018-12-08 DIAGNOSIS — R2681 Unsteadiness on feet: Secondary | ICD-10-CM | POA: Diagnosis not present

## 2018-12-08 DIAGNOSIS — R41841 Cognitive communication deficit: Secondary | ICD-10-CM | POA: Diagnosis not present

## 2018-12-08 DIAGNOSIS — M6281 Muscle weakness (generalized): Secondary | ICD-10-CM | POA: Diagnosis not present

## 2018-12-08 DIAGNOSIS — R2689 Other abnormalities of gait and mobility: Secondary | ICD-10-CM | POA: Diagnosis not present

## 2018-12-08 DIAGNOSIS — N3946 Mixed incontinence: Secondary | ICD-10-CM | POA: Diagnosis not present

## 2018-12-09 DIAGNOSIS — M6281 Muscle weakness (generalized): Secondary | ICD-10-CM | POA: Diagnosis not present

## 2018-12-09 DIAGNOSIS — R2689 Other abnormalities of gait and mobility: Secondary | ICD-10-CM | POA: Diagnosis not present

## 2018-12-09 DIAGNOSIS — N3946 Mixed incontinence: Secondary | ICD-10-CM | POA: Diagnosis not present

## 2018-12-09 DIAGNOSIS — R2681 Unsteadiness on feet: Secondary | ICD-10-CM | POA: Diagnosis not present

## 2018-12-09 DIAGNOSIS — R41841 Cognitive communication deficit: Secondary | ICD-10-CM | POA: Diagnosis not present

## 2018-12-11 ENCOUNTER — Ambulatory Visit (INDEPENDENT_AMBULATORY_CARE_PROVIDER_SITE_OTHER): Payer: Medicare Other | Admitting: Cardiology

## 2018-12-11 ENCOUNTER — Other Ambulatory Visit: Payer: Self-pay | Admitting: Internal Medicine

## 2018-12-11 DIAGNOSIS — I480 Paroxysmal atrial fibrillation: Secondary | ICD-10-CM

## 2018-12-11 DIAGNOSIS — I4891 Unspecified atrial fibrillation: Secondary | ICD-10-CM | POA: Diagnosis not present

## 2018-12-11 DIAGNOSIS — Z5181 Encounter for therapeutic drug level monitoring: Secondary | ICD-10-CM

## 2018-12-11 LAB — PROTIME-INR
INR: 2 — ABNORMAL HIGH
Prothrombin Time: 19.7 s — ABNORMAL HIGH (ref 9.0–11.5)

## 2018-12-12 DIAGNOSIS — N3946 Mixed incontinence: Secondary | ICD-10-CM | POA: Diagnosis not present

## 2018-12-12 DIAGNOSIS — R2681 Unsteadiness on feet: Secondary | ICD-10-CM | POA: Diagnosis not present

## 2018-12-12 DIAGNOSIS — R2689 Other abnormalities of gait and mobility: Secondary | ICD-10-CM | POA: Diagnosis not present

## 2018-12-12 DIAGNOSIS — M6281 Muscle weakness (generalized): Secondary | ICD-10-CM | POA: Diagnosis not present

## 2018-12-12 DIAGNOSIS — R41841 Cognitive communication deficit: Secondary | ICD-10-CM | POA: Diagnosis not present

## 2018-12-16 ENCOUNTER — Other Ambulatory Visit: Payer: Self-pay | Admitting: *Deleted

## 2018-12-16 MED ORDER — OMEPRAZOLE 20 MG PO CPDR: 20.0000 mg | DELAYED_RELEASE_CAPSULE | Freq: Two times a day (BID) | ORAL | 1 refills | Status: DC

## 2018-12-16 NOTE — Telephone Encounter (Signed)
[facility] 

## 2018-12-17 NOTE — Progress Notes (Signed)
Remote pacemaker transmission.   

## 2018-12-18 ENCOUNTER — Other Ambulatory Visit: Payer: Self-pay | Admitting: Internal Medicine

## 2018-12-18 ENCOUNTER — Ambulatory Visit (INDEPENDENT_AMBULATORY_CARE_PROVIDER_SITE_OTHER): Payer: Medicare Other | Admitting: Cardiovascular Disease

## 2018-12-18 DIAGNOSIS — N3946 Mixed incontinence: Secondary | ICD-10-CM | POA: Diagnosis not present

## 2018-12-18 DIAGNOSIS — I4891 Unspecified atrial fibrillation: Secondary | ICD-10-CM | POA: Diagnosis not present

## 2018-12-18 DIAGNOSIS — M6281 Muscle weakness (generalized): Secondary | ICD-10-CM | POA: Diagnosis not present

## 2018-12-18 DIAGNOSIS — R260 Ataxic gait: Secondary | ICD-10-CM | POA: Diagnosis not present

## 2018-12-18 DIAGNOSIS — I480 Paroxysmal atrial fibrillation: Secondary | ICD-10-CM | POA: Diagnosis not present

## 2018-12-18 DIAGNOSIS — R2681 Unsteadiness on feet: Secondary | ICD-10-CM | POA: Diagnosis not present

## 2018-12-18 DIAGNOSIS — R2689 Other abnormalities of gait and mobility: Secondary | ICD-10-CM | POA: Diagnosis not present

## 2018-12-18 DIAGNOSIS — Z5181 Encounter for therapeutic drug level monitoring: Secondary | ICD-10-CM

## 2018-12-18 LAB — PROTIME-INR
INR: 2.1 — AB (ref <=1.1)
INR: 2.1 — ABNORMAL HIGH
Prothrombin Time: 20.9 s — ABNORMAL HIGH (ref 9.0–11.5)

## 2018-12-18 NOTE — Patient Instructions (Signed)
Description   Spoke with pt's husband and instructed him to have pt continue taking Coumadin 1 tablet daily except 1.5 tablets on Thursdays. Recheck in 2 weeks.  Orders faxed to [facility].

## 2018-12-23 ENCOUNTER — Other Ambulatory Visit: Payer: Self-pay | Admitting: Internal Medicine

## 2019-01-01 ENCOUNTER — Other Ambulatory Visit: Payer: Self-pay | Admitting: Internal Medicine

## 2019-01-01 DIAGNOSIS — I4821 Permanent atrial fibrillation: Secondary | ICD-10-CM | POA: Diagnosis not present

## 2019-01-01 LAB — PROTIME-INR
INR: 2.2 — ABNORMAL HIGH
Prothrombin Time: 21.7 s — ABNORMAL HIGH (ref 9.0–11.5)

## 2019-01-02 ENCOUNTER — Ambulatory Visit (INDEPENDENT_AMBULATORY_CARE_PROVIDER_SITE_OTHER): Payer: Medicare Other | Admitting: *Deleted

## 2019-01-02 DIAGNOSIS — Z5181 Encounter for therapeutic drug level monitoring: Secondary | ICD-10-CM

## 2019-01-02 DIAGNOSIS — I480 Paroxysmal atrial fibrillation: Secondary | ICD-10-CM

## 2019-01-05 NOTE — Patient Instructions (Signed)
Description   Spoke with pt's husband and instructed him to have pt continue taking Coumadin 1 tablet daily except 1.5 tablets on Thursdays. Recheck in 4 weeks. Orders faxed to [facility].

## 2019-01-22 ENCOUNTER — Other Ambulatory Visit: Payer: Medicare Other

## 2019-01-22 ENCOUNTER — Other Ambulatory Visit: Payer: Self-pay

## 2019-01-22 DIAGNOSIS — D1801 Hemangioma of skin and subcutaneous tissue: Secondary | ICD-10-CM | POA: Diagnosis not present

## 2019-01-22 DIAGNOSIS — L570 Actinic keratosis: Secondary | ICD-10-CM | POA: Diagnosis not present

## 2019-01-22 DIAGNOSIS — E782 Mixed hyperlipidemia: Secondary | ICD-10-CM

## 2019-01-22 DIAGNOSIS — I480 Paroxysmal atrial fibrillation: Secondary | ICD-10-CM | POA: Diagnosis not present

## 2019-01-22 DIAGNOSIS — L821 Other seborrheic keratosis: Secondary | ICD-10-CM | POA: Diagnosis not present

## 2019-01-22 DIAGNOSIS — E039 Hypothyroidism, unspecified: Secondary | ICD-10-CM | POA: Diagnosis not present

## 2019-01-22 DIAGNOSIS — L820 Inflamed seborrheic keratosis: Secondary | ICD-10-CM | POA: Diagnosis not present

## 2019-01-22 LAB — LIPID PANEL
Cholesterol: 157 mg/dL (ref <200)
HDL: 59 mg/dL (ref >=50)
LDL Cholesterol (Calc): 76 mg/dL (calc)
Non-HDL Cholesterol (Calc): 98 mg/dL (calc) (ref <130)
Total CHOL/HDL Ratio: 2.7 (calc) (ref <5.0)
Triglycerides: 136 mg/dL (ref <150)

## 2019-01-22 LAB — COMPLETE METABOLIC PANEL WITH GFR
AG Ratio: 2 (calc) (ref 1.0–2.5)
ALT: 15 U/L (ref 6–29)
AST: 17 U/L (ref 10–35)
Albumin: 4.1 g/dL (ref 3.6–5.1)
Alkaline phosphatase (APISO): 70 U/L (ref 37–153)
BUN: 19 mg/dL (ref 7–25)
CO2: 29 mmol/L (ref 20–32)
Calcium: 9.5 mg/dL (ref 8.6–10.4)
Chloride: 105 mmol/L (ref 98–110)
Creat: 0.76 mg/dL (ref 0.60–0.88)
GFR, Est African American: 78 mL/min/{1.73_m2} (ref >=60)
GFR, Est Non African American: 68 mL/min/{1.73_m2} (ref >=60)
Globulin: 2.1 g/dL (calc) (ref 1.9–3.7)
Glucose, Bld: 90 mg/dL (ref 65–99)
Potassium: 4.2 mmol/L (ref 3.5–5.3)
Sodium: 141 mmol/L (ref 135–146)
Total Bilirubin: 0.4 mg/dL (ref 0.2–1.2)
Total Protein: 6.2 g/dL (ref 6.1–8.1)

## 2019-01-22 LAB — CBC WITH DIFFERENTIAL/PLATELET
Absolute Monocytes: 329 cells/uL (ref 200–950)
Basophils Absolute: 69 cells/uL (ref 0–200)
Basophils Relative: 1.3 %
Eosinophils Absolute: 80 cells/uL (ref 15–500)
Eosinophils Relative: 1.5 %
HCT: 40.2 % (ref 35.0–45.0)
Hemoglobin: 12.9 g/dL (ref 11.7–15.5)
Lymphs Abs: 1950 cells/uL (ref 850–3900)
MCH: 29.6 pg (ref 27.0–33.0)
MCHC: 32.1 g/dL (ref 32.0–36.0)
MCV: 92.2 fL (ref 80.0–100.0)
MPV: 11.9 fL (ref 7.5–12.5)
Monocytes Relative: 6.2 %
Neutro Abs: 2873 cells/uL (ref 1500–7800)
Neutrophils Relative %: 54.2 %
Platelets: 219 10*3/uL (ref 140–400)
RBC: 4.36 10*6/uL (ref 3.80–5.10)
RDW: 13.8 % (ref 11.0–15.0)
Total Lymphocyte: 36.8 %
WBC: 5.3 10*3/uL (ref 3.8–10.8)

## 2019-01-22 LAB — TSH: TSH: 2.32 mIU/L (ref 0.40–4.50)

## 2019-01-28 ENCOUNTER — Other Ambulatory Visit: Payer: Self-pay

## 2019-01-28 ENCOUNTER — Encounter: Payer: Self-pay | Admitting: Internal Medicine

## 2019-01-28 ENCOUNTER — Non-Acute Institutional Stay: Payer: Medicare Other | Admitting: Internal Medicine

## 2019-01-28 VITALS — BP 130/64 | HR 83 | Temp 97.4°F | Resp 18 | Ht 61.0 in | Wt 130.8 lb

## 2019-01-28 DIAGNOSIS — Z86718 Personal history of other venous thrombosis and embolism: Secondary | ICD-10-CM | POA: Diagnosis not present

## 2019-01-28 DIAGNOSIS — N3946 Mixed incontinence: Secondary | ICD-10-CM | POA: Diagnosis not present

## 2019-01-28 DIAGNOSIS — R4189 Other symptoms and signs involving cognitive functions and awareness: Secondary | ICD-10-CM

## 2019-01-28 DIAGNOSIS — R197 Diarrhea, unspecified: Secondary | ICD-10-CM

## 2019-01-28 DIAGNOSIS — R259 Unspecified abnormal involuntary movements: Secondary | ICD-10-CM | POA: Diagnosis not present

## 2019-01-28 DIAGNOSIS — I480 Paroxysmal atrial fibrillation: Secondary | ICD-10-CM | POA: Diagnosis not present

## 2019-01-28 DIAGNOSIS — E782 Mixed hyperlipidemia: Secondary | ICD-10-CM | POA: Diagnosis not present

## 2019-01-28 NOTE — Progress Notes (Addendum)
Location:  [place] of Service:  Clinic (12)  Provider:   Code Status:  Goals of Care:  Advanced Directives 10/15/2018  Does Patient Have a Medical Advance Directive? No;Yes  Type of Advance Directive [place]  Does patient want to make changes to medical advance directive? -  Copy of [place] in Chart? Yes - validated most recent copy scanned in chart (See row information)  Would patient like information on creating a medical advance directive? No - Patient declined     Chief Complaint  Patient presents with  . Medical Management of Chronic Issues    3 MO F/U W/LABS    HPI: Patient is a 85 y.o. female seen today for medical management of chronic diseases.   PAF On Coumadin follows with Cardiology Loose stools Continues to be have problems due to IBS Had seen GI.  They think it is because of pelvic muscle laxity Not Candidate for Colonoscopy  Urinary Incontinence Continues to have both stress and urge incontinence needing to wear pads  Cognitive impairment Patient managing good with her ADLs at this time [facility] with a walker.  Has not had any recent falls Myoclonic jerks Recently her jerks have been worse in the morning her husband decided to cut back on sertraline which has made her jerks worse.  She was having some jerking movement today.  She has seen a neurologist in [place].  They have planning to follow-up with him  Osteoporosis Follows with rheumatology  and gets Prolia every 6 months  Past Medical History:  Diagnosis Date  . Arthritis   . Atrial fibrillation ([place])   . Cataract    removed bilateral  . Clotting disorder ([place])    h/o blood clot left leg  . Dementia ([place])   . Depression   . DVT (deep venous thrombosis) ([place]) 2011  . GERD (gastroesophageal reflux disease)   . HB (heart block)    s/p PPM, most recent generator change 12/11 by JA  . Heart murmur   . Hiatal hernia   . Hyperlipidemia    . Hypertension   . Hypothyroidism   . Osteoporosis   . PTE (post-transplant erythrocytosis)   . Pulmonary embolism ([place]) 2011   on coumadin  . UTI (lower urinary tract infection)     Past Surgical History:  Procedure Laterality Date  . BLADDER SURGERY     twice  . cataracts     bilateral  . CHOLECYSTECTOMY  09/2010  . COLONOSCOPY    . ESOPHAGOGASTRODUODENOSCOPY    . HEMORRHOID SURGERY    . hysterectomy (other)    . INNER EAR SURGERY    . JOINT REPLACEMENT    . pacemaker     most recent generator 12/11 by JA  . TOTAL KNEE ARTHROPLASTY Left     Allergies  Allergen Reactions  . Alendronate Other (See Comments)    Can not tolerate  . Celebrex [Celecoxib] Other (See Comments)    Can not tolerate  . Meloxicam Other (See Comments)    Avoid   . Myrbetriq [Mirabegron] Other (See Comments)    Can not tolerate  . Norco [Hydrocodone-Acetaminophen] Other (See Comments)    Dizziness and jerking  . Pradaxa [Dabigatran Etexilate Mesylate] Other (See Comments)    Can not tolerate  . Pradaxa [Dabigatran]   . Toviaz [Fesoterodine Fumarate Er] Other (See Comments)    Can not tolerate  . Vesicare [Solifenacin] Other (See Comments)  Can not tolerate    Outpatient Encounter Medications as of 01/28/2019  Medication Sig  . Benfotiamine 150 MG CAPS Take 150 mg by mouth daily.  . Calcium Carb-Cholecalciferol (CALCIUM 600+D3) 600-800 MG-UNIT TABS Take 1 tablet by mouth daily.  . cholecalciferol (VITAMIN D) 1000 UNITS tablet Take 1,000 Units by mouth daily.   [name] Dextromethorphan-Guaifenesin (MUCUS RELIEF DM PO) Take 1 tablet by mouth daily.  [name] donepezil (ARICEPT) 5 MG tablet TAKE 1 TABLET AT BEDTIME  . gabapentin (NEURONTIN) 300 MG capsule Take 1 capsule (300 mg total) by mouth 2 (two) times daily.  [name] levothyroxine (SYNTHROID, LEVOTHROID) 50 MCG tablet Take 1 tablet (50 mcg total) by mouth daily before breakfast.  . loratadine (CLARITIN) 10 MG tablet Take 10 mg by mouth daily.   .  memantine (NAMENDA) 10 MG tablet Take 5 mg by mouth 2 (two) times daily.   . Multiple Vitamins-Minerals (PRESERVISION AREDS 2 PO) Take 1 capsule by mouth 2 (two) times daily.  [name] NITROSTAT 0.4 MG SL tablet Place 0.4 mg under the tongue every 5 (five) minutes as needed for chest pain (MAX 3 TABLETS).   [name] omeprazole (PRILOSEC) 20 MG capsule Take 1 capsule (20 mg total) by mouth 2 (two) times daily.  . pravastatin (PRAVACHOL) 20 MG tablet TAKE 1 TABLET EVERY DAY  . Probiotic Product (PROBIOTIC DAILY PO) Take 1 capsule by mouth daily.  . sertraline (ZOLOFT) 25 MG tablet Take 3 tablets (75 mg total) by mouth daily. 25 mg in the mornings and 50 mg in the evenings (Patient taking differently: Take 75 mg by mouth daily. 50 mg in the evenings)  . warfarin (COUMADIN) 5 MG tablet TAKE 1 TABLET DAILY OR AS DIRECTED BY THE COUMADIN CLINIC.  . [DISCONTINUED] Sennosides-Docusate Sodium (SENNA-S PO) Take 1 tablet by mouth as needed.    No facility-administered encounter medications on file as of 01/28/2019.     Review of Systems:  Review of Systems  AS per HPI Rest all systems are negative  Health Maintenance  Topic Date Due  . DEXA SCAN  04/29/1990  . INFLUENZA VACCINE  12/13/2018  . TETANUS/TDAP  02/11/2022  . PNA vac Low Risk Adult  Completed    Physical Exam: Vitals:   01/28/19 1414  BP: 130/64  Pulse: 83  Resp: 18  Temp: (!) 97.4 F (36.3 C)  SpO2: 99%  Weight: 130 lb 12.8 oz (59.3 kg)  Height: 5\' 1"  (1.549 m)   Body mass index is 24.71 kg/m. Physical Exam  Constitutional: Oriented to person, place, and time. Well-developed and well-nourished.  HENT:  Head: Normocephalic.  Mouth/Throat: Oropharynx is clear and moist.  Eyes: Pupils are equal, round, and reactive to light.  Neck: Neck supple.  Cardiovascular: Normal rate and normal heart sounds.  Positive for  murmur heard. Pulmonary/Chest: Effort normal and breath sounds normal. No respiratory distress. No wheezes. She has no  rales.  Abdominal: Soft. Bowel sounds are normal. No distension. There is no tenderness. There is no rebound.  Musculoskeletal: Mild edema Bilateral Lymphadenopathy: none Neurological: Alert and oriented to person, place, and time. But repeats herself all the time. Walks with the walker. Has steady Gait Skin: Skin is warm and dry.  Psychiatric: Normal mood and affect. Behavior is normal. Thought content normal.    Labs reviewed: Basic Metabolic Panel: Recent Labs    03/12/18 0000 06/16/18 0810 07/25/18 1556 01/22/19 0745  NA  --  146  --  141  K  --  4.4  --  4.2  CL  --  108  --  105  CO2  --  29  --  29  GLUCOSE  --  99  --  90  BUN  --  18 20 19   CREATININE  --  0.73 0.85 0.76  CALCIUM  --  9.7  --  9.5  TSH 3.37  --   --  2.32   Liver Function Tests: Recent Labs    06/16/18 0810 01/22/19 0745  AST 15 17  ALT 10 15  BILITOT 0.3 0.4  PROT 6.2 6.2   Recent Labs    06/16/18 0810  LIPASE 36   No results for input(s): AMMONIA in the last 8760 hours. CBC: Recent Labs    06/16/18 0810 01/22/19 0745  WBC 6.7 5.3  NEUTROABS 3,966 2,873  HGB 12.6 12.9  HCT 38.5 40.2  MCV 87.7 92.2  PLT 232 219   Lipid Panel: Recent Labs    03/12/18 0000 01/22/19 0745  CHOL 175 157  HDL 55 59  LDLCALC 93 76  TRIG 169* 136  CHOLHDL 3.2 2.7   No results found for: HGBA1C  Procedures since last visit: No results found.  Assessment/Plan Paroxysmal atrial fibrillation (HCC) Rate Control On Coumadin  Involuntary movements/Myoclonic jerks [facility] She has h/o Myoclonic Jerks. He has tried different regimes.And now has her on Zoloft and Neurontin.He does not think she has NeuroDegenerative disorder and think it is functional She has seen worsening recently especially in the morning. Her husband decided to reduce the dose of sertraline and now she has more jerking I have told him to increased the dose and follow up with  Neurologist  Mixed hyperlipidemia On Pravachol  History of DVT (deep vein thrombosis) On Coumadin Follows with Clinic  Diarrhea  GI pathogen negative Was seen by GI and they think it is due to  Pelvic floor Laxity  CT scan also showed Gastric Polyp But EGD was negative Also Recommended Pelvic Muscle exercise Told patient to keep doing Exercise  Cognitive impairment MMSE today was 27/30 Failed her Clock Drawing Recall was 3/3 Continue Namenda and Aricept  Osteoporosis Follows with Rheumatology Gets Prolia Q 6 months  Acquired hypothyroidism - Continue Supplement  Mixed incontinence urge and stress Intolerant to Myrebetiq Offered to get Urology Consult They want to wait  Labs/tests ordered:  * No order type specified * Next appt:  05/21/2019  Total time spent in this patient care encounter was  45_  minutes; greater than 50% of the visit spent counseling patient and staff, reviewing records , Labs and coordinating care for problems addressed at this encounter.

## 2019-01-29 ENCOUNTER — Ambulatory Visit (INDEPENDENT_AMBULATORY_CARE_PROVIDER_SITE_OTHER): Payer: Medicare Other | Admitting: Pharmacist

## 2019-01-29 ENCOUNTER — Other Ambulatory Visit: Payer: Self-pay | Admitting: Internal Medicine

## 2019-01-29 DIAGNOSIS — I4891 Unspecified atrial fibrillation: Secondary | ICD-10-CM | POA: Diagnosis not present

## 2019-01-29 DIAGNOSIS — Z5181 Encounter for therapeutic drug level monitoring: Secondary | ICD-10-CM | POA: Diagnosis not present

## 2019-01-29 DIAGNOSIS — I480 Paroxysmal atrial fibrillation: Secondary | ICD-10-CM

## 2019-01-29 LAB — PROTIME-INR
INR: 2 — ABNORMAL HIGH
Prothrombin Time: 20 s — ABNORMAL HIGH (ref 9.0–11.5)

## 2019-01-29 LAB — POCT INR: INR: 2 (ref 2.0–3.0)

## 2019-01-30 DIAGNOSIS — I480 Paroxysmal atrial fibrillation: Secondary | ICD-10-CM

## 2019-01-30 DIAGNOSIS — Z5181 Encounter for therapeutic drug level monitoring: Secondary | ICD-10-CM

## 2019-01-30 NOTE — Progress Notes (Signed)
This encounter was created in error - please disregard.

## 2019-02-16 ENCOUNTER — Telehealth: Payer: Self-pay

## 2019-02-18 ENCOUNTER — Other Ambulatory Visit: Payer: Self-pay | Admitting: Internal Medicine

## 2019-02-18 ENCOUNTER — Telehealth: Payer: Self-pay

## 2019-02-18 MED ORDER — MEMANTINE HCL 10 MG PO TABS: 5.0000 mg | ORAL_TABLET | Freq: Two times a day (BID) | ORAL | 3 refills | Status: DC

## 2019-02-18 MED ORDER — DONEPEZIL HCL 5 MG PO TABS: 5.0000 mg | ORAL_TABLET | Freq: Every day | ORAL | 0 refills | Status: DC

## 2019-02-18 MED ORDER — GABAPENTIN 100 MG PO CAPS: 100.0000 mg | ORAL_CAPSULE | Freq: Every day | ORAL | 0 refills | Status: DC | PRN

## 2019-02-18 MED ORDER — GABAPENTIN 300 MG PO CAPS: 300.0000 mg | ORAL_CAPSULE | Freq: Two times a day (BID) | ORAL | 3 refills | Status: DC

## 2019-02-18 MED ORDER — OMEPRAZOLE 20 MG PO CPDR: 20.0000 mg | DELAYED_RELEASE_CAPSULE | Freq: Two times a day (BID) | ORAL | 1 refills | Status: DC

## 2019-02-18 MED ORDER — PRAVASTATIN SODIUM 20 MG PO TABS: 20.0000 mg | ORAL_TABLET | Freq: Every day | ORAL | 0 refills | Status: DC

## 2019-02-18 MED ORDER — SERTRALINE HCL 25 MG PO TABS: 75.0000 mg | ORAL_TABLET | Freq: Every day | ORAL | 1 refills | Status: DC

## 2019-02-18 NOTE — Telephone Encounter (Signed)
Her TSH has been Normal. We have been checking it here in our clinic. I have d/w the Husband that she needs to continue taking same Meds for now.I have reordered all her meds under my name so that they don't have to go to [facility] for follow up.

## 2019-02-18 NOTE — Telephone Encounter (Signed)
Patient's husband states [name]'s thyroid med was ordered and approved yesterday from [facility] medical but they want a blood test on the 21st. She hasn't been there in a while and want to know is thyroid numbers ok?

## 2019-02-19 NOTE — Telephone Encounter (Signed)
Spoke with pt regarding appt on 02/20/19. Pt stated she would like Dr Rayann Heman to call her on the phone for her appt. Pt was advise to check her vitals prior to her appt. Pt questions and concerns were address

## 2019-02-20 ENCOUNTER — Telehealth: Payer: Self-pay | Admitting: *Deleted

## 2019-02-20 ENCOUNTER — Other Ambulatory Visit: Payer: Self-pay

## 2019-02-20 ENCOUNTER — Telehealth (INDEPENDENT_AMBULATORY_CARE_PROVIDER_SITE_OTHER): Payer: Medicare Other | Admitting: Internal Medicine

## 2019-02-20 DIAGNOSIS — I442 Atrioventricular block, complete: Secondary | ICD-10-CM | POA: Diagnosis not present

## 2019-02-20 DIAGNOSIS — I480 Paroxysmal atrial fibrillation: Secondary | ICD-10-CM

## 2019-02-20 MED ORDER — GABAPENTIN 300 MG PO CAPS: 300.0000 mg | ORAL_CAPSULE | Freq: Every day | ORAL | 1 refills | Status: DC

## 2019-02-20 MED ORDER — GABAPENTIN 100 MG PO CAPS: 100.0000 mg | ORAL_CAPSULE | Freq: Every day | ORAL | 1 refills | Status: DC | PRN

## 2019-02-20 NOTE — Telephone Encounter (Signed)
Refaxed Rx with note to [facility]

## 2019-02-20 NOTE — Telephone Encounter (Signed)
She takes 300 mg QHS and takes 100mg  PRN qd for her Jerking. So yes she needs both filled up

## 2019-02-20 NOTE — Telephone Encounter (Signed)
Received fax from [facility] for Medication Clarification. Stated that they received 2 Rxs written same date for Gabapentin 100mg  and Gabapentin 300mg . Please clarify current therapy.

## 2019-02-20 NOTE — Addendum Note (Signed)
Addended by: Rafael Bihari A on: 02/20/2019 02:31 PM   Modules accepted: Orders

## 2019-02-20 NOTE — Progress Notes (Signed)
Electrophysiology TeleHealth Note  Due to national recommendations of social distancing due to [place] 19, an audio telehealth visit is felt to be most appropriate for this patient at this time.  Verbal consent was obtained by me for the telehealth visit today.  The patient does not have capability for a virtual visit.  A phone visit is therefore required today.   Date:  02/20/2019   ID:  [name], DOB 12-Sep-1922, MRN [id number]  Location: patient's home  Provider location:  [facility]  Evaluation Performed: Follow-up visit  PCP:  [name], MD   Electrophysiologist:  Dr Rayann Heman  Chief Complaint:  palpitations  History of Present Illness:    [name] is a 85 y.o. female who presents via telehealth conferencing today.  Since last being seen in our clinic, the patient reports doing very well.  Today, she denies symptoms of palpitations, chest pain, shortness of breath,  lower extremity edema, presyncope, or syncope.  + chronic dizziness is stable.  She continues to have chronic back pain. The patient is otherwise without complaint today.  The patient denies symptoms of fevers, chills, cough, or new SOB worrisome for COVID 19.  Past Medical History:  Diagnosis Date  . Arthritis   . Atrial fibrillation ([place])   . Cataract    removed bilateral  . Clotting disorder ([place])    h/o blood clot left leg  . Dementia ([place])   . Depression   . DVT (deep venous thrombosis) ([place]) 2011  . GERD (gastroesophageal reflux disease)   . HB (heart block)    s/p PPM, most recent generator change 12/11 by JA  . Heart murmur   . Hiatal hernia   . Hyperlipidemia   . Hypertension   . Hypothyroidism   . Osteoporosis   . PTE (post-transplant erythrocytosis)   . Pulmonary embolism ([place]) 2011   on coumadin  . UTI (lower urinary tract infection)     Past Surgical History:  Procedure Laterality Date  . BLADDER SURGERY     twice  . cataracts     bilateral  .  CHOLECYSTECTOMY  09/2010  . COLONOSCOPY    . ESOPHAGOGASTRODUODENOSCOPY    . HEMORRHOID SURGERY    . hysterectomy (other)    . INNER EAR SURGERY    . JOINT REPLACEMENT    . pacemaker     most recent generator 12/11 by JA  . TOTAL KNEE ARTHROPLASTY Left     Current Outpatient Medications  Medication Sig Dispense Refill  . Benfotiamine 150 MG CAPS Take 150 mg by mouth daily.    . Calcium Carb-Cholecalciferol (CALCIUM 600+D3) 600-800 MG-UNIT TABS Take 1 tablet by mouth daily.    . cholecalciferol (VITAMIN D) 1000 UNITS tablet Take 1,000 Units by mouth daily.     [name] Dextromethorphan-Guaifenesin (MUCUS RELIEF DM PO) Take 1 tablet by mouth daily.    [name] donepezil (ARICEPT) 5 MG tablet Take 1 tablet (5 mg total) by mouth at bedtime. 90 tablet 0  . gabapentin (NEURONTIN) 100 MG capsule Take 1 capsule (100 mg total) by mouth daily as needed. 90 capsule 1  . gabapentin (NEURONTIN) 300 MG capsule Take 1 capsule (300 mg total) by mouth at bedtime. 180 capsule 1  . levothyroxine (SYNTHROID, LEVOTHROID) 50 MCG tablet Take 1 tablet (50 mcg total) by mouth daily before breakfast. 90 tablet 1  . loratadine (CLARITIN) 10 MG tablet Take 10 mg by mouth daily.     . memantine (NAMENDA)  10 MG tablet Take 0.5 tablets (5 mg total) by mouth 2 (two) times daily. 60 tablet 3  . Multiple Vitamins-Minerals (PRESERVISION AREDS 2 PO) Take 1 capsule by mouth 2 (two) times daily.    [name] NITROSTAT 0.4 MG SL tablet Place 0.4 mg under the tongue every 5 (five) minutes as needed for chest pain (MAX 3 TABLETS).     [name] omeprazole (PRILOSEC) 20 MG capsule Take 1 capsule (20 mg total) by mouth 2 (two) times daily. 180 capsule 1  . pravastatin (PRAVACHOL) 20 MG tablet Take 1 tablet (20 mg total) by mouth daily. 90 tablet 0  . Probiotic Product (PROBIOTIC DAILY PO) Take 1 capsule by mouth daily.    . sertraline (ZOLOFT) 25 MG tablet Take 3 tablets (75 mg total) by mouth daily. 25 mg in the mornings and 50 mg in the evenings 270 tablet 1   . warfarin (COUMADIN) 5 MG tablet TAKE 1 TABLET DAILY OR AS DIRECTED BY THE COUMADIN CLINIC. 100 tablet 1   No current facility-administered medications for this visit.     Allergies:   Alendronate, Celebrex [celecoxib], Meloxicam, Myrbetriq [mirabegron], Norco [hydrocodone-acetaminophen], Pradaxa [dabigatran etexilate mesylate], Pradaxa [dabigatran], Toviaz [fesoterodine fumarate er], and Vesicare [solifenacin]   Social History:  The patient  reports that she has never smoked. She has never used smokeless tobacco. She reports that she does not drink alcohol or use drugs.   Family History:  The patient's family history includes Arthritis in her father and mother; Breast cancer in her sister; Heart disease in her father and mother; Liver cancer in her brother; Stomach cancer in her brother and brother; Stroke in her father.   ROS:  Please see the history of present illness.   All other systems are personally reviewed and negative.    Exam:    Vital Signs:  There were no vitals taken for this visit.  Well sounding, alert and conversant   Labs/Other Tests and Data Reviewed:    Recent Labs: 01/22/2019: ALT 15; BUN 19; Creat 0.76; Hemoglobin 12.9; Platelets 219; Potassium 4.2; Sodium 141; TSH 2.32   Wt Readings from Last 3 Encounters:  01/28/19 130 lb 12.8 oz (59.3 kg)  10/20/18 134 lb (60.8 kg)  10/15/18 130 lb 3.2 oz (59.1 kg)     Last device remote is reviewed from [facility] PDF which reveals normal device function, afib burden 0.7   ASSESSMENT & PLAN:    1.  Complete heart block Remotes are uptodate Normal device function, occasional atrial noise  2. Paroxysmal atrial fibrillation afib burden 0.7% On coumadin  3. Prior PTE On coumadin  4. Moderate AS Given advanced age, conservative management is advised  Follow-up:  Return to see EP NP in a year   Patient Risk:  after full review of this patients clinical status, I feel that they are at moderate risk at this time.   Today, I have spent 15 minutes with the patient with telehealth technology discussing arrhythmia management .    SignedThompson Grayer, MD  02/20/2019 3:57 PM     [place] [address] [place] [address] [phone] (office) [phone] (fax)

## 2019-02-24 DIAGNOSIS — L570 Actinic keratosis: Secondary | ICD-10-CM | POA: Diagnosis not present

## 2019-02-24 DIAGNOSIS — L820 Inflamed seborrheic keratosis: Secondary | ICD-10-CM | POA: Diagnosis not present

## 2019-02-24 DIAGNOSIS — L821 Other seborrheic keratosis: Secondary | ICD-10-CM | POA: Diagnosis not present

## 2019-02-24 DIAGNOSIS — D1801 Hemangioma of skin and subcutaneous tissue: Secondary | ICD-10-CM | POA: Diagnosis not present

## 2019-02-25 ENCOUNTER — Telehealth: Payer: Self-pay

## 2019-02-25 NOTE — Telephone Encounter (Signed)
I called and spoke Ronelle Nigh Pharmacist  about a request they sent to verify how patient is taking Gabapentin  I informed him that the last  refill request sent on 02/20/2019 was the correct one to use not the one sent for 02/18/2019 they said corrections had been made

## 2019-02-26 ENCOUNTER — Other Ambulatory Visit: Payer: Self-pay | Admitting: Cardiovascular Disease

## 2019-02-26 ENCOUNTER — Ambulatory Visit (INDEPENDENT_AMBULATORY_CARE_PROVIDER_SITE_OTHER): Payer: Medicare Other | Admitting: Cardiology

## 2019-02-26 DIAGNOSIS — Z5181 Encounter for therapeutic drug level monitoring: Secondary | ICD-10-CM | POA: Diagnosis not present

## 2019-02-26 DIAGNOSIS — I4891 Unspecified atrial fibrillation: Secondary | ICD-10-CM | POA: Diagnosis not present

## 2019-02-26 DIAGNOSIS — I480 Paroxysmal atrial fibrillation: Secondary | ICD-10-CM | POA: Diagnosis not present

## 2019-02-26 LAB — PROTIME-INR
INR: 2.4 — ABNORMAL HIGH
Prothrombin Time: 23.6 s — ABNORMAL HIGH (ref 9.0–11.5)

## 2019-02-26 NOTE — Patient Instructions (Signed)
Description   Spoke with pt's husband and instructed him to have pt continue taking Coumadin 1 tablet daily except 1.5 tablets on Thursdays. Recheck in 4 weeks. Orders faxed to [facility].

## 2019-03-05 ENCOUNTER — Ambulatory Visit (INDEPENDENT_AMBULATORY_CARE_PROVIDER_SITE_OTHER): Payer: Medicare Other | Admitting: *Deleted

## 2019-03-05 DIAGNOSIS — M25512 Pain in left shoulder: Secondary | ICD-10-CM | POA: Diagnosis not present

## 2019-03-05 DIAGNOSIS — M1990 Unspecified osteoarthritis, unspecified site: Secondary | ICD-10-CM | POA: Diagnosis not present

## 2019-03-05 DIAGNOSIS — M7060 Trochanteric bursitis, unspecified hip: Secondary | ICD-10-CM | POA: Diagnosis not present

## 2019-03-05 DIAGNOSIS — I442 Atrioventricular block, complete: Secondary | ICD-10-CM | POA: Diagnosis not present

## 2019-03-05 DIAGNOSIS — M25559 Pain in unspecified hip: Secondary | ICD-10-CM | POA: Diagnosis not present

## 2019-03-05 DIAGNOSIS — I480 Paroxysmal atrial fibrillation: Secondary | ICD-10-CM

## 2019-03-05 DIAGNOSIS — M810 Age-related osteoporosis without current pathological fracture: Secondary | ICD-10-CM | POA: Diagnosis not present

## 2019-03-05 DIAGNOSIS — M545 Low back pain: Secondary | ICD-10-CM | POA: Diagnosis not present

## 2019-03-05 DIAGNOSIS — M7582 Other shoulder lesions, left shoulder: Secondary | ICD-10-CM | POA: Diagnosis not present

## 2019-03-07 LAB — CUP PACEART REMOTE DEVICE CHECK
Battery Impedance: 1117 Ohm
Battery Remaining Longevity: 57 mo
Battery Voltage: 2.78 V
Brady Statistic AP VP Percent: 1 %
Brady Statistic AP VS Percent: 0 %
Brady Statistic AS VP Percent: 99 %
Brady Statistic AS VS Percent: 0 %
Date Time Interrogation Session: 20201023144709
Implantable Lead Implant Date: 19980518
Implantable Lead Implant Date: 20040301
Implantable Lead Location: 753859
Implantable Lead Location: 753860
Implantable Lead Model: 4269
Implantable Lead Model: 4285
Implantable Lead Serial Number: 249440
Implantable Lead Serial Number: 295540
Implantable Pulse Generator Implant Date: 20111206
Lead Channel Impedance Value: 602 Ohm
Lead Channel Impedance Value: 877 Ohm
Lead Channel Pacing Threshold Amplitude: 1.625 V
Lead Channel Pacing Threshold Pulse Width: 0.4 ms
Lead Channel Setting Pacing Amplitude: 2 V
Lead Channel Setting Pacing Amplitude: 2.5 V
Lead Channel Setting Pacing Pulse Width: 0.4 ms
Lead Channel Setting Sensing Sensitivity: 5.6 mV

## 2019-03-17 NOTE — Progress Notes (Signed)
Remote pacemaker transmission.   

## 2019-03-24 DIAGNOSIS — L821 Other seborrheic keratosis: Secondary | ICD-10-CM | POA: Diagnosis not present

## 2019-03-24 DIAGNOSIS — L570 Actinic keratosis: Secondary | ICD-10-CM | POA: Diagnosis not present

## 2019-03-24 DIAGNOSIS — D1801 Hemangioma of skin and subcutaneous tissue: Secondary | ICD-10-CM | POA: Diagnosis not present

## 2019-03-26 ENCOUNTER — Ambulatory Visit (INDEPENDENT_AMBULATORY_CARE_PROVIDER_SITE_OTHER): Payer: Medicare Other | Admitting: Cardiology

## 2019-03-26 DIAGNOSIS — I480 Paroxysmal atrial fibrillation: Secondary | ICD-10-CM | POA: Diagnosis not present

## 2019-03-26 DIAGNOSIS — Z5181 Encounter for therapeutic drug level monitoring: Secondary | ICD-10-CM

## 2019-03-26 DIAGNOSIS — I4891 Unspecified atrial fibrillation: Secondary | ICD-10-CM | POA: Diagnosis not present

## 2019-03-26 LAB — PROTIME-INR: INR: 2.5 — AB (ref 0.9–1.1)

## 2019-03-26 NOTE — Patient Instructions (Signed)
Description   Spoke with pt's husband and instructed him to have pt continue taking Coumadin 1 tablet daily, as pt's husband has said he has been giving her the medication.  Recheck in 3 weeks. Orders faxed to [facility].

## 2019-03-26 NOTE — Progress Notes (Signed)
Pt husband stated that he has only been giving pt 1 tablet of coumadin daily instead of the 1 tablet daily except for 1.5 tablets on Thursday. He said that is how he has been instructed to give her the medication since last time her INR was checked. Instructed him to continue to give her the coumadin how he has been giving it to her. Pt's INR is to be checked on 04/13/2019.

## 2019-03-30 DIAGNOSIS — R278 Other lack of coordination: Secondary | ICD-10-CM | POA: Diagnosis not present

## 2019-03-30 DIAGNOSIS — R279 Unspecified lack of coordination: Secondary | ICD-10-CM | POA: Diagnosis not present

## 2019-03-30 DIAGNOSIS — H3530 Unspecified macular degeneration: Secondary | ICD-10-CM | POA: Diagnosis not present

## 2019-04-02 DIAGNOSIS — R279 Unspecified lack of coordination: Secondary | ICD-10-CM | POA: Diagnosis not present

## 2019-04-02 DIAGNOSIS — R278 Other lack of coordination: Secondary | ICD-10-CM | POA: Diagnosis not present

## 2019-04-02 DIAGNOSIS — H3530 Unspecified macular degeneration: Secondary | ICD-10-CM | POA: Diagnosis not present

## 2019-04-06 DIAGNOSIS — R279 Unspecified lack of coordination: Secondary | ICD-10-CM | POA: Diagnosis not present

## 2019-04-06 DIAGNOSIS — H3530 Unspecified macular degeneration: Secondary | ICD-10-CM | POA: Diagnosis not present

## 2019-04-06 DIAGNOSIS — R278 Other lack of coordination: Secondary | ICD-10-CM | POA: Diagnosis not present

## 2019-04-13 ENCOUNTER — Other Ambulatory Visit: Payer: Self-pay | Admitting: Internal Medicine

## 2019-04-13 ENCOUNTER — Ambulatory Visit (INDEPENDENT_AMBULATORY_CARE_PROVIDER_SITE_OTHER): Payer: Medicare Other | Admitting: Cardiovascular Disease

## 2019-04-13 DIAGNOSIS — Z5181 Encounter for therapeutic drug level monitoring: Secondary | ICD-10-CM | POA: Diagnosis not present

## 2019-04-13 DIAGNOSIS — I480 Paroxysmal atrial fibrillation: Secondary | ICD-10-CM

## 2019-04-13 LAB — PROTIME-INR
INR: 2.3 — AB (ref 0.9–1.1)
INR: 2.3 — ABNORMAL HIGH
Prothrombin Time: 22.6 s — ABNORMAL HIGH (ref 9.0–11.5)

## 2019-04-13 NOTE — Patient Instructions (Signed)
Description   Spoke with pt's husband and instructed him to have pt continue taking Coumadin 1 tablet daily.  Recheck in 4 weeks. Orders faxed to [facility].

## 2019-04-21 DIAGNOSIS — D1801 Hemangioma of skin and subcutaneous tissue: Secondary | ICD-10-CM | POA: Diagnosis not present

## 2019-04-21 DIAGNOSIS — L820 Inflamed seborrheic keratosis: Secondary | ICD-10-CM | POA: Diagnosis not present

## 2019-04-21 DIAGNOSIS — L821 Other seborrheic keratosis: Secondary | ICD-10-CM | POA: Diagnosis not present

## 2019-04-21 DIAGNOSIS — D692 Other nonthrombocytopenic purpura: Secondary | ICD-10-CM | POA: Diagnosis not present

## 2019-04-21 DIAGNOSIS — L570 Actinic keratosis: Secondary | ICD-10-CM | POA: Diagnosis not present

## 2019-04-22 ENCOUNTER — Other Ambulatory Visit: Payer: Self-pay | Admitting: Internal Medicine

## 2019-04-22 DIAGNOSIS — R260 Ataxic gait: Secondary | ICD-10-CM | POA: Diagnosis not present

## 2019-04-22 DIAGNOSIS — R293 Abnormal posture: Secondary | ICD-10-CM | POA: Diagnosis not present

## 2019-04-22 MED ORDER — GABAPENTIN 300 MG PO CAPS: 300.0000 mg | ORAL_CAPSULE | Freq: Two times a day (BID) | ORAL | 2 refills | Status: DC

## 2019-04-27 ENCOUNTER — Other Ambulatory Visit: Payer: Self-pay | Admitting: Internal Medicine

## 2019-04-28 NOTE — Telephone Encounter (Signed)
You recently prescribed 300 mg BID of gabapentin.  The refill request is for 100 mg qd prn. Is this in addition to the 300 mg BID dosing, or does it take the place of this dosing?

## 2019-05-03 ENCOUNTER — Other Ambulatory Visit: Payer: Self-pay | Admitting: Internal Medicine

## 2019-05-14 ENCOUNTER — Other Ambulatory Visit: Payer: Self-pay | Admitting: Internal Medicine

## 2019-05-14 ENCOUNTER — Ambulatory Visit (INDEPENDENT_AMBULATORY_CARE_PROVIDER_SITE_OTHER): Payer: Medicare Other

## 2019-05-14 DIAGNOSIS — I480 Paroxysmal atrial fibrillation: Secondary | ICD-10-CM | POA: Diagnosis not present

## 2019-05-14 DIAGNOSIS — Z5181 Encounter for therapeutic drug level monitoring: Secondary | ICD-10-CM

## 2019-05-14 DIAGNOSIS — I4891 Unspecified atrial fibrillation: Secondary | ICD-10-CM | POA: Diagnosis not present

## 2019-05-14 LAB — PROTIME-INR
INR: 1.7 — AB (ref 0.9–1.1)
INR: 2 — ABNORMAL HIGH
Prothrombin Time: 19.7 s — ABNORMAL HIGH (ref 9.0–11.5)

## 2019-05-14 NOTE — Patient Instructions (Signed)
Description   Spoke with pt's husband and instructed him to have pt continue taking Coumadin 1 tablet daily.  Recheck in 3 weeks. Orders faxed to [facility].

## 2019-05-16 ENCOUNTER — Other Ambulatory Visit: Payer: Self-pay | Admitting: Internal Medicine

## 2019-05-18 DIAGNOSIS — Z23 Encounter for immunization: Secondary | ICD-10-CM | POA: Diagnosis not present

## 2019-05-20 ENCOUNTER — Other Ambulatory Visit: Payer: Self-pay | Admitting: Internal Medicine

## 2019-05-20 DIAGNOSIS — E039 Hypothyroidism, unspecified: Secondary | ICD-10-CM

## 2019-05-21 ENCOUNTER — Other Ambulatory Visit: Payer: Self-pay

## 2019-05-21 DIAGNOSIS — R270 Ataxia, unspecified: Secondary | ICD-10-CM | POA: Diagnosis not present

## 2019-05-21 DIAGNOSIS — I480 Paroxysmal atrial fibrillation: Secondary | ICD-10-CM | POA: Diagnosis not present

## 2019-05-21 DIAGNOSIS — H3530 Unspecified macular degeneration: Secondary | ICD-10-CM | POA: Diagnosis not present

## 2019-05-21 LAB — CBC WITH DIFFERENTIAL/PLATELET
Absolute Monocytes: 382 cells/uL (ref 200–950)
Basophils Absolute: 69 cells/uL (ref 0–200)
Basophils Relative: 1.4 %
Eosinophils Absolute: 142 cells/uL (ref 15–500)
Eosinophils Relative: 2.9 %
HCT: 38.8 % (ref 35.0–45.0)
Hemoglobin: 12.4 g/dL (ref 11.7–15.5)
Lymphs Abs: 1960 cells/uL (ref 850–3900)
MCH: 28.6 pg (ref 27.0–33.0)
MCHC: 32 g/dL (ref 32.0–36.0)
MCV: 89.6 fL (ref 80.0–100.0)
MPV: 11.7 fL (ref 7.5–12.5)
Monocytes Relative: 7.8 %
Neutro Abs: 2347 cells/uL (ref 1500–7800)
Neutrophils Relative %: 47.9 %
Platelets: 232 10*3/uL (ref 140–400)
RBC: 4.33 10*6/uL (ref 3.80–5.10)
RDW: 14.1 % (ref 11.0–15.0)
Total Lymphocyte: 40 %
WBC: 4.9 10*3/uL (ref 3.8–10.8)

## 2019-05-21 LAB — COMPLETE METABOLIC PANEL WITH GFR
AG Ratio: 1.7 (calc) (ref 1.0–2.5)
ALT: 12 U/L (ref 6–29)
AST: 17 U/L (ref 10–35)
Albumin: 4 g/dL (ref 3.6–5.1)
Alkaline phosphatase (APISO): 74 U/L (ref 37–153)
BUN: 20 mg/dL (ref 7–25)
CO2: 27 mmol/L (ref 20–32)
Calcium: 9.7 mg/dL (ref 8.6–10.4)
Chloride: 105 mmol/L (ref 98–110)
Creat: 0.83 mg/dL (ref 0.60–0.88)
GFR, Est African American: 70 mL/min/{1.73_m2} (ref >=60)
GFR, Est Non African American: 60 mL/min/{1.73_m2} (ref >=60)
Globulin: 2.3 g/dL (calc) (ref 1.9–3.7)
Glucose, Bld: 90 mg/dL (ref 65–99)
Potassium: 4 mmol/L (ref 3.5–5.3)
Sodium: 141 mmol/L (ref 135–146)
Total Bilirubin: 0.4 mg/dL (ref 0.2–1.2)
Total Protein: 6.3 g/dL (ref 6.1–8.1)

## 2019-05-27 ENCOUNTER — Other Ambulatory Visit: Payer: Self-pay

## 2019-05-27 ENCOUNTER — Encounter: Payer: Self-pay | Admitting: Internal Medicine

## 2019-05-27 ENCOUNTER — Non-Acute Institutional Stay: Payer: Medicare Other | Admitting: Internal Medicine

## 2019-05-27 VITALS — BP 124/70 | HR 89 | Temp 98.1°F | Ht 61.0 in | Wt 130.4 lb

## 2019-05-27 DIAGNOSIS — M810 Age-related osteoporosis without current pathological fracture: Secondary | ICD-10-CM

## 2019-05-27 DIAGNOSIS — R259 Unspecified abnormal involuntary movements: Secondary | ICD-10-CM | POA: Diagnosis not present

## 2019-05-27 DIAGNOSIS — R197 Diarrhea, unspecified: Secondary | ICD-10-CM

## 2019-05-27 DIAGNOSIS — E039 Hypothyroidism, unspecified: Secondary | ICD-10-CM | POA: Diagnosis not present

## 2019-05-27 DIAGNOSIS — H3530 Unspecified macular degeneration: Secondary | ICD-10-CM | POA: Diagnosis not present

## 2019-05-27 DIAGNOSIS — I480 Paroxysmal atrial fibrillation: Secondary | ICD-10-CM

## 2019-05-27 DIAGNOSIS — R270 Ataxia, unspecified: Secondary | ICD-10-CM | POA: Diagnosis not present

## 2019-05-27 DIAGNOSIS — Z86718 Personal history of other venous thrombosis and embolism: Secondary | ICD-10-CM | POA: Diagnosis not present

## 2019-05-27 DIAGNOSIS — R4189 Other symptoms and signs involving cognitive functions and awareness: Secondary | ICD-10-CM

## 2019-05-27 DIAGNOSIS — E782 Mixed hyperlipidemia: Secondary | ICD-10-CM

## 2019-05-27 NOTE — Progress Notes (Signed)
Location:  [place] of Service:  Clinic (12)  Provider:   Code Status:  Goals of Care:  Advanced Directives 10/15/2018  Does Patient Have a Medical Advance Directive? No;Yes  Type of Advance Directive [place]  Does patient want to make changes to medical advance directive? -  Copy of [place] in Chart? Yes - validated most recent copy scanned in chart (See row information)  Would patient like information on creating a medical advance directive? No - Patient declined     Chief Complaint  Patient presents with  . Medical Management of Chronic Issues    Patient returns to the clinic to discuss lab results.   . Health Maintenance    Dexa scan    HPI: Patient is a 86 y.o. female seen today for medical management of chronic diseases.    Her Active Problems are  PAF ON Coumadin Follows in Coumadin Clinic Loose Stools Continue to be her main Complains Saw GI They think she has Pelvic Muscle Laxity Urinary incontinence Continues to have both stress and urgent continence needing to wear pads Does not want to see urologist at this time Cognitive impairment Lives with her husband Has been stable managing with her ADLs.  Walks with a walker has not had any falls Myoclonic jerks Has been evaluated by neurology before They think it is functional Osteoporosis On Prolia follows with rheumatology   Past Medical History:  Diagnosis Date  . Arthritis   . Atrial fibrillation ([place])   . Cataract    removed bilateral  . Clotting disorder ([place])    h/o blood clot left leg  . Dementia ([place])   . Depression   . DVT (deep venous thrombosis) ([place]) 2011  . GERD (gastroesophageal reflux disease)   . HB (heart block)    s/p PPM, most recent generator change 12/11 by JA  . Heart murmur   . Hiatal hernia   . Hyperlipidemia   . Hypertension   . Hypothyroidism   . Osteoporosis   . PTE (post-transplant erythrocytosis)   .  Pulmonary embolism ([place]) 2011   on coumadin  . UTI (lower urinary tract infection)     Past Surgical History:  Procedure Laterality Date  . BLADDER SURGERY     twice  . cataracts     bilateral  . CHOLECYSTECTOMY  09/2010  . COLONOSCOPY    . ESOPHAGOGASTRODUODENOSCOPY    . HEMORRHOID SURGERY    . hysterectomy (other)    . INNER EAR SURGERY    . JOINT REPLACEMENT    . pacemaker     most recent generator 12/11 by JA  . TOTAL KNEE ARTHROPLASTY Left     Allergies  Allergen Reactions  . Alendronate Other (See Comments)    Can not tolerate  . Celebrex [Celecoxib] Other (See Comments)    Can not tolerate  . Meloxicam Other (See Comments)    Avoid   . Myrbetriq [Mirabegron] Other (See Comments)    Can not tolerate  . Norco [Hydrocodone-Acetaminophen] Other (See Comments)    Dizziness and jerking  . Pradaxa [Dabigatran Etexilate Mesylate] Other (See Comments)    Can not tolerate  . Pradaxa [Dabigatran]   . Toviaz [Fesoterodine Fumarate Er] Other (See Comments)    Can not tolerate  . Vesicare [Solifenacin] Other (See Comments)    Can not tolerate    Outpatient Encounter Medications as of 05/27/2019  Medication Sig  . Benfotiamine 150 MG  CAPS Take 150 mg by mouth daily.  . Calcium Carb-Cholecalciferol (CALCIUM 600+D3) 600-800 MG-UNIT TABS Take 1 tablet by mouth daily.  . cholecalciferol (VITAMIN D) 1000 UNITS tablet Take 1,000 Units by mouth daily.   [name] Dextromethorphan-Guaifenesin (MUCUS RELIEF DM PO) Take 1 tablet by mouth daily.  [name] donepezil (ARICEPT) 5 MG tablet TAKE 1 TABLET AT BEDTIME  . gabapentin (NEURONTIN) 100 MG capsule TAKE 1 CAPSULE EVERY DAY AS NEEDED  . gabapentin (NEURONTIN) 300 MG capsule Take 1 capsule (300 mg total) by mouth 2 (two) times daily.  [name] levothyroxine (SYNTHROID, LEVOTHROID) 50 MCG tablet Take 1 tablet (50 mcg total) by mouth daily before breakfast.  . loratadine (CLARITIN) 10 MG tablet Take 10 mg by mouth daily.   . memantine (NAMENDA) 10 MG  tablet Take 0.5 tablets (5 mg total) by mouth 2 (two) times daily.  . Multiple Vitamins-Minerals (PRESERVISION AREDS 2 PO) Take 1 capsule by mouth 2 (two) times daily.  [name] NITROSTAT 0.4 MG SL tablet Place 0.4 mg under the tongue every 5 (five) minutes as needed for chest pain (MAX 3 TABLETS).   [name] omeprazole (PRILOSEC) 20 MG capsule Take 1 capsule (20 mg total) by mouth 2 (two) times daily.  . pravastatin (PRAVACHOL) 20 MG tablet TAKE 1 TABLET EVERY DAY  . Probiotic Product (PROBIOTIC DAILY PO) Take 1 capsule by mouth daily.  . sertraline (ZOLOFT) 25 MG tablet Take 3 tablets (75 mg total) by mouth daily. 25 mg in the mornings and 50 mg in the evenings  . warfarin (COUMADIN) 5 MG tablet TAKE 1 TABLET DAILY OR AS DIRECTED BY THE COUMADIN CLINIC.   No facility-administered encounter medications on file as of 05/27/2019.    Review of Systems:  Review of Systems  Review of Systems  Constitutional: Negative for activity change, appetite change, chills, diaphoresis, fatigue and fever.  HENT: Negative for mouth sores, postnasal drip, rhinorrhea, sinus pain and sore throat.   Respiratory: Negative for apnea, cough, chest tightness, shortness of breath and wheezing.   Cardiovascular: Negative for chest pain, palpitations and leg swelling.  Gastrointestinal: Negative for abdominal distention, abdominal pain, constipation,nausea and vomiting.  Genitourinary: Negative for dysuria and frequency.  Musculoskeletal: Negative for arthralgias, joint swelling and myalgias.  Skin: Negative for rash.  Neurological: Negative for dizziness, syncope, weakness, light-headedness and numbness.  Psychiatric/Behavioral: Negative for behavioral problems, confusion and sleep disturbance.     Health Maintenance  Topic Date Due  . DEXA SCAN  04/29/1990  . TETANUS/TDAP  02/11/2022  . INFLUENZA VACCINE  Completed  . PNA vac Low Risk Adult  Completed    Physical Exam: Vitals:   05/27/19 1538  BP: 124/70  Pulse: 89   Temp: 98.1 F (36.7 C)  SpO2: 96%  Weight: 130 lb 6.4 oz (59.1 kg)  Height: 5\' 1"  (1.549 m)   Body mass index is 24.64 kg/m. Physical Exam Constitutional: Oriented to person, place, and time. Well-developed and well-nourished.  HENT:  Head: Normocephalic.  Mouth/Throat: Oropharynx is clear and moist.  Eyes: Pupils are equal, round, and reactive to light.  Neck: Neck supple.  Cardiovascular: Normal rate and normal heart sounds.  Positive for  murmurheard. Pulmonary/Chest: Effort normal and breath sounds normal. No respiratory distress. No wheezes. She has no rales.  Abdominal: Soft. Bowel sounds are normal. No distension. There is no tenderness. There is no rebound.  Musculoskeletal: Mild Edema BIlateral  Lymphadenopathy: none Neurological: Alert and oriented to person, place, and time. But repeats herself all the time.  Walks with the walker. Has steady Gait Skin: Skin is warm and dry.  Psychiatric: Normal mood and affect. Behavior is normal. Thought content normal.   Labs reviewed: Basic Metabolic Panel: Recent Labs    06/16/18 0810 07/25/18 1556 01/22/19 0745 05/21/19 0714  NA 146  --  141 141  K 4.4  --  4.2 4.0  CL 108  --  105 105  CO2 29  --  29 27  GLUCOSE 99  --  90 90  BUN 18 20 19 20   CREATININE 0.73 0.85 0.76 0.83  CALCIUM 9.7  --  9.5 9.7  TSH  --   --  2.32  --    Liver Function Tests: Recent Labs    06/16/18 0810 01/22/19 0745 05/21/19 0714  AST 15 17 17   ALT 10 15 12   BILITOT 0.3 0.4 0.4  PROT 6.2 6.2 6.3   Recent Labs    06/16/18 0810  LIPASE 36   No results for input(s): AMMONIA in the last 8760 hours. CBC: Recent Labs    06/16/18 0810 01/22/19 0745 05/21/19 0714  WBC 6.7 5.3 4.9  NEUTROABS 3,966 2,873 2,347  HGB 12.6 12.9 12.4  HCT 38.5 40.2 38.8  MCV 87.7 92.2 89.6  PLT 232 219 232   Lipid Panel: Recent Labs    01/22/19 0745  CHOL 157  HDL 59  LDLCALC 76  TRIG 136  CHOLHDL 2.7   No results found for: HGBA1C   Procedures since last visit: No results found.  Assessment/Plan Paroxysmal atrial fibrillation (HCC) Rate controlled on Coumadin  Involuntary movements/Myoclonic jerks [facility] She has h/o Myoclonic Jerks. He has tried different regimes.And now has her onZoloft and Neurontin.He does not think she has NeuroDegenerative disorder and think it is functional Has some worsening recently due to stress of her daughter who has been diagnosed with cancer.  And due to pandemic We will continue Neurontin and Zoloft for now Mixed hyperlipidemia On Pravachol Repeat lipid panel  History of DVT (deep vein thrombosis) On Coumadin Follows with Clinic  Diarrhea  GI pathogen negative Was seen by GI and they think it is due to  Pelvic floor Laxity  CT scan also showed Gastric Polyp But EGD was negative Also Recommended Pelvic Muscle exercise Told patient to keep doing Exercise  Cognitive impairment MMSE  27/30 Failed her Clock Drawing Recall was 3/3 Continue Namenda and Aricept  Osteoporosis Follows with Rheumatology Gets Prolia Q 6 months  Acquired hypothyroidism - Continue Supplement  Mixed incontinence urge and stress Intolerant to Myrebetiq Normal to see urology yet Labs/tests ordered:  * No order type specified * Next appt:  10/15/2019

## 2019-06-04 ENCOUNTER — Ambulatory Visit (INDEPENDENT_AMBULATORY_CARE_PROVIDER_SITE_OTHER): Payer: Medicare Other | Admitting: *Deleted

## 2019-06-04 ENCOUNTER — Other Ambulatory Visit: Payer: Self-pay | Admitting: Internal Medicine

## 2019-06-04 DIAGNOSIS — I4891 Unspecified atrial fibrillation: Secondary | ICD-10-CM | POA: Diagnosis not present

## 2019-06-04 DIAGNOSIS — I442 Atrioventricular block, complete: Secondary | ICD-10-CM

## 2019-06-04 DIAGNOSIS — Z5181 Encounter for therapeutic drug level monitoring: Secondary | ICD-10-CM

## 2019-06-04 DIAGNOSIS — I480 Paroxysmal atrial fibrillation: Secondary | ICD-10-CM

## 2019-06-04 LAB — CUP PACEART REMOTE DEVICE CHECK
Battery Impedance: 1307 Ohm
Battery Remaining Longevity: 52 mo
Battery Voltage: 2.77 V
Brady Statistic AP VP Percent: 2 %
Brady Statistic AP VS Percent: 0 %
Brady Statistic AS VP Percent: 98 %
Brady Statistic AS VS Percent: 0 %
Date Time Interrogation Session: 20210121083602
Implantable Lead Implant Date: 19980518
Implantable Lead Implant Date: 20040301
Implantable Lead Location: 753859
Implantable Lead Location: 753860
Implantable Lead Model: 4269
Implantable Lead Model: 4285
Implantable Lead Serial Number: 249440
Implantable Lead Serial Number: 295540
Implantable Pulse Generator Implant Date: 20111206
Lead Channel Impedance Value: 612 Ohm
Lead Channel Impedance Value: 921 Ohm
Lead Channel Pacing Threshold Amplitude: 1.75 V
Lead Channel Pacing Threshold Pulse Width: 0.4 ms
Lead Channel Setting Pacing Amplitude: 2 V
Lead Channel Setting Pacing Amplitude: 2.5 V
Lead Channel Setting Pacing Pulse Width: 0.4 ms
Lead Channel Setting Sensing Sensitivity: 5.6 mV

## 2019-06-04 LAB — PROTIME-INR
INR: 1.3 — AB (ref 0.9–1.1)
INR: 1.3 — ABNORMAL HIGH
Prothrombin Time: 13.6 s — ABNORMAL HIGH (ref 9.0–11.5)

## 2019-06-04 NOTE — Patient Instructions (Signed)
Description   Spoke with pt's husband and instructed him to have pt take 1.5 tablets today and tomorrow then continue taking Warfarin 1 tablet daily.  Recheck in 1 week. Orders faxed to [facility].

## 2019-06-10 ENCOUNTER — Other Ambulatory Visit: Payer: Self-pay

## 2019-06-10 DIAGNOSIS — R270 Ataxia, unspecified: Secondary | ICD-10-CM | POA: Diagnosis not present

## 2019-06-10 DIAGNOSIS — H3530 Unspecified macular degeneration: Secondary | ICD-10-CM | POA: Diagnosis not present

## 2019-06-10 MED ORDER — MECLIZINE HCL 25 MG PO TABS: 25.0000 mg | ORAL_TABLET | Freq: Three times a day (TID) | ORAL | 0 refills | Status: DC | PRN

## 2019-06-11 ENCOUNTER — Ambulatory Visit (INDEPENDENT_AMBULATORY_CARE_PROVIDER_SITE_OTHER): Payer: Medicare Other

## 2019-06-11 ENCOUNTER — Other Ambulatory Visit: Payer: Self-pay | Admitting: Internal Medicine

## 2019-06-11 DIAGNOSIS — Z5181 Encounter for therapeutic drug level monitoring: Secondary | ICD-10-CM | POA: Diagnosis not present

## 2019-06-11 DIAGNOSIS — I480 Paroxysmal atrial fibrillation: Secondary | ICD-10-CM

## 2019-06-11 DIAGNOSIS — I4891 Unspecified atrial fibrillation: Secondary | ICD-10-CM | POA: Diagnosis not present

## 2019-06-11 LAB — PROTIME-INR
INR: 2 — AB (ref 0.9–1.1)
INR: 2 — ABNORMAL HIGH
Prothrombin Time: 20.1 s — ABNORMAL HIGH (ref 9.0–11.5)

## 2019-06-11 NOTE — Patient Instructions (Signed)
Description   Spoke with pt's husband and instructed him to have pt start taking 1 tablet daily except 1.5 tablets on Fridays.  Recheck in 3 weeks. Orders faxed to [facility].

## 2019-06-15 DIAGNOSIS — Z23 Encounter for immunization: Secondary | ICD-10-CM | POA: Diagnosis not present

## 2019-06-19 DIAGNOSIS — R279 Unspecified lack of coordination: Secondary | ICD-10-CM | POA: Diagnosis not present

## 2019-06-19 DIAGNOSIS — H3530 Unspecified macular degeneration: Secondary | ICD-10-CM | POA: Diagnosis not present

## 2019-06-23 DIAGNOSIS — L853 Xerosis cutis: Secondary | ICD-10-CM | POA: Diagnosis not present

## 2019-06-23 DIAGNOSIS — D1801 Hemangioma of skin and subcutaneous tissue: Secondary | ICD-10-CM | POA: Diagnosis not present

## 2019-06-23 DIAGNOSIS — D692 Other nonthrombocytopenic purpura: Secondary | ICD-10-CM | POA: Diagnosis not present

## 2019-06-23 DIAGNOSIS — L570 Actinic keratosis: Secondary | ICD-10-CM | POA: Diagnosis not present

## 2019-06-23 DIAGNOSIS — L821 Other seborrheic keratosis: Secondary | ICD-10-CM | POA: Diagnosis not present

## 2019-07-02 ENCOUNTER — Other Ambulatory Visit: Payer: Self-pay | Admitting: Internal Medicine

## 2019-07-02 ENCOUNTER — Ambulatory Visit (INDEPENDENT_AMBULATORY_CARE_PROVIDER_SITE_OTHER): Payer: Medicare Other | Admitting: Pharmacist

## 2019-07-02 DIAGNOSIS — I480 Paroxysmal atrial fibrillation: Secondary | ICD-10-CM

## 2019-07-02 DIAGNOSIS — Z5181 Encounter for therapeutic drug level monitoring: Secondary | ICD-10-CM

## 2019-07-02 DIAGNOSIS — I4891 Unspecified atrial fibrillation: Secondary | ICD-10-CM | POA: Diagnosis not present

## 2019-07-02 LAB — PROTIME-INR
INR: 2.7 — ABNORMAL HIGH
Prothrombin Time: 26.5 s — ABNORMAL HIGH (ref 9.0–11.5)

## 2019-07-06 DIAGNOSIS — M25559 Pain in unspecified hip: Secondary | ICD-10-CM | POA: Diagnosis not present

## 2019-07-06 DIAGNOSIS — M25512 Pain in left shoulder: Secondary | ICD-10-CM | POA: Diagnosis not present

## 2019-07-06 DIAGNOSIS — M810 Age-related osteoporosis without current pathological fracture: Secondary | ICD-10-CM | POA: Diagnosis not present

## 2019-07-06 DIAGNOSIS — M1990 Unspecified osteoarthritis, unspecified site: Secondary | ICD-10-CM | POA: Diagnosis not present

## 2019-07-06 DIAGNOSIS — M7060 Trochanteric bursitis, unspecified hip: Secondary | ICD-10-CM | POA: Diagnosis not present

## 2019-07-06 DIAGNOSIS — M7582 Other shoulder lesions, left shoulder: Secondary | ICD-10-CM | POA: Diagnosis not present

## 2019-07-14 DIAGNOSIS — M810 Age-related osteoporosis without current pathological fracture: Secondary | ICD-10-CM | POA: Diagnosis not present

## 2019-07-21 DIAGNOSIS — L821 Other seborrheic keratosis: Secondary | ICD-10-CM | POA: Diagnosis not present

## 2019-07-21 DIAGNOSIS — L853 Xerosis cutis: Secondary | ICD-10-CM | POA: Diagnosis not present

## 2019-07-21 DIAGNOSIS — L820 Inflamed seborrheic keratosis: Secondary | ICD-10-CM | POA: Diagnosis not present

## 2019-07-21 DIAGNOSIS — L570 Actinic keratosis: Secondary | ICD-10-CM | POA: Diagnosis not present

## 2019-07-21 DIAGNOSIS — D1801 Hemangioma of skin and subcutaneous tissue: Secondary | ICD-10-CM | POA: Diagnosis not present

## 2019-07-21 DIAGNOSIS — D692 Other nonthrombocytopenic purpura: Secondary | ICD-10-CM | POA: Diagnosis not present

## 2019-07-30 ENCOUNTER — Other Ambulatory Visit: Payer: Self-pay | Admitting: Internal Medicine

## 2019-07-30 ENCOUNTER — Ambulatory Visit (INDEPENDENT_AMBULATORY_CARE_PROVIDER_SITE_OTHER): Payer: Medicare Other | Admitting: *Deleted

## 2019-07-30 DIAGNOSIS — Z5181 Encounter for therapeutic drug level monitoring: Secondary | ICD-10-CM

## 2019-07-30 DIAGNOSIS — I480 Paroxysmal atrial fibrillation: Secondary | ICD-10-CM | POA: Diagnosis not present

## 2019-07-30 LAB — PROTIME-INR
INR: 3.2 — AB (ref 0.9–1.1)
INR: 3.2 — ABNORMAL HIGH
Prothrombin Time: 31.2 s — ABNORMAL HIGH (ref 9.0–11.5)

## 2019-07-30 NOTE — Patient Instructions (Signed)
Description   Spoke with pt's husband and instructed him to have pt hold today's dose then continue taking 1 tablet daily except 1.5 tablets on Fridays.  Recheck in 3 weeks. Orders faxed to [facility].

## 2019-07-31 ENCOUNTER — Other Ambulatory Visit: Payer: Self-pay | Admitting: Internal Medicine

## 2019-07-31 DIAGNOSIS — H6121 Impacted cerumen, right ear: Secondary | ICD-10-CM | POA: Diagnosis not present

## 2019-08-12 ENCOUNTER — Other Ambulatory Visit: Payer: Self-pay | Admitting: Internal Medicine

## 2019-08-13 ENCOUNTER — Other Ambulatory Visit: Payer: Self-pay | Admitting: Internal Medicine

## 2019-08-13 DIAGNOSIS — E039 Hypothyroidism, unspecified: Secondary | ICD-10-CM

## 2019-08-20 ENCOUNTER — Other Ambulatory Visit: Payer: Self-pay | Admitting: Internal Medicine

## 2019-08-20 ENCOUNTER — Ambulatory Visit (INDEPENDENT_AMBULATORY_CARE_PROVIDER_SITE_OTHER): Payer: Medicare Other | Admitting: Interventional Cardiology

## 2019-08-20 DIAGNOSIS — Z5181 Encounter for therapeutic drug level monitoring: Secondary | ICD-10-CM

## 2019-08-20 DIAGNOSIS — I480 Paroxysmal atrial fibrillation: Secondary | ICD-10-CM

## 2019-08-20 DIAGNOSIS — I4891 Unspecified atrial fibrillation: Secondary | ICD-10-CM | POA: Diagnosis not present

## 2019-08-20 LAB — PROTIME-INR
INR: 1.7 — AB (ref 0.9–1.1)
INR: 1.7 — ABNORMAL HIGH
Prothrombin Time: 17.5 s — ABNORMAL HIGH (ref 9.0–11.5)

## 2019-08-20 NOTE — Patient Instructions (Signed)
Description   Spoke with pt's husband and instructed him to have pt hold today's dose then continue taking 1 tablet daily except 1.5 tablets on Fridays. Recheck in 3 weeks. Orders faxed to [facility].

## 2019-08-27 DIAGNOSIS — L570 Actinic keratosis: Secondary | ICD-10-CM | POA: Diagnosis not present

## 2019-08-27 DIAGNOSIS — L821 Other seborrheic keratosis: Secondary | ICD-10-CM | POA: Diagnosis not present

## 2019-08-27 DIAGNOSIS — L853 Xerosis cutis: Secondary | ICD-10-CM | POA: Diagnosis not present

## 2019-08-27 DIAGNOSIS — D1801 Hemangioma of skin and subcutaneous tissue: Secondary | ICD-10-CM | POA: Diagnosis not present

## 2019-09-03 ENCOUNTER — Ambulatory Visit (INDEPENDENT_AMBULATORY_CARE_PROVIDER_SITE_OTHER): Payer: Medicare Other | Admitting: *Deleted

## 2019-09-03 DIAGNOSIS — I442 Atrioventricular block, complete: Secondary | ICD-10-CM

## 2019-09-03 LAB — CUP PACEART REMOTE DEVICE CHECK
Battery Impedance: 1383 Ohm
Battery Remaining Longevity: 50 mo
Battery Voltage: 2.76 V
Brady Statistic AP VP Percent: 1 %
Brady Statistic AP VS Percent: 0 %
Brady Statistic AS VP Percent: 99 %
Brady Statistic AS VS Percent: 0 %
Date Time Interrogation Session: 20210422123649
Implantable Lead Implant Date: 19980518
Implantable Lead Implant Date: 20040301
Implantable Lead Location: 753859
Implantable Lead Location: 753860
Implantable Lead Model: 4269
Implantable Lead Model: 4285
Implantable Lead Serial Number: 249440
Implantable Lead Serial Number: 295540
Implantable Pulse Generator Implant Date: 20111206
Lead Channel Impedance Value: 613 Ohm
Lead Channel Impedance Value: 893 Ohm
Lead Channel Pacing Threshold Amplitude: 1.75 V
Lead Channel Pacing Threshold Pulse Width: 0.4 ms
Lead Channel Setting Pacing Amplitude: 2 V
Lead Channel Setting Pacing Amplitude: 2.5 V
Lead Channel Setting Pacing Pulse Width: 0.4 ms
Lead Channel Setting Sensing Sensitivity: 5.6 mV

## 2019-09-04 NOTE — Progress Notes (Signed)
PPM Remote  

## 2019-09-05 ENCOUNTER — Other Ambulatory Visit: Payer: Self-pay | Admitting: Internal Medicine

## 2019-09-10 ENCOUNTER — Other Ambulatory Visit: Payer: Self-pay | Admitting: Internal Medicine

## 2019-09-10 ENCOUNTER — Ambulatory Visit (INDEPENDENT_AMBULATORY_CARE_PROVIDER_SITE_OTHER): Payer: Medicare Other | Admitting: *Deleted

## 2019-09-10 DIAGNOSIS — Z5181 Encounter for therapeutic drug level monitoring: Secondary | ICD-10-CM | POA: Diagnosis not present

## 2019-09-10 DIAGNOSIS — I480 Paroxysmal atrial fibrillation: Secondary | ICD-10-CM

## 2019-09-10 DIAGNOSIS — I4891 Unspecified atrial fibrillation: Secondary | ICD-10-CM | POA: Diagnosis not present

## 2019-09-10 LAB — PROTIME-INR
INR: 2.2 — AB (ref 0.9–1.1)
INR: 2.2 — ABNORMAL HIGH
Prothrombin Time: 23 s — ABNORMAL HIGH (ref 9.0–11.5)

## 2019-09-10 NOTE — Patient Instructions (Signed)
Description   Spoke with pt's husband and instructed him to have pt continue taking 1 tablet daily except 1.5 tablets on Fridays. Recheck in 3 weeks. Orders faxed to [facility].

## 2019-09-14 DIAGNOSIS — M810 Age-related osteoporosis without current pathological fracture: Secondary | ICD-10-CM | POA: Diagnosis not present

## 2019-09-14 DIAGNOSIS — M1990 Unspecified osteoarthritis, unspecified site: Secondary | ICD-10-CM | POA: Diagnosis not present

## 2019-09-14 DIAGNOSIS — M7582 Other shoulder lesions, left shoulder: Secondary | ICD-10-CM | POA: Diagnosis not present

## 2019-09-14 DIAGNOSIS — M7060 Trochanteric bursitis, unspecified hip: Secondary | ICD-10-CM | POA: Diagnosis not present

## 2019-09-14 DIAGNOSIS — M25512 Pain in left shoulder: Secondary | ICD-10-CM | POA: Diagnosis not present

## 2019-09-14 DIAGNOSIS — M25559 Pain in unspecified hip: Secondary | ICD-10-CM | POA: Diagnosis not present

## 2019-09-20 ENCOUNTER — Other Ambulatory Visit: Payer: Self-pay | Admitting: Internal Medicine

## 2019-09-23 ENCOUNTER — Other Ambulatory Visit: Payer: Self-pay | Admitting: Internal Medicine

## 2019-09-23 MED ORDER — LEVOTHYROXINE SODIUM 50 MCG PO TABS: 50.0000 ug | ORAL_TABLET | Freq: Every day | ORAL | 1 refills | Status: DC

## 2019-10-01 ENCOUNTER — Other Ambulatory Visit: Payer: Self-pay | Admitting: Internal Medicine

## 2019-10-01 ENCOUNTER — Ambulatory Visit (INDEPENDENT_AMBULATORY_CARE_PROVIDER_SITE_OTHER): Payer: Medicare Other

## 2019-10-01 DIAGNOSIS — I480 Paroxysmal atrial fibrillation: Secondary | ICD-10-CM

## 2019-10-01 DIAGNOSIS — Z5181 Encounter for therapeutic drug level monitoring: Secondary | ICD-10-CM

## 2019-10-01 DIAGNOSIS — I4891 Unspecified atrial fibrillation: Secondary | ICD-10-CM | POA: Diagnosis not present

## 2019-10-01 LAB — PROTIME-INR
INR: 2.3 — AB (ref <=1.1)
INR: 2.3 — ABNORMAL HIGH
Prothrombin Time: 23.3 s — ABNORMAL HIGH (ref 9.0–11.5)

## 2019-10-01 NOTE — Patient Instructions (Signed)
Description   Spoke with pt's husband and instructed him to have pt continue on same dosage 1 tablet daily except 1.5 tablets on Fridays. Recheck in 3 weeks. Orders faxed to [facility] [phone].

## 2019-10-15 ENCOUNTER — Other Ambulatory Visit: Payer: Self-pay

## 2019-10-15 DIAGNOSIS — M810 Age-related osteoporosis without current pathological fracture: Secondary | ICD-10-CM

## 2019-10-15 DIAGNOSIS — E782 Mixed hyperlipidemia: Secondary | ICD-10-CM

## 2019-10-15 DIAGNOSIS — I480 Paroxysmal atrial fibrillation: Secondary | ICD-10-CM | POA: Diagnosis not present

## 2019-10-15 DIAGNOSIS — E039 Hypothyroidism, unspecified: Secondary | ICD-10-CM

## 2019-10-15 LAB — LIPID PANEL
Cholesterol: 158 mg/dL (ref <200)
HDL: 60 mg/dL (ref >=50)
LDL Cholesterol (Calc): 78 mg/dL (calc)
Non-HDL Cholesterol (Calc): 98 mg/dL (calc) (ref <130)
Total CHOL/HDL Ratio: 2.6 (calc) (ref <5.0)
Triglycerides: 116 mg/dL (ref <150)

## 2019-10-15 LAB — COMPLETE METABOLIC PANEL WITH GFR
AG Ratio: 1.8 (calc) (ref 1.0–2.5)
ALT: 10 U/L (ref 6–29)
AST: 15 U/L (ref 10–35)
Albumin: 3.9 g/dL (ref 3.6–5.1)
Alkaline phosphatase (APISO): 73 U/L (ref 37–153)
BUN: 22 mg/dL (ref 7–25)
CO2: 28 mmol/L (ref 20–32)
Calcium: 9.7 mg/dL (ref 8.6–10.4)
Chloride: 104 mmol/L (ref 98–110)
Creat: 0.73 mg/dL (ref 0.60–0.88)
GFR, Est African American: 82 mL/min/{1.73_m2} (ref >=60)
GFR, Est Non African American: 70 mL/min/{1.73_m2} (ref >=60)
Globulin: 2.2 g/dL (calc) (ref 1.9–3.7)
Glucose, Bld: 83 mg/dL (ref 65–99)
Potassium: 4.3 mmol/L (ref 3.5–5.3)
Sodium: 141 mmol/L (ref 135–146)
Total Bilirubin: 0.4 mg/dL (ref 0.2–1.2)
Total Protein: 6.1 g/dL (ref 6.1–8.1)

## 2019-10-15 LAB — CBC WITH DIFFERENTIAL/PLATELET
Absolute Monocytes: 451 cells/uL (ref 200–950)
Basophils Absolute: 48 cells/uL (ref 0–200)
Basophils Relative: 0.9 %
Eosinophils Absolute: 69 cells/uL (ref 15–500)
Eosinophils Relative: 1.3 %
HCT: 37.4 % (ref 35.0–45.0)
Hemoglobin: 12.1 g/dL (ref 11.7–15.5)
Lymphs Abs: 1638 cells/uL (ref 850–3900)
MCH: 30 pg (ref 27.0–33.0)
MCHC: 32.4 g/dL (ref 32.0–36.0)
MCV: 92.8 fL (ref 80.0–100.0)
MPV: 11.7 fL (ref 7.5–12.5)
Monocytes Relative: 8.5 %
Neutro Abs: 3095 cells/uL (ref 1500–7800)
Neutrophils Relative %: 58.4 %
Platelets: 204 10*3/uL (ref 140–400)
RBC: 4.03 10*6/uL (ref 3.80–5.10)
RDW: 14.4 % (ref 11.0–15.0)
Total Lymphocyte: 30.9 %
WBC: 5.3 10*3/uL (ref 3.8–10.8)

## 2019-10-15 LAB — VITAMIN D 25 HYDROXY (VIT D DEFICIENCY, FRACTURES): Vit D, 25-Hydroxy: 44 ng/mL (ref 30–100)

## 2019-10-15 LAB — TSH: TSH: 2.17 mIU/L (ref 0.40–4.50)

## 2019-10-21 ENCOUNTER — Encounter: Payer: Self-pay | Admitting: Internal Medicine

## 2019-10-21 ENCOUNTER — Other Ambulatory Visit: Payer: Self-pay

## 2019-10-21 ENCOUNTER — Non-Acute Institutional Stay: Payer: Medicare Other | Admitting: Internal Medicine

## 2019-10-21 VITALS — BP 122/68 | HR 73 | Temp 97.6°F | Ht 61.0 in | Wt 128.6 lb

## 2019-10-21 DIAGNOSIS — G253 Myoclonus: Secondary | ICD-10-CM | POA: Diagnosis not present

## 2019-10-21 DIAGNOSIS — I480 Paroxysmal atrial fibrillation: Secondary | ICD-10-CM | POA: Diagnosis not present

## 2019-10-21 DIAGNOSIS — M542 Cervicalgia: Secondary | ICD-10-CM | POA: Diagnosis not present

## 2019-10-21 DIAGNOSIS — M810 Age-related osteoporosis without current pathological fracture: Secondary | ICD-10-CM | POA: Diagnosis not present

## 2019-10-21 DIAGNOSIS — E782 Mixed hyperlipidemia: Secondary | ICD-10-CM

## 2019-10-21 DIAGNOSIS — E039 Hypothyroidism, unspecified: Secondary | ICD-10-CM

## 2019-10-21 NOTE — Progress Notes (Signed)
Location:  [place] of Service:  Clinic (12)  Provider:   Code Status:  Goals of Care:  Advanced Directives 10/15/2018  Does Patient Have a Medical Advance Directive? No;Yes  Type of Advance Directive [place]  Does patient want to make changes to medical advance directive? -  Copy of [place] in Chart? Yes - validated most recent copy scanned in chart (See row information)  Would patient like information on creating a medical advance directive? No - Patient declined     Chief Complaint  Patient presents with  . Medical Management of Chronic Issues    5 month follow up. She continues to have difficulty hearing.   [name] Health Maintenance    Dexa scan    HPI: Patient is a 86 y.o. female seen today for medical management of chronic diseases.   Loose Stools Continues to be her Issues Myoclonic jerks Has seen neurology before and they think it is functional.  She is on Zoloft Osteoporosis on Prolia per rheumatology Urinary incontinence Cognitive impairment  Neck Pain She was also complaining of some neck pain and stiffness in her neck  Patient came for a follow-up today continues to complain of loose stools.  Also complaining of being tired fatigue sleeping a lot.  Continues to walk with her walker.  Slightly upset because recently lost her daughter to cancer.  Has cognitive impairment and is hard of hearing.  Usually the husband takes care of everything for her.  Continues to be independent in her ADLs  Past Medical History:  Diagnosis Date  . Arthritis   . Atrial fibrillation ([place])   . Cataract    removed bilateral  . Clotting disorder ([place])    h/o blood clot left leg  . Dementia ([place])   . Depression   . DVT (deep venous thrombosis) ([place]) 2011  . GERD (gastroesophageal reflux disease)   . HB (heart block)    s/p PPM, most recent generator change 12/11 by JA  . Heart murmur   . Hiatal hernia   . Hyperlipidemia    . Hypertension   . Hypothyroidism   . Osteoporosis   . PTE (post-transplant erythrocytosis)   . Pulmonary embolism ([place]) 2011   on coumadin  . UTI (lower urinary tract infection)     Past Surgical History:  Procedure Laterality Date  . BLADDER SURGERY     twice  . cataracts     bilateral  . CHOLECYSTECTOMY  09/2010  . COLONOSCOPY    . ESOPHAGOGASTRODUODENOSCOPY    . HEMORRHOID SURGERY    . hysterectomy (other)    . INNER EAR SURGERY    . JOINT REPLACEMENT    . pacemaker     most recent generator 12/11 by JA  . TOTAL KNEE ARTHROPLASTY Left     Allergies  Allergen Reactions  . Alendronate Other (See Comments)    Can not tolerate  . Celebrex [Celecoxib] Other (See Comments)    Can not tolerate  . Meloxicam Other (See Comments)    Avoid   . Myrbetriq [Mirabegron] Other (See Comments)    Can not tolerate  . Norco [Hydrocodone-Acetaminophen] Other (See Comments)    Dizziness and jerking  . Pradaxa [Dabigatran Etexilate Mesylate] Other (See Comments)    Can not tolerate  . Pradaxa [Dabigatran]   . Toviaz [Fesoterodine Fumarate Er] Other (See Comments)    Can not tolerate  . Vesicare [Solifenacin] Other (See Comments)  Can not tolerate    Outpatient Encounter Medications as of 10/21/2019  Medication Sig  . Dextromethorphan-Guaifenesin (MUCUS RELIEF DM PO) Take 1 tablet by mouth daily.  . Multiple Vitamins-Minerals (PRESERVISION AREDS 2 PO) Take 1 capsule by mouth 2 (two) times daily.  . Benfotiamine 150 MG CAPS Take 150 mg by mouth daily.  . Calcium Carb-Cholecalciferol (CALCIUM 600+D3) 600-800 MG-UNIT TABS Take 1 tablet by mouth daily.  . cholecalciferol (VITAMIN D) 1000 UNITS tablet Take 1,000 Units by mouth daily.   [name] donepezil (ARICEPT) 5 MG tablet TAKE 1 TABLET AT BEDTIME  . gabapentin (NEURONTIN) 100 MG capsule TAKE 1 CAPSULE EVERY DAY AS NEEDED  . gabapentin (NEURONTIN) 300 MG capsule Take 1 capsule (300 mg total) by mouth 2 (two) times daily.  [name]  levothyroxine (SYNTHROID) 50 MCG tablet Take 1 tablet (50 mcg total) by mouth daily before breakfast.  . loratadine (CLARITIN) 10 MG tablet Take 10 mg by mouth daily.   . meclizine (ANTIVERT) 25 MG tablet Take 1 tablet (25 mg total) by mouth 3 (three) times daily as needed for dizziness.  . memantine (NAMENDA) 10 MG tablet TAKE 1/2 TABLET TWICE DAILY  . NITROSTAT 0.4 MG SL tablet Place 0.4 mg under the tongue every 5 (five) minutes as needed for chest pain (MAX 3 TABLETS).   [name] omeprazole (PRILOSEC) 20 MG capsule TAKE 1 CAPSULE TWICE DAILY  . pravastatin (PRAVACHOL) 20 MG tablet TAKE 1 TABLET EVERY DAY  . Probiotic Product (PROBIOTIC DAILY PO) Take 1 capsule by mouth daily.  . sertraline (ZOLOFT) 25 MG tablet TAKE 1 TABLET IN THE MORNING, AND 2 TABLETS IN THE EVENING AS DIRECTED. TOTAL 3 TABLETS DAILY.   [name] warfarin (COUMADIN) 5 MG tablet TAKE 1 TABLET DAILY OR AS DIRECTED BY THE COUMADIN CLINIC.   No facility-administered encounter medications on file as of 10/21/2019.    Review of Systems:  Review of Systems  Constitutional: Positive for activity change.  HENT: Negative.   Respiratory: Negative.   Cardiovascular: Negative.   Gastrointestinal: Positive for abdominal distention.  Genitourinary: Positive for frequency and urgency.  Musculoskeletal: Negative.   Neurological: Positive for weakness.  Psychiatric/Behavioral: Positive for confusion and dysphoric mood.    Health Maintenance  Topic Date Due  . DEXA SCAN  Never done  . INFLUENZA VACCINE  12/13/2019  . TETANUS/TDAP  02/11/2022  . COVID-19 Vaccine  Completed  . PNA vac Low Risk Adult  Completed    Physical Exam: Vitals:   10/21/19 1511  BP: 122/68  Pulse: 73  Temp: 97.6 F (36.4 C)  SpO2: 98%  Weight: 128 lb 9.6 oz (58.3 kg)  Height: 5\' 1"  (1.549 m)   Body mass index is 24.3 kg/m. Physical Exam Vitals reviewed.  Constitutional:      Appearance: Normal appearance.  HENT:     Head: Normocephalic.     Nose: Nose  normal.     Mouth/Throat:     Mouth: Mucous membranes are moist.     Pharynx: Oropharynx is clear.  Eyes:     Pupils: Pupils are equal, round, and reactive to light.  Cardiovascular:     Rate and Rhythm: Normal rate and regular rhythm.     Pulses: Normal pulses.  Pulmonary:     Effort: Pulmonary effort is normal. No respiratory distress.     Breath sounds: Normal breath sounds. No wheezing or rales.  Abdominal:     General: Abdomen is flat. Bowel sounds are normal.     Palpations: Abdomen  is soft.  Musculoskeletal:        General: No swelling.     Cervical back: Neck supple.     Comments: Mild Pain in left Side of her Neck  Skin:    General: Skin is warm.  Neurological:     General: No focal deficit present.     Mental Status: She is alert and oriented to person, place, and time.     Comments: Does gets jerks in which her whole body starts jerking on the Exam table  Psychiatric:        Mood and Affect: Mood normal.        Thought Content: Thought content normal.     Labs reviewed: Basic Metabolic Panel: Recent Labs    01/22/19 0745 05/21/19 0714 10/15/19 0000  NA 141 141 141  K 4.2 4.0 4.3  CL 105 105 104  CO2 29 27 28   GLUCOSE 90 90 83  BUN 19 20 22   CREATININE 0.76 0.83 0.73  CALCIUM 9.5 9.7 9.7  TSH 2.32  --  2.17   Liver Function Tests: Recent Labs    01/22/19 0745 05/21/19 0714 10/15/19 0000  AST 17 17 15   ALT 15 12 10   BILITOT 0.4 0.4 0.4  PROT 6.2 6.3 6.1   No results for input(s): LIPASE, AMYLASE in the last 8760 hours. No results for input(s): AMMONIA in the last 8760 hours. CBC: Recent Labs    01/22/19 0745 05/21/19 0714 10/15/19 0000  WBC 5.3 4.9 5.3  NEUTROABS 2,873 2,347 3,095  HGB 12.9 12.4 12.1  HCT 40.2 38.8 37.4  MCV 92.2 89.6 92.8  PLT 219 232 204   Lipid Panel: Recent Labs    01/22/19 0745 10/15/19 0000  CHOL 157 158  HDL 59 60  LDLCALC 76 78  TRIG 136 116  CHOLHDL 2.7 2.6   No results found for:  HGBA1C  Procedures since last visit: No results found.  Assessment/Plan Paroxysmal atrial fibrillation (HCC) On Coumadin Acquired hypothyroidism TSH normal Mixed hyperlipidemia LDL Less then 100 On Statin Myoclonic jerking [facility] She has h/o Myoclonic Jerks. He has tried different regimes.And now has her onZoloft and Neurontin.He does not think she has NeuroDegenerative disorder and think it is functional Cannot Reduce Zoloft or Neurontin Fatigue All the Labs are normal Can be related to Zoloft But does not want to change the dose as she says her mind goes out of control  Loose stool GI pathogen negative Was seen by GI and they think it is duetoPelvic floor Laxity  CT scan also showed Gastric Polyp But EGD was negative Also Recommended Pelvic Muscle exercise Told patient to keep doing Exercise  Age-related osteoporosis without current pathological fracture ON Prolia by rheumatology Needs Follow up DEXA by them  Cognitive impairment MMSE  27/30 before Failed her Clock Drawing Recall was 3/3 Continue Namenda and Aricept Told to increase the Namenda to 1 tab BID  Neck pain Try Biofreeze If not better Xray and therapy Most likely due to Degenerative disease  Mixed incontinence urge and stress Intolerant to Myrebetiq Normal to see urology yet  Labs/tests ordered:  * No order type specified * Next appt:  Visit date not found

## 2019-10-22 ENCOUNTER — Other Ambulatory Visit: Payer: Self-pay | Admitting: Internal Medicine

## 2019-10-22 ENCOUNTER — Ambulatory Visit (INDEPENDENT_AMBULATORY_CARE_PROVIDER_SITE_OTHER): Payer: Medicare Other | Admitting: Pharmacist

## 2019-10-22 DIAGNOSIS — I4891 Unspecified atrial fibrillation: Secondary | ICD-10-CM | POA: Diagnosis not present

## 2019-10-22 DIAGNOSIS — Z5181 Encounter for therapeutic drug level monitoring: Secondary | ICD-10-CM

## 2019-10-22 LAB — PROTIME-INR
INR: 2.5 — ABNORMAL HIGH
Prothrombin Time: 25.6 s — ABNORMAL HIGH (ref 9.0–11.5)

## 2019-11-04 ENCOUNTER — Other Ambulatory Visit: Payer: Self-pay | Admitting: Internal Medicine

## 2019-11-04 MED ORDER — MEMANTINE HCL 10 MG PO TABS: 10.0000 mg | ORAL_TABLET | Freq: Two times a day (BID) | ORAL | 0 refills | Status: DC

## 2019-11-12 ENCOUNTER — Other Ambulatory Visit: Payer: Self-pay | Admitting: Cardiovascular Disease

## 2019-11-12 ENCOUNTER — Ambulatory Visit (INDEPENDENT_AMBULATORY_CARE_PROVIDER_SITE_OTHER): Payer: Medicare Other | Admitting: Cardiology

## 2019-11-12 DIAGNOSIS — Z5181 Encounter for therapeutic drug level monitoring: Secondary | ICD-10-CM

## 2019-11-12 DIAGNOSIS — I4891 Unspecified atrial fibrillation: Secondary | ICD-10-CM

## 2019-11-12 LAB — PROTIME-INR
INR: 2.2 — AB (ref 0.9–1.1)
INR: 2.2 — ABNORMAL HIGH
Prothrombin Time: 23 s — ABNORMAL HIGH (ref 9.0–11.5)

## 2019-11-12 NOTE — Patient Instructions (Signed)
Spoke with pt's husband and instructed him to have pt continue on same dosage 1 tablet daily except 1.5 tablets on Fridays. Recheck in 4 weeks. Orders faxed to [facility] [phone].

## 2019-11-13 ENCOUNTER — Encounter: Payer: Self-pay | Admitting: Internal Medicine

## 2019-11-13 ENCOUNTER — Ambulatory Visit (INDEPENDENT_AMBULATORY_CARE_PROVIDER_SITE_OTHER)
Admission: RE | Admit: 2019-11-13 | Discharge: 2019-11-13 | Disposition: A | Discharge status: Home / Self Care | Payer: Medicare Other | Source: Ambulatory Visit | Attending: Internal Medicine | Admitting: Internal Medicine

## 2019-11-13 ENCOUNTER — Ambulatory Visit (INDEPENDENT_AMBULATORY_CARE_PROVIDER_SITE_OTHER): Payer: Medicare Other | Admitting: Internal Medicine

## 2019-11-13 ENCOUNTER — Other Ambulatory Visit: Payer: Self-pay

## 2019-11-13 ENCOUNTER — Other Ambulatory Visit: Payer: Medicare Other

## 2019-11-13 VITALS — BP 120/70 | HR 80 | Ht 59.0 in | Wt 132.1 lb

## 2019-11-13 DIAGNOSIS — K529 Noninfective gastroenteritis and colitis, unspecified: Secondary | ICD-10-CM

## 2019-11-13 DIAGNOSIS — R197 Diarrhea, unspecified: Secondary | ICD-10-CM | POA: Diagnosis not present

## 2019-11-13 DIAGNOSIS — R152 Fecal urgency: Secondary | ICD-10-CM

## 2019-11-13 DIAGNOSIS — R159 Full incontinence of feces: Secondary | ICD-10-CM

## 2019-11-13 DIAGNOSIS — M160 Bilateral primary osteoarthritis of hip: Secondary | ICD-10-CM | POA: Diagnosis not present

## 2019-11-13 NOTE — Patient Instructions (Signed)
Please go to the basement for an x-ray today before leaving and then stop by the lab to get containers to put stool in.   I appreciate the opportunity to care for you. Silvano Rusk, MD, [facility]

## 2019-11-13 NOTE — Progress Notes (Signed)
[name] 86 y.o. Dec 03, 1922 [id number]  Assessment & Plan:   Encounter Diagnoses  Name Primary?  . Incontinence of feces with fecal urgency Yes  . Chronic diarrhea    Does not seem like an overflow issue though her abd films do show large amount of stool so possible. She has had pelvic floor PT w/o change.  Sxs are stable over yrs so reassuring re: malignancy, which is on her minds.  Donepezil still may be contributing though I think she had a trial off in past.   Orders Placed This Encounter  Procedures  . Calprotectin, Fecal  . DG Abd 2 Views  . Pancreatic Elastase, Fecal   Await stool studies for next step - if calprotectin high will need further eval of colon/small bowel  If elastase low try pancreatic enzymes and consider repeat imaging   If negative would consider trial of colestipol  Cc;Gupta, Anjali L, MD  Subjective:   Chief Complaint:urgent BM's, fecal incontinence  HPI The patient is here with her son-in-law, his wife and her daughter unfortunately died from amyloidosis in the recent past.  She continues with frequent urgent explosive bowel movements similar to what she described last year.  She did pelvic floor physical therapy but that did not seem to make a difference.  She is worried about the possibility of cancer.  Last year a CT scan of the abdomen and pelvis was negative she had a gastric polyp seen which I removed with endoscopy.  CBC is normal again this year.  Her weight is stable.  She does have fecal incontinence on an urgent basis at times. Wt Readings from Last 3 Encounters:  11/13/19 132 lb 2 oz (59.9 kg)  10/21/19 128 lb 9.6 oz (58.3 kg)  05/27/19 130 lb 6.4 oz (59.1 kg)   In the past donepezil was held, I believe, w/o sig change.  Allergies  Allergen Reactions  . Alendronate Other (See Comments)    Can not tolerate  . Celebrex [Celecoxib] Other (See Comments)    Can not tolerate  . Meloxicam Other (See Comments)     Avoid   . Myrbetriq [Mirabegron] Other (See Comments)    Can not tolerate  . Norco [Hydrocodone-Acetaminophen] Other (See Comments)    Dizziness and jerking  . Pradaxa [Dabigatran Etexilate Mesylate] Other (See Comments)    Can not tolerate  . Pradaxa [Dabigatran]   . Toviaz [Fesoterodine Fumarate Er] Other (See Comments)    Can not tolerate  . Vesicare [Solifenacin] Other (See Comments)    Can not tolerate   Current Meds  Medication Sig  . Calcium Carb-Cholecalciferol (CALCIUM 600+D3) 600-800 MG-UNIT TABS Take 1 tablet by mouth daily.  . cholecalciferol (VITAMIN D) 1000 UNITS tablet Take 1,000 Units by mouth daily.   [name] Dextromethorphan-Guaifenesin (MUCUS RELIEF DM PO) Take 1 tablet by mouth daily.  [name] donepezil (ARICEPT) 5 MG tablet TAKE 1 TABLET AT BEDTIME  . gabapentin (NEURONTIN) 300 MG capsule Take 1 capsule (300 mg total) by mouth 2 (two) times daily.  [name] levothyroxine (SYNTHROID) 50 MCG tablet Take 1 tablet (50 mcg total) by mouth daily before breakfast.  . loratadine (CLARITIN) 10 MG tablet Take 10 mg by mouth daily.   . memantine (NAMENDA) 10 MG tablet Take 1 tablet (10 mg total) by mouth 2 (two) times daily.  . Multiple Vitamins-Minerals (PRESERVISION AREDS 2 PO) Take 1 capsule by mouth 2 (two) times daily.  [name] omeprazole (PRILOSEC) 20 MG capsule TAKE 1 CAPSULE TWICE DAILY  .  pravastatin (PRAVACHOL) 20 MG tablet TAKE 1 TABLET EVERY DAY  . Probiotic Product (PROBIOTIC DAILY PO) Take 1 capsule by mouth daily.  . sertraline (ZOLOFT) 25 MG tablet TAKE 1 TABLET IN THE MORNING, AND 2 TABLETS IN THE EVENING AS DIRECTED. TOTAL 3 TABLETS DAILY.   [name] warfarin (COUMADIN) 5 MG tablet TAKE 1 TABLET DAILY OR AS DIRECTED BY THE COUMADIN CLINIC.   Past Medical History:  Diagnosis Date  . Arthritis   . Atrial fibrillation ([place])   . Cataract    removed bilateral  . Clotting disorder ([place])    h/o blood clot left leg  . Dementia ([place])   . Depression   . DVT (deep venous thrombosis) ([place])  2011  . GERD (gastroesophageal reflux disease)   . HB (heart block)    s/p PPM, most recent generator change 12/11 by JA  . Heart murmur   . Hiatal hernia   . Hyperlipidemia   . Hypertension   . Hypothyroidism   . Osteoporosis   . PTE (post-transplant erythrocytosis)   . Pulmonary embolism ([place]) 2011   on coumadin  . UTI (lower urinary tract infection)    Past Surgical History:  Procedure Laterality Date  . BLADDER SURGERY     twice  . cataracts     bilateral  . CHOLECYSTECTOMY  09/2010  . COLONOSCOPY    . ESOPHAGOGASTRODUODENOSCOPY    . HEMORRHOID SURGERY    . hysterectomy (other)    . INNER EAR SURGERY    . JOINT REPLACEMENT    . pacemaker     most recent generator 12/11 by JA  . TOTAL KNEE ARTHROPLASTY Left    Social History   Social History Narrative   Tobacco use, amount per day now: 0      Past tobacco use, amount per day: 0      How many years did you use tobacco: 0      Alcohol use (drinks per week): 0      Diet:      Do you drink/eat things with caffeine? Yes      Marital status: Married            What year were you married? 1946      Do you live in a house, apartment, assisted living, condo, trailer? Apartment       Is it one or more stories? 1      How many persons live in your home? 2      Do you have any pets in your home? 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 Yes            How often? 3-4 times a week       Do you have a living will? Yes      Do you have a DNR form?            If not, do you want to discuss one?      Do you have signed POA/HPOA forms? Yes             family history includes Arthritis in her father and mother; Breast cancer in her sister; Heart disease in her father and mother; Liver cancer in her brother; Stomach cancer in her brother and brother; Stroke in her father.   Review of Systems As per HPI  Objective:   Physical Exam BP 120/70 (BP Location: Left Arm,  Patient Position:  Sitting, Cuff Size: Normal)   Pulse 80   Ht 4\' 11"  (1.499 m) Comment: height measured without shoes  Wt 132 lb 2 oz (59.9 kg)   BMI 26.69 kg/m  Well-developed well-nourished elderly white woman no acute distress she moves slowly requiring assistance  Female staff present, abdominal exam soft nontender no organomegaly or mass.  Rectal exam reveals a normal anoderm has normal anal tone at rest but weak squeeze, simulated defecation shows appropriate abdominal contraction and descent.  Soft tan stool present.  Total time 33 mins

## 2019-11-18 ENCOUNTER — Other Ambulatory Visit: Payer: Medicare Other

## 2019-11-18 DIAGNOSIS — R159 Full incontinence of feces: Secondary | ICD-10-CM | POA: Diagnosis not present

## 2019-11-18 DIAGNOSIS — R152 Fecal urgency: Secondary | ICD-10-CM | POA: Diagnosis not present

## 2019-11-18 DIAGNOSIS — K529 Noninfective gastroenteritis and colitis, unspecified: Secondary | ICD-10-CM

## 2019-11-21 LAB — PANCREATIC ELASTASE, FECAL: Pancreatic Elastase, Fecal: 170 ug Elast./g — ABNORMAL LOW (ref >200)

## 2019-11-21 LAB — CALPROTECTIN, FECAL: Calprotectin, Fecal: 239 ug/g — ABNORMAL HIGH (ref 0–120)

## 2019-11-23 ENCOUNTER — Other Ambulatory Visit: Payer: Self-pay

## 2019-11-23 MED ORDER — BUDESONIDE 3 MG PO CPEP
9.0000 mg | ORAL_CAPSULE | Freq: Every day | ORAL | 1 refills | Status: DC
Start: 2019-11-23 — End: 2020-02-22

## 2019-11-25 ENCOUNTER — Telehealth: Payer: Self-pay

## 2019-11-25 NOTE — Telephone Encounter (Signed)
I have started a prior authorization for patients budesonide 3mg  capsules for her inflammatory diarrhea and possible microscopic colitis thru [facility]. ICD-10 code: R19.7

## 2019-11-26 NOTE — Telephone Encounter (Signed)
Budesonide approved thru 05/13/2020 according to the fax from [facility]. This information faxed to the [place].

## 2019-11-27 NOTE — Telephone Encounter (Signed)
I spoke with Mr Calmes and he said they are shipping the medicine, it's $274.72. I told him we had gotten it approved so he is going to call them back and see if they will credit some of the money back or was this the price of it. He will call me back with any issues , he has my name.

## 2019-11-27 NOTE — Telephone Encounter (Signed)
Patients husband called to follow up on alternative medication

## 2019-12-03 ENCOUNTER — Ambulatory Visit (INDEPENDENT_AMBULATORY_CARE_PROVIDER_SITE_OTHER): Payer: Medicare Other | Admitting: *Deleted

## 2019-12-03 DIAGNOSIS — I4891 Unspecified atrial fibrillation: Secondary | ICD-10-CM

## 2019-12-04 LAB — CUP PACEART REMOTE DEVICE CHECK
Battery Impedance: 1354 Ohm
Battery Remaining Longevity: 51 mo
Battery Voltage: 2.76 V
Brady Statistic AP VP Percent: 1 %
Brady Statistic AP VS Percent: 0 %
Brady Statistic AS VP Percent: 99 %
Brady Statistic AS VS Percent: 0 %
Date Time Interrogation Session: 20210722143824
Implantable Lead Implant Date: 19980518
Implantable Lead Implant Date: 20040301
Implantable Lead Location: 753859
Implantable Lead Location: 753860
Implantable Lead Model: 4269
Implantable Lead Model: 4285
Implantable Lead Serial Number: 249440
Implantable Lead Serial Number: 295540
Implantable Pulse Generator Implant Date: 20111206
Lead Channel Impedance Value: 594 Ohm
Lead Channel Impedance Value: 919 Ohm
Lead Channel Pacing Threshold Amplitude: 1.75 V
Lead Channel Pacing Threshold Pulse Width: 0.4 ms
Lead Channel Setting Pacing Amplitude: 2 V
Lead Channel Setting Pacing Amplitude: 2.5 V
Lead Channel Setting Pacing Pulse Width: 0.4 ms
Lead Channel Setting Sensing Sensitivity: 5.6 mV

## 2019-12-04 NOTE — Progress Notes (Signed)
Remote pacemaker transmission.   

## 2019-12-10 ENCOUNTER — Ambulatory Visit (INDEPENDENT_AMBULATORY_CARE_PROVIDER_SITE_OTHER): Payer: Medicare Other

## 2019-12-10 DIAGNOSIS — I4891 Unspecified atrial fibrillation: Secondary | ICD-10-CM | POA: Diagnosis not present

## 2019-12-10 DIAGNOSIS — Z5181 Encounter for therapeutic drug level monitoring: Secondary | ICD-10-CM | POA: Diagnosis not present

## 2019-12-10 LAB — PROTIME-INR: INR: 2.6 — AB (ref <=1.1)

## 2019-12-10 NOTE — Patient Instructions (Signed)
Description   Spoke with pt's husband and instructed him to have pt continue on same dosage 1 tablet daily except 1.5 tablets on Fridays. Recheck in 4 weeks. Orders faxed to [facility] [phone].

## 2019-12-17 DIAGNOSIS — L821 Other seborrheic keratosis: Secondary | ICD-10-CM | POA: Diagnosis not present

## 2019-12-17 DIAGNOSIS — D1801 Hemangioma of skin and subcutaneous tissue: Secondary | ICD-10-CM | POA: Diagnosis not present

## 2019-12-17 DIAGNOSIS — L853 Xerosis cutis: Secondary | ICD-10-CM | POA: Diagnosis not present

## 2019-12-17 DIAGNOSIS — L570 Actinic keratosis: Secondary | ICD-10-CM | POA: Diagnosis not present

## 2019-12-26 ENCOUNTER — Other Ambulatory Visit: Payer: Self-pay | Admitting: Internal Medicine

## 2020-01-06 ENCOUNTER — Other Ambulatory Visit: Payer: Self-pay | Admitting: Internal Medicine

## 2020-01-07 ENCOUNTER — Other Ambulatory Visit: Payer: Self-pay | Admitting: Internal Medicine

## 2020-01-07 ENCOUNTER — Ambulatory Visit (INDEPENDENT_AMBULATORY_CARE_PROVIDER_SITE_OTHER): Payer: Medicare Other

## 2020-01-07 DIAGNOSIS — I4891 Unspecified atrial fibrillation: Secondary | ICD-10-CM | POA: Diagnosis not present

## 2020-01-07 DIAGNOSIS — Z5181 Encounter for therapeutic drug level monitoring: Secondary | ICD-10-CM

## 2020-01-07 DIAGNOSIS — I480 Paroxysmal atrial fibrillation: Secondary | ICD-10-CM | POA: Diagnosis not present

## 2020-01-07 LAB — PROTIME-INR
INR: 3.2 — AB (ref 0.9–1.1)
INR: 3.2 — ABNORMAL HIGH
Prothrombin Time: 32.6 s — ABNORMAL HIGH (ref 9.0–11.5)

## 2020-01-07 NOTE — Patient Instructions (Signed)
Description   Spoke with pt's husband and instructed him to have pt skip today's dosage of Warfarin, then resume same dosage 1 tablet daily except 1.5 tablets on Fridays. Recheck in 2 weeks. Orders faxed to [facility] [phone].

## 2020-01-15 ENCOUNTER — Telehealth: Payer: Self-pay | Admitting: Internal Medicine

## 2020-01-15 NOTE — Telephone Encounter (Signed)
Patients husband called and is requesting to speak with you only about an appt

## 2020-01-15 NOTE — Telephone Encounter (Signed)
[name] wanted to make the follow up appointment for [name]. That is set up for 02/22/2020 at 2:50pm.

## 2020-01-19 DIAGNOSIS — E039 Hypothyroidism, unspecified: Secondary | ICD-10-CM | POA: Diagnosis not present

## 2020-01-19 DIAGNOSIS — M1990 Unspecified osteoarthritis, unspecified site: Secondary | ICD-10-CM | POA: Diagnosis not present

## 2020-01-19 DIAGNOSIS — M25559 Pain in unspecified hip: Secondary | ICD-10-CM | POA: Diagnosis not present

## 2020-01-19 DIAGNOSIS — M810 Age-related osteoporosis without current pathological fracture: Secondary | ICD-10-CM | POA: Diagnosis not present

## 2020-01-19 DIAGNOSIS — M25512 Pain in left shoulder: Secondary | ICD-10-CM | POA: Diagnosis not present

## 2020-01-19 DIAGNOSIS — M7060 Trochanteric bursitis, unspecified hip: Secondary | ICD-10-CM | POA: Diagnosis not present

## 2020-01-19 DIAGNOSIS — M7582 Other shoulder lesions, left shoulder: Secondary | ICD-10-CM | POA: Diagnosis not present

## 2020-01-21 ENCOUNTER — Other Ambulatory Visit: Payer: Self-pay | Admitting: Internal Medicine

## 2020-01-21 ENCOUNTER — Encounter: Payer: Self-pay | Admitting: Pharmacist

## 2020-01-21 ENCOUNTER — Ambulatory Visit (INDEPENDENT_AMBULATORY_CARE_PROVIDER_SITE_OTHER): Payer: Medicare Other | Admitting: Pharmacist

## 2020-01-21 DIAGNOSIS — I4891 Unspecified atrial fibrillation: Secondary | ICD-10-CM | POA: Diagnosis not present

## 2020-01-21 DIAGNOSIS — I4811 Longstanding persistent atrial fibrillation: Secondary | ICD-10-CM | POA: Diagnosis not present

## 2020-01-21 DIAGNOSIS — Z7901 Long term (current) use of anticoagulants: Secondary | ICD-10-CM | POA: Diagnosis not present

## 2020-01-21 DIAGNOSIS — Z5181 Encounter for therapeutic drug level monitoring: Secondary | ICD-10-CM

## 2020-01-21 LAB — PROTIME-INR
INR: 3.9 — ABNORMAL HIGH
INR: 3.9 — AB (ref 0.9–1.1)
Prothrombin Time: 38.9 s — ABNORMAL HIGH (ref 9.0–11.5)

## 2020-01-21 NOTE — Patient Instructions (Signed)
Description   Spoke with pt's husband and instructed him to have pt skip today's dosage of Warfarin, then reduce dosage to 1 tablet every day. Recheck in 2 weeks. Orders faxed to [facility] [phone].

## 2020-01-31 ENCOUNTER — Other Ambulatory Visit: Payer: Self-pay | Admitting: Internal Medicine

## 2020-02-02 DIAGNOSIS — Z23 Encounter for immunization: Secondary | ICD-10-CM | POA: Diagnosis not present

## 2020-02-05 ENCOUNTER — Other Ambulatory Visit: Payer: Self-pay | Admitting: Internal Medicine

## 2020-02-05 ENCOUNTER — Ambulatory Visit (INDEPENDENT_AMBULATORY_CARE_PROVIDER_SITE_OTHER): Payer: Medicare Other | Admitting: Cardiology

## 2020-02-05 DIAGNOSIS — I4891 Unspecified atrial fibrillation: Secondary | ICD-10-CM | POA: Diagnosis not present

## 2020-02-05 DIAGNOSIS — Z5181 Encounter for therapeutic drug level monitoring: Secondary | ICD-10-CM

## 2020-02-05 DIAGNOSIS — Z7901 Long term (current) use of anticoagulants: Secondary | ICD-10-CM | POA: Diagnosis not present

## 2020-02-05 LAB — PROTIME-INR
INR: 3.1 — AB (ref 0.9–1.1)
INR: 3.1 — ABNORMAL HIGH
Prothrombin Time: 31.7 s — ABNORMAL HIGH (ref 9.0–11.5)

## 2020-02-05 NOTE — Patient Instructions (Addendum)
Description   Spoke with pt's husband and instructed him to have pt start taking 1 tablet daily except for 1/2 a tablet on Fridays.  Recheck in 2 weeks. Orders faxed to [facility] [phone].

## 2020-02-06 ENCOUNTER — Other Ambulatory Visit: Payer: Self-pay | Admitting: Internal Medicine

## 2020-02-13 ENCOUNTER — Other Ambulatory Visit: Payer: Self-pay | Admitting: Internal Medicine

## 2020-02-17 ENCOUNTER — Other Ambulatory Visit: Payer: Self-pay | Admitting: Internal Medicine

## 2020-02-18 ENCOUNTER — Other Ambulatory Visit: Payer: Self-pay | Admitting: Internal Medicine

## 2020-02-18 ENCOUNTER — Ambulatory Visit (INDEPENDENT_AMBULATORY_CARE_PROVIDER_SITE_OTHER): Payer: Medicare Other | Admitting: *Deleted

## 2020-02-18 DIAGNOSIS — L814 Other melanin hyperpigmentation: Secondary | ICD-10-CM | POA: Diagnosis not present

## 2020-02-18 DIAGNOSIS — I4891 Unspecified atrial fibrillation: Secondary | ICD-10-CM | POA: Diagnosis not present

## 2020-02-18 DIAGNOSIS — Z5181 Encounter for therapeutic drug level monitoring: Secondary | ICD-10-CM

## 2020-02-18 DIAGNOSIS — L820 Inflamed seborrheic keratosis: Secondary | ICD-10-CM | POA: Diagnosis not present

## 2020-02-18 DIAGNOSIS — Z961 Presence of intraocular lens: Secondary | ICD-10-CM | POA: Diagnosis not present

## 2020-02-18 DIAGNOSIS — H353231 Exudative age-related macular degeneration, bilateral, with active choroidal neovascularization: Secondary | ICD-10-CM | POA: Diagnosis not present

## 2020-02-18 DIAGNOSIS — L570 Actinic keratosis: Secondary | ICD-10-CM | POA: Diagnosis not present

## 2020-02-18 DIAGNOSIS — L821 Other seborrheic keratosis: Secondary | ICD-10-CM | POA: Diagnosis not present

## 2020-02-18 LAB — PROTIME-INR
INR: 3.5 — AB (ref 0.9–1.1)
INR: 3.5 — ABNORMAL HIGH
Prothrombin Time: 35.7 s — ABNORMAL HIGH (ref 9.0–11.5)

## 2020-02-19 NOTE — Patient Instructions (Signed)
Description   10/7 @447pm , spoke with pt's husband and instructed him to have hold today's dose tonight since he could not write instructions he would call us tomorrow for further instructions as we need to change her dose.   New dose will be be 1 tablet daily except for 1/2 tablet on Monday and Fridays.  Recheck in 2 weeks. Orders faxed to [facility]-[phone].  02/19/20 Called spoke with pt's husband, pt did hold yesterday's (02/18/20) dosage of Warfarin.  Advised to have pt start taking 1 tablet daily except 1/2 tablet on Mondays and Fridays.  Recheck INR in 2 weeks.

## 2020-02-22 ENCOUNTER — Encounter: Payer: Self-pay | Admitting: Internal Medicine

## 2020-02-22 ENCOUNTER — Ambulatory Visit (INDEPENDENT_AMBULATORY_CARE_PROVIDER_SITE_OTHER): Payer: Medicare Other | Admitting: Internal Medicine

## 2020-02-22 VITALS — BP 130/62 | HR 89 | Ht 60.0 in | Wt 136.2 lb

## 2020-02-22 DIAGNOSIS — R152 Fecal urgency: Secondary | ICD-10-CM

## 2020-02-22 DIAGNOSIS — R159 Full incontinence of feces: Secondary | ICD-10-CM | POA: Diagnosis not present

## 2020-02-22 DIAGNOSIS — K529 Noninfective gastroenteritis and colitis, unspecified: Secondary | ICD-10-CM | POA: Diagnosis not present

## 2020-02-22 MED ORDER — COLESTIPOL HCL 5 G PO GRAN
5.0000 g | GRANULES | Freq: Every day | ORAL | 1 refills | Status: DC
Start: 2020-02-22 — End: 2021-05-05

## 2020-02-22 NOTE — Progress Notes (Signed)
[name] 86 y.o. 03-24-1923 [id number]  Assessment & Plan:   Encounter Diagnoses  Name Primary?  . Chronic diarrhea Yes  . Incontinence of feces with fecal urgency     The calprotectin elevation was modest, it does suggest some type of inflammatory condition.  However at 53 performing a colonoscopy is not something neither she nor I prefer to do.  Since budesonide did not make a difference I am going to change gears and put her on colestipol 5 g with supper.  She is not taking medications around that time so it should be acceptable to use this medication and avoiding malabsorption of other medications based upon what I know about her regimen.  Sertraline may contribute to diarrhea I do not know how much that is contributing with her but could consider changing that SSRI to 1 less likely to cause diarrhea   I will see her back in about 2 months to reassess.  I appreciate the opportunity to care for this patient. CC: [name], MD   Subjective:   Chief Complaint: Diarrhea  HPI The patient is a 86 year old white woman here with her son due to complaints of diarrhea and fecal incontinence.  I have been seeing her since 2020 for this please see previous notes for full details.  She has not had impactions, trial of holding Aricept has not made a difference, and pelvic floor physical therapy performed [facility] has not seemed to make a big difference.  More recently I had her do a fecal calprotectin and a fecal elastase.  The calprotectin was somewhat elevated so a trial of budesonide was undertaken but that has made no difference she continues to have 3 stools that tend to spray in be liquid and has urgency and fear if not some incontinence.   Allergies  Allergen Reactions  . Alendronate Other (See Comments)    Can not tolerate  . Celebrex [Celecoxib] Other (See Comments)    Can not tolerate  . Meloxicam Other (See  Comments)    Avoid   . Myrbetriq [Mirabegron] Other (See Comments)    Can not tolerate  . Norco [Hydrocodone-Acetaminophen] Other (See Comments)    Dizziness and jerking  . Pradaxa [Dabigatran Etexilate Mesylate] Other (See Comments)    Can not tolerate  . Pradaxa [Dabigatran]   . Toviaz [Fesoterodine Fumarate Er] Other (See Comments)    Can not tolerate  . Vesicare [Solifenacin] Other (See Comments)    Can not tolerate   Current Meds  Medication Sig  . Calcium Carb-Cholecalciferol (CALCIUM 600+D3) 600-800 MG-UNIT TABS Take 1 tablet by mouth daily.  . cholecalciferol (VITAMIN D) 1000 UNITS tablet Take 1,000 Units by mouth daily.   [name] Dextromethorphan-Guaifenesin (MUCUS RELIEF DM PO) Take 1 tablet by mouth daily.  [name] docusate sodium (COLACE) 100 MG capsule Take 100 mg by mouth as needed for mild constipation.   [name] donepezil (ARICEPT) 5 MG tablet TAKE 1 TABLET AT BEDTIME  . gabapentin (NEURONTIN) 300 MG capsule TAKE 1 CAPSULE TWICE DAILY  . levothyroxine (SYNTHROID) 50 MCG tablet TAKE 1 TABLET (50 MCG TOTAL) BY MOUTH DAILY BEFORE BREAKFAST.  [name] loratadine (CLARITIN) 10 MG tablet Take 10 mg by mouth daily.   . memantine (NAMENDA) 10 MG tablet TAKE 1 TABLET TWICE DAILY  . Multiple Vitamins-Minerals (PRESERVISION AREDS 2 PO) Take 1 capsule by mouth 2 (two) times daily.  [name] NITROSTAT 0.4 MG SL tablet Place 0.4 mg under the tongue  every 5 (five) minutes as needed for chest pain (MAX 3 TABLETS).   [name] omeprazole (PRILOSEC) 20 MG capsule TAKE 1 CAPSULE TWICE DAILY  . pravastatin (PRAVACHOL) 20 MG tablet TAKE 1 TABLET EVERY DAY  . Probiotic Product (PROBIOTIC DAILY PO) Take 1 capsule by mouth daily.  Orlie Dakin Sodium (SENNA S PO) Take by mouth as needed.   . sertraline (ZOLOFT) 25 MG tablet TAKE 1 TABLET IN THE MORNING, AND 2 TABLETS IN THE EVENING AS DIRECTED. TOTAL 3 TABLETS DAILY.  [name] warfarin (COUMADIN) 5 MG tablet TAKE 1 TABLET DAILY OR AS DIRECTED BY THE COUMADIN CLINIC.  .  [DISCONTINUED] budesonide (ENTOCORT EC) 3 MG 24 hr capsule Take 3 capsules (9 mg total) by mouth daily.   Past Medical History:  Diagnosis Date  . Arthritis   . Atrial fibrillation ([place])   . Cataract    removed bilateral  . Clotting disorder ([place])    h/o blood clot left leg  . Dementia ([place])   . Depression   . DVT (deep venous thrombosis) ([place]) 2011  . GERD (gastroesophageal reflux disease)   . HB (heart block)    s/p PPM, most recent generator change 12/11 by JA  . Heart murmur   . Hiatal hernia   . Hyperlipidemia   . Hypertension   . Hypothyroidism   . Osteoporosis   . PTE (post-transplant erythrocytosis)   . Pulmonary embolism ([place]) 2011   on coumadin  . UTI (lower urinary tract infection)    Past Surgical History:  Procedure Laterality Date  . BLADDER SURGERY     twice  . cataracts     bilateral  . CHOLECYSTECTOMY  09/2010  . COLONOSCOPY    . ESOPHAGOGASTRODUODENOSCOPY    . HEMORRHOID SURGERY    . hysterectomy (other)    . INNER EAR SURGERY    . JOINT REPLACEMENT    . pacemaker     most recent generator 12/11 by JA  . TOTAL KNEE ARTHROPLASTY Left    Social History   Social History Narrative   Tobacco use, amount per day now: 0      Past tobacco use, amount per day: 0      How many years did you use tobacco: 0      Alcohol use (drinks per week): 0      Diet:      Do you drink/eat things with caffeine? Yes      Marital status: Married            What year were you married? 1946      Do you live in a house, apartment, assisted living, condo, trailer? Apartment       Is it one or more stories? 1      How many persons live in your home? 2      Do you have any pets in your home? 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 Yes            How often? 3-4 times a week       Do you have a living will? Yes      Do you have a DNR form?            If not, do you want to discuss one?      Do you have signed POA/HPOA forms?  Yes  family history includes Arthritis in her father and mother; Breast cancer in her sister; Heart disease in her father and mother; Liver cancer in her brother; Stomach cancer in her brother and brother; Stroke in her father.   Review of Systems As per HPI Ambulates with a walker  Objective:   Physical Exam BP 130/62   Pulse 89   Ht 5' (1.524 m)   Wt 136 lb 4 oz (61.8 kg)   BMI 26.61 kg/m  Elderly white woman no acute distress She is hard of hearing Bilateral lower extremity edema is noted

## 2020-02-22 NOTE — Patient Instructions (Signed)
Stop the budesonide per Dr [name].  We have sent the following medications to your pharmacy for you to pick up at your convenience: Colestipol  Follow up with Dr [name] in several months.   I appreciate the opportunity to care for you. Silvano Rusk, MD, [facility]

## 2020-02-25 ENCOUNTER — Other Ambulatory Visit: Payer: Self-pay | Admitting: Internal Medicine

## 2020-02-26 ENCOUNTER — Other Ambulatory Visit: Payer: Self-pay | Admitting: Internal Medicine

## 2020-02-29 NOTE — Telephone Encounter (Signed)
I replaced it with cholestyramine

## 2020-02-29 NOTE — Telephone Encounter (Signed)
Please advise Sir, thank you. 

## 2020-03-02 LAB — CUP PACEART REMOTE DEVICE CHECK
Battery Impedance: 1461 Ohm
Battery Remaining Longevity: 47 mo
Battery Voltage: 2.76 V
Brady Statistic AP VP Percent: 1 %
Brady Statistic AP VS Percent: 0 %
Brady Statistic AS VP Percent: 99 %
Brady Statistic AS VS Percent: 0 %
Date Time Interrogation Session: 20211020140631
Implantable Lead Implant Date: 19980518
Implantable Lead Implant Date: 20040301
Implantable Lead Location: 753859
Implantable Lead Location: 753860
Implantable Lead Model: 4269
Implantable Lead Model: 4285
Implantable Lead Serial Number: 249440
Implantable Lead Serial Number: 295540
Implantable Pulse Generator Implant Date: 20111206
Lead Channel Impedance Value: 594 Ohm
Lead Channel Impedance Value: 835 Ohm
Lead Channel Pacing Threshold Amplitude: 1.25 V
Lead Channel Pacing Threshold Pulse Width: 0.4 ms
Lead Channel Setting Pacing Amplitude: 2 V
Lead Channel Setting Pacing Amplitude: 2.5 V
Lead Channel Setting Pacing Pulse Width: 0.4 ms
Lead Channel Setting Sensing Sensitivity: 5.6 mV

## 2020-03-03 ENCOUNTER — Ambulatory Visit (INDEPENDENT_AMBULATORY_CARE_PROVIDER_SITE_OTHER): Payer: Medicare Other

## 2020-03-03 ENCOUNTER — Other Ambulatory Visit: Payer: Self-pay | Admitting: Internal Medicine

## 2020-03-03 DIAGNOSIS — Z5181 Encounter for therapeutic drug level monitoring: Secondary | ICD-10-CM | POA: Diagnosis not present

## 2020-03-03 DIAGNOSIS — I4891 Unspecified atrial fibrillation: Secondary | ICD-10-CM | POA: Diagnosis not present

## 2020-03-03 DIAGNOSIS — I442 Atrioventricular block, complete: Secondary | ICD-10-CM

## 2020-03-03 LAB — PROTIME-INR
INR: 2 — AB (ref 0.9–1.1)
INR: 2 — ABNORMAL HIGH
Prothrombin Time: 20.5 s — ABNORMAL HIGH (ref 9.0–11.5)

## 2020-03-03 NOTE — Patient Instructions (Signed)
Description   Called spoke with pt's husband, advised to have pt continue on same dosage 1 tablet daily except for 1/2 tablet on Mondays and Fridays.  Recheck in 2 weeks. Orders faxed to [facility] [phone].

## 2020-03-09 NOTE — Progress Notes (Signed)
Remote pacemaker transmission.   

## 2020-03-16 ENCOUNTER — Encounter: Payer: Self-pay | Admitting: Internal Medicine

## 2020-03-16 ENCOUNTER — Non-Acute Institutional Stay: Payer: Medicare Other | Admitting: Internal Medicine

## 2020-03-16 ENCOUNTER — Other Ambulatory Visit: Payer: Self-pay

## 2020-03-16 VITALS — BP 142/76 | HR 86 | Temp 96.3°F | Ht 60.0 in | Wt 135.2 lb

## 2020-03-16 DIAGNOSIS — R4189 Other symptoms and signs involving cognitive functions and awareness: Secondary | ICD-10-CM | POA: Diagnosis not present

## 2020-03-16 DIAGNOSIS — E039 Hypothyroidism, unspecified: Secondary | ICD-10-CM | POA: Diagnosis not present

## 2020-03-16 DIAGNOSIS — I480 Paroxysmal atrial fibrillation: Secondary | ICD-10-CM

## 2020-03-16 DIAGNOSIS — G253 Myoclonus: Secondary | ICD-10-CM | POA: Diagnosis not present

## 2020-03-16 DIAGNOSIS — I350 Nonrheumatic aortic (valve) stenosis: Secondary | ICD-10-CM

## 2020-03-16 DIAGNOSIS — E782 Mixed hyperlipidemia: Secondary | ICD-10-CM | POA: Diagnosis not present

## 2020-03-16 DIAGNOSIS — M810 Age-related osteoporosis without current pathological fracture: Secondary | ICD-10-CM

## 2020-03-16 DIAGNOSIS — R600 Localized edema: Secondary | ICD-10-CM | POA: Diagnosis not present

## 2020-03-16 MED ORDER — POTASSIUM CHLORIDE CRYS ER 20 MEQ PO TBCR: 20.0000 meq | EXTENDED_RELEASE_TABLET | Freq: Every day | ORAL | 3 refills | Status: DC

## 2020-03-16 MED ORDER — FUROSEMIDE 20 MG PO TABS: 20.0000 mg | ORAL_TABLET | Freq: Every day | ORAL | 1 refills | Status: DC

## 2020-03-16 NOTE — Progress Notes (Signed)
Location:  [place] of Service:  Clinic (12)  Provider:   Code Status:  Goals of Care:  Advanced Directives 10/15/2018  Does Patient Have a Medical Advance Directive? No;Yes  Type of Advance Directive [place]  Does patient want to make changes to medical advance directive? -  Copy of [place] in Chart? Yes - validated most recent copy scanned in chart (See row information)  Would patient like information on creating a medical advance directive? No - Patient declined     Chief Complaint  Patient presents with  . Acute Visit    Swollen foot    HPI: Patient is a 86 y.o. female seen today for Acute visit  Patient has a history of CHB s/p pacemaker, PAF on Coumadin, moderate AS Also has history of cognitive impairment, myoclonic jerks thought to be functional on Zoloft Osteoporosis on Prolia per rheumatology Urinary incontinence Chronic diarrhea record by GI it now on cholestyramine  Patient came in today as she has noticed swelling in both her legs getting worse over the past 1 month.  Patient is unable to wear her shoes because of that and has been just walking around with her socks.  Complaining of pain in both her legs.  Some redness around the ankle.  No discharge.  Also complains of shortness of breath but no cough or PND no chest pain.        Past Medical History:  Diagnosis Date  . Arthritis   . Atrial fibrillation ([place])   . Cataract    removed bilateral  . Clotting disorder ([place])    h/o blood clot left leg  . Dementia ([place])   . Depression   . DVT (deep venous thrombosis) ([place]) 2011  . GERD (gastroesophageal reflux disease)   . HB (heart block)    s/p PPM, most recent generator change 12/11 by JA  . Heart murmur   . Hiatal hernia   . Hyperlipidemia   . Hypertension   . Hypothyroidism   . Osteoporosis   . PTE (post-transplant erythrocytosis)   . Pulmonary embolism ([place]) 2011   on coumadin  . UTI  (lower urinary tract infection)     Past Surgical History:  Procedure Laterality Date  . BLADDER SURGERY     twice  . cataracts     bilateral  . CHOLECYSTECTOMY  09/2010  . COLONOSCOPY    . ESOPHAGOGASTRODUODENOSCOPY    . HEMORRHOID SURGERY    . hysterectomy (other)    . INNER EAR SURGERY    . JOINT REPLACEMENT    . pacemaker     most recent generator 12/11 by JA  . TOTAL KNEE ARTHROPLASTY Left     Allergies  Allergen Reactions  . Alendronate Other (See Comments)    Can not tolerate  . Celebrex [Celecoxib] Other (See Comments)    Can not tolerate  . Meloxicam Other (See Comments)    Avoid   . Myrbetriq [Mirabegron] Other (See Comments)    Can not tolerate  . Norco [Hydrocodone-Acetaminophen] Other (See Comments)    Dizziness and jerking  . Pradaxa [Dabigatran Etexilate Mesylate] Other (See Comments)    Can not tolerate  . Pradaxa [Dabigatran]   . Toviaz [Fesoterodine Fumarate Er] Other (See Comments)    Can not tolerate  . Vesicare [Solifenacin] Other (See Comments)    Can not tolerate    Outpatient Encounter Medications as of 03/16/2020  Medication Sig  . Calcium  Carb-Cholecalciferol (CALCIUM 600+D3) 600-800 MG-UNIT TABS Take 1 tablet by mouth daily.  . cholecalciferol (VITAMIN D) 1000 UNITS tablet Take 1,000 Units by mouth daily.   . cholestyramine light (PREVALITE) 4 g packet Take 1 packet (4 g total) by mouth daily with supper.  . Dextromethorphan-Guaifenesin (MUCUS RELIEF DM PO) Take 1 tablet by mouth daily.  [name] docusate sodium (COLACE) 100 MG capsule Take 100 mg by mouth as needed for mild constipation.   [name] donepezil (ARICEPT) 5 MG tablet TAKE 1 TABLET AT BEDTIME  . gabapentin (NEURONTIN) 300 MG capsule TAKE 1 CAPSULE TWICE DAILY  . levothyroxine (SYNTHROID) 50 MCG tablet TAKE 1 TABLET (50 MCG TOTAL) BY MOUTH DAILY BEFORE BREAKFAST.  [name] loratadine (CLARITIN) 10 MG tablet Take 10 mg by mouth daily.   . memantine (NAMENDA) 10 MG tablet TAKE 1 TABLET TWICE DAILY    . Multiple Vitamins-Minerals (PRESERVISION AREDS 2 PO) Take 1 capsule by mouth 2 (two) times daily.  [name] NITROSTAT 0.4 MG SL tablet Place 0.4 mg under the tongue every 5 (five) minutes as needed for chest pain (MAX 3 TABLETS).   [name] omeprazole (PRILOSEC) 20 MG capsule TAKE 1 CAPSULE TWICE DAILY  . pravastatin (PRAVACHOL) 20 MG tablet TAKE 1 TABLET EVERY DAY  . Probiotic Product (PROBIOTIC DAILY PO) Take 1 capsule by mouth daily.  Orlie Dakin Sodium (SENNA S PO) Take by mouth as needed.   . sertraline (ZOLOFT) 25 MG tablet TAKE 1 TABLET IN THE MORNING, AND 2 TABLETS IN THE EVENING AS DIRECTED. TOTAL 3 TABLETS DAILY.  [name] warfarin (COUMADIN) 5 MG tablet TAKE 1 TABLET DAILY OR AS DIRECTED BY THE COUMADIN CLINIC.  . furosemide (LASIX) 20 MG tablet Take 1 tablet (20 mg total) by mouth daily.  . potassium chloride SA (KLOR-CON) 20 MEQ tablet Take 1 tablet (20 mEq total) by mouth daily.  . [DISCONTINUED] colestipol (COLESTID) 5 g granules Take 5 g by mouth daily with supper.   No facility-administered encounter medications on file as of 03/16/2020.    Review of Systems:  Review of Systems  Constitutional: Negative.   HENT: Negative.   Respiratory: Positive for shortness of breath.   Cardiovascular: Positive for leg swelling.  Gastrointestinal: Positive for constipation and diarrhea.  Genitourinary: Positive for frequency.  Musculoskeletal: Positive for gait problem.  Neurological: Negative for dizziness.  Psychiatric/Behavioral: Positive for confusion.  All other systems reviewed and are negative.   Health Maintenance  Topic Date Due  . DEXA SCAN  Never done  . TETANUS/TDAP  02/11/2022  . INFLUENZA VACCINE  Completed  . COVID-19 Vaccine  Completed  . PNA vac Low Risk Adult  Completed    Physical Exam: Vitals:   03/16/20 1505  BP: (!) 142/76  Pulse: 86  Temp: (!) 96.3 F (35.7 C)  SpO2: 98%  Weight: 135 lb 3.2 oz (61.3 kg)  Height: 5' (1.524 m)   Body mass index is 26.4  kg/m. Physical Exam Vitals reviewed.  Constitutional:      Appearance: Normal appearance.  HENT:     Head: Normocephalic.     Nose: Nose normal.     Mouth/Throat:     Mouth: Mucous membranes are moist.     Pharynx: Oropharynx is clear.  Eyes:     Pupils: Pupils are equal, round, and reactive to light.  Cardiovascular:     Rate and Rhythm: Normal rate and regular rhythm.     Pulses: Normal pulses.     Heart sounds: Murmur heard.  Pulmonary:     Effort: Pulmonary effort is normal. No respiratory distress.     Breath sounds: Normal breath sounds. No wheezing or rales.  Abdominal:     General: Abdomen is flat. Bowel sounds are normal.     Palpations: Abdomen is soft.  Musculoskeletal:        General: Swelling present.     Cervical back: Neck supple.     Comments: Has Bilateral Edema with redness around the ankle with some tenderness  Skin:    General: Skin is warm.  Neurological:     General: No focal deficit present.     Mental Status: She is alert.  Psychiatric:        Mood and Affect: Mood normal.        Thought Content: Thought content normal.     Labs reviewed: Basic Metabolic Panel: Recent Labs    05/21/19 0714 10/15/19 0000  NA 141 141  K 4.0 4.3  CL 105 104  CO2 27 28  GLUCOSE 90 83  BUN 20 22  CREATININE 0.83 0.73  CALCIUM 9.7 9.7  TSH  --  2.17   Liver Function Tests: Recent Labs    05/21/19 0714 10/15/19 0000  AST 17 15  ALT 12 10  BILITOT 0.4 0.4  PROT 6.3 6.1   No results for input(s): LIPASE, AMYLASE in the last 8760 hours. No results for input(s): AMMONIA in the last 8760 hours. CBC: Recent Labs    05/21/19 0714 10/15/19 0000  WBC 4.9 5.3  NEUTROABS 2,347 3,095  HGB 12.4 12.1  HCT 38.8 37.4  MCV 89.6 92.8  PLT 232 204   Lipid Panel: Recent Labs    10/15/19 0000  CHOL 158  HDL 60  LDLCALC 78  TRIG 116  CHOLHDL 2.6   No results found for: HGBA1C  Procedures since last visit: CUP PACEART REMOTE DEVICE  CHECK  Result Date: 03/02/2020 Scheduled remote reviewed. Normal device function.  Atrial arrhythmias detected that were all less than five minutes Next remote 91 days. Kathy Breach, RN, CCDS, [employer]   Assessment/Plan Bilateral leg edema Will start her on Lasix and Potassium BMP in 1 week  Follow up in 1 week with me Also Refer to Cardiology for follow up as has h/o AS  Paroxysmal atrial fibrillation ([place]) On Coumadin   Acquired hypothyroidism TSH Normal in 6/21 Mixed hyperlipidemia LDL 78 on Statin Myoclonic jerking Functional Has to continue Zoloft as nothing else helps her [facility] She has h/o Myoclonic Jerks. He has tried different regimes.And now has her onZoloft and Neurontin.He does not think she has NeuroDegenerative disorder and think it is functional Cannot Reduce Zoloft or Neurontin  Age-related osteoporosis without current pathological fracture Prolia by her Rheumatologist Cognitive impairment D/W husband to discontinue Aricept Can continue Namenda MMSE 27/30 before Failed her Clock Drawing Recall was 3/3 Chronic Diarrhea Following with GI Will discontinue Aricept for now  Mixed incontinence urge and stress Intolerant to Myrebetiq Does not want to  see urology yet    Labs/tests ordered:  * No order type specified * Next appt:  03/23/2020   Total time spent in this patient care encounter was  60_  minutes; greater than 50% of the visit spent counseling patient and staff, reviewing records , Labs and coordinating care for problems addressed at this encounter.

## 2020-03-16 NOTE — Patient Instructions (Addendum)
Take Lasix 20 mg everyday with potassium 20 meq every day.  Please bring all medications to next visit.

## 2020-03-17 ENCOUNTER — Other Ambulatory Visit: Payer: Self-pay | Admitting: Internal Medicine

## 2020-03-17 ENCOUNTER — Ambulatory Visit (INDEPENDENT_AMBULATORY_CARE_PROVIDER_SITE_OTHER): Payer: Medicare Other

## 2020-03-17 DIAGNOSIS — I4891 Unspecified atrial fibrillation: Secondary | ICD-10-CM | POA: Diagnosis not present

## 2020-03-17 DIAGNOSIS — Z5181 Encounter for therapeutic drug level monitoring: Secondary | ICD-10-CM

## 2020-03-17 LAB — PROTIME-INR
INR: 1.8 — AB (ref 0.9–1.1)
INR: 1.8 — ABNORMAL HIGH
Prothrombin Time: 18.3 s — ABNORMAL HIGH (ref 9.0–11.5)

## 2020-03-17 NOTE — Patient Instructions (Signed)
Description   Called spoke with pt's husband, advised to have pt start taking 1 tablet daily except for 1/2 tablet on Mondays.  Recheck in 2 weeks. Orders faxed to [facility] [phone].

## 2020-03-21 ENCOUNTER — Other Ambulatory Visit: Payer: Self-pay

## 2020-03-21 ENCOUNTER — Encounter: Payer: Self-pay | Admitting: Interventional Cardiology

## 2020-03-21 ENCOUNTER — Ambulatory Visit (INDEPENDENT_AMBULATORY_CARE_PROVIDER_SITE_OTHER): Payer: Medicare Other | Admitting: Interventional Cardiology

## 2020-03-21 VITALS — BP 130/68 | HR 90 | Ht 60.0 in | Wt 134.0 lb

## 2020-03-21 DIAGNOSIS — Z950 Presence of cardiac pacemaker: Secondary | ICD-10-CM

## 2020-03-21 DIAGNOSIS — Z7901 Long term (current) use of anticoagulants: Secondary | ICD-10-CM | POA: Diagnosis not present

## 2020-03-21 DIAGNOSIS — R600 Localized edema: Secondary | ICD-10-CM

## 2020-03-21 NOTE — Progress Notes (Signed)
Cardiology Office Note   Date:  03/21/2020   ID:  IRVIN LIZAMA, DOB 12-30-1922, MRN [id number]  PCP:  [name], MD    No chief complaint on file.  Leg swelling  Wt Readings from Last 3 Encounters:  03/21/20 134 lb (60.8 kg)  03/16/20 135 lb 3.2 oz (61.3 kg)  02/22/20 136 lb 4 oz (61.8 kg)       History of Present Illness: [name] is a 86 y.o. female  Who has seen Dr. Rayann Heman for a pacer.  She was set up for an acute visit due to leg swelling.  Accompanied by her son-in-law ( daughter died of amyloid).  She has had a left leg DVT/PE many years ago.  2016 echo showed normal EF, mild AS.   She has been on Coumadin.  She has had severe leg swelling for several months and was sent by her internist at the home.  Se tried one dose of lasix so this worsened the incontinence.  Denies : Chest pain. Dizziness.  Nitroglycerin use. Orthopnea. Palpitations. Paroxysmal nocturnal dyspnea. Shortness of breath. Syncope.     Past Medical History:  Diagnosis Date  . Arthritis   . Atrial fibrillation ([place])   . Cataract    removed bilateral  . Clotting disorder ([place])    h/o blood clot left leg  . Dementia ([place])   . Depression   . DVT (deep venous thrombosis) ([place]) 2011  . GERD (gastroesophageal reflux disease)   . HB (heart block)    s/p PPM, most recent generator change 12/11 by JA  . Heart murmur   . Hiatal hernia   . Hyperlipidemia   . Hypertension   . Hypothyroidism   . Osteoporosis   . PTE (post-transplant erythrocytosis)   . Pulmonary embolism ([place]) 2011   on coumadin  . UTI (lower urinary tract infection)     Past Surgical History:  Procedure Laterality Date  . BLADDER SURGERY     twice  . cataracts     bilateral  . CHOLECYSTECTOMY  09/2010  . COLONOSCOPY    . ESOPHAGOGASTRODUODENOSCOPY    . HEMORRHOID SURGERY    . hysterectomy (other)    . INNER EAR SURGERY    . JOINT REPLACEMENT    . pacemaker     most recent generator 12/11  by JA  . TOTAL KNEE ARTHROPLASTY Left      Current Outpatient Medications  Medication Sig Dispense Refill  . Calcium Carb-Cholecalciferol (CALCIUM 600+D3) 600-800 MG-UNIT TABS Take 1 tablet by mouth daily.    . cholecalciferol (VITAMIN D) 1000 UNITS tablet Take 1,000 Units by mouth daily.     . cholestyramine light (PREVALITE) 4 g packet Take 1 packet (4 g total) by mouth daily with supper. 90 packet 1  . Dextromethorphan-Guaifenesin (MUCUS RELIEF DM PO) Take 1 tablet by mouth daily.    [name] docusate sodium (COLACE) 100 MG capsule Take 100 mg by mouth as needed for mild constipation.     . gabapentin (NEURONTIN) 300 MG capsule TAKE 1 CAPSULE TWICE DAILY 180 capsule 1  . levothyroxine (SYNTHROID) 50 MCG tablet TAKE 1 TABLET (50 MCG TOTAL) BY MOUTH DAILY BEFORE BREAKFAST. 90 tablet 1  . loratadine (CLARITIN) 10 MG tablet Take 10 mg by mouth daily.     . memantine (NAMENDA) 10 MG tablet TAKE 1 TABLET TWICE DAILY 180 tablet 0  . Multiple Vitamins-Minerals (PRESERVISION AREDS 2 PO) Take 1 capsule by mouth 2 (two) times  daily.    [name] NITROSTAT 0.4 MG SL tablet Place 0.4 mg under the tongue every 5 (five) minutes as needed for chest pain (MAX 3 TABLETS).     [name] omeprazole (PRILOSEC) 20 MG capsule TAKE 1 CAPSULE TWICE DAILY 180 capsule 1  . pravastatin (PRAVACHOL) 20 MG tablet TAKE 1 TABLET EVERY DAY 90 tablet 1  . Probiotic Product (PROBIOTIC DAILY PO) Take 1 capsule by mouth daily.    Orlie Dakin Sodium (SENNA S PO) Take by mouth as needed.     . sertraline (ZOLOFT) 25 MG tablet TAKE 1 TABLET IN THE MORNING, AND 2 TABLETS IN THE EVENING AS DIRECTED. TOTAL 3 TABLETS DAILY. 270 tablet 1  . warfarin (COUMADIN) 5 MG tablet TAKE 1 TABLET DAILY OR AS DIRECTED BY THE COUMADIN CLINIC. 100 tablet 1   No current facility-administered medications for this visit.    Allergies:   Alendronate, Celebrex [celecoxib], Meloxicam, Myrbetriq [mirabegron], Norco [hydrocodone-acetaminophen], Pradaxa [dabigatran  etexilate mesylate], Pradaxa [dabigatran], Toviaz [fesoterodine fumarate er], and Vesicare [solifenacin]    Social History:  The patient  reports that she has never smoked. She has never used smokeless tobacco. She reports that she does not drink alcohol and does not use drugs.   Family History:  The patient's family history includes Arthritis in her father and mother; Breast cancer in her sister; Heart disease in her father and mother; Liver cancer in her brother; Stomach cancer in her brother and brother; Stroke in her father.    ROS:  Please see the history of present illness.   Otherwise, review of systems are positive for leg swelling.   All other systems are reviewed and negative.    PHYSICAL EXAM: VS:  BP 130/68   Pulse 90   Ht 5' (1.524 m)   Wt 134 lb (60.8 kg)   SpO2 96%   BMI 26.17 kg/m  , BMI Body mass index is 26.17 kg/m. GEN: Well nourished, well developed, in no acute distress  HEENT: normal  Neck: no JVD, carotid bruits, or masses Cardiac: RRR; 2/6 systolic murmur, no rubs, or gallops,bilateral tender leg edema 2+ Respiratory:  clear to auscultation bilaterally, normal work of breathing GI: soft, nontender, nondistended, + BS MS: no deformity or atrophy  Skin: legs are mildly erythematous Neuro:  Strength and sensation are intact Psych: euthymic mood, full affect   EKG:   The ekg ordered today demonstrates paced rhythm   Recent Labs: 10/15/2019: ALT 10; BUN 22; Creat 0.73; Hemoglobin 12.1; Platelets 204; Potassium 4.3; Sodium 141; TSH 2.17   Lipid Panel    Component Value Date/Time   CHOL 158 10/15/2019 0000   CHOL 198 03/19/2017 0000   TRIG 116 10/15/2019 0000   TRIG 130 03/19/2017 0000   HDL 60 10/15/2019 0000   CHOLHDL 2.6 10/15/2019 0000   VLDL 18 08/25/2009 0440   LDLCALC 78 10/15/2019 0000   LDLCALC 111 03/19/2017 0000     Other studies Reviewed: Additional studies/ records that were reviewed today with results demonstrating: 0.73 Cr in June  2021.   ASSESSMENT AND PLAN:  1. Bilateral LE edema: We discussed, elevation of ankles above heart, compression with socks or ACE wraps, and diuretic.  Had difficulty with diuretics due to incontinence. She will try elevation and ACE wraps.  She really wants to avoid diuretics if possible. Hopefully this will help reduce the pain in her legs.  She has the diuretic to use if she does eventually want to try this. 2. Pacer: followed  by Dr. Rayann Heman.  3. Anticoagulated: Coumadin for stroke prevention.  Prior DVT as well.    Current medicines are reviewed at length with the patient today.  The patient concerns regarding her medicines were addressed.  The following changes have been made:  No change  Labs/ tests ordered today include:  No orders of the defined types were placed in this encounter.   Recommend 150 minutes/week of aerobic exercise Low fat, low carb, high fiber diet recommended  Disposition:   FU as needed   Signed, Larae Grooms, MD  03/21/2020 4:28 PM    [facility] [address], [place], [place]  [address] Phone: [phone]; Fax: [phone]

## 2020-03-21 NOTE — Patient Instructions (Signed)
Medication Instructions:  Your physician recommends that you continue on your current medications as directed. Please refer to the Current Medication list given to you today.  *If you need a refill on your cardiac medications before your next appointment, please call your pharmacy*   Lab Work: None   If you have labs (blood work) drawn today and your tests are completely normal, you will receive your results only by: [name] MyChart Message (if you have MyChart) OR . A paper copy in the mail If you have any lab test that is abnormal or we need to change your treatment, we will call you to review the results.   Testing/Procedures: None   Follow-Up: AS NEEDED   Other Instructions Elevate legs and wear ace wraps on your legs to help with swelling

## 2020-03-23 ENCOUNTER — Encounter: Payer: Self-pay | Admitting: Internal Medicine

## 2020-03-23 ENCOUNTER — Non-Acute Institutional Stay: Payer: Medicare Other | Admitting: Internal Medicine

## 2020-03-23 ENCOUNTER — Other Ambulatory Visit: Payer: Self-pay

## 2020-03-23 VITALS — BP 128/70 | HR 82 | Temp 96.8°F | Ht 60.0 in | Wt 136.0 lb

## 2020-03-23 DIAGNOSIS — E782 Mixed hyperlipidemia: Secondary | ICD-10-CM

## 2020-03-23 DIAGNOSIS — R197 Diarrhea, unspecified: Secondary | ICD-10-CM

## 2020-03-23 DIAGNOSIS — G253 Myoclonus: Secondary | ICD-10-CM

## 2020-03-23 DIAGNOSIS — M810 Age-related osteoporosis without current pathological fracture: Secondary | ICD-10-CM | POA: Diagnosis not present

## 2020-03-23 DIAGNOSIS — I480 Paroxysmal atrial fibrillation: Secondary | ICD-10-CM

## 2020-03-23 DIAGNOSIS — R600 Localized edema: Secondary | ICD-10-CM | POA: Diagnosis not present

## 2020-03-23 DIAGNOSIS — E039 Hypothyroidism, unspecified: Secondary | ICD-10-CM | POA: Diagnosis not present

## 2020-03-23 DIAGNOSIS — R4189 Other symptoms and signs involving cognitive functions and awareness: Secondary | ICD-10-CM | POA: Diagnosis not present

## 2020-03-23 NOTE — Progress Notes (Signed)
Location: [place] of Service:  Clinic (12)  Provider:   Code Status:  Goals of Care:  Advanced Directives 10/15/2018  Does Patient Have a Medical Advance Directive? No;Yes  Type of Advance Directive [place]  Does patient want to make changes to medical advance directive? -  Copy of [place] in Chart? Yes - validated most recent copy scanned in chart (See row information)  Would patient like information on creating a medical advance directive? No - Patient declined     Chief Complaint  Patient presents with  . Medical Management of Chronic Issues    Patient returns to the clinic for follow up.     HPI: Patient is a 86 y.o. female seen today for an acute visit for Worsening bilateral LE edema with redness and Pain  Patient has a history of CHB s/p pacemaker, PAF on Coumadin, moderate AS Also has history of cognitive impairment, myoclonic jerks thought to be functional on Zoloft Osteoporosis on Prolia per rheumatology Urinary incontinence Chronic diarrhea record by GI it now on cholestyramine  Came for follow up of her LE edema Bilateral with redness pain . She took Lasix for 1 day and then stopped due to Urinary Incontinence Was seen by Cardiology to do leg Elevation and ted hoses. Patient is doing neither. Has worsening of her pain and swelling.Cannot wear shoes. No Fever. Chest pain or SOB .    Past Medical History:  Diagnosis Date  . Arthritis   . Atrial fibrillation ([place])   . Cataract    removed bilateral  . Clotting disorder ([place])    h/o blood clot left leg  . Dementia ([place])   . Depression   . DVT (deep venous thrombosis) ([place]) 2011  . GERD (gastroesophageal reflux disease)   . HB (heart block)    s/p PPM, most recent generator change 12/11 by JA  . Heart murmur   . Hiatal hernia   . Hyperlipidemia   . Hypertension   . Hypothyroidism   . Osteoporosis   . PTE (post-transplant erythrocytosis)   .  Pulmonary embolism ([place]) 2011   on coumadin  . UTI (lower urinary tract infection)     Past Surgical History:  Procedure Laterality Date  . BLADDER SURGERY     twice  . cataracts     bilateral  . CHOLECYSTECTOMY  09/2010  . COLONOSCOPY    . ESOPHAGOGASTRODUODENOSCOPY    . HEMORRHOID SURGERY    . hysterectomy (other)    . INNER EAR SURGERY    . JOINT REPLACEMENT    . pacemaker     most recent generator 12/11 by JA  . TOTAL KNEE ARTHROPLASTY Left     Allergies  Allergen Reactions  . Alendronate Other (See Comments)    Can not tolerate  . Celebrex [Celecoxib] Other (See Comments)    Can not tolerate  . Meloxicam Other (See Comments)    Avoid   . Myrbetriq [Mirabegron] Other (See Comments)    Can not tolerate  . Norco [Hydrocodone-Acetaminophen] Other (See Comments)    Dizziness and jerking  . Pradaxa [Dabigatran Etexilate Mesylate] Other (See Comments)    Can not tolerate  . Pradaxa [Dabigatran]   . Toviaz [Fesoterodine Fumarate Er] Other (See Comments)    Can not tolerate  . Vesicare [Solifenacin] Other (See Comments)    Can not tolerate    Outpatient Encounter Medications as of 03/23/2020  Medication Sig  . Calcium  Carb-Cholecalciferol (CALCIUM 600+D3) 600-800 MG-UNIT TABS Take 1 tablet by mouth daily.  . cholecalciferol (VITAMIN D) 1000 UNITS tablet Take 1,000 Units by mouth daily.   . cholestyramine light (PREVALITE) 4 g packet Take 1 packet (4 g total) by mouth daily with supper.  . Dextromethorphan-Guaifenesin (MUCUS RELIEF DM PO) Take 1 tablet by mouth daily.  [name] docusate sodium (COLACE) 100 MG capsule Take 100 mg by mouth as needed for mild constipation.   . gabapentin (NEURONTIN) 300 MG capsule TAKE 1 CAPSULE TWICE DAILY  . levothyroxine (SYNTHROID) 50 MCG tablet TAKE 1 TABLET (50 MCG TOTAL) BY MOUTH DAILY BEFORE BREAKFAST.  [name] loratadine (CLARITIN) 10 MG tablet Take 10 mg by mouth daily.   . memantine (NAMENDA) 10 MG tablet TAKE 1 TABLET TWICE DAILY  .  Multiple Vitamins-Minerals (PRESERVISION AREDS 2 PO) Take 1 capsule by mouth 2 (two) times daily.  [name] NITROSTAT 0.4 MG SL tablet Place 0.4 mg under the tongue every 5 (five) minutes as needed for chest pain (MAX 3 TABLETS).   [name] omeprazole (PRILOSEC) 20 MG capsule TAKE 1 CAPSULE TWICE DAILY  . pravastatin (PRAVACHOL) 20 MG tablet TAKE 1 TABLET EVERY DAY  . Probiotic Product (PROBIOTIC DAILY PO) Take 1 capsule by mouth daily.  Orlie Dakin Sodium (SENNA S PO) Take by mouth as needed.   . sertraline (ZOLOFT) 25 MG tablet TAKE 1 TABLET IN THE MORNING, AND 2 TABLETS IN THE EVENING AS DIRECTED. TOTAL 3 TABLETS DAILY.  [name] warfarin (COUMADIN) 5 MG tablet TAKE 1 TABLET DAILY OR AS DIRECTED BY THE COUMADIN CLINIC.  . [DISCONTINUED] colestipol (COLESTID) 5 g granules Take 5 g by mouth daily with supper.   No facility-administered encounter medications on file as of 03/23/2020.    Review of Systems:  Review of Systems  Constitutional: Positive for activity change.  HENT: Negative.   Respiratory: Negative.   Cardiovascular: Positive for leg swelling.  Gastrointestinal: Negative.   Genitourinary: Positive for frequency and urgency.  Musculoskeletal: Negative.   Skin: Negative.   Neurological: Negative.   Psychiatric/Behavioral: Positive for confusion.    Health Maintenance  Topic Date Due  . DEXA SCAN  Never done  . TETANUS/TDAP  02/11/2022  . INFLUENZA VACCINE  Completed  . COVID-19 Vaccine  Completed  . PNA vac Low Risk Adult  Completed    Physical Exam: Vitals:   03/23/20 1539  BP: 128/70  Pulse: 82  Temp: (!) 96.8 F (36 C)  SpO2: 92%  Weight: 136 lb (61.7 kg)  Height: 5' (1.524 m)   Body mass index is 26.56 kg/m. Physical Exam Vitals reviewed.  Constitutional:      Appearance: Normal appearance.  HENT:     Head: Normocephalic.     Nose: Nose normal.     Mouth/Throat:     Mouth: Mucous membranes are moist.     Pharynx: Oropharynx is clear.  Eyes:     Pupils:  Pupils are equal, round, and reactive to light.  Cardiovascular:     Rate and Rhythm: Normal rate. Rhythm irregular.     Pulses: Normal pulses.     Heart sounds: Murmur heard.   Pulmonary:     Effort: Pulmonary effort is normal. No respiratory distress.     Breath sounds: Normal breath sounds.  Abdominal:     General: Abdomen is flat. Bowel sounds are normal.     Palpations: Abdomen is soft.  Musculoskeletal:        General: Swelling present.  Cervical back: Neck supple.     Comments: Has moderate swelling bilateral with redness around both Legs  Skin:    General: Skin is warm.  Neurological:     General: No focal deficit present.     Mental Status: She is alert.  Psychiatric:        Mood and Affect: Mood normal.        Thought Content: Thought content normal.     Labs reviewed: Basic Metabolic Panel: Recent Labs    05/21/19 0714 10/15/19 0000  NA 141 141  K 4.0 4.3  CL 105 104  CO2 27 28  GLUCOSE 90 83  BUN 20 22  CREATININE 0.83 0.73  CALCIUM 9.7 9.7  TSH  --  2.17   Liver Function Tests: Recent Labs    05/21/19 0714 10/15/19 0000  AST 17 15  ALT 12 10  BILITOT 0.4 0.4  PROT 6.3 6.1   No results for input(s): LIPASE, AMYLASE in the last 8760 hours. No results for input(s): AMMONIA in the last 8760 hours. CBC: Recent Labs    05/21/19 0714 10/15/19 0000  WBC 4.9 5.3  NEUTROABS 2,347 3,095  HGB 12.4 12.1  HCT 38.8 37.4  MCV 89.6 92.8  PLT 232 204   Lipid Panel: Recent Labs    10/15/19 0000  CHOL 158  HDL 60  LDLCALC 78  TRIG 116  CHOLHDL 2.6   No results found for: HGBA1C  Procedures since last visit: CUP PACEART REMOTE DEVICE CHECK  Result Date: 03/02/2020 Scheduled remote reviewed. Normal device function.  Atrial arrhythmias detected that were all less than five minutes Next remote 91 days. Kathy Breach, RN, CCDS, [employer]   Assessment/Plan Bilateral leg edema Discussed with patient and her husband that she has to  take lasix as her edema will is getting worse and she get Cellulitis specially with redness They will try again  Will reases in 2 weeks BMP reschedeuled  Other issues Paroxysmal atrial fibrillation (HCC) On Coumadin   Acquired hypothyroidism TSH Normal in 6/21 Mixed hyperlipidemia LDL 78 on Statin Myoclonic jerking Functional Has to continue Zoloft as nothing else helps her [facility] She has h/o Myoclonic Jerks. He has tried different regimes.And now has her onZoloft and Neurontin.He does not think she has NeuroDegenerative disorder and think it is functional Cannot Reduce Zoloft or Neurontin  Age-related osteoporosis without current pathological fracture Prolia by her Rheumatologist Cognitive impairment D/W husband to discontinue Aricept Can continue Namenda MMSE 27/30before Failed her Clock Drawing Recall was 3/3 Chronic Diarrhea Following with GI Will discontinue Aricept for now  Mixed incontinence urge and stress Intolerant to Myrebetiq Does not want to  see urology yet      Labs/tests ordered:  * No order type specified * Next appt:  03/24/2020

## 2020-03-23 NOTE — Patient Instructions (Signed)
Take lasix everyday for 1 week Cancel labs tomorrow Follow up labs in 1 week

## 2020-03-28 DIAGNOSIS — Z23 Encounter for immunization: Secondary | ICD-10-CM | POA: Diagnosis not present

## 2020-03-29 ENCOUNTER — Encounter: Payer: Self-pay | Admitting: Nurse Practitioner

## 2020-03-31 ENCOUNTER — Other Ambulatory Visit: Payer: Self-pay | Admitting: Internal Medicine

## 2020-03-31 ENCOUNTER — Other Ambulatory Visit: Payer: Self-pay

## 2020-03-31 ENCOUNTER — Ambulatory Visit (INDEPENDENT_AMBULATORY_CARE_PROVIDER_SITE_OTHER): Payer: Medicare Other | Admitting: *Deleted

## 2020-03-31 DIAGNOSIS — Z5181 Encounter for therapeutic drug level monitoring: Secondary | ICD-10-CM

## 2020-03-31 DIAGNOSIS — I480 Paroxysmal atrial fibrillation: Secondary | ICD-10-CM | POA: Diagnosis not present

## 2020-03-31 DIAGNOSIS — I4891 Unspecified atrial fibrillation: Secondary | ICD-10-CM | POA: Diagnosis not present

## 2020-03-31 DIAGNOSIS — E039 Hypothyroidism, unspecified: Secondary | ICD-10-CM | POA: Diagnosis not present

## 2020-03-31 LAB — PROTIME-INR
INR: 2.4 — AB (ref 0.9–1.1)
INR: 2.4 — ABNORMAL HIGH
Prothrombin Time: 25 s — ABNORMAL HIGH (ref 9.0–11.5)

## 2020-03-31 NOTE — Patient Instructions (Signed)
Description   Called spoke with pt's husband, advised to have pt continue taking 1 tablet daily except for 1/2 tablet on Mondays. Recheck in 2 weeks. Orders faxed to [facility] [phone].

## 2020-04-01 LAB — COMPLETE METABOLIC PANEL WITH GFR
AG Ratio: 1.8 (calc) (ref 1.0–2.5)
ALT: 11 U/L (ref 6–29)
AST: 15 U/L (ref 10–35)
Albumin: 3.8 g/dL (ref 3.6–5.1)
Alkaline phosphatase (APISO): 79 U/L (ref 37–153)
BUN: 21 mg/dL (ref 7–25)
CO2: 32 mmol/L (ref 20–32)
Calcium: 9.7 mg/dL (ref 8.6–10.4)
Chloride: 104 mmol/L (ref 98–110)
Creat: 0.77 mg/dL (ref 0.60–0.88)
GFR, Est African American: 77 mL/min/{1.73_m2} (ref >=60)
GFR, Est Non African American: 66 mL/min/{1.73_m2} (ref >=60)
Globulin: 2.1 g/dL (calc) (ref 1.9–3.7)
Glucose, Bld: 93 mg/dL (ref 65–99)
Potassium: 4.2 mmol/L (ref 3.5–5.3)
Sodium: 141 mmol/L (ref 135–146)
Total Bilirubin: 0.5 mg/dL (ref 0.2–1.2)
Total Protein: 5.9 g/dL — ABNORMAL LOW (ref 6.1–8.1)

## 2020-04-01 LAB — CBC WITH DIFFERENTIAL/PLATELET
Absolute Monocytes: 521 cells/uL (ref 200–950)
Basophils Absolute: 81 cells/uL (ref 0–200)
Basophils Relative: 1.3 %
Eosinophils Absolute: 112 cells/uL (ref 15–500)
Eosinophils Relative: 1.8 %
HCT: 37.5 % (ref 35.0–45.0)
Hemoglobin: 12.1 g/dL (ref 11.7–15.5)
Lymphs Abs: 1370 cells/uL (ref 850–3900)
MCH: 29.7 pg (ref 27.0–33.0)
MCHC: 32.3 g/dL (ref 32.0–36.0)
MCV: 92.1 fL (ref 80.0–100.0)
MPV: 11.9 fL (ref 7.5–12.5)
Monocytes Relative: 8.4 %
Neutro Abs: 4117 cells/uL (ref 1500–7800)
Neutrophils Relative %: 66.4 %
Platelets: 241 10*3/uL (ref 140–400)
RBC: 4.07 10*6/uL (ref 3.80–5.10)
RDW: 14.1 % (ref 11.0–15.0)
Total Lymphocyte: 22.1 %
WBC: 6.2 10*3/uL (ref 3.8–10.8)

## 2020-04-01 LAB — TSH: TSH: 2.19 mIU/L (ref 0.40–4.50)

## 2020-04-05 ENCOUNTER — Non-Acute Institutional Stay: Payer: Medicare Other | Admitting: Nurse Practitioner

## 2020-04-05 ENCOUNTER — Other Ambulatory Visit: Payer: Self-pay

## 2020-04-05 ENCOUNTER — Encounter: Payer: Self-pay | Admitting: Nurse Practitioner

## 2020-04-05 VITALS — BP 130/60 | HR 88 | Temp 98.0°F | Resp 16 | Wt 135.0 lb

## 2020-04-05 DIAGNOSIS — R609 Edema, unspecified: Secondary | ICD-10-CM

## 2020-04-05 DIAGNOSIS — R197 Diarrhea, unspecified: Secondary | ICD-10-CM

## 2020-04-05 DIAGNOSIS — R4189 Other symptoms and signs involving cognitive functions and awareness: Secondary | ICD-10-CM

## 2020-04-05 DIAGNOSIS — M810 Age-related osteoporosis without current pathological fracture: Secondary | ICD-10-CM

## 2020-04-05 DIAGNOSIS — R259 Unspecified abnormal involuntary movements: Secondary | ICD-10-CM | POA: Diagnosis not present

## 2020-04-05 DIAGNOSIS — K219 Gastro-esophageal reflux disease without esophagitis: Secondary | ICD-10-CM

## 2020-04-05 DIAGNOSIS — R600 Localized edema: Secondary | ICD-10-CM

## 2020-04-05 DIAGNOSIS — N3946 Mixed incontinence: Secondary | ICD-10-CM

## 2020-04-05 DIAGNOSIS — Z86718 Personal history of other venous thrombosis and embolism: Secondary | ICD-10-CM

## 2020-04-05 DIAGNOSIS — E039 Hypothyroidism, unspecified: Secondary | ICD-10-CM | POA: Diagnosis not present

## 2020-04-05 MED ORDER — TORSEMIDE 20 MG PO TABS: 20.0000 mg | ORAL_TABLET | Freq: Every day | ORAL | 2 refills | Status: DC

## 2020-04-05 NOTE — Assessment & Plan Note (Signed)
Cognitive impairment: off Donepezil, takes Memantine. The patient needs higher level of care at [facility]

## 2020-04-05 NOTE — Assessment & Plan Note (Signed)
Age related OP w/o current pathological fracture, taking Prolia per Rheumatology 

## 2020-04-05 NOTE — Assessment & Plan Note (Signed)
GERD, taking Omeprazole.

## 2020-04-05 NOTE — Assessment & Plan Note (Signed)
Hx of Hypothyroidism, takes Levothyroxine, TH 2.19 03/31/20

## 2020-04-05 NOTE — Assessment & Plan Note (Signed)
Chronic diarrhea, better, f/u GI, off Donepezil, taking Cholestyramine.  

## 2020-04-05 NOTE — Progress Notes (Signed)
Location:   Clinic  [facility]   Place of Service:   Clinic [facility] Provider: Marlana Latus NP  Code Status: DNR Goals of Care: [place] Advanced Directives 10/15/2018  Does Patient Have a Medical Advance Directive? No;Yes  Type of Advance Directive [place]  Does patient want to make changes to medical advance directive? -  Copy of [place] in Chart? Yes - validated most recent copy scanned in chart (See row information)  Would patient like information on creating a medical advance directive? No - Patient declined     Chief Complaint  Patient presents with  . Acute Visit    swelling in legs.     HPI: Patient is a 86 y.o. female seen today for an acute visit for persisted swelling in legs, LLE >RLE slightly, Hx of DVT LLE. Saw Cardiology 03/21/20: encourage ankle elevation above heart, compression with ACE wraps,  may need to take Furosemide for swelling legs. Last echoc normal EF 2016. Saw Dr. Lyndel Safe 03/23/20 the patient was encouraged to take Lasix. The patient stated she takes Lasix everyday in the past 2 weeks, no efficacy. Apparently her weight has no change since 03/23/20. The patient denied cough, SOB, sputum production, chest pain/pressure, or palpitation.   Hx of Hypothyroidism, takes Levothyroxine, TH 2.19 03/31/20  Myoclonic jerking functional, s/p neurology evaluation, taking Sertraline, Gabapentin  Age related OP w/o current pathological fracture, taking Prolia per Rheumatology  Cognitive impairment: off Donepezil, takes Memantine.   Chronic diarrhea, better, f/u GI, off Donepezil, taking Cholestyramine.   Urinary incontinence, didn't tolerated Myrbetriq, declined Urology eval.   AS, pacemaker, Hx of DVT on Coumadin  GERD, taking Omeprazole.     Past Medical History:  Diagnosis Date  . Arthritis   . Atrial fibrillation ([place])   . Cataract    removed bilateral  . Clotting disorder ([place])    h/o blood clot left leg  . Dementia ([place])   .  Depression   . DVT (deep venous thrombosis) ([place]) 2011  . GERD (gastroesophageal reflux disease)   . HB (heart block)    s/p PPM, most recent generator change 12/11 by JA  . Heart murmur   . Hiatal hernia   . Hyperlipidemia   . Hypertension   . Hypothyroidism   . Osteoporosis   . PTE (post-transplant erythrocytosis)   . Pulmonary embolism ([place]) 2011   on coumadin  . UTI (lower urinary tract infection)     Past Surgical History:  Procedure Laterality Date  . BLADDER SURGERY     twice  . cataracts     bilateral  . CHOLECYSTECTOMY  09/2010  . COLONOSCOPY    . ESOPHAGOGASTRODUODENOSCOPY    . HEMORRHOID SURGERY    . hysterectomy (other)    . INNER EAR SURGERY    . JOINT REPLACEMENT    . pacemaker     most recent generator 12/11 by JA  . TOTAL KNEE ARTHROPLASTY Left     Allergies  Allergen Reactions  . Alendronate Other (See Comments)    Can not tolerate  . Celebrex [Celecoxib] Other (See Comments)    Can not tolerate  . Meloxicam Other (See Comments)    Avoid   . Myrbetriq [Mirabegron] Other (See Comments)    Can not tolerate  . Norco [Hydrocodone-Acetaminophen] Other (See Comments)    Dizziness and jerking  . Pradaxa [Dabigatran Etexilate Mesylate] Other (See Comments)    Can not tolerate  . Pradaxa [Dabigatran]   .  Toviaz [Fesoterodine Fumarate Er] Other (See Comments)    Can not tolerate  . Vesicare [Solifenacin] Other (See Comments)    Can not tolerate    Allergies as of 04/05/2020      Reactions   Alendronate Other (See Comments)   Can not tolerate   Celebrex [celecoxib] Other (See Comments)   Can not tolerate   Meloxicam Other (See Comments)   Avoid   Myrbetriq [mirabegron] Other (See Comments)   Can not tolerate   Norco [hydrocodone-acetaminophen] Other (See Comments)   Dizziness and jerking   Pradaxa [dabigatran Etexilate Mesylate] Other (See Comments)   Can not tolerate   Pradaxa [dabigatran]    Toviaz [fesoterodine Fumarate Er] Other (See  Comments)   Can not tolerate   Vesicare [solifenacin] Other (See Comments)   Can not tolerate      Medication List       Accurate as of April 05, 2020  3:06 PM. If you have any questions, ask your nurse or doctor.        STOP taking these medications   furosemide 20 MG tablet Commonly known as: LASIX Stopped by: Chauncy Mangiaracina X Mike Hamre, NP     TAKE these medications   Calcium 600+D3 600-800 MG-UNIT Tabs Generic drug: Calcium Carb-Cholecalciferol Take 1 tablet by mouth daily.   cholecalciferol 1000 units tablet Commonly known as: VITAMIN D Take 1,000 Units by mouth daily.   cholestyramine light 4 g packet Commonly known as: Prevalite Take 1 packet (4 g total) by mouth daily with supper.   Colace 100 MG capsule Generic drug: docusate sodium Take 100 mg by mouth as needed for mild constipation.   gabapentin 300 MG capsule Commonly known as: NEURONTIN TAKE 1 CAPSULE TWICE DAILY   levothyroxine 50 MCG tablet Commonly known as: SYNTHROID TAKE 1 TABLET (50 MCG TOTAL) BY MOUTH DAILY BEFORE BREAKFAST.   loratadine 10 MG tablet Commonly known as: CLARITIN Take 10 mg by mouth daily.   memantine 10 MG tablet Commonly known as: NAMENDA TAKE 1 TABLET TWICE DAILY   MUCUS RELIEF DM PO Take 1 tablet by mouth daily.   Nitrostat 0.4 MG SL tablet Generic drug: nitroGLYCERIN Place 0.4 mg under the tongue every 5 (five) minutes as needed for chest pain (MAX 3 TABLETS).   omeprazole 20 MG capsule Commonly known as: PRILOSEC TAKE 1 CAPSULE TWICE DAILY   potassium chloride SA 20 MEQ tablet Commonly known as: KLOR-CON Take 20 mEq by mouth once.   pravastatin 20 MG tablet Commonly known as: PRAVACHOL TAKE 1 TABLET EVERY DAY   PRESERVISION AREDS 2 PO Take 1 capsule by mouth 2 (two) times daily.   PROBIOTIC DAILY PO Take 1 capsule by mouth daily.   SENNA S PO Take by mouth as needed.   sertraline 25 MG tablet Commonly known as: ZOLOFT TAKE 1 TABLET IN THE MORNING, AND 2  TABLETS IN THE EVENING AS DIRECTED. TOTAL 3 TABLETS DAILY.   torsemide 20 MG tablet Commonly known as: Demadex Take 1 tablet (20 mg total) by mouth daily. Started by: Yarelis Ambrosino X Lorene Klimas, NP   warfarin 5 MG tablet Commonly known as: COUMADIN Take as directed by the anticoagulation clinic. If you are unsure how to take this medication, talk to your nurse or doctor. Original instructions: TAKE 1 TABLET DAILY OR AS DIRECTED BY THE COUMADIN CLINIC.       Review of Systems:  Review of Systems  Constitutional: Negative for activity change, appetite change, fever and unexpected weight change.  HENT: Positive for hearing loss. Negative for congestion, trouble swallowing and voice change.   Eyes: Negative for visual disturbance.       Low vision  Respiratory: Negative for cough, shortness of breath and wheezing.   Cardiovascular: Positive for leg swelling.  Gastrointestinal: Negative for abdominal pain, constipation and nausea.  Genitourinary: Positive for frequency and urgency. Negative for dysuria.       Incontinent of urine, frequency, urgency at times.   Musculoskeletal: Positive for gait problem.  Skin:       Mild erythema BLE  Neurological: Negative for facial asymmetry, speech difficulty, weakness and headaches.       Memory loss  Psychiatric/Behavioral: Negative for behavioral problems, confusion, hallucinations and sleep disturbance. The patient is not nervous/anxious.     Health Maintenance  Topic Date Due  . DEXA SCAN  Never done  . TETANUS/TDAP  02/11/2022  . INFLUENZA VACCINE  Completed  . COVID-19 Vaccine  Completed  . PNA vac Low Risk Adult  Completed    Physical Exam: Vitals:   04/05/20 1342  BP: 130/60  Pulse: 88  Resp: 16  Temp: 98 F (36.7 C)  SpO2: 96%  Weight: 135 lb (61.2 kg)   Body mass index is 26.37 kg/m. Physical Exam Vitals reviewed.  Constitutional:      General: She is not in acute distress.    Appearance: Normal appearance. She is not  ill-appearing or toxic-appearing.  HENT:     Head: Normocephalic and atraumatic.     Nose: Nose normal.     Mouth/Throat:     Mouth: Mucous membranes are moist.  Eyes:     Extraocular Movements: Extraocular movements intact.     Conjunctiva/sclera: Conjunctivae normal.     Pupils: Pupils are equal, round, and reactive to light.  Cardiovascular:     Rate and Rhythm: Normal rate and regular rhythm.     Heart sounds: Murmur heard.      Comments: pacer Pulmonary:     Effort: Pulmonary effort is normal.     Breath sounds: No wheezing, rhonchi or rales.  Abdominal:     General: Bowel sounds are normal. There is no distension.     Tenderness: There is no abdominal tenderness. There is no right CVA tenderness, left CVA tenderness, guarding or rebound.  Musculoskeletal:     Cervical back: Normal range of motion and neck supple.     Right lower leg: Edema present.     Left lower leg: Edema present.     Comments: 2-3+ edema BLE  Skin:    General: Skin is warm and dry.     Findings: Erythema present.     Comments: Mild erythema BLE  Neurological:     General: No focal deficit present.     Mental Status: She is alert. Mental status is at baseline.     Motor: No weakness.     Coordination: Coordination normal.     Gait: Gait abnormal.     Comments: Oriented to person, place. Not sure about time.   Psychiatric:        Mood and Affect: Mood normal.        Behavior: Behavior normal.        Thought Content: Thought content normal.        Judgment: Judgment normal.     Labs reviewed: Basic Metabolic Panel: Recent Labs    05/21/19 0714 10/15/19 0000 03/31/20 0820  NA 141 141 141  K 4.0 4.3 4.2  CL  105 104 104  CO2 27 28 32  GLUCOSE 90 83 93  BUN 20 22 21   CREATININE 0.83 0.73 0.77  CALCIUM 9.7 9.7 9.7  TSH  --  2.17 2.19   Liver Function Tests: Recent Labs    05/21/19 0714 10/15/19 0000 03/31/20 0820  AST 17 15 15   ALT 12 10 11   BILITOT 0.4 0.4 0.5  PROT 6.3 6.1  5.9*   No results for input(s): LIPASE, AMYLASE in the last 8760 hours. No results for input(s): AMMONIA in the last 8760 hours. CBC: Recent Labs    05/21/19 0714 10/15/19 0000 03/31/20 0820  WBC 4.9 5.3 6.2  NEUTROABS 2,347 3,095 4,117  HGB 12.4 12.1 12.1  HCT 38.8 37.4 37.5  MCV 89.6 92.8 92.1  PLT 232 204 241   Lipid Panel: Recent Labs    10/15/19 0000  CHOL 158  HDL 60  LDLCALC 78  TRIG 116  CHOLHDL 2.6   No results found for: HGBA1C  Procedures since last visit: No results found.  Assessment/Plan Edema persisted swelling in legs, LLE >RLE slightly, Hx of DVT LLE. Saw Cardiology 03/21/20: encourage ankle elevation above heart, compression with ACE wraps,  may need to take Furosemide for swelling legs. Last echoc normal EF 2016. Saw Dr. Lyndel Safe 03/23/20 the patient was encouraged to take Lasix. The patient stated she takes Lasix everyday in the past 2 weeks, no efficacy. Apparently her weight has no change since 03/23/20. The patient denied cough, SOB, sputum production, chest pain/pressure, or palpitation.  ? Developing CHF (AS, pacer)vs peripheral venous insufficiency(Hx of DVT LLE), failed Laxis. Will try Torsemide 54m qd, Kcl 248m qd, f/u BMP/eGFR 04/12/20.   Acquired hypothyroidism Hx of Hypothyroidism, takes Levothyroxine, TH 2.19 03/31/20   Involuntary movements Myoclonic jerking functional, s/p neurology evaluation, taking Sertraline, Gabapentin  Osteoporosis Age related OP w/o current pathological fracture, taking Prolia per Rheumatology   Cognitive impairment Cognitive impairment: off Donepezil, takes Memantine. The patient needs higher level of care at [facility]   Diarrhea in adult patient Chronic diarrhea, better, f/u GI, off Donepezil, taking Cholestyramine.    Mixed incontinence urge and stress Urinary incontinence, didn't tolerated Myrbetriq, declined Urology eval.    History of DVT (deep vein thrombosis) AS, pacemaker, Hx of DVT on  Coumadin  GERD (gastroesophageal reflux disease) GERD, taking Omeprazole.        Labs/tests ordered:  BMP/eGFR one week  Next appt:  04/20/2020

## 2020-04-05 NOTE — Assessment & Plan Note (Signed)
Myoclonic jerking functional, s/p neurology evaluation, taking Sertraline, Gabapentin

## 2020-04-05 NOTE — Assessment & Plan Note (Signed)
persisted swelling in legs, LLE >RLE slightly, Hx of DVT LLE. Saw Cardiology 03/21/20: encourage ankle elevation above heart, compression with ACE wraps,  may need to take Furosemide for swelling legs. Last echoc normal EF 2016. Saw Dr. Lyndel Safe 03/23/20 the patient was encouraged to take Lasix. The patient stated she takes Lasix everyday in the past 2 weeks, no efficacy. Apparently her weight has no change since 03/23/20. The patient denied cough, SOB, sputum production, chest pain/pressure, or palpitation.  ? Developing CHF (AS, pacer)vs peripheral venous insufficiency(Hx of DVT LLE), failed Laxis. Will try Torsemide 67m qd, Kcl 22m qd, f/u BMP/eGFR 04/12/20.

## 2020-04-05 NOTE — Assessment & Plan Note (Signed)
Urinary incontinence, didn't tolerated Myrbetriq, declined Urology eval. 

## 2020-04-05 NOTE — Assessment & Plan Note (Addendum)
AS, pacemaker, Hx of DVT on Coumadin

## 2020-04-14 ENCOUNTER — Telehealth: Payer: Self-pay | Admitting: Interventional Cardiology

## 2020-04-14 DIAGNOSIS — Z961 Presence of intraocular lens: Secondary | ICD-10-CM | POA: Diagnosis not present

## 2020-04-14 DIAGNOSIS — H353231 Exudative age-related macular degeneration, bilateral, with active choroidal neovascularization: Secondary | ICD-10-CM | POA: Diagnosis not present

## 2020-04-14 NOTE — Telephone Encounter (Signed)
Called and spoke with patient's husband. They forgot about labs. Didn't have it on his calendar. They will go Monday. Spoke to [place] and let her know.

## 2020-04-14 NOTE — Telephone Encounter (Signed)
Robin with [facility] is calling to make Dr. Irish Lack aware that the patient did not show up for her INR today.

## 2020-04-18 ENCOUNTER — Other Ambulatory Visit: Payer: Self-pay | Admitting: Internal Medicine

## 2020-04-18 ENCOUNTER — Encounter: Payer: Self-pay | Admitting: Internal Medicine

## 2020-04-18 ENCOUNTER — Ambulatory Visit (INDEPENDENT_AMBULATORY_CARE_PROVIDER_SITE_OTHER): Payer: Medicare Other | Admitting: Internal Medicine

## 2020-04-18 ENCOUNTER — Ambulatory Visit (INDEPENDENT_AMBULATORY_CARE_PROVIDER_SITE_OTHER): Payer: Medicare Other | Admitting: Cardiovascular Disease

## 2020-04-18 VITALS — BP 120/60 | HR 84 | Ht 59.0 in | Wt 137.0 lb

## 2020-04-18 DIAGNOSIS — R159 Full incontinence of feces: Secondary | ICD-10-CM

## 2020-04-18 DIAGNOSIS — Z5181 Encounter for therapeutic drug level monitoring: Secondary | ICD-10-CM | POA: Diagnosis not present

## 2020-04-18 DIAGNOSIS — R152 Fecal urgency: Secondary | ICD-10-CM | POA: Diagnosis not present

## 2020-04-18 DIAGNOSIS — K529 Noninfective gastroenteritis and colitis, unspecified: Secondary | ICD-10-CM

## 2020-04-18 DIAGNOSIS — G8929 Other chronic pain: Secondary | ICD-10-CM

## 2020-04-18 DIAGNOSIS — R1031 Right lower quadrant pain: Secondary | ICD-10-CM | POA: Diagnosis not present

## 2020-04-18 DIAGNOSIS — I4891 Unspecified atrial fibrillation: Secondary | ICD-10-CM | POA: Diagnosis not present

## 2020-04-18 DIAGNOSIS — R1032 Left lower quadrant pain: Secondary | ICD-10-CM

## 2020-04-18 LAB — PROTIME-INR
INR: 2.2 — AB (ref 0.9–1.1)
INR: 2.2 — ABNORMAL HIGH
Prothrombin Time: 22.2 s — ABNORMAL HIGH (ref 9.0–11.5)

## 2020-04-18 NOTE — Patient Instructions (Addendum)
If the cholestyramine is causing constipation try to cut the dosage down to half a packet with supper. If you have questions call us back.  Dr [name] said it makes sense to get some help around the house.  Follow up with Dr [name] as needed.   I appreciate the opportunity to care for you. Silvano Rusk, MD, [facility]

## 2020-04-18 NOTE — Progress Notes (Signed)
[name] 86 y.o. [birth year] [id number]  Assessment & Plan:   Encounter Diagnoses  Name Primary?  . Chronic diarrhea Yes  . Incontinence of feces with fecal urgency   . Abdominal pain, chronic, bilateral lower quadrant     She is improved the best I can tell.  History is a little bit difficult her son-in-law helps husband not here she has dementia.  I do not think there is anything serious going on I think it is a combination of a lax pelvic floor, IBS-like problems with the possibility of some underlying inflammatory condition but given her age we have decided on avoiding colonoscopy.  Plan is to continue the cholestyramine she may reduce that (message sent to her husband) to half a dose at 2 g each day if she is excessively constipated.  Discussion with patient and son-in-law today.  I explained that her husband can certainly call back and speak to me, he still is very sharp at 35 (she is about to be 89) but physically is harder for him to travel to the doctor.  They are resisting moving to a different level of care due to cost.  I would agree with what I have seen in the rest of the chart that increased help is necessary for both, she has chronic lower extremity edema that is bothering her but unable to apply Ace wraps herself and her husband cannot do it as well.  She will see me as needed otherwise.  I appreciate the opportunity to care for this patient. CC: [name], MD   Subjective:   Chief Complaint: Follow-up of chronic diarrhea  HPI Patient is a 86 year old white woman here with her son-in-law for follow-up of chronic diarrhea and fecal incontinence problems.  She had an elevated fecal calprotectin I treated her with budesonide and that did not make a difference.  At last visit in October I prescribed colestipol 5 g with supper which was switched to cholestyramine 4 g due to formulary coverage or lack of availability of the colestipol.  The report is  that she is improved but having alternating constipation and diarrhea. Sore and pain in LQ Still sprays Does not recall incont but demented   son Wt Readings from Last 3 Encounters:  04/18/20 137 lb (62.1 kg)  04/05/20 135 lb (61.2 kg)  03/23/20 136 lb (61.7 kg)    Allergies  Allergen Reactions  . Alendronate Other (See Comments)    Can not tolerate  . Celebrex [Celecoxib] Other (See Comments)    Can not tolerate  . Meloxicam Other (See Comments)    Avoid   . Myrbetriq [Mirabegron] Other (See Comments)    Can not tolerate  . Norco [Hydrocodone-Acetaminophen] Other (See Comments)    Dizziness and jerking  . Pradaxa [Dabigatran Etexilate Mesylate] Other (See Comments)    Can not tolerate  . Pradaxa [Dabigatran]   . Toviaz [Fesoterodine Fumarate Er] Other (See Comments)    Can not tolerate  . Vesicare [Solifenacin] Other (See Comments)    Can not tolerate   Current Meds  Medication Sig  . Calcium Carb-Cholecalciferol (CALCIUM 600+D3) 600-800 MG-UNIT TABS Take 1 tablet by mouth daily.  . cholecalciferol (VITAMIN D) 1000 UNITS tablet Take 1,000 Units by mouth daily.   [name] Dextromethorphan-Guaifenesin (MUCUS RELIEF DM PO) Take 1 tablet by mouth daily.  [name] gabapentin (NEURONTIN) 300 MG capsule TAKE 1 CAPSULE TWICE DAILY  . levothyroxine (SYNTHROID) 50 MCG tablet TAKE 1 TABLET (50 MCG TOTAL) BY  MOUTH DAILY BEFORE BREAKFAST.  [name] loratadine (CLARITIN) 10 MG tablet Take 10 mg by mouth daily.   . memantine (NAMENDA) 10 MG tablet TAKE 1 TABLET TWICE DAILY  . Multiple Vitamins-Minerals (PRESERVISION AREDS 2 PO) Take 1 capsule by mouth 2 (two) times daily.  [name] NITROSTAT 0.4 MG SL tablet Place 0.4 mg under the tongue every 5 (five) minutes as needed for chest pain (MAX 3 TABLETS).   [name] omeprazole (PRILOSEC) 20 MG capsule TAKE 1 CAPSULE TWICE DAILY  . pravastatin (PRAVACHOL) 20 MG tablet TAKE 1 TABLET EVERY DAY  . Sennosides-Docusate Sodium (SENNA S PO) Take by mouth as needed.   .  sertraline (ZOLOFT) 25 MG tablet TAKE 1 TABLET IN THE MORNING, AND 2 TABLETS IN THE EVENING AS DIRECTED. TOTAL 3 TABLETS DAILY.  [name] warfarin (COUMADIN) 5 MG tablet TAKE 1 TABLET DAILY OR AS DIRECTED BY THE COUMADIN CLINIC.   Past Medical History:  Diagnosis Date  . Arthritis   . Atrial fibrillation ([place])   . Cataract    removed bilateral  . Clotting disorder ([place])    h/o blood clot left leg  . Dementia ([place])   . Depression   . DVT (deep venous thrombosis) ([place]) 2011  . GERD (gastroesophageal reflux disease)   . HB (heart block)    s/p PPM, most recent generator change 12/11 by JA  . Heart murmur   . Hiatal hernia   . Hyperlipidemia   . Hypertension   . Hypothyroidism   . Osteoporosis   . PTE (post-transplant erythrocytosis)   . Pulmonary embolism ([place]) 2011   on coumadin  . UTI (lower urinary tract infection)    Past Surgical History:  Procedure Laterality Date  . BLADDER SURGERY     twice  . cataracts     bilateral  . CHOLECYSTECTOMY  09/2010  . COLONOSCOPY    . ESOPHAGOGASTRODUODENOSCOPY    . HEMORRHOID SURGERY    . hysterectomy (other)    . INNER EAR SURGERY    . JOINT REPLACEMENT    . pacemaker     most recent generator 12/11 by JA  . TOTAL KNEE ARTHROPLASTY Left    Social History   Social History Narrative   Tobacco use, amount per day now: 0      Past tobacco use, amount per day: 0      How many years did you use tobacco: 0      Alcohol use (drinks per week): 0      Diet:      Do you drink/eat things with caffeine? Yes      Marital status: Married            What year were you married? 1946      Do you live in a house, apartment, assisted living, condo, trailer? Apartment       Is it one or more stories? 1      How many persons live in your home? 2      Do you have any pets in your home? 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 Yes            How often? 3-4 times a week       Do you have a living will? Yes       Do you have a DNR form?            If not,  do you want to discuss one?      Do you have signed POA/HPOA forms? Yes             family history includes Arthritis in her father and mother; Breast cancer in her sister; Heart disease in her father and mother; Liver cancer in her brother; Stomach cancer in her brother and brother; Stroke in her father.   Review of Systems As per HPI, memory disturbance persists/worsens  Objective:   Physical Exam BP 120/60 (BP Location: Left Arm, Patient Position: Sitting, Cuff Size: Normal)   Pulse 84   Ht 4\' 11"  (1.499 m)   Wt 137 lb (62.1 kg)   BMI 27.67 kg/m   Up on exam table with assist, abd benign soft mildly tender RLQ

## 2020-04-18 NOTE — Patient Instructions (Signed)
Description   Called spoke with pt's husband, advised to have pt continue taking 1 tablet daily except for 1/2 tablet on Mondays. Recheck in 2 weeks. Orders faxed to [facility] [phone].

## 2020-04-20 ENCOUNTER — Encounter: Payer: Self-pay | Admitting: Internal Medicine

## 2020-04-20 ENCOUNTER — Other Ambulatory Visit: Payer: Self-pay

## 2020-04-20 ENCOUNTER — Non-Acute Institutional Stay: Payer: Medicare Other | Admitting: Internal Medicine

## 2020-04-20 VITALS — BP 108/60 | HR 93 | Temp 96.7°F | Ht 59.0 in | Wt 139.0 lb

## 2020-04-20 DIAGNOSIS — R197 Diarrhea, unspecified: Secondary | ICD-10-CM

## 2020-04-20 DIAGNOSIS — R4189 Other symptoms and signs involving cognitive functions and awareness: Secondary | ICD-10-CM

## 2020-04-20 DIAGNOSIS — R600 Localized edema: Secondary | ICD-10-CM | POA: Diagnosis not present

## 2020-04-20 DIAGNOSIS — I350 Nonrheumatic aortic (valve) stenosis: Secondary | ICD-10-CM | POA: Diagnosis not present

## 2020-04-20 DIAGNOSIS — M542 Cervicalgia: Secondary | ICD-10-CM

## 2020-04-20 DIAGNOSIS — M810 Age-related osteoporosis without current pathological fracture: Secondary | ICD-10-CM | POA: Diagnosis not present

## 2020-04-20 DIAGNOSIS — N3946 Mixed incontinence: Secondary | ICD-10-CM

## 2020-04-20 DIAGNOSIS — E039 Hypothyroidism, unspecified: Secondary | ICD-10-CM

## 2020-04-20 DIAGNOSIS — I480 Paroxysmal atrial fibrillation: Secondary | ICD-10-CM | POA: Diagnosis not present

## 2020-04-20 DIAGNOSIS — G253 Myoclonus: Secondary | ICD-10-CM | POA: Diagnosis not present

## 2020-04-20 DIAGNOSIS — E782 Mixed hyperlipidemia: Secondary | ICD-10-CM

## 2020-04-20 DIAGNOSIS — F339 Major depressive disorder, recurrent, unspecified: Secondary | ICD-10-CM | POA: Diagnosis not present

## 2020-04-20 NOTE — Progress Notes (Signed)
Location: [place] of Service:  Clinic (12)  Provider:   Code Status:  Goals of Care:  Advanced Directives 10/15/2018  Does Patient Have a Medical Advance Directive? No;Yes  Type of Advance Directive [place]  Does patient want to make changes to medical advance directive? -  Copy of [place] in Chart? Yes - validated most recent copy scanned in chart (See row information)  Would patient like information on creating a medical advance directive? No - Patient declined     Chief Complaint  Patient presents with  . Medical Management of Chronic Issues    Patient returns to the clinic for her 6 month follow up. Patient complains of exaustion, stillness of her neck and swollen legs.     HPI: Patient is a 86 y.o. female seen today for an acute visit for lower extremity edema  Patient has a history of CHB s/p pacemaker,  PAF on Coumadin, moderate AS Also has history of cognitive impairment, myoclonic jerks thought to be functional on Zoloft Osteoporosis on Prolia per rheumatology Urinary incontinence Chronic diarrhea followed by GI it now on cholestyramine  Lower extremity edema Recent worsening was started on Lasix did no response.  Was changed to [facility] by [facility] on last visit.  The legs look little better though her weight has not changed much. She is not taking the potassium with.. Neck spasms Complaining of pain in her neck especially when she tries to to do move side-by-side.  Can do extension and flexion Also complaining of fatigue Continues to have lower abdominal pain with diarrhea alternating with constipation.  Patient lives with her husband who is 66 years old.  They both are getting more frail but refused to move to assisted living   Past Medical History:  Diagnosis Date  . Arthritis   . Atrial fibrillation ([place])   . Cataract    removed bilateral  . Clotting disorder ([place])    h/o blood clot left leg  .  Dementia ([place])   . Depression   . DVT (deep venous thrombosis) ([place]) 2011  . GERD (gastroesophageal reflux disease)   . HB (heart block)    s/p PPM, most recent generator change 12/11 by JA  . Heart murmur   . Hiatal hernia   . Hyperlipidemia   . Hypertension   . Hypothyroidism   . Osteoporosis   . PTE (post-transplant erythrocytosis)   . Pulmonary embolism ([place]) 2011   on coumadin  . UTI (lower urinary tract infection)     Past Surgical History:  Procedure Laterality Date  . BLADDER SURGERY     twice  . cataracts     bilateral  . CHOLECYSTECTOMY  09/2010  . COLONOSCOPY    . ESOPHAGOGASTRODUODENOSCOPY    . HEMORRHOID SURGERY    . hysterectomy (other)    . INNER EAR SURGERY    . JOINT REPLACEMENT    . pacemaker     most recent generator 12/11 by JA  . TOTAL KNEE ARTHROPLASTY Left     Allergies  Allergen Reactions  . Alendronate Other (See Comments)    Can not tolerate  . Celebrex [Celecoxib] Other (See Comments)    Can not tolerate  . Meloxicam Other (See Comments)    Avoid   . Myrbetriq [Mirabegron] Other (See Comments)    Can not tolerate  . Norco [Hydrocodone-Acetaminophen] Other (See Comments)    Dizziness and jerking  . Pradaxa [Dabigatran Etexilate Mesylate]  Other (See Comments)    Can not tolerate  . Pradaxa [Dabigatran]   . Toviaz [Fesoterodine Fumarate Er] Other (See Comments)    Can not tolerate  . Vesicare [Solifenacin] Other (See Comments)    Can not tolerate    Outpatient Encounter Medications as of 04/20/2020  Medication Sig  . Calcium Carb-Cholecalciferol (CALCIUM 600+D3) 600-800 MG-UNIT TABS Take 1 tablet by mouth daily.  . cholecalciferol (VITAMIN D) 1000 UNITS tablet Take 1,000 Units by mouth daily.   . cholestyramine light (PREVALITE) 4 g packet Take 1 packet (4 g total) by mouth daily with supper.  . Dextromethorphan-Guaifenesin (MUCUS RELIEF DM PO) Take 1 tablet by mouth daily.  [name] gabapentin (NEURONTIN) 300 MG capsule TAKE 1 CAPSULE  TWICE DAILY  . levothyroxine (SYNTHROID) 50 MCG tablet TAKE 1 TABLET (50 MCG TOTAL) BY MOUTH DAILY BEFORE BREAKFAST.  [name] loratadine (CLARITIN) 10 MG tablet Take 10 mg by mouth daily.   . memantine (NAMENDA) 10 MG tablet TAKE 1 TABLET TWICE DAILY  . Multiple Vitamins-Minerals (PRESERVISION AREDS 2 PO) Take 1 capsule by mouth 2 (two) times daily.  [name] NITROSTAT 0.4 MG SL tablet Place 0.4 mg under the tongue every 5 (five) minutes as needed for chest pain (MAX 3 TABLETS).   [name] omeprazole (PRILOSEC) 20 MG capsule TAKE 1 CAPSULE TWICE DAILY  . pravastatin (PRAVACHOL) 20 MG tablet TAKE 1 TABLET EVERY DAY  . Sennosides-Docusate Sodium (SENNA S PO) Take by mouth as needed.   . sertraline (ZOLOFT) 25 MG tablet TAKE 1 TABLET IN THE MORNING, AND 2 TABLETS IN THE EVENING AS DIRECTED. TOTAL 3 TABLETS DAILY.  [name] warfarin (COUMADIN) 5 MG tablet TAKE 1 TABLET DAILY OR AS DIRECTED BY THE COUMADIN CLINIC.  . [DISCONTINUED] colestipol (COLESTID) 5 g granules Take 5 g by mouth daily with supper.  . [DISCONTINUED] potassium chloride SA (KLOR-CON) 20 MEQ tablet Take 20 mEq by mouth once. (Patient not taking: Reported on 04/18/2020)  . [DISCONTINUED] torsemide (DEMADEX) 20 MG tablet Take 1 tablet (20 mg total) by mouth daily. (Patient not taking: Reported on 04/18/2020)   No facility-administered encounter medications on file as of 04/20/2020.    Review of Systems:  Review of Systems  Constitutional: Positive for activity change.  HENT: Negative.   Respiratory: Negative.   Cardiovascular: Positive for leg swelling.  Gastrointestinal: Positive for abdominal pain, constipation and diarrhea.  Genitourinary: Positive for frequency and urgency.  Musculoskeletal: Positive for gait problem and neck pain.  Skin: Negative.   Neurological: Positive for weakness.  Psychiatric/Behavioral: Positive for sleep disturbance.    Health Maintenance  Topic Date Due  . DEXA SCAN  Never done  . TETANUS/TDAP  02/11/2022  . INFLUENZA  VACCINE  Completed  . COVID-19 Vaccine  Completed  . PNA vac Low Risk Adult  Completed    Physical Exam: Vitals:   04/20/20 1414  BP: 108/60  Pulse: 93  Temp: (!) 96.7 F (35.9 C)  SpO2: 94%  Weight: 139 lb (63 kg)  Height: 4\' 11"  (1.499 m)   Body mass index is 28.07 kg/m. Physical Exam Vitals reviewed.  Constitutional:      Appearance: Normal appearance.  HENT:     Head: Normocephalic.     Nose: Nose normal.     Mouth/Throat:     Mouth: Mucous membranes are moist.     Pharynx: Oropharynx is clear.  Eyes:     Pupils: Pupils are equal, round, and reactive to light.  Neck:     Comments:  Not Rigid c/o Pain when moving it side to side Cardiovascular:     Rate and Rhythm: Normal rate. Rhythm irregular.     Pulses: Normal pulses.     Heart sounds: Murmur heard.   Pulmonary:     Effort: Pulmonary effort is normal.     Breath sounds: Normal breath sounds.  Abdominal:     General: Abdomen is flat.     Palpations: Abdomen is soft.     Comments: C/o Pain in lower Abdomen  Musculoskeletal:     Cervical back: Neck supple.     Comments: Moderate Edema  Skin:    General: Skin is warm.  Neurological:     General: No focal deficit present.     Mental Status: She is alert.  Psychiatric:        Mood and Affect: Mood normal.        Thought Content: Thought content normal.     Labs reviewed: Basic Metabolic Panel: Recent Labs    05/21/19 0714 10/15/19 0000 03/31/20 0820  NA 141 141 141  K 4.0 4.3 4.2  CL 105 104 104  CO2 27 28 32  GLUCOSE 90 83 93  BUN 20 22 21   CREATININE 0.83 0.73 0.77  CALCIUM 9.7 9.7 9.7  TSH  --  2.17 2.19   Liver Function Tests: Recent Labs    05/21/19 0714 10/15/19 0000 03/31/20 0820  AST 17 15 15   ALT 12 10 11   BILITOT 0.4 0.4 0.5  PROT 6.3 6.1 5.9*   No results for input(s): LIPASE, AMYLASE in the last 8760 hours. No results for input(s): AMMONIA in the last 8760 hours. CBC: Recent Labs    05/21/19 0714 10/15/19 0000  03/31/20 0820  WBC 4.9 5.3 6.2  NEUTROABS 2,347 3,095 4,117  HGB 12.4 12.1 12.1  HCT 38.8 37.4 37.5  MCV 89.6 92.8 92.1  PLT 232 204 241   Lipid Panel: Recent Labs    10/15/19 0000  CHOL 158  HDL 60  LDLCALC 78  TRIG 116  CHOLHDL 2.6   No results found for: HGBA1C  Procedures since last visit: No results found.  Assessment/Plan  Bilateral leg edema Continue Demadex Again d/w Patient and husband to not stop it. Also Continue to take Potassium Repeat BMP She has been not coming for her Blood work  Diarrhea in adult patient with Lower abdominal Pain Not taking Cholestyramine anymore Is going to use it PRN Follows with GI  Neck Pain Can use Tylenol Right now PRN If not better consider imaging Paroxysmal atrial fibrillation (HCC) On Coumadin  Acquired hypothyroidism TSh Normal in 6/21  Age-related osteoporosis without current pathological fracture On Prolia by Rheumatologist Cognitive impairment Discontinue Aricept Only on Namenda now MMSE 27/30before Failed her Clock Drawing Recall was 3/3  Other issues Mixed incontinence urge and stress Intolerant to Myrebetiq Does not want tosee urology yet  Mixed hyperlipidemia LDL 78 on Statin Myoclonic jerking Has to continue Zoloft as nothing else helps her [facility] She has h/o Myoclonic Jerks. He has tried different regimes.And now has her onZoloft and Neurontin.He does not think she has NeuroDegenerative disorder and think it is functional Cannot Reduce Zoloft or Neurontin Moderate aortic stenosis Follows with Cardiology      Labs/tests ordered:  * No order type specified * Next appt:  04/25/2020

## 2020-04-25 ENCOUNTER — Other Ambulatory Visit: Payer: Self-pay | Admitting: Internal Medicine

## 2020-04-25 ENCOUNTER — Other Ambulatory Visit: Payer: Self-pay

## 2020-04-25 DIAGNOSIS — E039 Hypothyroidism, unspecified: Secondary | ICD-10-CM | POA: Diagnosis not present

## 2020-04-25 DIAGNOSIS — R600 Localized edema: Secondary | ICD-10-CM

## 2020-04-26 LAB — BASIC METABOLIC PANEL WITH GFR
BUN/Creatinine Ratio: 22 (calc) (ref 6–22)
BUN: 21 mg/dL (ref 7–25)
CO2: 29 mmol/L (ref 20–32)
Calcium: 9.6 mg/dL (ref 8.6–10.4)
Chloride: 103 mmol/L (ref 98–110)
Creat: 0.95 mg/dL — ABNORMAL HIGH (ref 0.60–0.88)
GFR, Est African American: 59 mL/min/{1.73_m2} — ABNORMAL LOW (ref >=60)
GFR, Est Non African American: 51 mL/min/{1.73_m2} — ABNORMAL LOW (ref >=60)
Glucose, Bld: 91 mg/dL (ref 65–99)
Potassium: 4.3 mmol/L (ref 3.5–5.3)
Sodium: 142 mmol/L (ref 135–146)

## 2020-04-26 LAB — TSH: TSH: 3.42 mIU/L (ref 0.40–4.50)

## 2020-04-28 ENCOUNTER — Other Ambulatory Visit: Payer: Self-pay | Admitting: Internal Medicine

## 2020-05-02 ENCOUNTER — Other Ambulatory Visit: Payer: Self-pay | Admitting: Interventional Cardiology

## 2020-05-02 ENCOUNTER — Ambulatory Visit (INDEPENDENT_AMBULATORY_CARE_PROVIDER_SITE_OTHER): Payer: Medicare Other

## 2020-05-02 DIAGNOSIS — I4891 Unspecified atrial fibrillation: Secondary | ICD-10-CM | POA: Diagnosis not present

## 2020-05-02 DIAGNOSIS — Z5181 Encounter for therapeutic drug level monitoring: Secondary | ICD-10-CM

## 2020-05-02 LAB — PROTIME-INR
INR: 1.9 — AB (ref 0.9–1.1)
INR: 1.9 — ABNORMAL HIGH
Prothrombin Time: 19.3 s — ABNORMAL HIGH (ref 9.0–11.5)

## 2020-05-02 NOTE — Patient Instructions (Signed)
Description   Called spoke with pt's husband, advised to have pt start taking 1 tablet daily. Recheck in 2 weeks. Orders faxed to [facility] [phone].

## 2020-05-16 ENCOUNTER — Ambulatory Visit (INDEPENDENT_AMBULATORY_CARE_PROVIDER_SITE_OTHER): Payer: Medicare Other | Admitting: Pharmacist

## 2020-05-16 ENCOUNTER — Other Ambulatory Visit: Payer: Self-pay | Admitting: Cardiovascular Disease

## 2020-05-16 DIAGNOSIS — I4891 Unspecified atrial fibrillation: Secondary | ICD-10-CM

## 2020-05-16 DIAGNOSIS — Z5181 Encounter for therapeutic drug level monitoring: Secondary | ICD-10-CM | POA: Diagnosis not present

## 2020-05-16 LAB — PROTIME-INR
INR: 2.2 — ABNORMAL HIGH
Prothrombin Time: 22.2 s — ABNORMAL HIGH (ref 9.0–11.5)

## 2020-05-16 LAB — POCT INR: INR: 2.2 (ref 2.0–3.0)

## 2020-05-16 NOTE — Patient Instructions (Signed)
Description   Called spoke with pt's husband, advised to have pt start taking 1 tablet daily. Recheck in 2 weeks. Orders faxed to [facility]-[phone].       

## 2020-05-22 ENCOUNTER — Other Ambulatory Visit: Payer: Self-pay | Admitting: Internal Medicine

## 2020-05-24 DIAGNOSIS — M25512 Pain in left shoulder: Secondary | ICD-10-CM | POA: Diagnosis not present

## 2020-05-24 DIAGNOSIS — M810 Age-related osteoporosis without current pathological fracture: Secondary | ICD-10-CM | POA: Diagnosis not present

## 2020-05-24 DIAGNOSIS — M7582 Other shoulder lesions, left shoulder: Secondary | ICD-10-CM | POA: Diagnosis not present

## 2020-05-24 DIAGNOSIS — M7060 Trochanteric bursitis, unspecified hip: Secondary | ICD-10-CM | POA: Diagnosis not present

## 2020-05-24 DIAGNOSIS — M1990 Unspecified osteoarthritis, unspecified site: Secondary | ICD-10-CM | POA: Diagnosis not present

## 2020-05-24 DIAGNOSIS — M25559 Pain in unspecified hip: Secondary | ICD-10-CM | POA: Diagnosis not present

## 2020-05-25 ENCOUNTER — Other Ambulatory Visit: Payer: Self-pay

## 2020-05-25 ENCOUNTER — Non-Acute Institutional Stay: Payer: Medicare Other | Admitting: Internal Medicine

## 2020-05-25 ENCOUNTER — Encounter: Payer: Self-pay | Admitting: Internal Medicine

## 2020-05-25 VITALS — BP 124/78 | HR 82 | Temp 97.6°F | Ht 59.0 in | Wt 133.6 lb

## 2020-05-25 DIAGNOSIS — F339 Major depressive disorder, recurrent, unspecified: Secondary | ICD-10-CM | POA: Diagnosis not present

## 2020-05-25 DIAGNOSIS — I350 Nonrheumatic aortic (valve) stenosis: Secondary | ICD-10-CM

## 2020-05-25 DIAGNOSIS — N3946 Mixed incontinence: Secondary | ICD-10-CM

## 2020-05-25 DIAGNOSIS — R600 Localized edema: Secondary | ICD-10-CM

## 2020-05-25 DIAGNOSIS — M810 Age-related osteoporosis without current pathological fracture: Secondary | ICD-10-CM | POA: Diagnosis not present

## 2020-05-25 DIAGNOSIS — R4189 Other symptoms and signs involving cognitive functions and awareness: Secondary | ICD-10-CM

## 2020-05-25 DIAGNOSIS — L814 Other melanin hyperpigmentation: Secondary | ICD-10-CM | POA: Diagnosis not present

## 2020-05-25 DIAGNOSIS — E039 Hypothyroidism, unspecified: Secondary | ICD-10-CM | POA: Diagnosis not present

## 2020-05-25 DIAGNOSIS — G253 Myoclonus: Secondary | ICD-10-CM

## 2020-05-25 DIAGNOSIS — L820 Inflamed seborrheic keratosis: Secondary | ICD-10-CM | POA: Diagnosis not present

## 2020-05-25 DIAGNOSIS — R197 Diarrhea, unspecified: Secondary | ICD-10-CM

## 2020-05-25 DIAGNOSIS — D1801 Hemangioma of skin and subcutaneous tissue: Secondary | ICD-10-CM | POA: Diagnosis not present

## 2020-05-25 DIAGNOSIS — L570 Actinic keratosis: Secondary | ICD-10-CM | POA: Diagnosis not present

## 2020-05-25 DIAGNOSIS — I480 Paroxysmal atrial fibrillation: Secondary | ICD-10-CM | POA: Diagnosis not present

## 2020-05-25 DIAGNOSIS — L821 Other seborrheic keratosis: Secondary | ICD-10-CM | POA: Diagnosis not present

## 2020-05-25 MED ORDER — CLONAZEPAM 0.5 MG PO TABS: 0.2500 mg | ORAL_TABLET | Freq: Two times a day (BID) | ORAL | 0 refills | Status: DC | PRN

## 2020-05-25 NOTE — Patient Instructions (Addendum)
Clonazepam 0.25 mg half Tablet twice a day as needed only when jerking is really bad Increase back the sertraline to 25 mg BID

## 2020-05-25 NOTE — Progress Notes (Signed)
Location: [place] of Service:  Clinic (12)  Provider:   Code Status:  Goals of Care:  Advanced Directives 10/15/2018  Does Patient Have a Medical Advance Directive? No;Yes  Type of Advance Directive [place]  Does patient want to make changes to medical advance directive? -  Copy of [place] in Chart? Yes - validated most recent copy scanned in chart (See row information)  Would patient like information on creating a medical advance directive? No - Patient declined     Chief Complaint  Patient presents with  . Medical Management of Chronic Issues    Patient returns to the clinic for her 1 month follow up.     HPI: Patient is a 87 y.o. female seen today for an acute visit for Worsening Jerking  Patient has a history of CHB s/p pacemaker,  PAF on Coumadin, moderate AS Also has history of cognitive impairment, myoclonic jerks thought to be functional on Zoloft Osteoporosis on Prolia per rheumatology Urinary incontinence Chronic diarrhea followed by GI it now on cholestyramine PRN  Myoclonic Jerking Has h/o Jerking Has had extensive work up [facility] . He has tried different regimes.And now had her onZoloft and Neurontin.He does not think she has NeuroDegenerative disorder and think it is functional. She failed Keppra and Depakote  Recently her husband reduced her Sertraline to 25 mg Q HS from 75 mg QD. Since then her Jerks are worse. She sometimes get in tears due to jerking. While he was talking today patient started jerking again and got very upset They happen in the morning. Not at night. Sleeping good appetite good They want to know if she can see Neurologist again in [facility]  Diarrhea seems to be resolved by Reducing Sertraline but we had also discontinued her Aricept Edema is better on Demadex but he has been cutting the doe on half due to her Frequent  Urination     Past Medical History:  Diagnosis Date  . Arthritis   . Atrial fibrillation ([place])   . Cataract    removed bilateral  . Clotting disorder ([place])    h/o blood clot left leg  . Dementia ([place])   . Depression   . DVT (deep venous thrombosis) ([place]) 2011  . GERD (gastroesophageal reflux disease)   . HB (heart block)    s/p PPM, most recent generator change 12/11 by JA  . Heart murmur   . Hiatal hernia   . Hyperlipidemia   . Hypertension   . Hypothyroidism   . Osteoporosis   . PTE (post-transplant erythrocytosis)   . Pulmonary embolism ([place]) 2011   on coumadin  . UTI (lower urinary tract infection)     Past Surgical History:  Procedure Laterality Date  . BLADDER SURGERY     twice  . cataracts     bilateral  . CHOLECYSTECTOMY  09/2010  . COLONOSCOPY    . ESOPHAGOGASTRODUODENOSCOPY    . HEMORRHOID SURGERY    . hysterectomy (other)    . INNER EAR SURGERY    . JOINT REPLACEMENT    . pacemaker     most recent generator 12/11 by JA  . TOTAL KNEE ARTHROPLASTY Left     Allergies  Allergen Reactions  . Alendronate Other (See Comments)    Can not tolerate  . Celebrex [Celecoxib] Other (See Comments)    Can not tolerate  . Meloxicam Other (See Comments)  Avoid   . Myrbetriq [Mirabegron] Other (See Comments)    Can not tolerate  . Norco [Hydrocodone-Acetaminophen] Other (See Comments)    Dizziness and jerking  . Pradaxa [Dabigatran Etexilate Mesylate] Other (See Comments)    Can not tolerate  . Pradaxa [Dabigatran]   . Toviaz [Fesoterodine Fumarate Er] Other (See Comments)    Can not tolerate  . Vesicare [Solifenacin] Other (See Comments)    Can not tolerate    Outpatient Encounter Medications as of 05/25/2020  Medication Sig  . Calcium Carb-Cholecalciferol 600-800 MG-UNIT TABS Take 1 tablet by mouth daily.  . cholecalciferol (VITAMIN D) 1000 UNITS tablet Take 1,000 Units by mouth daily.   . cholestyramine light (PREVALITE) 4 g packet Take 1 packet  (4 g total) by mouth daily with supper.  . clonazePAM (KLONOPIN) 0.5 MG tablet Take 0.5 tablets (0.25 mg total) by mouth 2 (two) times daily as needed for anxiety.  . Dextromethorphan-Guaifenesin (MUCUS RELIEF DM PO) Take 1 tablet by mouth daily.  [name] gabapentin (NEURONTIN) 300 MG capsule TAKE 1 CAPSULE TWICE DAILY  . levothyroxine (SYNTHROID) 50 MCG tablet TAKE 1 TABLET (50 MCG TOTAL) BY MOUTH DAILY BEFORE BREAKFAST.  [name] loratadine (CLARITIN) 10 MG tablet Take 10 mg by mouth daily.  . memantine (NAMENDA) 10 MG tablet TAKE 1 TABLET TWICE DAILY  . Multiple Vitamins-Minerals (PRESERVISION AREDS 2 PO) Take 1 capsule by mouth 2 (two) times daily.  [name] NITROSTAT 0.4 MG SL tablet Place 0.4 mg under the tongue every 5 (five) minutes as needed for chest pain (MAX 3 TABLETS).   [name] omeprazole (PRILOSEC) 20 MG capsule TAKE 1 CAPSULE TWICE DAILY  . potassium chloride (KLOR-CON) 20 MEQ packet Take 20 mEq by mouth once.  . pravastatin (PRAVACHOL) 20 MG tablet TAKE 1 TABLET EVERY DAY  . Sennosides-Docusate Sodium (SENNA S PO) Take by mouth as needed.   . sertraline (ZOLOFT) 25 MG tablet TAKE 1 TABLET IN THE MORNING, AND 2 TABLETS IN THE EVENING AS DIRECTED. TOTAL 3 TABLETS DAILY. (Patient taking differently: 1 in pm)  . torsemide (DEMADEX) 20 MG tablet Take 20 mg by mouth daily.  [name] warfarin (COUMADIN) 5 MG tablet TAKE 1 TABLET DAILY OR AS DIRECTED BY THE COUMADIN CLINIC.  . [DISCONTINUED] colestipol (COLESTID) 5 g granules Take 5 g by mouth daily with supper.   No facility-administered encounter medications on file as of 05/25/2020.    Review of Systems:  Review of Systems  Constitutional: Negative.   HENT: Negative.   Respiratory: Negative.   Cardiovascular: Positive for leg swelling.  Gastrointestinal: Positive for diarrhea.  Genitourinary: Negative.   Musculoskeletal: Positive for gait problem.  Skin: Negative.   Neurological: Negative for dizziness.  Psychiatric/Behavioral: Positive for behavioral  problems, confusion and dysphoric mood. The patient is nervous/anxious.     Health Maintenance  Topic Date Due  . DEXA SCAN  Never done  . TETANUS/TDAP  03/10/2022  . INFLUENZA VACCINE  Completed  . COVID-19 Vaccine  Completed  . PNA vac Low Risk Adult  Completed    Physical Exam: Vitals:   05/25/20 1520  BP: 124/78  Pulse: 82  Temp: 97.6 F (36.4 C)  SpO2: 95%  Weight: 133 lb 9.6 oz (60.6 kg)  Height: 4\' 11"  (1.499 m)   Body mass index is 26.98 kg/m. Physical Exam Vitals reviewed.  Constitutional:      Appearance: Normal appearance.  HENT:     Head: Normocephalic.     Nose: Nose normal.  Mouth/Throat:     Mouth: Mucous membranes are moist.     Pharynx: Oropharynx is clear.  Eyes:     Pupils: Pupils are equal, round, and reactive to light.  Cardiovascular:     Rate and Rhythm: Normal rate and regular rhythm.     Pulses: Normal pulses.     Heart sounds: Murmur heard.    Pulmonary:     Effort: Pulmonary effort is normal. No respiratory distress.     Breath sounds: Normal breath sounds.  Abdominal:     General: Abdomen is flat. Bowel sounds are normal.     Palpations: Abdomen is soft.  Musculoskeletal:        General: Swelling present.     Cervical back: Neck supple.     Comments: Swelling is much Improved  Skin:    General: Skin is warm.  Neurological:     General: No focal deficit present.     Mental Status: She is alert.     Comments: While Husband was talking to me she started having involuntary Movements and tears came ot her eyes. Stayed alert during this time Told to take deep breadth and distraction worked and she stopped   Psychiatric:     Comments: Anxious     Labs reviewed: Basic Metabolic Panel: Recent Labs    10/15/19 0000 03/31/20 0820 04/25/20 0800  NA 141 141 142  K 4.3 4.2 4.3  CL 104 104 103  CO2 28 32 29  GLUCOSE 83 93 91  BUN 22 21 21   CREATININE 0.73 0.77 0.95*  CALCIUM 9.7 9.7 9.6  TSH 2.17 2.19 3.42   Liver  Function Tests: Recent Labs    10/15/19 0000 03/31/20 0820  AST 15 15  ALT 10 11  BILITOT 0.4 0.5  PROT 6.1 5.9*   No results for input(s): LIPASE, AMYLASE in the last 8760 hours. No results for input(s): AMMONIA in the last 8760 hours. CBC: Recent Labs    10/15/19 0000 03/31/20 0820  WBC 5.3 6.2  NEUTROABS 3,095 4,117  HGB 12.1 12.1  HCT 37.4 37.5  MCV 92.8 92.1  PLT 204 241   Lipid Panel: Recent Labs    10/15/19 0000  CHOL 158  HDL 60  LDLCALC 78  TRIG 116  CHOLHDL 2.6   No results found for: HGBA1C  Procedures since last visit: No results found.  Assessment/Plan 1. Myoclonic jerking Patient husband takes care of her meds and he reduced her Sertraline from 3 tabs a day of 25 mg to one tablet to help her diarrhea and now she is having Jerking again   Restart Sertraline 25 mg BID  Also Take Klonopin 0.25 mg BID PRN for jerking If no improvement they want ot be referred to Neurology in [place]   2. Bilateral leg edema Doing well on Demadex and Elevation He is giving her half a dose BUN and creat are stable   3. Cognitive impairment Refuse to move to higher level of care On Namenda Aricept stopped due to diarrhea MMSE 27/30before Failed her Clock Drawing Recall was 3/3  4. Paroxysmal atrial fibrillation (HCC) COumadin 5. Depression, recurrent ([place]) On Sertaline  6. Moderate aortic stenosis Follows with Cardiology   7. Acquired hypothyroidism TSH stable  8. Diarrhea in adult patient Diarrhea is improved Husband thinks it is due to reducing the Sertraline Can be also due to Discontinuation of Aricept Un fortunately due to Worsening Myoclonic Jerks he has to increase the dose again   9.  Age-related osteoporosis without current pathological fracture On Prolia by Rheumatologist  10. Mixed incontinence urge and stress Intolerant to Myrebetiq Does not want tosee urology yet    Labs/tests ordered:  * No order type specified  * Next appt:  06/22/2020

## 2020-06-01 ENCOUNTER — Other Ambulatory Visit: Payer: Self-pay | Admitting: Internal Medicine

## 2020-06-02 ENCOUNTER — Encounter (HOSPITAL_COMMUNITY): Payer: Self-pay | Admitting: Internal Medicine

## 2020-06-02 ENCOUNTER — Emergency Department (HOSPITAL_COMMUNITY): Payer: Medicare Other

## 2020-06-02 ENCOUNTER — Ambulatory Visit (INDEPENDENT_AMBULATORY_CARE_PROVIDER_SITE_OTHER): Payer: Medicare Other

## 2020-06-02 ENCOUNTER — Other Ambulatory Visit: Payer: Self-pay

## 2020-06-02 ENCOUNTER — Inpatient Hospital Stay (HOSPITAL_COMMUNITY)
Admission: EM | Admit: 2020-06-02 | Discharge: 2020-06-06 | DRG: 177 | Disposition: A | Discharge status: Home / Self Care | Payer: Medicare Other | Attending: Internal Medicine | Admitting: Internal Medicine

## 2020-06-02 ENCOUNTER — Telehealth: Payer: Self-pay | Admitting: *Deleted

## 2020-06-02 DIAGNOSIS — Z87440 Personal history of urinary (tract) infections: Secondary | ICD-10-CM | POA: Diagnosis not present

## 2020-06-02 DIAGNOSIS — I480 Paroxysmal atrial fibrillation: Secondary | ICD-10-CM | POA: Diagnosis present

## 2020-06-02 DIAGNOSIS — F0390 Unspecified dementia without behavioral disturbance: Secondary | ICD-10-CM | POA: Diagnosis present

## 2020-06-02 DIAGNOSIS — M810 Age-related osteoporosis without current pathological fracture: Secondary | ICD-10-CM | POA: Diagnosis present

## 2020-06-02 DIAGNOSIS — E782 Mixed hyperlipidemia: Secondary | ICD-10-CM | POA: Diagnosis not present

## 2020-06-02 DIAGNOSIS — R0902 Hypoxemia: Secondary | ICD-10-CM | POA: Diagnosis not present

## 2020-06-02 DIAGNOSIS — Z950 Presence of cardiac pacemaker: Secondary | ICD-10-CM

## 2020-06-02 DIAGNOSIS — Z79890 Hormone replacement therapy (postmenopausal): Secondary | ICD-10-CM | POA: Diagnosis not present

## 2020-06-02 DIAGNOSIS — I10 Essential (primary) hypertension: Secondary | ICD-10-CM | POA: Diagnosis not present

## 2020-06-02 DIAGNOSIS — I110 Hypertensive heart disease with heart failure: Secondary | ICD-10-CM | POA: Diagnosis present

## 2020-06-02 DIAGNOSIS — Z7901 Long term (current) use of anticoagulants: Secondary | ICD-10-CM | POA: Diagnosis not present

## 2020-06-02 DIAGNOSIS — Z79899 Other long term (current) drug therapy: Secondary | ICD-10-CM

## 2020-06-02 DIAGNOSIS — Z20828 Contact with and (suspected) exposure to other viral communicable diseases: Secondary | ICD-10-CM | POA: Diagnosis not present

## 2020-06-02 DIAGNOSIS — Z8261 Family history of arthritis: Secondary | ICD-10-CM

## 2020-06-02 DIAGNOSIS — Z86711 Personal history of pulmonary embolism: Secondary | ICD-10-CM | POA: Diagnosis not present

## 2020-06-02 DIAGNOSIS — K219 Gastro-esophageal reflux disease without esophagitis: Secondary | ICD-10-CM | POA: Diagnosis not present

## 2020-06-02 DIAGNOSIS — R531 Weakness: Secondary | ICD-10-CM | POA: Diagnosis not present

## 2020-06-02 DIAGNOSIS — J9601 Acute respiratory failure with hypoxia: Secondary | ICD-10-CM | POA: Diagnosis present

## 2020-06-02 DIAGNOSIS — I442 Atrioventricular block, complete: Secondary | ICD-10-CM | POA: Diagnosis not present

## 2020-06-02 DIAGNOSIS — Z90710 Acquired absence of both cervix and uterus: Secondary | ICD-10-CM

## 2020-06-02 DIAGNOSIS — Z66 Do not resuscitate: Secondary | ICD-10-CM | POA: Diagnosis present

## 2020-06-02 DIAGNOSIS — R197 Diarrhea, unspecified: Secondary | ICD-10-CM | POA: Diagnosis present

## 2020-06-02 DIAGNOSIS — Z8249 Family history of ischemic heart disease and other diseases of the circulatory system: Secondary | ICD-10-CM

## 2020-06-02 DIAGNOSIS — J9600 Acute respiratory failure, unspecified whether with hypoxia or hypercapnia: Secondary | ICD-10-CM

## 2020-06-02 DIAGNOSIS — I4891 Unspecified atrial fibrillation: Secondary | ICD-10-CM | POA: Diagnosis not present

## 2020-06-02 DIAGNOSIS — I959 Hypotension, unspecified: Secondary | ICD-10-CM | POA: Diagnosis not present

## 2020-06-02 DIAGNOSIS — J1282 Pneumonia due to coronavirus disease 2019: Secondary | ICD-10-CM

## 2020-06-02 DIAGNOSIS — M1990 Unspecified osteoarthritis, unspecified site: Secondary | ICD-10-CM | POA: Diagnosis not present

## 2020-06-02 DIAGNOSIS — Z86718 Personal history of other venous thrombosis and embolism: Secondary | ICD-10-CM

## 2020-06-02 DIAGNOSIS — E785 Hyperlipidemia, unspecified: Secondary | ICD-10-CM | POA: Diagnosis not present

## 2020-06-02 DIAGNOSIS — Z888 Allergy status to other drugs, medicaments and biological substances status: Secondary | ICD-10-CM

## 2020-06-02 DIAGNOSIS — E039 Hypothyroidism, unspecified: Secondary | ICD-10-CM | POA: Diagnosis present

## 2020-06-02 DIAGNOSIS — R4189 Other symptoms and signs involving cognitive functions and awareness: Secondary | ICD-10-CM | POA: Diagnosis present

## 2020-06-02 DIAGNOSIS — K529 Noninfective gastroenteritis and colitis, unspecified: Secondary | ICD-10-CM | POA: Diagnosis not present

## 2020-06-02 DIAGNOSIS — G253 Myoclonus: Secondary | ICD-10-CM | POA: Diagnosis not present

## 2020-06-02 DIAGNOSIS — I5032 Chronic diastolic (congestive) heart failure: Secondary | ICD-10-CM | POA: Diagnosis present

## 2020-06-02 DIAGNOSIS — I517 Cardiomegaly: Secondary | ICD-10-CM | POA: Diagnosis not present

## 2020-06-02 DIAGNOSIS — R0689 Other abnormalities of breathing: Secondary | ICD-10-CM | POA: Diagnosis not present

## 2020-06-02 DIAGNOSIS — Z885 Allergy status to narcotic agent status: Secondary | ICD-10-CM

## 2020-06-02 DIAGNOSIS — U071 COVID-19: Secondary | ICD-10-CM | POA: Diagnosis present

## 2020-06-02 HISTORY — DX: Chronic diastolic (congestive) heart failure: I50.32

## 2020-06-02 LAB — CUP PACEART REMOTE DEVICE CHECK
Battery Impedance: 1486 Ohm
Battery Remaining Longevity: 46 mo
Battery Voltage: 2.76 V
Brady Statistic AP VP Percent: 1 %
Brady Statistic AP VS Percent: 0 %
Brady Statistic AS VP Percent: 99 %
Brady Statistic AS VS Percent: 0 %
Date Time Interrogation Session: 20220120140737
Implantable Lead Implant Date: 19980518
Implantable Lead Implant Date: 20040301
Implantable Lead Location: 753859
Implantable Lead Location: 753860
Implantable Lead Model: 4269
Implantable Lead Model: 4285
Implantable Lead Serial Number: 249440
Implantable Lead Serial Number: 295540
Implantable Pulse Generator Implant Date: 20111206
Lead Channel Impedance Value: 643 Ohm
Lead Channel Impedance Value: 819 Ohm
Lead Channel Pacing Threshold Amplitude: 1.5 V
Lead Channel Pacing Threshold Pulse Width: 0.4 ms
Lead Channel Setting Pacing Amplitude: 2 V
Lead Channel Setting Pacing Amplitude: 2.5 V
Lead Channel Setting Pacing Pulse Width: 0.4 ms
Lead Channel Setting Sensing Sensitivity: 5.6 mV

## 2020-06-02 LAB — URINALYSIS, ROUTINE W REFLEX MICROSCOPIC
Bilirubin Urine: NEGATIVE
Glucose, UA: NEGATIVE mg/dL
Hgb urine dipstick: NEGATIVE
Ketones, ur: NEGATIVE mg/dL
Nitrite: NEGATIVE
Protein, ur: NEGATIVE mg/dL
Specific Gravity, Urine: 1.013 (ref 1.005–1.030)
pH: 6 (ref 5.0–8.0)

## 2020-06-02 LAB — CBC WITH DIFFERENTIAL/PLATELET
Abs Immature Granulocytes: 0.13 10*3/uL — ABNORMAL HIGH (ref 0.00–0.07)
Basophils Absolute: 0 10*3/uL (ref 0.0–0.1)
Basophils Relative: 0 %
Eosinophils Absolute: 0 10*3/uL (ref 0.0–0.5)
Eosinophils Relative: 0 %
HCT: 38.7 % (ref 36.0–46.0)
Hemoglobin: 12.1 g/dL (ref 12.0–15.0)
Immature Granulocytes: 1 %
Lymphocytes Relative: 11 %
Lymphs Abs: 1.5 10*3/uL (ref 0.7–4.0)
MCH: 29.3 pg (ref 26.0–34.0)
MCHC: 31.3 g/dL (ref 30.0–36.0)
MCV: 93.7 fL (ref 80.0–100.0)
Monocytes Absolute: 0.8 10*3/uL (ref 0.1–1.0)
Monocytes Relative: 6 %
Neutro Abs: 11.5 10*3/uL — ABNORMAL HIGH (ref 1.7–7.7)
Neutrophils Relative %: 82 %
Platelets: 171 10*3/uL (ref 150–400)
RBC: 4.13 MIL/uL (ref 3.87–5.11)
RDW: 14.5 % (ref 11.5–15.5)
WBC: 14 10*3/uL — ABNORMAL HIGH (ref 4.0–10.5)
nRBC: 0 % (ref 0.0–0.2)

## 2020-06-02 LAB — COMPREHENSIVE METABOLIC PANEL
ALT: 14 U/L (ref 0–44)
AST: 18 U/L (ref 15–41)
Albumin: 3.6 g/dL (ref 3.5–5.0)
Alkaline Phosphatase: 68 U/L (ref 38–126)
Anion gap: 11 (ref 5–15)
BUN: 22 mg/dL (ref 8–23)
CO2: 26 mmol/L (ref 22–32)
Calcium: 8.7 mg/dL — ABNORMAL LOW (ref 8.9–10.3)
Chloride: 105 mmol/L (ref 98–111)
Creatinine, Ser: 0.93 mg/dL (ref 0.44–1.00)
GFR, Estimated: 57 mL/min — ABNORMAL LOW (ref >60)
Glucose, Bld: 119 mg/dL — ABNORMAL HIGH (ref 70–99)
Potassium: 3.9 mmol/L (ref 3.5–5.1)
Sodium: 142 mmol/L (ref 135–145)
Total Bilirubin: 0.4 mg/dL (ref 0.3–1.2)
Total Protein: 6.5 g/dL (ref 6.5–8.1)

## 2020-06-02 LAB — LACTIC ACID, PLASMA
Lactic Acid, Venous: 1 mmol/L (ref 0.5–1.9)
Lactic Acid, Venous: 0.9 mmol/L (ref 0.5–1.9)

## 2020-06-02 LAB — PROTIME-INR
INR: 2.2 — ABNORMAL HIGH (ref 0.8–1.2)
Prothrombin Time: 23.5 seconds — ABNORMAL HIGH (ref 11.4–15.2)

## 2020-06-02 LAB — POC SARS CORONAVIRUS 2 AG -  ED: SARS Coronavirus 2 Ag: POSITIVE — AB

## 2020-06-02 MED ORDER — SODIUM CHLORIDE 0.9 % IV SOLN
200.0000 mg | Freq: Once | INTRAVENOUS | Status: AC
  Administered 2020-06-02: 200 mg via INTRAVENOUS
  Filled 2020-06-02: qty 200

## 2020-06-02 MED ORDER — SODIUM CHLORIDE 0.9 % IV SOLN
100.0000 mg | Freq: Every day | INTRAVENOUS | Status: AC
  Administered 2020-06-03 – 2020-06-06 (×4): 100 mg via INTRAVENOUS
  Filled 2020-06-02 (×4): qty 20

## 2020-06-02 NOTE — H&P (Signed)
[name] LTJ:[id number] DOB: 03-16-1923 DOA: 06/02/2020     PCP: [name], MD   Outpatient Specialists:   CARDS:     Dr. Rayann Heman    Patient arrived to ER on 06/02/20 at 1652 Referred by Attending Lucrezia Starch, MD   Patient coming from:  [facility] [place]  Chief Complaint:   Chief Complaint  Patient presents with  . Weakness    Family stated a mild amount of confusion.    HPI: [name] is a 87 y.o. female with medical history significant of dementia, hypertension, A. fib on Coumadin, PE/DVT 2011,  GERD, HLD, HTN, hypothyroidism, myoclonic jerking, status post pacemaker    Presented with  generalized weakness cough for past 1 wk.  Patient was too weak to transfer from bed to chair.  This is not normal for her.  Noted to have hypoxia down to 88% on room air started 2 L per EMS. She has history of myoclonic jerking and has recently started on Klonopin Hx of chronic diarrhea Family reports confusion She uses a walker at baseline  Denies any sick contacts They have been around daughter and son-in-law  Infectious risk factors:  Reports, shortness of breath,     Has  been vaccinated against COVID and boosted March 28, 2020   Initial COVID TEST  Rapid test  POSITIVE,   No results found for: SARSCOV2NAA   Regarding pertinent Chronic problems:    Hyperlipidemia -  on statins Pravachol Lipid Panel     Component Value Date/Time   CHOL 158 10/15/2019 0000   CHOL 198 03/19/2017 0000   TRIG 116 10/15/2019 0000   TRIG 130 03/19/2017 0000   HDL 60 10/15/2019 0000   CHOLHDL 2.6 10/15/2019 0000   VLDL 18 08/25/2009 0440   LDLCALC 78 10/15/2019 0000   LDLCALC 111 03/19/2017 0000     HTN on torsemide   chronic CHF diastolic - last echo 0762 Torsemide    Hypothyroidism:  Lab Results  Component Value Date   TSH 3.42 04/25/2020   on synthroid   A. Fib -  - CHA2DS2 vas score     7    current  on anticoagulation with   Coumadin  ,           Dementia - on Nemenda   Myoclonic jerks recently started on Klonopin   While in ER: Noted to be positive for COVID Febrile up to 100 Elevated white blood cell count at 14 Chest x-ray showing infiltrate in the left base  Started on Remdesivir   Hospitalist was called for admission for COVID-pneumonia and acute respiratory failure hypoxia  The following Work up has been ordered so far:  Orders Placed This Encounter  Procedures  . Urine culture  . DG Chest Port 1 View  . Comprehensive metabolic panel  . CBC with Differential  . Lactic acid, plasma  . Urinalysis, Routine w reflex microscopic Urine, Clean Catch  . Protime-INR  . Consult to hospitalist  ALL PATIENTS BEING ADMITTED/HAVING PROCEDURES NEED COVID-19 SCREENING  . Airborne and Contact precautions  . POC SARS Coronavirus 2 Ag-ED - Nasal Swab (BD Veritor Kit)  . POC SARS Coronavirus 2 Ag-ED - Nasal Swab (BD Veritor Kit)  . ED EKG    Following Medications were ordered in ER: Medications  remdesivir 200 mg in sodium chloride 0.9% 250 mL IVPB (has no administration in time range)    Followed by  remdesivir 100 mg  in sodium chloride 0.9 % 100 mL IVPB (has no administration in time range)        Consult Orders  (From admission, onward)         Start     Ordered   06/02/20 2210  Consult to hospitalist  ALL PATIENTS BEING ADMITTED/HAVING PROCEDURES NEED COVID-19 SCREENING  Once       Comments: ALL PATIENTS BEING ADMITTED/HAVING PROCEDURES NEED COVID-19 SCREENING  Provider:  (Not yet assigned)  Question Answer Comment  Place call to: [facility]   Reason for Consult Admit      06/02/20 2209           Significant initial  Findings: Abnormal Labs Reviewed  COMPREHENSIVE METABOLIC PANEL - Abnormal; Notable for the following components:      Result Value   Glucose, Bld 119 (*)    Calcium 8.7 (*)    GFR, Estimated 57 (*)    All other components within normal limits  CBC WITH  DIFFERENTIAL/PLATELET - Abnormal; Notable for the following components:   WBC 14.0 (*)    Neutro Abs 11.5 (*)    Abs Immature Granulocytes 0.13 (*)    All other components within normal limits  PROTIME-INR - Abnormal; Notable for the following components:   Prothrombin Time 23.5 (*)    INR 2.2 (*)    All other components within normal limits  POC SARS CORONAVIRUS 2 AG -  ED - Abnormal; Notable for the following components:   SARS Coronavirus 2 Ag POSITIVE (*)    All other components within normal limits   Otherwise labs showing:    Recent Labs  Lab 06/02/20 1900  NA 142  K 3.9  CO2 26  GLUCOSE 119*  BUN 22  CREATININE 0.93  CALCIUM 8.7*    Cr    stable,    Lab Results  Component Value Date   CREATININE 0.93 06/02/2020   CREATININE 0.95 (H) 04/25/2020   CREATININE 0.77 03/31/2020    Recent Labs  Lab 06/02/20 1900  AST 18  ALT 14  ALKPHOS 68  BILITOT 0.4  PROT 6.5  ALBUMIN 3.6   Lab Results  Component Value Date   CALCIUM 8.7 (L) 06/02/2020     WBC      Component Value Date/Time   WBC 14.0 (H) 06/02/2020 1900   LYMPHSABS 1.5 06/02/2020 1900   MONOABS 0.8 06/02/2020 1900   EOSABS 0.0 06/02/2020 1900   BASOSABS 0.0 06/02/2020 1900    Plt: Lab Results  Component Value Date   PLT 171 06/02/2020    Lactic Acid, Venous    Component Value Date/Time   LATICACIDVEN 1.0 06/02/2020 1900    Procalcitonin   Ordered   COVID-19 Labs  No results for input(s): DDIMER, FERRITIN, LDH, CRP in the last 72 hours.  No results found for: SARSCOV2NAA    HG/HCT  stable,      Component Value Date/Time   HGB 12.1 06/02/2020 1900   HGB 13.1 03/19/2017 0000   HCT 38.7 06/02/2020 1900   HCT 41.2 03/19/2017 0000   MCV 93.7 06/02/2020 1900   MCV 95.6 03/19/2017 0000     Troponin ordered Cardiac Panel (last 3 results) No results for input(s): CKTOTAL, CKMB, TROPONINI, RELINDX in the last 72 hours.     ECG: Ordered Personally reviewed by me showing: HR :  92 Rhythm:   LBBB  Paced   QTC 525   BNP (last 3 results) No results for input(s): BNP in the  last 8760 hours.    DM  labs:  HbA1C: No results for input(s): HGBA1C in the last 8760 hours.     CBG (last 3)  No results for input(s): GLUCAP in the last 72 hours.     UA no evidence of UTI   Urine analysis:    Component Value Date/Time   COLORURINE YELLOW 06/02/2020 2120   APPEARANCEUR HAZY (A) 06/02/2020 2120   LABSPEC 1.013 06/02/2020 2120   PHURINE 6.0 06/02/2020 2120   GLUCOSEU NEGATIVE 06/02/2020 2120   HGBUR NEGATIVE 06/02/2020 2120   BILIRUBINUR NEGATIVE 06/02/2020 2120   KETONESUR NEGATIVE 06/02/2020 2120   PROTEINUR NEGATIVE 06/02/2020 2120   UROBILINOGEN 0.2 11/01/2013 1010   NITRITE NEGATIVE 06/02/2020 2120   LEUKOCYTESUR TRACE (A) 06/02/2020 2120    Ordered  CXR - infiltrate vs atelectasis    ED Triage Vitals  Enc Vitals Group     BP 06/02/20 1723 126/68     Pulse Rate 06/02/20 1723 (!) 102     Resp 06/02/20 1723 (!) 23     Temp 06/02/20 1723 100 F (37.8 C)     Temp Source 06/02/20 1723 Oral     SpO2 06/02/20 1723 90 %     Weight 06/02/20 1808 134 lb (60.8 kg)     Height 06/02/20 1808 5' (1.524 m)     Head Circumference --      Peak Flow --      Pain Score 06/02/20 1808 0     Pain Loc --      Pain Edu? --      Excl. in [place]? --   TMAX(24)@       Latest  Blood pressure (!) 113/55, pulse 83, temperature 99.7 F (37.6 C), temperature source Oral, resp. rate 20, height 5' (1.524 m), weight 60.8 kg, SpO2 92 %.   Review of Systems:    Pertinent positives include: chills, fatigue,   Constitutional:  No weight loss, night sweats, Fevers, weight loss  HEENT:  No headaches, Difficulty swallowing,Tooth/dental problems,Sore throat,  No sneezing, itching, ear ache, nasal congestion, post nasal drip,  Cardio-vascular:  No chest pain, Orthopnea, PND, anasarca, dizziness, palpitations.no Bilateral lower extremity swelling  GI:  No heartburn, indigestion,  abdominal pain, nausea, vomiting, diarrhea, change in bowel habits, loss of appetite, melena, blood in stool, hematemesis Resp:  no shortness of breath at rest. No dyspnea on exertion, No excess mucus, no productive cough, No non-productive cough, No coughing up of blood.No change in color of mucus.No wheezing. Skin:  no rash or lesions. No jaundice GU:  no dysuria, change in color of urine, no urgency or frequency. No straining to urinate.  No flank pain.  Musculoskeletal:  No joint pain or no joint swelling. No decreased range of motion. No back pain.  Psych:  No change in mood or affect. No depression or anxiety. No memory loss.  Neuro: no localizing neurological complaints, no tingling, no weakness, no double vision, no gait abnormality, no slurred speech, no confusion  All systems reviewed and apart from [place] all are negative  Past Medical History:   Past Medical History:  Diagnosis Date  . Arthritis   . Atrial fibrillation ([place])   . Cataract    removed bilateral  . Chronic diastolic CHF (congestive heart failure) ([place]) 06/02/2020  . Clotting disorder ([place])    h/o blood clot left leg  . Dementia ([place])   . Depression   . DVT (deep venous thrombosis) ([place]) 2011  . GERD (  gastroesophageal reflux disease)   . HB (heart block)    s/p PPM, most recent generator change 12/11 by JA  . Heart murmur   . Hiatal hernia   . Hyperlipidemia   . Hypertension   . Hypothyroidism   . Osteoporosis   . PTE (post-transplant erythrocytosis)   . Pulmonary embolism ([place]) 2011   on coumadin  . UTI (lower urinary tract infection)      Past Surgical History:  Procedure Laterality Date  . BLADDER SURGERY     twice  . cataracts     bilateral  . CHOLECYSTECTOMY  09/2010  . COLONOSCOPY    . ESOPHAGOGASTRODUODENOSCOPY    . HEMORRHOID SURGERY    . hysterectomy (other)    . INNER EAR SURGERY    . JOINT REPLACEMENT    . pacemaker     most recent generator 12/11 by JA  . TOTAL KNEE  ARTHROPLASTY Left     Social History:  Ambulatory  walker      reports that she has never smoked. She has never used smokeless tobacco. She reports that she does not drink alcohol and does not use drugs.   Family History:   Family History  Problem Relation Age of Onset  . Stroke Father   . Heart disease Father   . Arthritis Father   . Heart disease Mother   . Arthritis Mother   . Stomach cancer Brother   . Stomach cancer Brother   . Liver cancer Brother   . Breast cancer Sister   . Colon cancer Neg Hx   . Colon polyps Neg Hx   . Esophageal cancer Neg Hx   . Rectal cancer Neg Hx     Allergies: Allergies  Allergen Reactions  . Alendronate Other (See Comments)    Can not tolerate  . Celebrex [Celecoxib] Other (See Comments)    Can not tolerate  . Meloxicam Other (See Comments)    Avoid   . Myrbetriq [Mirabegron] Other (See Comments)    Can not tolerate  . Norco [Hydrocodone-Acetaminophen] Other (See Comments)    Dizziness and jerking  . Pradaxa [Dabigatran Etexilate Mesylate] Other (See Comments)    Can not tolerate  . Pradaxa [Dabigatran]   . Toviaz [Fesoterodine Fumarate Er] Other (See Comments)    Can not tolerate  . Vesicare [Solifenacin] Other (See Comments)    Can not tolerate     Prior to Admission medications   Medication Sig Start Date End Date Taking? Authorizing Provider  Calcium Carb-Cholecalciferol 600-800 MG-UNIT TABS Take 1 tablet by mouth daily.   Yes [provider]  cholecalciferol (VITAMIN D) 1000 UNITS tablet Take 1,000 Units by mouth daily.    Yes [provider]  clonazePAM (KLONOPIN) 0.5 MG tablet Take 0.5 tablets (0.25 mg total) by mouth 2 (two) times daily as needed for anxiety. 05/25/20  Yes [name], MD  gabapentin (NEURONTIN) 300 MG capsule TAKE 1 CAPSULE TWICE DAILY Patient taking differently: Take 300 mg by mouth 2 (two) times daily. 02/08/20  Yes [name], MD  levothyroxine (SYNTHROID) 50 MCG tablet  TAKE 1 TABLET (50 MCG TOTAL) BY MOUTH DAILY BEFORE BREAKFAST. 02/17/20  Yes [name], MD  loratadine (CLARITIN) 10 MG tablet Take 10 mg by mouth daily.   Yes [provider]  memantine (NAMENDA) 10 MG tablet TAKE 1 TABLET TWICE DAILY Patient taking differently: Take 10 mg by mouth 2 (two) times daily. 02/15/20  Yes [name], MD  Multiple Vitamins-Minerals (PRESERVISION AREDS 2 PO) Take 1 capsule by mouth 2 (two) times daily.   Yes [provider]  NITROSTAT 0.4 MG SL tablet Place 0.4 mg under the tongue every 5 (five) minutes as needed for chest pain (MAX 3 TABLETS).  02/09/11  Yes [provider]  omeprazole (PRILOSEC) 20 MG capsule TAKE 1 CAPSULE TWICE DAILY Patient taking differently: Take 20 mg by mouth 2 (two) times daily before a meal. 01/06/20  Yes [name], MD  potassium chloride (KLOR-CON) 20 MEQ packet Take 20 mEq by mouth daily.   Yes [provider]  pravastatin (PRAVACHOL) 20 MG tablet TAKE 1 TABLET EVERY DAY Patient taking differently: Take 20 mg by mouth daily. 05/23/20  Yes [name], MD  sertraline (ZOLOFT) 25 MG tablet TAKE 1 TABLET IN THE MORNING, AND 2 TABLETS IN THE EVENING AS DIRECTED. TOTAL 3 TABLETS DAILY. Patient taking differently: Take 25 mg by mouth in the morning and at bedtime. 02/01/20  Yes [name], MD  torsemide (DEMADEX) 20 MG tablet Take 20 mg by mouth daily.   Yes [provider]  warfarin (COUMADIN) 5 MG tablet TAKE 1 TABLET DAILY OR AS DIRECTED BY THE COUMADIN CLINIC. Patient taking differently: Take 5 mg by mouth See admin instructions. Take 1 tablet daily or as directed by the Coumadin Clinic. 09/21/19  Yes Allred, Jeneen Rinks, MD  colestipol (COLESTID) 5 g granules Take 5 g by mouth daily with supper. 02/22/20 02/29/20  [name], MD   Physical Exam: Vitals with BMI 06/02/2020 06/02/2020 06/02/2020  Height - - -  Weight - - -  BMI - - -  Systolic 458 86 099  Diastolic 55 69 54  Pulse  83 84 84     1. General:  in No Acute distress   Chronically ill -appearing 2. Psychological: Alert and   Oriented 3. Head/ENT:    Dry Mucous Membranes                          Head Non traumatic, neck supple                          Poor Dentition 4. SKIN:  decreased Skin turgor,  Skin clean Dry and intact no rash 5. Heart: Regular rate and rhythm    Murmur, no Rub or gallop 6. Lungs Clear to auscultation bilaterally, no wheezes or crackles   7. Abdomen: Soft, non-tender, Non distended bowel sounds present 8. Lower extremities: no clubbing, cyanosis, no  edema 9. Neurologically Grossly intact, moving all 4 extremities equally  10. MSK: Normal range of motion   All other LABS:     Recent Labs  Lab 06/02/20 1900  WBC 14.0*  NEUTROABS 11.5*  HGB 12.1  HCT 38.7  MCV 93.7  PLT 171     Recent Labs  Lab 06/02/20 1900  NA 142  K 3.9  CL 105  CO2 26  GLUCOSE 119*  BUN 22  CREATININE 0.93  CALCIUM 8.7*     Recent Labs  Lab 06/02/20 1900  AST 18  ALT 14  ALKPHOS 68  BILITOT 0.4  PROT 6.5  ALBUMIN 3.6     Cultures:    Component Value Date/Time   SDES URINE, CLEAN CATCH 11/01/2013 1010   SPECREQUEST NONE 11/01/2013 1010   CULT  11/01/2013 1010    KLEBSIELLA OXYTOCA Performed at [facility]  REPTSTATUS 11/04/2013 FINAL 11/01/2013 1010     Radiological Exams on Admission: DG Chest Port 1 View  Result Date: 06/02/2020 CLINICAL DATA:  Hypoxia EXAM: PORTABLE CHEST 1 VIEW COMPARISON:  11/01/2013 FINDINGS: Left-sided pacing device as before. The right lung is grossly clear. Borderline cardiomegaly with aortic atherosclerosis. Patchy opacity left base. No pneumothorax IMPRESSION: Patchy atelectasis or infiltrate at the left base. Electronically Signed   By: Donavan Foil M.D.   On: 06/02/2020 19:05   CUP PACEART REMOTE DEVICE CHECK  Result Date: 06/02/2020 Scheduled remote reviewed. Normal device function.  Atrial arrhythmias were all less than one  minute Next remote 91 days. Kathy Breach, RN, CCDS, [employer]   Chart has been reviewed    Assessment/Plan  87 y.o. female with medical history significant of dementia, hypertension, A. fib on Coumadin, PE/DVT 2011,, GERD, HLD, HTN, hypothyroidism, myoclonic jerking, status post pacemaker Admitted for  Covid Pneumonia and acute respiratory failure  Present on Admission: . Pneumonia due to COVID-19 virus -patient noted to have mild hypoxia requiring 2 L Evidence of pneumonia on chest x-ray Patient is fully vaccinated and boosted. Inflammatory markers are currently pending Initiated remdesivir and Decadron Pronate as able to tolerate Discussed with family at this point they would like for her to be DO NOT RESUSCITATE DO NOT INTUBATE Obtain procalcitonin and if elevated consider   IV antibiotics But for now we will hold off  . PAROXYSMAL ATRIAL FIBRILLATION -           - CHA2DS2 vas score 7  : continue current anticoagulation with  Coumadin per pharmacy, does not appear to be on any rate control medications.  Ebony Hail  -placed for history of third-degree AV block  . Myoclonic jerking given some increased confusion hold off on Klonopin for tonight  . Hyperlipidemia chronic stable  . GERD (gastroesophageal reflux disease) continue PPI   . Cognitive impairment watch for any signs of sundowning  . Acquired hypothyroidism -- Check TSH continue home medications at current dose  . Chronic diastolic CHF (congestive heart failure) (HCC) appears to be on the dry side will hold torsemide for tonight   . Acute respiratory failure with hypoxia (HCC) - this patient has acute respiratory failure with Hypoxia   as documented by the presence of following: O2 saturatio< 90% on RA   Likely due to:  COVID pneumonia Provide O2 therapy and titrate as needed  Continuous pulse ox   check Pulse ox with ambulation prior to discharge   may need  TC consult for home O2 set  up  Prone if able  flutter valve ordered    Other plan as per orders.  DVT prophylaxis: on coumadin    Code Status:    Code Status: Not on file  DNR/DNI   as per family  I had personally discussed CODE STATUS with patient and family    Family Communication:   Family not at  Bedside  plan of care was discussed on the phone with   Daughter,  Husband,   Disposition Plan:                              Back to current facility when stable                              Following barriers for discharge:  Electrolytes corrected                                                            white count improving                                                Transition of care consulted            Consults called: none  Admission status:  ED Disposition    ED Disposition Condition [place]: [place] [[id number]]  Level of Care: Telemetry [5]  Admit to tele based on following criteria: Other see comments  Comments: covid  May admit patient to [facility] or [facility] if equivalent level of care is available:: No  Covid Evaluation: Confirmed COVID Positive  Diagnosis: Pneumonia due to COVID-19 virus [[id number]]  Admitting Physician: Toy Baker [[id number]]  Attending Physician: Toy Baker [[id number]]  Estimated length of stay: past midnight tomorrow  Certification:: I certify this patient will need inpatient services for at least 2 midnights          inpatient     I Expect 2 midnight stay secondary to severity of patient's current illness need for inpatient interventions justified by the following:  hemodynamic instability despite optimal treatment (  tachypnea  hypoxia,  )  Severe lab/radiological/exam abnormalities including:     and extensive comorbidities including:    CHF    dementia .   [name] Chronic anticoagulation  That are currently affecting medical management.   I expect  patient to be  hospitalized for 2 midnights requiring inpatient medical care.  Patient is at high risk for adverse outcome (such as loss of life or disability) if not treated.  Indication for inpatient stay as follows:    Hemodynamic instability despite maximal medical therapy,    New or worsening hypoxia  Need for IV antivirals  IV fluids,    Level of care    tele  indefinitely please discontinue once patient no longer qualifies COVID-19 Labs    No results found for: SARSCOV2NAA   Precautions: admitted as  covid positive Airborne and Contact precautions   PPE: Used by the provider:   P100  eye Goggles,  Gloves  gown    Emilee Market 06/02/2020, 11:38 PM    [facility]     after 2 AM please page floor coverage PA If 7AM-7PM, please contact the day team taking care of the patient using [email or web address]   Patient was evaluated in the context of the global COVID-19 pandemic, which necessitated consideration that the patient might be at risk for infection with the SARS-CoV-2 virus that causes COVID-19. Institutional protocols and algorithms that pertain to the evaluation of patients at risk for COVID-19 are in a state of rapid change based on information released by regulatory bodies including the CDC and federal and state organizations. These policies and algorithms were followed during the patient's care.

## 2020-06-02 NOTE — ED Notes (Signed)
Pt O2 sats maintaining between 87-90% on room air, RN notified.

## 2020-06-02 NOTE — ED Provider Notes (Signed)
[facility] DEPT Provider Note   CSN: [id number] Arrival date & time: 06/02/20  1652     History Chief Complaint  Patient presents with  . Weakness    Family stated a mild amount of confusion.    MARKEETA HOLTZER is a 87 y.o. female past medical history of dementia, hypertension, A. fib on Coumadin, PE, presenting to the emergency department from [place] living facility for weakness.  Patient endorses cough over the last few days.  EMS reports patient was very weak with transferring from chair to stretcher.  Also reported hypoxia at 88% on room air, placed on 2 L with improvement.  She denies shortness of breath, pain, abdominal pain, diarrhea or vomiting.  No known COVID exposures though she does live at a nursing facility.  She completed 2 COVID-19 vaccinations and a booster.  Of note, her medical provider recently started her on Klonopin 0.25 mg twice daily as needed for myoclonic jerking movements.  The history is provided by the patient and the EMS personnel.       Past Medical History:  Diagnosis Date  . Arthritis   . Atrial fibrillation ([place])   . Cataract    removed bilateral  . Clotting disorder ([place])    h/o blood clot left leg  . Dementia ([place])   . Depression   . DVT (deep venous thrombosis) ([place]) 2011  . GERD (gastroesophageal reflux disease)   . HB (heart block)    s/p PPM, most recent generator change 12/11 by JA  . Heart murmur   . Hiatal hernia   . Hyperlipidemia   . Hypertension   . Hypothyroidism   . Osteoporosis   . PTE (post-transplant erythrocytosis)   . Pulmonary embolism ([place]) 2011   on coumadin  . UTI (lower urinary tract infection)     Patient Active Problem List   Diagnosis Date Noted  . Osteoporosis 04/05/2020  . GERD (gastroesophageal reflux disease) 04/05/2020  . Mixed incontinence urge and stress 01/28/2019  . Acute gingivitis, plaque induced 10/15/2018  . Hyperlipidemia 10/15/2018  .  Diarrhea in adult patient 03/05/2018  . History of DVT (deep vein thrombosis) 09/02/2017  . Involuntary movements 09/02/2017  . Acquired hypothyroidism 09/02/2017  . Cognitive impairment 09/02/2017  . Myoclonic jerking 07/12/2016  . Encounter for therapeutic drug monitoring 11/23/2013  . Pacemaker-Medtronic 03/11/2012  . Edema 09/28/2011  . Long term (current) use of anticoagulants 05/23/2011  . Atypical chest pain 02/12/2011  . Fatigue 02/12/2011  . Rhinitis, chronic 01/17/2011  . Bronchitis, chronic ([place]) 12/15/2010  . Back pain 11/06/2010  . Hypotension 11/06/2010  . PAROXYSMAL ATRIAL FIBRILLATION 07/26/2010  . ATRIOVENTRICULAR BLOCK, 3RD DEGREE 04/12/2010  . DYSPHAGIA 12/14/2008    Past Surgical History:  Procedure Laterality Date  . BLADDER SURGERY     twice  . cataracts     bilateral  . CHOLECYSTECTOMY  09/2010  . COLONOSCOPY    . ESOPHAGOGASTRODUODENOSCOPY    . HEMORRHOID SURGERY    . hysterectomy (other)    . INNER EAR SURGERY    . JOINT REPLACEMENT    . pacemaker     most recent generator 12/11 by JA  . TOTAL KNEE ARTHROPLASTY Left      OB History   No obstetric history on file.     Family History  Problem Relation Age of Onset  . Stroke Father   . Heart disease Father   . Arthritis Father   . Heart disease Mother   .  Arthritis Mother   . Stomach cancer Brother   . Stomach cancer Brother   . Liver cancer Brother   . Breast cancer Sister   . Colon cancer Neg Hx   . Colon polyps Neg Hx   . Esophageal cancer Neg Hx   . Rectal cancer Neg Hx     Social History   Tobacco Use  . Smoking status: Never Smoker  . Smokeless tobacco: Never Used  Vaping Use  . Vaping Use: Never used  Substance Use Topics  . Alcohol use: No  . Drug use: No    Home Medications Prior to Admission medications   Medication Sig Start Date End Date Taking? Authorizing Provider  Calcium Carb-Cholecalciferol 600-800 MG-UNIT TABS Take 1 tablet by mouth daily.   Yes  [provider]  cholecalciferol (VITAMIN D) 1000 UNITS tablet Take 1,000 Units by mouth daily.    Yes [provider]  clonazePAM (KLONOPIN) 0.5 MG tablet Take 0.5 tablets (0.25 mg total) by mouth 2 (two) times daily as needed for anxiety. 05/25/20  Yes [name], MD  gabapentin (NEURONTIN) 300 MG capsule TAKE 1 CAPSULE TWICE DAILY Patient taking differently: Take 300 mg by mouth 2 (two) times daily. 02/08/20  Yes [name], MD  levothyroxine (SYNTHROID) 50 MCG tablet TAKE 1 TABLET (50 MCG TOTAL) BY MOUTH DAILY BEFORE BREAKFAST. 02/17/20  Yes [name], MD  loratadine (CLARITIN) 10 MG tablet Take 10 mg by mouth daily.   Yes [provider]  memantine (NAMENDA) 10 MG tablet TAKE 1 TABLET TWICE DAILY Patient taking differently: Take 10 mg by mouth 2 (two) times daily. 02/15/20  Yes [name], MD  Multiple Vitamins-Minerals (PRESERVISION AREDS 2 PO) Take 1 capsule by mouth 2 (two) times daily.   Yes [provider]  NITROSTAT 0.4 MG SL tablet Place 0.4 mg under the tongue every 5 (five) minutes as needed for chest pain (MAX 3 TABLETS).  02/09/11  Yes [provider]  omeprazole (PRILOSEC) 20 MG capsule TAKE 1 CAPSULE TWICE DAILY Patient taking differently: Take 20 mg by mouth 2 (two) times daily before a meal. 01/06/20  Yes [name], MD  potassium chloride (KLOR-CON) 20 MEQ packet Take 20 mEq by mouth daily.   Yes [provider]  pravastatin (PRAVACHOL) 20 MG tablet TAKE 1 TABLET EVERY DAY Patient taking differently: Take 20 mg by mouth daily. 05/23/20  Yes [name], MD  sertraline (ZOLOFT) 25 MG tablet TAKE 1 TABLET IN THE MORNING, AND 2 TABLETS IN THE EVENING AS DIRECTED. TOTAL 3 TABLETS DAILY. Patient taking differently: Take 25 mg by mouth in the morning and at bedtime. 02/01/20  Yes [name], MD  torsemide (DEMADEX) 20 MG tablet Take 20 mg by mouth daily.   Yes [provider]  warfarin  (COUMADIN) 5 MG tablet TAKE 1 TABLET DAILY OR AS DIRECTED BY THE COUMADIN CLINIC. Patient taking differently: Take 5 mg by mouth See admin instructions. Take 1 tablet daily or as directed by the Coumadin Clinic. 09/21/19  Yes Allred, Jeneen Rinks, MD  colestipol (COLESTID) 5 g granules Take 5 g by mouth daily with supper. 02/22/20 02/29/20  [name], MD    Allergies    Alendronate, Celebrex [celecoxib], Meloxicam, Myrbetriq [mirabegron], Norco [hydrocodone-acetaminophen], Pradaxa [dabigatran etexilate mesylate], Pradaxa [dabigatran], Toviaz [fesoterodine fumarate er], and Vesicare [solifenacin]  Review of Systems   Review of Systems  All other systems reviewed and are negative.   Physical Exam  Updated Vital Signs BP (!) 113/55   Pulse 83   Temp 99.7 F (37.6 C) (Oral)   Resp 20   Ht 5' (1.524 m)   Wt 60.8 kg   SpO2 92%   BMI 26.17 kg/m   Physical Exam Vitals and nursing note reviewed.  Constitutional:      General: She is not in acute distress.    Appearance: She is well-developed and well-nourished. She is not ill-appearing.  HENT:     Head: Normocephalic and atraumatic.  Eyes:     Conjunctiva/sclera: Conjunctivae normal.  Cardiovascular:     Rate and Rhythm: Normal rate and regular rhythm.     Heart sounds: Murmur heard.    Pulmonary:     Effort: Pulmonary effort is normal. No respiratory distress.     Comments: Fine rales in the bases Abdominal:     General: Bowel sounds are normal.     Palpations: Abdomen is soft.     Tenderness: There is abdominal tenderness (Left lower quadrant). There is no guarding or rebound.  Musculoskeletal:     Right lower leg: Edema present.     Left lower leg: Edema present.  Skin:    General: Skin is warm.  Neurological:     Mental Status: She is alert.     Comments: Alert and oriented to person, building, town, year. Spontaneously moving all extremities without difficulty.  Psychiatric:        Mood and Affect: Mood and affect  normal.        Behavior: Behavior normal.     ED Results / Procedures / Treatments   Labs (all labs ordered are listed, but only abnormal results are displayed) Labs Reviewed  COMPREHENSIVE METABOLIC PANEL - Abnormal; Notable for the following components:      Result Value   Glucose, Bld 119 (*)    Calcium 8.7 (*)    GFR, Estimated 57 (*)    All other components within normal limits  CBC WITH DIFFERENTIAL/PLATELET - Abnormal; Notable for the following components:   WBC 14.0 (*)    Neutro Abs 11.5 (*)    Abs Immature Granulocytes 0.13 (*)    All other components within normal limits  PROTIME-INR - Abnormal; Notable for the following components:   Prothrombin Time 23.5 (*)    INR 2.2 (*)    All other components within normal limits  POC SARS CORONAVIRUS 2 AG -  ED - Abnormal; Notable for the following components:   SARS Coronavirus 2 Ag POSITIVE (*)    All other components within normal limits  URINE CULTURE  LACTIC ACID, PLASMA  LACTIC ACID, PLASMA  URINALYSIS, ROUTINE W REFLEX MICROSCOPIC  POC SARS CORONAVIRUS 2 AG -  ED    EKG None  Radiology DG Chest Port 1 View  Result Date: 06/02/2020 CLINICAL DATA:  Hypoxia EXAM: PORTABLE CHEST 1 VIEW COMPARISON:  11/01/2013 FINDINGS: Left-sided pacing device as before. The right lung is grossly clear. Borderline cardiomegaly with aortic atherosclerosis. Patchy opacity left base. No pneumothorax IMPRESSION: Patchy atelectasis or infiltrate at the left base. Electronically Signed   By: Donavan Foil M.D.   On: 06/02/2020 19:05   CUP PACEART REMOTE DEVICE CHECK  Result Date: 06/02/2020 Scheduled remote reviewed. Normal device function.  Atrial arrhythmias were all less than one minute Next remote 91 days. Kathy Breach, RN, CCDS, [employer]   Procedures Procedures (including critical care time)  Medications Ordered in ED Medications  remdesivir 200 mg in sodium  chloride 0.9% 250 mL IVPB (has no administration in time  range)    Followed by  remdesivir 100 mg in sodium chloride 0.9 % 100 mL IVPB (has no administration in time range)    ED Course  I have reviewed the triage vital signs and the nursing notes.  Pertinent labs & imaging results that were available during my care of the patient were reviewed by me and considered in my medical decision making (see chart for details).    MDM Rules/Calculators/A&P                          [facility] presenting from [facility] [place] [facility] with generalized weakness and hypoxia, improved on 2 L.  She endorses cough over the last few days.  Temp is 100 F. She is alert and oriented x4 on evaluation, normal work of breathing. She is satting low 90s on 1.5L, increased to 2L. COVId swab ordered, labs including UA. CXR. EKG.  CXR with infiltrated in left base, will hold abx at this time pending COVID test result.  Labs with positive POC covid Ag test. Leukocytosis of 14, hgb stable. CMP without significant change from baseline. Lactic acid wnl. INR therapeutic at 2.2. Pt will require admission for hypoxia in the setting of COVID 19 illness, requiring 2L [place] at time of admission.  HOLDYN DAS was evaluated in Emergency Department on 06/02/2020 for the symptoms described in the history of present illness. She was evaluated in the context of the global COVID-19 pandemic, which necessitated consideration that the patient might be at risk for infection with the SARS-CoV-2 virus that causes COVID-19. Institutional protocols and algorithms that pertain to the evaluation of patients at risk for COVID-19 are in a state of rapid change based on information released by regulatory bodies including the CDC and federal and state organizations. These policies and algorithms were followed during the patient's care in the ED.  Final Clinical Impression(s) / ED Diagnoses Final diagnoses:  Acute respiratory failure due to COVID-19 [facility])    Rx / DC Orders ED Discharge Orders     None       Lizbett Garciagarcia, [place] N, PA-C 06/02/20 2231    Lucrezia Starch, MD 06/03/20 1623

## 2020-06-02 NOTE — ED Notes (Signed)
Daughter [name] can be contacted for any questions/ information needed anytime. [phone]

## 2020-06-02 NOTE — H&P (Incomplete)
[name]:[id number] DOB: [birth year] DOA: 06/02/2020     PCP: [name], MD   Outpatient Specialists:   CARDS:     Dr. Rayann Heman    Patient arrived to ER on 06/02/20 at 1652 Referred by Attending Lucrezia Starch, MD   Patient coming from:  [facility] [place]  Chief Complaint:   Chief Complaint  Patient presents with  . Weakness    Family stated a mild amount of confusion.    HPI: [name] is a 87 y.o. female with medical history significant of dementia, hypertension, A. fib on Coumadin, PE/DVT 2011,  GERD, HLD, HTN, hypothyroidism, myoclonic jerking, status post pacemaker    Presented with  generalized weakness cough for past 1 wk.  Patient was too weak to transfer from bed to chair.  This is not normal for her.  Noted to have hypoxia down to 88% on room air started 2 L per EMS. She has history of myoclonic jerking and has recently started on Klonopin Hx of chronic diarrhea Family reports confusion She uses a walker at baseline  Denies any sick contacts They have been around daughter and son-in-law  Infectious risk factors:  Reports, shortness of breath,     Has  been vaccinated against COVID and boosted March 28, 2020   Initial COVID TEST  Rapid test  POSITIVE,   No results found for: SARSCOV2NAA   Regarding pertinent Chronic problems:    Hyperlipidemia -  on statins Pravachol Lipid Panel     Component Value Date/Time   CHOL 158 10/15/2019 0000   CHOL 198 03/19/2017 0000   TRIG 116 10/15/2019 0000   TRIG 130 03/19/2017 0000   HDL 60 10/15/2019 0000   CHOLHDL 2.6 10/15/2019 0000   VLDL 18 08/25/2009 0440   LDLCALC 78 10/15/2019 0000   LDLCALC 111 03/19/2017 0000     HTN on torsemide   chronic CHF diastolic - last echo 0459 Torsemide    Hypothyroidism:  Lab Results  Component Value Date   TSH 3.42 04/25/2020   on synthroid   A. Fib -  - CHA2DS2 vas score     7    current  on anticoagulation with   Coumadin  ,           Dementia - on Nemenda   Myoclonic jerks recently started on Klonopin   While in ER: Noted to be positive for COVID Febrile up to 100 Elevated white blood cell count at 14 Chest x-ray showing infiltrate in the left base  Started on Remdesivir   Hospitalist was called for admission for COVID-pneumonia and acute respiratory failure hypoxia  The following Work up has been ordered so far:  Orders Placed This Encounter  Procedures  . Urine culture  . DG Chest Port 1 View  . Comprehensive metabolic panel  . CBC with Differential  . Lactic acid, plasma  . Urinalysis, Routine w reflex microscopic Urine, Clean Catch  . Protime-INR  . Consult to hospitalist  ALL PATIENTS BEING ADMITTED/HAVING PROCEDURES NEED COVID-19 SCREENING  . Airborne and Contact precautions  . POC SARS Coronavirus 2 Ag-ED - Nasal Swab (BD Veritor Kit)  . POC SARS Coronavirus 2 Ag-ED - Nasal Swab (BD Veritor Kit)  . ED EKG    Following Medications were ordered in ER: Medications  remdesivir 200 mg in sodium chloride 0.9% 250 mL IVPB (has no administration in time range)    Followed by  remdesivir 100 mg  in sodium chloride 0.9 % 100 mL IVPB (has no administration in time range)        Consult Orders  (From admission, onward)         Start     Ordered   06/02/20 2210  Consult to hospitalist  ALL PATIENTS BEING ADMITTED/HAVING PROCEDURES NEED COVID-19 SCREENING  Once       Comments: ALL PATIENTS BEING ADMITTED/HAVING PROCEDURES NEED COVID-19 SCREENING  Provider:  (Not yet assigned)  Question Answer Comment  Place call to: [facility]   Reason for Consult Admit      06/02/20 2209           Significant initial  Findings: Abnormal Labs Reviewed  COMPREHENSIVE METABOLIC PANEL - Abnormal; Notable for the following components:      Result Value   Glucose, Bld 119 (*)    Calcium 8.7 (*)    GFR, Estimated 57 (*)    All other components within normal limits  CBC WITH  DIFFERENTIAL/PLATELET - Abnormal; Notable for the following components:   WBC 14.0 (*)    Neutro Abs 11.5 (*)    Abs Immature Granulocytes 0.13 (*)    All other components within normal limits  PROTIME-INR - Abnormal; Notable for the following components:   Prothrombin Time 23.5 (*)    INR 2.2 (*)    All other components within normal limits  POC SARS CORONAVIRUS 2 AG -  ED - Abnormal; Notable for the following components:   SARS Coronavirus 2 Ag POSITIVE (*)    All other components within normal limits   Otherwise labs showing:    Recent Labs  Lab 06/02/20 1900  NA 142  K 3.9  CO2 26  GLUCOSE 119*  BUN 22  CREATININE 0.93  CALCIUM 8.7*    Cr    stable,    Lab Results  Component Value Date   CREATININE 0.93 06/02/2020   CREATININE 0.95 (H) 04/25/2020   CREATININE 0.77 03/31/2020    Recent Labs  Lab 06/02/20 1900  AST 18  ALT 14  ALKPHOS 68  BILITOT 0.4  PROT 6.5  ALBUMIN 3.6   Lab Results  Component Value Date   CALCIUM 8.7 (L) 06/02/2020     WBC      Component Value Date/Time   WBC 14.0 (H) 06/02/2020 1900   LYMPHSABS 1.5 06/02/2020 1900   MONOABS 0.8 06/02/2020 1900   EOSABS 0.0 06/02/2020 1900   BASOSABS 0.0 06/02/2020 1900    Plt: Lab Results  Component Value Date   PLT 171 06/02/2020    Lactic Acid, Venous    Component Value Date/Time   LATICACIDVEN 1.0 06/02/2020 1900    Procalcitonin *** Ordered   COVID-19 Labs  No results for input(s): DDIMER, FERRITIN, LDH, CRP in the last 72 hours.  No results found for: SARSCOV2NAA    HG/HCT  stable,      Component Value Date/Time   HGB 12.1 06/02/2020 1900   HGB 13.1 03/19/2017 0000   HCT 38.7 06/02/2020 1900   HCT 41.2 03/19/2017 0000   MCV 93.7 06/02/2020 1900   MCV 95.6 03/19/2017 0000    No results for input(s): LIPASE, AMYLASE in the last 168 hours. No results for input(s): AMMONIA in the last 168 hours.    Troponin ***ordered Cardiac Panel (last 3 results) No results for  input(s): CKTOTAL, CKMB, TROPONINI, RELINDX in the last 72 hours.     ECG: Ordered Personally reviewed by me showing: HR : 92 Rhythm:  LBBB  Paced   QTC 525   BNP (last 3 results) No results for input(s): BNP in the last 8760 hours.    DM  labs:  HbA1C: No results for input(s): HGBA1C in the last 8760 hours.     CBG (last 3)  No results for input(s): GLUCAP in the last 72 hours.     UA no evidence of UTI   Urine analysis:    Component Value Date/Time   COLORURINE YELLOW 06/02/2020 2120   APPEARANCEUR HAZY (A) 06/02/2020 2120   LABSPEC 1.013 06/02/2020 2120   PHURINE 6.0 06/02/2020 2120   GLUCOSEU NEGATIVE 06/02/2020 2120   HGBUR NEGATIVE 06/02/2020 2120   BILIRUBINUR NEGATIVE 06/02/2020 2120   KETONESUR NEGATIVE 06/02/2020 2120   PROTEINUR NEGATIVE 06/02/2020 2120   UROBILINOGEN 0.2 11/01/2013 1010   NITRITE NEGATIVE 06/02/2020 2120   LEUKOCYTESUR TRACE (A) 06/02/2020 2120    Ordered  CXR - infiltrate vs atelectasis    ED Triage Vitals  Enc Vitals Group     BP 06/02/20 1723 126/68     Pulse Rate 06/02/20 1723 (!) 102     Resp 06/02/20 1723 (!) 23     Temp 06/02/20 1723 100 F (37.8 C)     Temp Source 06/02/20 1723 Oral     SpO2 06/02/20 1723 90 %     Weight 06/02/20 1808 134 lb (60.8 kg)     Height 06/02/20 1808 5' (1.524 m)     Head Circumference --      Peak Flow --      Pain Score 06/02/20 1808 0     Pain Loc --      Pain Edu? --      Excl. in [place]? --   TMAX(24)@       Latest  Blood pressure (!) 113/55, pulse 83, temperature 99.7 F (37.6 C), temperature source Oral, resp. rate 20, height 5' (1.524 m), weight 60.8 kg, SpO2 92 %.   Review of Systems:    Pertinent positives include: chills, fatigue,   Constitutional:  No weight loss, night sweats, Fevers, weight loss  HEENT:  No headaches, Difficulty swallowing,Tooth/dental problems,Sore throat,  No sneezing, itching, ear ache, nasal congestion, post nasal drip,  Cardio-vascular:  No chest  pain, Orthopnea, PND, anasarca, dizziness, palpitations.no Bilateral lower extremity swelling  GI:  No heartburn, indigestion, abdominal pain, nausea, vomiting, diarrhea, change in bowel habits, loss of appetite, melena, blood in stool, hematemesis Resp:  no shortness of breath at rest. No dyspnea on exertion, No excess mucus, no productive cough, No non-productive cough, No coughing up of blood.No change in color of mucus.No wheezing. Skin:  no rash or lesions. No jaundice GU:  no dysuria, change in color of urine, no urgency or frequency. No straining to urinate.  No flank pain.  Musculoskeletal:  No joint pain or no joint swelling. No decreased range of motion. No back pain.  Psych:  No change in mood or affect. No depression or anxiety. No memory loss.  Neuro: no localizing neurological complaints, no tingling, no weakness, no double vision, no gait abnormality, no slurred speech, no confusion  All systems reviewed and apart from [place] all are negative  Past Medical History:   Past Medical History:  Diagnosis Date  . Arthritis   . Atrial fibrillation ([place])   . Cataract    removed bilateral  . Clotting disorder ([place])    h/o blood clot left leg  . Dementia ([place])   . Depression   .  DVT (deep venous thrombosis) ([place]) 2011  . GERD (gastroesophageal reflux disease)   . HB (heart block)    s/p PPM, most recent generator change 12/11 by JA  . Heart murmur   . Hiatal hernia   . Hyperlipidemia   . Hypertension   . Hypothyroidism   . Osteoporosis   . PTE (post-transplant erythrocytosis)   . Pulmonary embolism ([place]) 2011   on coumadin  . UTI (lower urinary tract infection)      Past Surgical History:  Procedure Laterality Date  . BLADDER SURGERY     twice  . cataracts     bilateral  . CHOLECYSTECTOMY  09/2010  . COLONOSCOPY    . ESOPHAGOGASTRODUODENOSCOPY    . HEMORRHOID SURGERY    . hysterectomy (other)    . INNER EAR SURGERY    . JOINT REPLACEMENT    . pacemaker      most recent generator 12/11 by JA  . TOTAL KNEE ARTHROPLASTY Left     Social History:  Ambulatory  walker      reports that she has never smoked. She has never used smokeless tobacco. She reports that she does not drink alcohol and does not use drugs.   Family History:   Family History  Problem Relation Age of Onset  . Stroke Father   . Heart disease Father   . Arthritis Father   . Heart disease Mother   . Arthritis Mother   . Stomach cancer Brother   . Stomach cancer Brother   . Liver cancer Brother   . Breast cancer Sister   . Colon cancer Neg Hx   . Colon polyps Neg Hx   . Esophageal cancer Neg Hx   . Rectal cancer Neg Hx     Allergies: Allergies  Allergen Reactions  . Alendronate Other (See Comments)    Can not tolerate  . Celebrex [Celecoxib] Other (See Comments)    Can not tolerate  . Meloxicam Other (See Comments)    Avoid   . Myrbetriq [Mirabegron] Other (See Comments)    Can not tolerate  . Norco [Hydrocodone-Acetaminophen] Other (See Comments)    Dizziness and jerking  . Pradaxa [Dabigatran Etexilate Mesylate] Other (See Comments)    Can not tolerate  . Pradaxa [Dabigatran]   . Toviaz [Fesoterodine Fumarate Er] Other (See Comments)    Can not tolerate  . Vesicare [Solifenacin] Other (See Comments)    Can not tolerate     Prior to Admission medications   Medication Sig Start Date End Date Taking? Authorizing Provider  Calcium Carb-Cholecalciferol 600-800 MG-UNIT TABS Take 1 tablet by mouth daily.   Yes [provider]  cholecalciferol (VITAMIN D) 1000 UNITS tablet Take 1,000 Units by mouth daily.    Yes [provider]  clonazePAM (KLONOPIN) 0.5 MG tablet Take 0.5 tablets (0.25 mg total) by mouth 2 (two) times daily as needed for anxiety. 05/25/20  Yes [name], MD  gabapentin (NEURONTIN) 300 MG capsule TAKE 1 CAPSULE TWICE DAILY Patient taking differently: Take 300 mg by mouth 2 (two) times daily. 02/08/20  Yes [name], MD  levothyroxine (SYNTHROID) 50 MCG tablet TAKE 1 TABLET (50 MCG TOTAL) BY MOUTH DAILY BEFORE BREAKFAST. 02/17/20  Yes [name], MD  loratadine (CLARITIN) 10 MG tablet Take 10 mg by mouth daily.   Yes [provider]  memantine (NAMENDA) 10 MG tablet TAKE 1 TABLET TWICE DAILY Patient taking differently: Take 10 mg by mouth 2 (two) times  daily. 02/15/20  Yes [name], MD  Multiple Vitamins-Minerals (PRESERVISION AREDS 2 PO) Take 1 capsule by mouth 2 (two) times daily.   Yes [provider]  NITROSTAT 0.4 MG SL tablet Place 0.4 mg under the tongue every 5 (five) minutes as needed for chest pain (MAX 3 TABLETS).  02/09/11  Yes [provider]  omeprazole (PRILOSEC) 20 MG capsule TAKE 1 CAPSULE TWICE DAILY Patient taking differently: Take 20 mg by mouth 2 (two) times daily before a meal. 01/06/20  Yes [name], MD  potassium chloride (KLOR-CON) 20 MEQ packet Take 20 mEq by mouth daily.   Yes [provider]  pravastatin (PRAVACHOL) 20 MG tablet TAKE 1 TABLET EVERY DAY Patient taking differently: Take 20 mg by mouth daily. 05/23/20  Yes [name], MD  sertraline (ZOLOFT) 25 MG tablet TAKE 1 TABLET IN THE MORNING, AND 2 TABLETS IN THE EVENING AS DIRECTED. TOTAL 3 TABLETS DAILY. Patient taking differently: Take 25 mg by mouth in the morning and at bedtime. 02/01/20  Yes [name], MD  torsemide (DEMADEX) 20 MG tablet Take 20 mg by mouth daily.   Yes [provider]  warfarin (COUMADIN) 5 MG tablet TAKE 1 TABLET DAILY OR AS DIRECTED BY THE COUMADIN CLINIC. Patient taking differently: Take 5 mg by mouth See admin instructions. Take 1 tablet daily or as directed by the Coumadin Clinic. 09/21/19  Yes Allred, Jeneen Rinks, MD  colestipol (COLESTID) 5 g granules Take 5 g by mouth daily with supper. 02/22/20 02/29/20  [name], MD   Physical Exam: Vitals with BMI 06/02/2020 06/02/2020 06/02/2020  Height - - -  Weight - - -  BMI -  - -  Systolic 540 86 086  Diastolic 55 69 54  Pulse 83 84 84     1. General:  in No Acute distress   Chronically ill -appearing 2. Psychological: Alert and   Oriented 3. Head/ENT:    Dry Mucous Membranes                          Head Non traumatic, neck supple                          Poor Dentition 4. SKIN:  decreased Skin turgor,  Skin clean Dry and intact no rash 5. Heart: Regular rate and rhythm no*** Murmur, no Rub or gallop 6. Lungs: ***Clear to auscultation bilaterally, no wheezes or crackles   7. Abdomen: Soft, ***non-tender, Non distended *** obese ***bowel sounds present 8. Lower extremities: no clubbing, cyanosis, no ***edema 9. Neurologically Grossly intact, moving all 4 extremities equally  10. MSK: Normal range of motion   All other LABS:     Recent Labs  Lab 06/02/20 1900  WBC 14.0*  NEUTROABS 11.5*  HGB 12.1  HCT 38.7  MCV 93.7  PLT 171     Recent Labs  Lab 06/02/20 1900  NA 142  K 3.9  CL 105  CO2 26  GLUCOSE 119*  BUN 22  CREATININE 0.93  CALCIUM 8.7*     Recent Labs  Lab 06/02/20 1900  AST 18  ALT 14  ALKPHOS 68  BILITOT 0.4  PROT 6.5  ALBUMIN 3.6     Cultures:    Component Value Date/Time   SDES URINE, CLEAN CATCH 11/01/2013 1010   SPECREQUEST NONE 11/01/2013 1010   CULT  11/01/2013 1010  KLEBSIELLA OXYTOCA Performed at [facility] 11/04/2013 FINAL 11/01/2013 1010     Radiological Exams on Admission: DG Chest Port 1 View  Result Date: 06/02/2020 CLINICAL DATA:  Hypoxia EXAM: PORTABLE CHEST 1 VIEW COMPARISON:  11/01/2013 FINDINGS: Left-sided pacing device as before. The right lung is grossly clear. Borderline cardiomegaly with aortic atherosclerosis. Patchy opacity left base. No pneumothorax IMPRESSION: Patchy atelectasis or infiltrate at the left base. Electronically Signed   By: Donavan Foil M.D.   On: 06/02/2020 19:05   CUP PACEART REMOTE DEVICE CHECK  Result Date: 06/02/2020 Scheduled remote  reviewed. Normal device function.  Atrial arrhythmias were all less than one minute Next remote 91 days. Kathy Breach, RN, CCDS, [employer]   Chart has been reviewed    Assessment/Plan  87 y.o. female with medical history significant of dementia, hypertension, A. fib on Coumadin, PE/DVT 2011,, GERD, HLD, HTN, hypothyroidism, myoclonic jerking, status post pacemaker Admitted for  Covid Pneumonia and acute respiratory failure  Present on Admission: . Pneumonia due to COVID-19 virus -patient noted to have mild hypoxia requiring 2 L Evidence of pneumonia on chest x-ray Patient is fully vaccinated and boosted. Inflammatory markers are currently pending Initiated remdesivir and Decadron Pronate as able to tolerate Discussed with family at this point they would like for her to be DO NOT RESUSCITATE DO NOT INTUBATE  . PAROXYSMAL ATRIAL FIBRILLATION -           - CHA2DS2 vas score 7  : continue current anticoagulation with  Coumadin per pharmacy,   . Pacemaker-[employer] . Myoclonic jerking . Hyperlipidemia . GERD (gastroesophageal reflux disease) . Diarrhea in adult patient . Cognitive impairment . Acquired hypothyroidism . Chronic diastolic CHF (congestive heart failure) ([place]) . Acute respiratory failure with hypoxia ([facility])   Other plan as per orders.  DVT prophylaxis: on coumadin    Code Status:    Code Status: Not on file  DNR/DNI   as per family  I had personally discussed CODE STATUS with patient and family    Family Communication:   Family not at  Bedside  plan of care was discussed on the phone with   Daughter,  Husband,   Disposition Plan:                              Back to current facility when stable                              Following barriers for discharge:                            Electrolytes corrected                                                            white count improving                                                Transition of  care consulted  Consults called: none  Admission status:  ED Disposition    ED Disposition Condition [place]: [facility] [100102]  Level of Care: Telemetry [5]  Admit to tele based on following criteria: Other see comments  Comments: covid  May admit patient to [facility] or [facility] if equivalent level of care is available:: No  Covid Evaluation: Confirmed COVID Positive  Diagnosis: Pneumonia due to COVID-19 virus [[id number]]  Admitting Physician: Toy Baker [[id number]]  Attending Physician: Toy Baker [[id number]]  Estimated length of stay: past midnight tomorrow  Certification:: I certify this patient will need inpatient services for at least 2 midnights          inpatient     I Expect 2 midnight stay secondary to severity of patient's current illness need for inpatient interventions justified by the following:  hemodynamic instability despite optimal treatment (  tachypnea  hypoxia,  )  Severe lab/radiological/exam abnormalities including:     and extensive comorbidities including:    CHF    dementia .   [name] Chronic anticoagulation  That are currently affecting medical management.   I expect  patient to be hospitalized for 2 midnights requiring inpatient medical care.  Patient is at high risk for adverse outcome (such as loss of life or disability) if not treated.  Indication for inpatient stay as follows:    Hemodynamic instability despite maximal medical therapy,    New or worsening hypoxia  Need for IV antivirals  IV fluids,    Level of care    tele  indefinitely please discontinue once patient no longer qualifies COVID-19 Labs    No results found for: SARSCOV2NAA   Precautions: admitted as  covid positive Airborne and Contact precautions   PPE: Used by the provider:   P100  eye Goggles,  Gloves  gown    Anothony Bursch 06/02/2020, 11:38 PM    [facility]     after 2  AM please page floor coverage PA If 7AM-7PM, please contact the day team taking care of the patient using [email or web address]   Patient was evaluated in the context of the global COVID-19 pandemic, which necessitated consideration that the patient might be at risk for infection with the SARS-CoV-2 virus that causes COVID-19. Institutional protocols and algorithms that pertain to the evaluation of patients at risk for COVID-19 are in a state of rapid change based on information released by regulatory bodies including the CDC and federal and state organizations. These policies and algorithms were followed during the patient's care.

## 2020-06-02 NOTE — ED Notes (Signed)
Please dont forget pt's eyeglasses on counter left side of room

## 2020-06-02 NOTE — Telephone Encounter (Signed)
Called pt since she is overdue for INR check at [facility]. Spoke with husband and he states they did not go on Monday and the people are gone at this time for Thursday check. Husband states they can go on the upcoming Monday. He states he is writing it down since he didn't have anything on his calendar for this week regarding it.

## 2020-06-02 NOTE — Telephone Encounter (Signed)
Patient husband, Bobby Rumpf, called and stated that Dr. Lyndel Safe gave patient some medication called Clonazepam. Stated that patient has taken a total of 2 doses of this medication and now is so weak that she cannot get up and is not clear in her mind.  Stated that patient is not doing good at all. I asked if she was responsive and he stated alittle.  I instructed patient to call 911 to have patient evaluated. He agreed.

## 2020-06-02 NOTE — ED Triage Notes (Signed)
Patient BIB [facility] with generalized weakness and a short period of confusion  ( stated by patient's husband). EMS reports patient was very weak and had an unsteady gait pivoting from chair to stretcher. Patient denies, pain, SOB or dizziness. Per EMS patient has been A&O x 3 since on scene.

## 2020-06-03 ENCOUNTER — Encounter (HOSPITAL_COMMUNITY): Payer: Self-pay | Admitting: Internal Medicine

## 2020-06-03 ENCOUNTER — Telehealth: Payer: Self-pay | Admitting: *Deleted

## 2020-06-03 ENCOUNTER — Other Ambulatory Visit: Payer: Self-pay

## 2020-06-03 DIAGNOSIS — R4189 Other symptoms and signs involving cognitive functions and awareness: Secondary | ICD-10-CM | POA: Diagnosis not present

## 2020-06-03 DIAGNOSIS — I4891 Unspecified atrial fibrillation: Secondary | ICD-10-CM | POA: Diagnosis not present

## 2020-06-03 DIAGNOSIS — J9601 Acute respiratory failure with hypoxia: Secondary | ICD-10-CM | POA: Diagnosis not present

## 2020-06-03 DIAGNOSIS — U071 COVID-19: Secondary | ICD-10-CM | POA: Diagnosis not present

## 2020-06-03 LAB — CBC WITH DIFFERENTIAL/PLATELET
Abs Immature Granulocytes: 0.08 10*3/uL — ABNORMAL HIGH (ref 0.00–0.07)
Basophils Absolute: 0 10*3/uL (ref 0.0–0.1)
Basophils Relative: 0 %
Eosinophils Absolute: 0 10*3/uL (ref 0.0–0.5)
Eosinophils Relative: 0 %
HCT: 36 % (ref 36.0–46.0)
Hemoglobin: 11.1 g/dL — ABNORMAL LOW (ref 12.0–15.0)
Immature Granulocytes: 1 %
Lymphocytes Relative: 14 %
Lymphs Abs: 1.5 10*3/uL (ref 0.7–4.0)
MCH: 28.9 pg (ref 26.0–34.0)
MCHC: 30.8 g/dL (ref 30.0–36.0)
MCV: 93.8 fL (ref 80.0–100.0)
Monocytes Absolute: 0.5 10*3/uL (ref 0.1–1.0)
Monocytes Relative: 5 %
Neutro Abs: 8.5 10*3/uL — ABNORMAL HIGH (ref 1.7–7.7)
Neutrophils Relative %: 80 %
Platelets: 153 10*3/uL (ref 150–400)
RBC: 3.84 MIL/uL — ABNORMAL LOW (ref 3.87–5.11)
RDW: 14.6 % (ref 11.5–15.5)
WBC: 10.6 10*3/uL — ABNORMAL HIGH (ref 4.0–10.5)
nRBC: 0 % (ref 0.0–0.2)

## 2020-06-03 LAB — COMPREHENSIVE METABOLIC PANEL
ALT: 13 U/L (ref 0–44)
ALT: 13 U/L (ref 0–44)
AST: 29 U/L (ref 15–41)
AST: 14 U/L — ABNORMAL LOW (ref 15–41)
Albumin: 3.3 g/dL — ABNORMAL LOW (ref 3.5–5.0)
Albumin: 3.1 g/dL — ABNORMAL LOW (ref 3.5–5.0)
Alkaline Phosphatase: 60 U/L (ref 38–126)
Alkaline Phosphatase: 64 U/L (ref 38–126)
Anion gap: 14 (ref 5–15)
Anion gap: 10 (ref 5–15)
BUN: 22 mg/dL (ref 8–23)
BUN: 23 mg/dL (ref 8–23)
CO2: 22 mmol/L (ref 22–32)
CO2: 26 mmol/L (ref 22–32)
Calcium: 8.4 mg/dL — ABNORMAL LOW (ref 8.9–10.3)
Calcium: 8.4 mg/dL — ABNORMAL LOW (ref 8.9–10.3)
Chloride: 105 mmol/L (ref 98–111)
Chloride: 106 mmol/L (ref 98–111)
Creatinine, Ser: 1 mg/dL (ref 0.44–1.00)
Creatinine, Ser: 0.83 mg/dL (ref 0.44–1.00)
GFR, Estimated: 52 mL/min — ABNORMAL LOW (ref >60)
GFR, Estimated: >60 mL/min (ref >60)
Glucose, Bld: 102 mg/dL — ABNORMAL HIGH (ref 70–99)
Glucose, Bld: 113 mg/dL — ABNORMAL HIGH (ref 70–99)
Potassium: 4.4 mmol/L (ref 3.5–5.1)
Potassium: 3.5 mmol/L (ref 3.5–5.1)
Sodium: 141 mmol/L (ref 135–145)
Sodium: 142 mmol/L (ref 135–145)
Total Bilirubin: 0.4 mg/dL (ref 0.3–1.2)
Total Bilirubin: 0.4 mg/dL (ref 0.3–1.2)
Total Protein: 6.1 g/dL — ABNORMAL LOW (ref 6.5–8.1)
Total Protein: 5.9 g/dL — ABNORMAL LOW (ref 6.5–8.1)

## 2020-06-03 LAB — D-DIMER, QUANTITATIVE
D-Dimer, Quant: 1.11 ug/mL-FEU — ABNORMAL HIGH (ref 0.00–0.50)
D-Dimer, Quant: 0.38 ug/mL-FEU (ref 0.00–0.50)

## 2020-06-03 LAB — MAGNESIUM: Magnesium: 2 mg/dL (ref 1.7–2.4)

## 2020-06-03 LAB — TROPONIN I (HIGH SENSITIVITY)
Troponin I (High Sensitivity): 20 ng/L — ABNORMAL HIGH (ref <18)
Troponin I (High Sensitivity): 20 ng/L — ABNORMAL HIGH (ref <18)

## 2020-06-03 LAB — FERRITIN
Ferritin: 25 ng/mL (ref 11–307)
Ferritin: 27 ng/mL (ref 11–307)

## 2020-06-03 LAB — PROCALCITONIN: Procalcitonin: 0.16 ng/mL

## 2020-06-03 LAB — C-REACTIVE PROTEIN
CRP: 6.9 mg/dL — ABNORMAL HIGH (ref <1.0)
CRP: 9 mg/dL — ABNORMAL HIGH (ref <1.0)

## 2020-06-03 LAB — LACTATE DEHYDROGENASE: LDH: 332 U/L — ABNORMAL HIGH (ref 98–192)

## 2020-06-03 LAB — FIBRINOGEN: Fibrinogen: 459 mg/dL (ref 210–475)

## 2020-06-03 LAB — TRIGLYCERIDES: Triglycerides: 76 mg/dL (ref <150)

## 2020-06-03 MED ORDER — MEMANTINE HCL 10 MG PO TABS
10.0000 mg | ORAL_TABLET | Freq: Two times a day (BID) | ORAL | Status: DC
  Administered 2020-06-03 – 2020-06-06 (×8): 10 mg via ORAL
  Filled 2020-06-03 (×4): qty 1
  Filled 2020-06-03 (×2): qty 2
  Filled 2020-06-03 (×2): qty 1

## 2020-06-03 MED ORDER — WARFARIN - PHARMACIST DOSING INPATIENT: Freq: Every day | Status: DC

## 2020-06-03 MED ORDER — SERTRALINE HCL 25 MG PO TABS
25.0000 mg | ORAL_TABLET | Freq: Two times a day (BID) | ORAL | Status: DC
  Administered 2020-06-03 – 2020-06-06 (×6): 25 mg via ORAL
  Filled 2020-06-03 (×6): qty 1

## 2020-06-03 MED ORDER — GABAPENTIN 300 MG PO CAPS
300.0000 mg | ORAL_CAPSULE | Freq: Two times a day (BID) | ORAL | Status: DC
  Administered 2020-06-03 – 2020-06-06 (×8): 300 mg via ORAL
  Filled 2020-06-03 (×8): qty 1

## 2020-06-03 MED ORDER — SODIUM CHLORIDE 0.9 % IV SOLN: 250.0000 mL | INTRAVENOUS | Status: DC | PRN

## 2020-06-03 MED ORDER — GUAIFENESIN-DM 100-10 MG/5ML PO SYRP
10.0000 mL | ORAL_SOLUTION | ORAL | Status: DC | PRN
  Administered 2020-06-04: 10 mL via ORAL
  Filled 2020-06-03 (×2): qty 10

## 2020-06-03 MED ORDER — ONDANSETRON HCL 4 MG/2ML IJ SOLN: 4.0000 mg | Freq: Four times a day (QID) | INTRAMUSCULAR | Status: DC | PRN

## 2020-06-03 MED ORDER — SODIUM CHLORIDE 0.9% FLUSH
3.0000 mL | INTRAVENOUS | Status: DC | PRN
Start: 2020-06-03 — End: 2020-06-06
  Administered 2020-06-03: 3 mL via INTRAVENOUS

## 2020-06-03 MED ORDER — SODIUM CHLORIDE 0.9% FLUSH
3.0000 mL | Freq: Two times a day (BID) | INTRAVENOUS | Status: DC
  Administered 2020-06-03 – 2020-06-05 (×7): 3 mL via INTRAVENOUS

## 2020-06-03 MED ORDER — WARFARIN SODIUM 5 MG PO TABS
5.0000 mg | ORAL_TABLET | Freq: Once | ORAL | Status: AC
  Administered 2020-06-03: 5 mg via ORAL
  Filled 2020-06-03 (×2): qty 1

## 2020-06-03 MED ORDER — PANTOPRAZOLE SODIUM 40 MG PO TBEC
40.0000 mg | DELAYED_RELEASE_TABLET | Freq: Every day | ORAL | Status: DC
  Administered 2020-06-03 – 2020-06-06 (×4): 40 mg via ORAL
  Filled 2020-06-03 (×4): qty 1

## 2020-06-03 MED ORDER — ACETAMINOPHEN 325 MG PO TABS
650.0000 mg | ORAL_TABLET | Freq: Four times a day (QID) | ORAL | Status: DC | PRN
  Administered 2020-06-03 – 2020-06-04 (×3): 650 mg via ORAL
  Filled 2020-06-03 (×3): qty 2

## 2020-06-03 MED ORDER — DEXAMETHASONE 4 MG PO TABS
6.0000 mg | ORAL_TABLET | ORAL | Status: DC
  Administered 2020-06-03: 6 mg via ORAL
  Filled 2020-06-03: qty 1

## 2020-06-03 MED ORDER — SODIUM CHLORIDE 0.9% FLUSH
3.0000 mL | Freq: Two times a day (BID) | INTRAVENOUS | Status: DC
  Administered 2020-06-03 – 2020-06-06 (×7): 3 mL via INTRAVENOUS

## 2020-06-03 MED ORDER — METHYLPREDNISOLONE SODIUM SUCC 40 MG IJ SOLR
30.0000 mg | Freq: Two times a day (BID) | INTRAMUSCULAR | Status: DC
  Administered 2020-06-03 – 2020-06-06 (×7): 30 mg via INTRAVENOUS
  Filled 2020-06-03 (×7): qty 1

## 2020-06-03 MED ORDER — ONDANSETRON HCL 4 MG PO TABS: 4.0000 mg | ORAL_TABLET | Freq: Four times a day (QID) | ORAL | Status: DC | PRN

## 2020-06-03 MED ORDER — SERTRALINE HCL 50 MG PO TABS
25.0000 mg | ORAL_TABLET | Freq: Every day | ORAL | Status: DC
  Administered 2020-06-03: 25 mg via ORAL
  Filled 2020-06-03: qty 1

## 2020-06-03 MED ORDER — LEVOTHYROXINE SODIUM 50 MCG PO TABS
50.0000 ug | ORAL_TABLET | Freq: Every day | ORAL | Status: DC
  Administered 2020-06-03 – 2020-06-06 (×4): 50 ug via ORAL
  Filled 2020-06-03 (×4): qty 1

## 2020-06-03 NOTE — Plan of Care (Signed)

## 2020-06-03 NOTE — ED Notes (Signed)
Blood drawn and sent to lab . Pt sleeping at bedside remains on monitor x4 , will continue to monitor.

## 2020-06-03 NOTE — Progress Notes (Signed)
PROGRESS NOTE  [name] CBJ:[id number] DOB: 1922/08/31 DOA: 06/02/2020 PCP: [name], MD   LOS: 1 day   Brief Narrative / Interim history: 87 year old female with dementia, HTN, A. fib on Coumadin, PE/DVT in 2011, hypertension, hyperlipidemia, tracking, status post pacemaker came to the hospital with generalized weakness, cough for the past week.  Apparently she was too weak to transfer from bed to chair which is unusual for her.  EMS also noted her to be hypoxic requiring oxygen.  She normally uses a walker at baseline and has a history of chronic diarrhea.  She was recently started on Klonopin for myoclonic jerking.  Subjective / 24h Interval events: She is confused, alert, very pleasant.  No complaints for me  Assessment & Plan: Principal Problem Acute hypoxic respiratory failure due to pneumonia due to COVID-19 -Patient is having mild hypoxia requiring 2 L nasal cannula, chest x-ray did not show evidence of pneumonia.  She is fully vaccinated received. She was started on Remdesivir along with steroids, continue.  No need for antibiotics since her procalcitonin is low  Active Problems Paroxysmal atrial fibrillation, history of third-degree AV block -Does not appear to be on rate controlling medications, continue Coumadin -Has a pacemaker  Hyperlipidemia -Chronic, stable  Hypothyroidism -Continue Synthroid  Myoclonic jerking -Klonopin on hold.  Daughter tells me that she took half a pill for couple of days a week ago and was too fatigued and they discontinued this  Scheduled Meds: . gabapentin  300 mg Oral BID  . levothyroxine  50 mcg Oral QAC breakfast  . memantine  10 mg Oral BID  . methylPREDNISolone (SOLU-MEDROL) injection  30 mg Intravenous BID  . pantoprazole  40 mg Oral Daily  . sertraline  25 mg Oral BID  . sodium chloride flush  3 mL Intravenous Q12H  . sodium chloride flush  3 mL Intravenous Q12H  . warfarin  5 mg Oral ONCE-1600  . [START ON  06/04/2020] Warfarin - Pharmacist Dosing Inpatient   Does not apply q1600   Continuous Infusions: . sodium chloride    . remdesivir 100 mg in NS 100 mL 100 mg (06/03/20 0929)   PRN Meds:.sodium chloride, acetaminophen, guaiFENesin-dextromethorphan, ondansetron **OR** ondansetron (ZOFRAN) IV, sodium chloride flush  Diet Orders (From admission, onward)    Start     Ordered   06/03/20 0200  Diet Heart Room service appropriate? Yes; Fluid consistency: Thin  Diet effective now       Question Answer Comment  Room service appropriate? Yes   Fluid consistency: Thin      06/03/20 0200          DVT prophylaxis:  warfarin (COUMADIN) tablet 5 mg     Code Status: DNR  Family Communication: Updated daughter over the phone  Status is: Inpatient  Remains inpatient appropriate because:Inpatient level of care appropriate due to severity of illness   Dispo: The patient is from: Home              Anticipated d/c is to: Home              Anticipated d/c date is: 2 days              Patient currently is not medically stable to d/c.  Consultants:  None   Procedures:  None   Microbiology  None   Antimicrobials: None     Objective: Vitals:   06/03/20 0108 06/03/20 0233 06/03/20 0428 06/03/20 0651  BP: (!) 125/54 (!) 113/57 [name])  110/51 (!) 108/54  Pulse: 77 77 71 71  Resp: 20 18 16 18   Temp: 98.5 F (36.9 C)     TempSrc: Oral     SpO2: 95% 95% 95% 95%  Weight:      Height:        Intake/Output Summary (Last 24 hours) at 06/03/2020 1024 Last data filed at 06/02/2020 2315 Gross per 24 hour  Intake 750 ml  Output --  Net 750 ml   Filed Weights   06/02/20 1808  Weight: 60.8 kg    Examination:  Constitutional: NAD Eyes: no scleral icterus ENMT: Mucous membranes are moist.  Neck: normal, supple Respiratory: Diminished at the bases but overall clear to auscultation bilaterally, no wheezing, no crackles. Normal respiratory effort. No accessory muscle use.   Cardiovascular: Regular rate and rhythm, no murmurs / rubs / gallops. No LE edema.  Abdomen: non distended, no tenderness. Bowel sounds positive.  Musculoskeletal: no clubbing / cyanosis.  Skin: no rashes Neurologic: CN 2-12 grossly intact. Strength 5/5 in all 4.   Data Reviewed: I have independently reviewed following labs and imaging studies   CBC: Recent Labs  Lab 06/02/20 1900 06/03/20 0427  WBC 14.0* 10.6*  NEUTROABS 11.5* 8.5*  HGB 12.1 11.1*  HCT 38.7 36.0  MCV 93.7 93.8  PLT 171 222   Basic Metabolic Panel: Recent Labs  Lab 06/02/20 1900 06/03/20 0200 06/03/20 0427  NA 142 141 142  K 3.9 4.4 3.5  CL 105 105 106  CO2 26 22 26   GLUCOSE 119* 102* 113*  BUN 22 22 23   CREATININE 0.93 1.00 0.83  CALCIUM 8.7* 8.4* 8.4*  MG  --   --  2.0   Liver Function Tests: Recent Labs  Lab 06/02/20 1900 06/03/20 0200 06/03/20 0427  AST 18 29 14*  ALT 14 13 13   ALKPHOS 68 60 64  BILITOT 0.4 0.4 0.4  PROT 6.5 6.1* 5.9*  ALBUMIN 3.6 3.3* 3.1*   Coagulation Profile: Recent Labs  Lab 06/02/20 1900  INR 2.2*   HbA1C: No results for input(s): HGBA1C in the last 72 hours. CBG: No results for input(s): GLUCAP in the last 168 hours.  No results found for this or any previous visit (from the past 240 hour(s)).   Radiology Studies: DG Chest Port 1 View  Result Date: 06/02/2020 CLINICAL DATA:  Hypoxia EXAM: PORTABLE CHEST 1 VIEW COMPARISON:  11/01/2013 FINDINGS: Left-sided pacing device as before. The right lung is grossly clear. Borderline cardiomegaly with aortic atherosclerosis. Patchy opacity left base. No pneumothorax IMPRESSION: Patchy atelectasis or infiltrate at the left base. Electronically Signed   By: Donavan Foil M.D.   On: 06/02/2020 19:05   CUP PACEART REMOTE DEVICE CHECK  Result Date: 06/02/2020 Scheduled remote reviewed. Normal device function.  Atrial arrhythmias were all less than one minute Next remote 91 days. Kathy Breach, RN, CCDS, CV Remote  [employer]  Marzetta Board, MD, PhD [facility]  Between 7 am - 7 pm I am available, please contact me via Amion or Securechat  Between 7 pm - 7 am I am not available, please contact night coverage MD/APP via Amion

## 2020-06-03 NOTE — Progress Notes (Signed)
Attempt to return call to [facility], to obtain report, RN unavailable. SRP, RN

## 2020-06-03 NOTE — Plan of Care (Signed)

## 2020-06-03 NOTE — Plan of Care (Signed)
  Problem: Education: Goal: Knowledge of General Education information will improve Description: Including pain rating scale, medication(s)/side effects and non-pharmacologic comfort measures Outcome: Progressing   Problem: Education: Goal: Knowledge of risk factors and measures for prevention of condition will improve Outcome: Progressing   Problem: Coping: Goal: Psychosocial and spiritual needs will be supported Outcome: Progressing

## 2020-06-03 NOTE — Telephone Encounter (Signed)
Husband called to update the Anticoagulation Clinic that the pt is in the Hospital with Covid. Advised to update [place] as needed and will note she is in the hospital at this time.

## 2020-06-03 NOTE — ED Notes (Signed)
Attempted to give report to charge RN on receiving floor, but RN is currently unavailable.

## 2020-06-03 NOTE — ED Notes (Signed)
Attempted report but receiving RN is in a meeting and will call back.

## 2020-06-03 NOTE — ED Notes (Signed)
Initial contact with pt. Report received from [place], Therapist, sports. Pt is on monitor x4 along with 2 L of O2. Pt is hard of hearing, no signs of distress. Will continue to monitor.

## 2020-06-03 NOTE — Telephone Encounter (Signed)
Confirmed Dr. Lyndel Safe is aware. Patient is in hospital and has [place].

## 2020-06-03 NOTE — Progress Notes (Signed)
ANTICOAGULATION CONSULT NOTE - Initial Consult  Pharmacy Consult for Coumadin Indication: atrial fibrillation  Allergies  Allergen Reactions  . Alendronate Other (See Comments)    Can not tolerate  . Celebrex [Celecoxib] Other (See Comments)    Can not tolerate  . Meloxicam Other (See Comments)    Avoid   . Myrbetriq [Mirabegron] Other (See Comments)    Can not tolerate  . Norco [Hydrocodone-Acetaminophen] Other (See Comments)    Dizziness and jerking  . Pradaxa [Dabigatran Etexilate Mesylate] Other (See Comments)    Can not tolerate  . Pradaxa [Dabigatran]   . Toviaz [Fesoterodine Fumarate Er] Other (See Comments)    Can not tolerate  . Vesicare [Solifenacin] Other (See Comments)    Can not tolerate    Patient Measurements: Height: 5' (152.4 cm) Weight: 60.8 kg (134 lb) IBW/kg (Calculated) : 45.5  Vital Signs: Temp: 99.3 F (37.4 C) (01/20 2236) Temp Source: Oral (01/20 2236) BP: 115/56 (01/20 2330) Pulse Rate: 76 (01/20 2330)  Labs: Recent Labs    06/02/20 1900  HGB 12.1  HCT 38.7  PLT 171  LABPROT 23.5*  INR 2.2*  CREATININE 0.93    Estimated Creatinine Clearance: 29.5 mL/min (by C-G formula based on SCr of 0.93 mg/dL).   Medical History: Past Medical History:  Diagnosis Date  . Arthritis   . Atrial fibrillation ([place])   . Cataract    removed bilateral  . Chronic diastolic CHF (congestive heart failure) ([place]) 06/02/2020  . Clotting disorder ([place])    h/o blood clot left leg  . Dementia ([place])   . Depression   . DVT (deep venous thrombosis) ([place]) 2011  . GERD (gastroesophageal reflux disease)   . HB (heart block)    s/p PPM, most recent generator change 12/11 by JA  . Heart murmur   . Hiatal hernia   . Hyperlipidemia   . Hypertension   . Hypothyroidism   . Osteoporosis   . PTE (post-transplant erythrocytosis)   . Pulmonary embolism ([place]) 2011   on coumadin  . UTI (lower urinary tract infection)     Medications:  Coumadin 5mg  daily po  PTA - LD 1/20  Assessment: 87 yo F admitted with COVID PNA on warfarin PTA for Afib.  INR 2.2- therapeutic on admission.  No bleeding noted. CBC, Scr WNL.   Goal of Therapy:  INR 2-3   Plan:  Coumadin 5mg  po x1 Daily INR Monitor for s/sx of bleeding  Netta Cedars PharmD 06/03/2020,12:40 AM

## 2020-06-04 DIAGNOSIS — R4189 Other symptoms and signs involving cognitive functions and awareness: Secondary | ICD-10-CM | POA: Diagnosis not present

## 2020-06-04 DIAGNOSIS — I4891 Unspecified atrial fibrillation: Secondary | ICD-10-CM | POA: Diagnosis not present

## 2020-06-04 DIAGNOSIS — U071 COVID-19: Secondary | ICD-10-CM | POA: Diagnosis not present

## 2020-06-04 DIAGNOSIS — J9601 Acute respiratory failure with hypoxia: Secondary | ICD-10-CM | POA: Diagnosis not present

## 2020-06-04 LAB — CBC WITH DIFFERENTIAL/PLATELET
Abs Immature Granulocytes: 0.14 10*3/uL — ABNORMAL HIGH (ref 0.00–0.07)
Basophils Absolute: 0 10*3/uL (ref 0.0–0.1)
Basophils Relative: 0 %
Eosinophils Absolute: 0 10*3/uL (ref 0.0–0.5)
Eosinophils Relative: 0 %
HCT: 39.2 % (ref 36.0–46.0)
Hemoglobin: 12.1 g/dL (ref 12.0–15.0)
Immature Granulocytes: 1 %
Lymphocytes Relative: 13 %
Lymphs Abs: 1.3 10*3/uL (ref 0.7–4.0)
MCH: 28.9 pg (ref 26.0–34.0)
MCHC: 30.9 g/dL (ref 30.0–36.0)
MCV: 93.6 fL (ref 80.0–100.0)
Monocytes Absolute: 0.5 10*3/uL (ref 0.1–1.0)
Monocytes Relative: 4 %
Neutro Abs: 8.3 10*3/uL — ABNORMAL HIGH (ref 1.7–7.7)
Neutrophils Relative %: 82 %
Platelets: 171 10*3/uL (ref 150–400)
RBC: 4.19 MIL/uL (ref 3.87–5.11)
RDW: 14.1 % (ref 11.5–15.5)
WBC: 10.3 10*3/uL (ref 4.0–10.5)
nRBC: 0 % (ref 0.0–0.2)

## 2020-06-04 LAB — FERRITIN: Ferritin: 41 ng/mL (ref 11–307)

## 2020-06-04 LAB — D-DIMER, QUANTITATIVE: D-Dimer, Quant: 0.38 ug/mL-FEU (ref 0.00–0.50)

## 2020-06-04 LAB — COMPREHENSIVE METABOLIC PANEL
ALT: 16 U/L (ref 0–44)
AST: 17 U/L (ref 15–41)
Albumin: 3.3 g/dL — ABNORMAL LOW (ref 3.5–5.0)
Alkaline Phosphatase: 62 U/L (ref 38–126)
Anion gap: 8 (ref 5–15)
BUN: 28 mg/dL — ABNORMAL HIGH (ref 8–23)
CO2: 25 mmol/L (ref 22–32)
Calcium: 8.9 mg/dL (ref 8.9–10.3)
Chloride: 107 mmol/L (ref 98–111)
Creatinine, Ser: 0.67 mg/dL (ref 0.44–1.00)
GFR, Estimated: >60 mL/min (ref >60)
Glucose, Bld: 149 mg/dL — ABNORMAL HIGH (ref 70–99)
Potassium: 3.6 mmol/L (ref 3.5–5.1)
Sodium: 140 mmol/L (ref 135–145)
Total Bilirubin: 0.6 mg/dL (ref 0.3–1.2)
Total Protein: 6.1 g/dL — ABNORMAL LOW (ref 6.5–8.1)

## 2020-06-04 LAB — PROTIME-INR
INR: 2.1 — ABNORMAL HIGH (ref 0.8–1.2)
Prothrombin Time: 23 seconds — ABNORMAL HIGH (ref 11.4–15.2)

## 2020-06-04 LAB — MAGNESIUM: Magnesium: 2 mg/dL (ref 1.7–2.4)

## 2020-06-04 LAB — C-REACTIVE PROTEIN: CRP: 8.4 mg/dL — ABNORMAL HIGH (ref <1.0)

## 2020-06-04 MED ORDER — WARFARIN SODIUM 5 MG PO TABS
5.0000 mg | ORAL_TABLET | Freq: Once | ORAL | Status: AC
  Administered 2020-06-04: 5 mg via ORAL
  Filled 2020-06-04: qty 1

## 2020-06-04 NOTE — Progress Notes (Signed)
Pt O2 removed about 20 minutes ago, pt currently 95% on RA and eating lunch.

## 2020-06-04 NOTE — Progress Notes (Signed)
[place] for Coumadin Indication: atrial fibrillation  Allergies  Allergen Reactions  . Alendronate Other (See Comments)    Can not tolerate  . Celebrex [Celecoxib] Other (See Comments)    Can not tolerate  . Meloxicam Other (See Comments)    Avoid   . Myrbetriq [Mirabegron] Other (See Comments)    Can not tolerate  . Norco [Hydrocodone-Acetaminophen] Other (See Comments)    Dizziness and jerking  . Pradaxa [Dabigatran Etexilate Mesylate] Other (See Comments)    Can not tolerate  . Pradaxa [Dabigatran]   . Toviaz [Fesoterodine Fumarate Er] Other (See Comments)    Can not tolerate  . Vesicare [Solifenacin] Other (See Comments)    Can not tolerate    Patient Measurements: Height: 5' (152.4 cm) Weight: 58.8 kg (129 lb 10.1 oz) IBW/kg (Calculated) : 45.5  Vital Signs: Temp: 98.4 F (36.9 C) (01/22 0603) Temp Source: Oral (01/22 0603) BP: 154/68 (01/22 0603) Pulse Rate: 70 (01/22 0603)  Labs: Recent Labs    06/02/20 1900 06/03/20 0121 06/03/20 0200 06/03/20 0427 06/04/20 0336  HGB 12.1  --   --  11.1* 12.1  HCT 38.7  --   --  36.0 39.2  PLT 171  --   --  153 171  LABPROT 23.5*  --   --   --  23.0*  INR 2.2*  --   --   --  2.1*  CREATININE 0.93  --  1.00 0.83 0.67  TROPONINIHS  --  20*  20*  --   --   --    Estimated Creatinine Clearance: 33.7 mL/min (by C-G formula based on SCr of 0.67 mg/dL).  Medical History: Past Medical History:  Diagnosis Date  . Arthritis   . Atrial fibrillation ([place])   . Cataract    removed bilateral  . Chronic diastolic CHF (congestive heart failure) ([place]) 06/02/2020  . Clotting disorder ([place])    h/o blood clot left leg  . Dementia ([place])   . Depression   . DVT (deep venous thrombosis) ([place]) 2011  . GERD (gastroesophageal reflux disease)   . HB (heart block)    s/p PPM, most recent generator change 12/11 by JA  . Heart murmur   . Hiatal hernia   . Hyperlipidemia   . Hypertension   .  Hypothyroidism   . Osteoporosis   . PTE (post-transplant erythrocytosis)   . Pulmonary embolism ([place]) 2011   on coumadin  . UTI (lower urinary tract infection)     Medications:  Coumadin 5mg  daily po PTA - LD 1/20  Assessment: 87 yo F admitted with COVID PNA on warfarin PTA for Afib.  INR 2.2- therapeutic on admission.  No bleeding noted. CBC, Scr WNL.   Goal of Therapy:  INR 2-3   Plan:  Coumadin 5mg  po x1 today at 1600 Daily INR Monitor for s/sx of bleeding  Minda Ditto PharmD 06/04/2020,1:54 PM

## 2020-06-04 NOTE — Progress Notes (Signed)
PROGRESS NOTE  [name] WGN:[id number] DOB: December 16, 1922 DOA: 06/02/2020 PCP: [name], MD   LOS: 2 days   Brief Narrative / Interim history: 87 year old female with dementia, HTN, A. fib on Coumadin, PE/DVT in 2011, hypertension, hyperlipidemia, tracking, status post pacemaker came to the hospital with generalized weakness, cough for the past week.  Apparently she was too weak to transfer from bed to chair which is unusual for her.  EMS also noted her to be hypoxic requiring oxygen.  She normally uses a walker at baseline and has a history of chronic diarrhea.  She was recently started on Klonopin for myoclonic jerking.  Subjective / 24h Interval events: Seems more oriented this morning, has no complaints  Assessment & Plan: Principal Problem Acute hypoxic respiratory failure due to pneumonia due to COVID-19 -Patient is having mild hypoxia requiring 2 L nasal cannula, chest x-ray did not show evidence of pneumonia.  She is fully vaccinated received. She was started on Remdesivir along with steroids, continue.  No need for antibiotics since her procalcitonin is low -Remains on 2 L this morning, wean off to room air as tolerated.  PT to see  Active Problems Paroxysmal atrial fibrillation, history of third-degree AV block -Does not appear to be on rate controlling medications, continue Coumadin -Has a pacemaker  Hyperlipidemia -Chronic, stable  Hypothyroidism -Continue Synthroid  Myoclonic jerking -Klonopin on hold.  Daughter tells me that she took half a pill for couple of days a week ago and was too fatigued and they discontinued this  Scheduled Meds: . gabapentin  300 mg Oral BID  . levothyroxine  50 mcg Oral QAC breakfast  . memantine  10 mg Oral BID  . methylPREDNISolone (SOLU-MEDROL) injection  30 mg Intravenous BID  . pantoprazole  40 mg Oral Daily  . sertraline  25 mg Oral BID  . sodium chloride flush  3 mL Intravenous Q12H  . sodium chloride flush  3 mL  Intravenous Q12H  . Warfarin - Pharmacist Dosing Inpatient   Does not apply q1600   Continuous Infusions: . sodium chloride    . remdesivir 100 mg in NS 100 mL 100 mg (06/04/20 1136)   PRN Meds:.sodium chloride, acetaminophen, guaiFENesin-dextromethorphan, ondansetron **OR** ondansetron (ZOFRAN) IV, sodium chloride flush  Diet Orders (From admission, onward)    Start     Ordered   06/03/20 0200  Diet Heart Room service appropriate? Yes; Fluid consistency: Thin  Diet effective now       Question Answer Comment  Room service appropriate? Yes   Fluid consistency: Thin      06/03/20 0200          DVT prophylaxis:      Code Status: DNR  Family Communication: Updated daughter over the phone  Status is: Inpatient  Remains inpatient appropriate because:Inpatient level of care appropriate due to severity of illness   Dispo: The patient is from: Home              Anticipated d/c is to: Home              Anticipated d/c date is: 2 days              Patient currently is not medically stable to d/c.  Consultants:  None   Procedures:  None   Microbiology  None   Antimicrobials: None     Objective: Vitals:   06/03/20 1335 06/03/20 1336 06/03/20 2225 06/04/20 0603  BP:  (!) 128/58 126/65 (!) 154/68  Pulse:  76  70  Resp:    18  Temp:  98.6 F (37 C) 98 F (36.7 C) 98.4 F (36.9 C)  TempSrc:  Oral Oral Oral  SpO2:  95% 96% 99%  Weight: 58.8 kg     Height: 5' (1.524 m)       Intake/Output Summary (Last 24 hours) at 06/04/2020 1313 Last data filed at 06/04/2020 0600 Gross per 24 hour  Intake 100 ml  Output 400 ml  Net -300 ml   Filed Weights   06/02/20 1808 06/03/20 1335  Weight: 60.8 kg 58.8 kg    Examination:  Constitutional: NAD Eyes: No icterus ENMT: mmm Neck: normal, supple Respiratory: No wheezing, no crackles, respiratory effort Cardiovascular: Regular, no murmurs, no edema Abdomen: Nontender, nondistended, bowel sounds  positive Musculoskeletal: no clubbing / cyanosis.  Skin: No rashes seen Neurologic: Nonfocal  Data Reviewed: I have independently reviewed following labs and imaging studies   CBC: Recent Labs  Lab 06/02/20 1900 06/03/20 0427 06/04/20 0336  WBC 14.0* 10.6* 10.3  NEUTROABS 11.5* 8.5* 8.3*  HGB 12.1 11.1* 12.1  HCT 38.7 36.0 39.2  MCV 93.7 93.8 93.6  PLT 171 153 503   Basic Metabolic Panel: Recent Labs  Lab 06/02/20 1900 06/03/20 0200 06/03/20 0427 06/04/20 0336  NA 142 141 142 140  K 3.9 4.4 3.5 3.6  CL 105 105 106 107  CO2 26 22 26 25   GLUCOSE 119* 102* 113* 149*  BUN 22 22 23  28*  CREATININE 0.93 1.00 0.83 0.67  CALCIUM 8.7* 8.4* 8.4* 8.9  MG  --   --  2.0 2.0   Liver Function Tests: Recent Labs  Lab 06/02/20 1900 06/03/20 0200 06/03/20 0427 06/04/20 0336  AST 18 29 14* 17  ALT 14 13 13 16   ALKPHOS 68 60 64 62  BILITOT 0.4 0.4 0.4 0.6  PROT 6.5 6.1* 5.9* 6.1*  ALBUMIN 3.6 3.3* 3.1* 3.3*   Coagulation Profile: Recent Labs  Lab 06/02/20 1900 06/04/20 0336  INR 2.2* 2.1*   HbA1C: No results for input(s): HGBA1C in the last 72 hours. CBG: No results for input(s): GLUCAP in the last 168 hours.  Recent Results (from the past 240 hour(s))  Urine culture     Status: Abnormal (Preliminary result)   Collection Time: 06/02/20  9:20 PM   Specimen: Urine, Clean Catch  Result Value Ref Range Status   Specimen Description   Final    URINE, CLEAN CATCH Performed at [facility], [place] [address], [place], [place] [address]    Special Requests   Final    NONE Performed at [facility], [place] [address], [place], [place] [address]    Culture (A)  Final    >=100,000 COLONIES/mL KLEBSIELLA PNEUMONIAE SUSCEPTIBILITIES TO FOLLOW Performed at [facility], [place] [address] [place], [place] [address]    Report Status PENDING  Incomplete  Culture, blood (Routine X 2) w Reflex to ID Panel     Status: None (Preliminary  result)   Collection Time: 06/03/20  4:27 AM   Specimen: BLOOD  Result Value Ref Range Status   Specimen Description   Final    BLOOD SITE NOT SPECIFIED Performed at [facility] [address], [place], [place] [address]    Special Requests   Final    BOTTLES DRAWN AEROBIC ONLY Blood Culture adequate volume Performed at [place] [address], [place],  [address]  Culture PENDING  Incomplete   Report Status PENDING  Incomplete     Radiology Studies: No results found. Marzetta Board, MD, PhD [facility]  Between 7 am - 7 pm I am available, please contact me via Amion or Securechat  Between 7 pm - 7 am I am not available, please contact night coverage MD/APP via Amion

## 2020-06-05 DIAGNOSIS — R4189 Other symptoms and signs involving cognitive functions and awareness: Secondary | ICD-10-CM | POA: Diagnosis not present

## 2020-06-05 DIAGNOSIS — I4891 Unspecified atrial fibrillation: Secondary | ICD-10-CM | POA: Diagnosis not present

## 2020-06-05 DIAGNOSIS — J9601 Acute respiratory failure with hypoxia: Secondary | ICD-10-CM | POA: Diagnosis not present

## 2020-06-05 DIAGNOSIS — U071 COVID-19: Secondary | ICD-10-CM | POA: Diagnosis not present

## 2020-06-05 LAB — COMPREHENSIVE METABOLIC PANEL
ALT: 16 U/L (ref 0–44)
AST: 16 U/L (ref 15–41)
Albumin: 3.1 g/dL — ABNORMAL LOW (ref 3.5–5.0)
Alkaline Phosphatase: 63 U/L (ref 38–126)
Anion gap: 5 (ref 5–15)
BUN: 26 mg/dL — ABNORMAL HIGH (ref 8–23)
CO2: 27 mmol/L (ref 22–32)
Calcium: 9.1 mg/dL (ref 8.9–10.3)
Chloride: 106 mmol/L (ref 98–111)
Creatinine, Ser: 0.8 mg/dL (ref 0.44–1.00)
GFR, Estimated: >60 mL/min (ref >60)
Glucose, Bld: 145 mg/dL — ABNORMAL HIGH (ref 70–99)
Potassium: 4.2 mmol/L (ref 3.5–5.1)
Sodium: 138 mmol/L (ref 135–145)
Total Bilirubin: 0.4 mg/dL (ref 0.3–1.2)
Total Protein: 5.8 g/dL — ABNORMAL LOW (ref 6.5–8.1)

## 2020-06-05 LAB — CBC WITH DIFFERENTIAL/PLATELET
Abs Immature Granulocytes: 0.07 10*3/uL (ref 0.00–0.07)
Basophils Absolute: 0 10*3/uL (ref 0.0–0.1)
Basophils Relative: 0 %
Eosinophils Absolute: 0 10*3/uL (ref 0.0–0.5)
Eosinophils Relative: 0 %
HCT: 38.5 % (ref 36.0–46.0)
Hemoglobin: 12 g/dL (ref 12.0–15.0)
Immature Granulocytes: 1 %
Lymphocytes Relative: 10 %
Lymphs Abs: 1.1 10*3/uL (ref 0.7–4.0)
MCH: 29.3 pg (ref 26.0–34.0)
MCHC: 31.2 g/dL (ref 30.0–36.0)
MCV: 94.1 fL (ref 80.0–100.0)
Monocytes Absolute: 0.2 10*3/uL (ref 0.1–1.0)
Monocytes Relative: 2 %
Neutro Abs: 9.3 10*3/uL — ABNORMAL HIGH (ref 1.7–7.7)
Neutrophils Relative %: 87 %
Platelets: 169 10*3/uL (ref 150–400)
RBC: 4.09 MIL/uL (ref 3.87–5.11)
RDW: 14.1 % (ref 11.5–15.5)
WBC: 10.6 10*3/uL — ABNORMAL HIGH (ref 4.0–10.5)
nRBC: 0.2 % (ref 0.0–0.2)

## 2020-06-05 LAB — PROTIME-INR
INR: 2.1 — ABNORMAL HIGH (ref 0.8–1.2)
Prothrombin Time: 22.9 seconds — ABNORMAL HIGH (ref 11.4–15.2)

## 2020-06-05 LAB — C-REACTIVE PROTEIN: CRP: 3.2 mg/dL — ABNORMAL HIGH (ref <1.0)

## 2020-06-05 LAB — URINE CULTURE: Culture: >=100000 — AB

## 2020-06-05 LAB — FERRITIN: Ferritin: 39 ng/mL (ref 11–307)

## 2020-06-05 LAB — MAGNESIUM: Magnesium: 2.1 mg/dL (ref 1.7–2.4)

## 2020-06-05 LAB — D-DIMER, QUANTITATIVE: D-Dimer, Quant: 0.31 ug/mL-FEU (ref 0.00–0.50)

## 2020-06-05 MED ORDER — WARFARIN SODIUM 5 MG PO TABS
5.0000 mg | ORAL_TABLET | Freq: Once | ORAL | Status: AC
  Administered 2020-06-05: 5 mg via ORAL
  Filled 2020-06-05: qty 1

## 2020-06-05 MED ORDER — DEXAMETHASONE 6 MG PO TABS: 6.0000 mg | ORAL_TABLET | Freq: Every day | ORAL | 0 refills | Status: AC

## 2020-06-05 NOTE — Progress Notes (Signed)
[place] [facility] for Coumadin Indication: atrial fibrillation  Allergies  Allergen Reactions  . Alendronate Other (See Comments)    Can not tolerate  . Celebrex [Celecoxib] Other (See Comments)    Can not tolerate  . Meloxicam Other (See Comments)    Avoid   . Myrbetriq [Mirabegron] Other (See Comments)    Can not tolerate  . Norco [Hydrocodone-Acetaminophen] Other (See Comments)    Dizziness and jerking  . Pradaxa [Dabigatran Etexilate Mesylate] Other (See Comments)    Can not tolerate  . Pradaxa [Dabigatran]   . Toviaz [Fesoterodine Fumarate Er] Other (See Comments)    Can not tolerate  . Vesicare [Solifenacin] Other (See Comments)    Can not tolerate    Patient Measurements: Height: 5' (152.4 cm) Weight: 58.8 kg (129 lb 10.1 oz) IBW/kg (Calculated) : 45.5  Vital Signs: Temp: 97.8 F (36.6 C) (01/23 0638) Temp Source: Oral (01/23 0175) BP: 163/70 (01/23 1025) Pulse Rate: 64 (01/23 0638)  Labs: Recent Labs    06/02/20 1900 06/03/20 0121 06/03/20 0200 06/03/20 0427 06/04/20 0336 06/05/20 0426  HGB 12.1  --   --  11.1* 12.1 12.0  HCT 38.7  --   --  36.0 39.2 38.5  PLT 171  --   --  153 171 169  LABPROT 23.5*  --   --   --  23.0* 22.9*  INR 2.2*  --   --   --  2.1* 2.1*  CREATININE 0.93  --    < > 0.83 0.67 0.80  TROPONINIHS  --  20*  20*  --   --   --   --    < > = values in this interval not displayed.   Estimated Creatinine Clearance: 33.7 mL/min (by C-G formula based on SCr of 0.8 mg/dL).  Medical History: Past Medical History:  Diagnosis Date  . Arthritis   . Atrial fibrillation ([place])   . Cataract    removed bilateral  . Chronic diastolic CHF (congestive heart failure) ([place]) 06/02/2020  . Clotting disorder ([place])    h/o blood clot left leg  . Dementia ([place])   . Depression   . DVT (deep venous thrombosis) ([place]) 2011  . GERD (gastroesophageal reflux disease)   . HB (heart block)    s/p PPM, most recent generator  change 12/11 by JA  . Heart murmur   . Hiatal hernia   . Hyperlipidemia   . Hypertension   . Hypothyroidism   . Osteoporosis   . PTE (post-transplant erythrocytosis)   . Pulmonary embolism ([place]) 2011   on coumadin  . UTI (lower urinary tract infection)     Medications:  Coumadin 5mg  daily po PTA - LD 1/20  Assessment: 87 yo F admitted with COVID PNA on warfarin PTA for Afib.  INR 2.2- therapeutic on admission.  No bleeding noted. CBC, Scr WNL.   INR remains in therapeutic range on home dose  Goal of Therapy:  INR 2-3   Plan:  Coumadin 5mg  po x1 today at 1600 Daily INR Monitor for s/sx of bleeding  Minda Ditto PharmD 06/05/2020,8:46 AM

## 2020-06-05 NOTE — Progress Notes (Signed)
PROGRESS NOTE  [name] [id number] DOB: 06/13/1922 DOA: 06/02/2020 PCP: [name], MD   LOS: 3 days   Brief Narrative / Interim history: 87 year old female with dementia, HTN, A. fib on Coumadin, PE/DVT in 2011, hypertension, hyperlipidemia, tracking, status post pacemaker came to the hospital with generalized weakness, cough for the past week.  Apparently she was too weak to transfer from bed to chair which is unusual for her.  EMS also noted her to be hypoxic requiring oxygen.  She normally uses a walker at baseline and has a history of chronic diarrhea.  She was recently started on Klonopin for myoclonic jerking.  Subjective / 24h Interval events: Wants to go home. She feels well   Assessment & Plan: Principal Problem Acute hypoxic respiratory failure due to pneumonia due to COVID-19 -Patient is having mild hypoxia requiring 2 L nasal cannula, chest x-ray did not show evidence of pneumonia.  She is fully vaccinated received. She was started on Remdesivir along with steroids, continue. Remdesivir to be done on Monday. -No need for antibiotics since her procalcitonin is low -weaned to room air -remains weak, PT worked with her, would have another session with her tomorrow.  Active Problems Paroxysmal atrial fibrillation, history of third-degree AV block -Does not appear to be on rate controlling medications, continue Coumadin -Has a pacemaker  Hyperlipidemia -Chronic, stable  Hypothyroidism -Continue Synthroid  Myoclonic jerking -Klonopin on hold.  Daughter tells me that she took half a pill for couple of days a week ago and was too fatigued and they discontinued this  Scheduled Meds: . gabapentin  300 mg Oral BID  . levothyroxine  50 mcg Oral QAC breakfast  . memantine  10 mg Oral BID  . methylPREDNISolone (SOLU-MEDROL) injection  30 mg Intravenous BID  . pantoprazole  40 mg Oral Daily  . sertraline  25 mg Oral BID  . sodium chloride flush  3 mL  Intravenous Q12H  . sodium chloride flush  3 mL Intravenous Q12H  . warfarin  5 mg Oral ONCE-1600  . Warfarin - Pharmacist Dosing Inpatient   Does not apply q1600   Continuous Infusions: . sodium chloride    . remdesivir 100 mg in NS 100 mL 100 mg (06/05/20 1031)   PRN Meds:.sodium chloride, acetaminophen, guaiFENesin-dextromethorphan, ondansetron **OR** ondansetron (ZOFRAN) IV, sodium chloride flush  Diet Orders (From admission, onward)    Start     Ordered   06/03/20 0200  Diet Heart Room service appropriate? Yes; Fluid consistency: Thin  Diet effective now       Question Answer Comment  Room service appropriate? Yes   Fluid consistency: Thin      06/03/20 0200          DVT prophylaxis:      Code Status: DNR  Family Communication: Updated daughter over the phone  Status is: Inpatient  Remains inpatient appropriate because:Inpatient level of care appropriate due to severity of illness   Dispo: The patient is from: Home              Anticipated d/c is to: Home              Anticipated d/c date is: 2 days              Patient currently is not medically stable to d/c.  Consultants:  None   Procedures:  None   Microbiology  None   Antimicrobials: None     Objective: Vitals:   06/04/20 1500 06/04/20  1600 06/04/20 2138 06/05/20 0638  BP:   (!) 150/75 (!) 163/70  Pulse:   66 64  Resp: 14 15 16 18   Temp:   98.3 F (36.8 C) 97.8 F (36.6 C)  TempSrc:   Oral Oral  SpO2:  95% 94% 97%  Weight:      Height:        Intake/Output Summary (Last 24 hours) at 06/05/2020 1410 Last data filed at 06/05/2020 1610 Gross per 24 hour  Intake --  Output 1200 ml  Net -1200 ml   Filed Weights   06/02/20 1808 06/03/20 1335  Weight: 60.8 kg 58.8 kg    Examination:  Constitutional: NAD Eyes: no icterus  ENMT: mmm Neck: normal, supple Respiratory: no wheezing, no rhonchi, moves air well  Cardiovascular: rrr, no mrg, no edema  Abdomen: soft, nt, nd,  bs+ Musculoskeletal: no clubbing / cyanosis.  Skin: no rashes Neurologic: no focal deficits   Data Reviewed: I have independently reviewed following labs and imaging studies   CBC: Recent Labs  Lab 06/02/20 1900 06/03/20 0427 06/04/20 0336 06/05/20 0426  WBC 14.0* 10.6* 10.3 10.6*  NEUTROABS 11.5* 8.5* 8.3* 9.3*  HGB 12.1 11.1* 12.1 12.0  HCT 38.7 36.0 39.2 38.5  MCV 93.7 93.8 93.6 94.1  PLT 171 153 171 960   Basic Metabolic Panel: Recent Labs  Lab 06/02/20 1900 06/03/20 0200 06/03/20 0427 06/04/20 0336 06/05/20 0426  NA 142 141 142 140 138  K 3.9 4.4 3.5 3.6 4.2  CL 105 105 106 107 106  CO2 26 22 26 25 27   GLUCOSE 119* 102* 113* 149* 145*  BUN 22 22 23  28* 26*  CREATININE 0.93 1.00 0.83 0.67 0.80  CALCIUM 8.7* 8.4* 8.4* 8.9 9.1  MG  --   --  2.0 2.0 2.1   Liver Function Tests: Recent Labs  Lab 06/02/20 1900 06/03/20 0200 06/03/20 0427 06/04/20 0336 06/05/20 0426  AST 18 29 14* 17 16  ALT 14 13 13 16 16   ALKPHOS 68 60 64 62 63  BILITOT 0.4 0.4 0.4 0.6 0.4  PROT 6.5 6.1* 5.9* 6.1* 5.8*  ALBUMIN 3.6 3.3* 3.1* 3.3* 3.1*   Coagulation Profile: Recent Labs  Lab 06/02/20 1900 06/04/20 0336 06/05/20 0426  INR 2.2* 2.1* 2.1*   HbA1C: No results for input(s): HGBA1C in the last 72 hours. CBG: No results for input(s): GLUCAP in the last 168 hours.  Recent Results (from the past 240 hour(s))  Urine culture     Status: Abnormal   Collection Time: 06/02/20  9:20 PM   Specimen: Urine, Clean Catch  Result Value Ref Range Status   Specimen Description   Final    URINE, CLEAN CATCH Performed at [facility], [place] [address], [place], [place] [address]    Special Requests   Final    NONE Performed at [facility], [place] [address] [place], [place] [address]    Culture >=100,000 COLONIES/mL KLEBSIELLA PNEUMONIAE (A)  Final   Report Status 06/05/2020 FINAL  Final   Organism ID, Bacteria KLEBSIELLA PNEUMONIAE (A)  Final       Susceptibility   Klebsiella pneumoniae - MIC*    AMPICILLIN RESISTANT Resistant     CEFAZOLIN <=4 SENSITIVE Sensitive     CEFEPIME <=0.12 SENSITIVE Sensitive     CEFTRIAXONE <=0.25 SENSITIVE Sensitive     CIPROFLOXACIN <=0.25 SENSITIVE Sensitive     GENTAMICIN <=1 SENSITIVE Sensitive     IMIPENEM 0.5 SENSITIVE Sensitive  NITROFURANTOIN 64 INTERMEDIATE Intermediate     TRIMETH/SULFA <=20 SENSITIVE Sensitive     AMPICILLIN/SULBACTAM <=2 SENSITIVE Sensitive     PIP/TAZO <=4 SENSITIVE Sensitive     * >=100,000 COLONIES/mL KLEBSIELLA PNEUMONIAE  Culture, blood (Routine X 2) w Reflex to ID Panel     Status: None (Preliminary result)   Collection Time: 06/03/20  4:27 AM   Specimen: BLOOD  Result Value Ref Range Status   Specimen Description   Final    BLOOD SITE NOT SPECIFIED Performed at [facility] [address], [place], [place] [address]    Special Requests   Final    BOTTLES DRAWN AEROBIC ONLY Blood Culture adequate volume Performed at [facility] [address], [place], [place] [address]    Culture   Final    NO GROWTH 2 DAYS Performed at [facility] [address], [place], [place] [address]    Report Status PENDING  Incomplete     Radiology Studies: No results found. Marzetta Board, MD, PhD [facility]  Between 7 am - 7 pm I am available, please contact me via Amion or Securechat  Between 7 pm - 7 am I am not available, please contact night coverage MD/APP via Amion

## 2020-06-05 NOTE — Evaluation (Signed)
Physical Therapy Evaluation Patient Details Name: [name] MRN: [id number] DOB: May 10, 1923 Today's Date: 06/05/2020   History of Present Illness  Pt admitted with weakness/confusion and positive COVID test.  Pt with hx of PE, DVT, dementia, CHF, a-fib, L TKR, pacemaker, chronic diarrhea, and myoclonic jerking  Clinical Impression  Pt admitted as above and presenting with functional mobility limitations 2* marked instability with initial attempts to ambulate and with assist required to prevent falling.  Pt with improving stability with increased distance ambulated but with continued episodic instability requiring assist for balance.  Pt states this is not her baseline and that she is generally much steadier and moving more quickly.  Pt should progress to dc to prior living arrangement at [facility]    Follow Up Recommendations No PT follow up;Home health PT (dependent on acute stay progress)    Equipment Recommendations  None recommended by PT    Recommendations for Other Services       Precautions / Restrictions Precautions Precautions: Fall Restrictions Weight Bearing Restrictions: No      Mobility  Bed Mobility Overal bed mobility: Modified Independent             General bed mobility comments: Increased time but no physical assist to EOB sitting    Transfers Overall transfer level: Needs assistance Equipment used: Rolling walker (2 wheeled) Transfers: Sit to/from [facility] Sit to Stand: Min assist Stand pivot transfers: Min assist       General transfer comment: cues for use of UEs to self assist with min assist to bring wt up/forward and to balance in intial standing. Stand pvt bed to [facility] with RW and min assist for balance  Ambulation/Gait Ambulation/Gait assistance: Min assist Gait Distance (Feet): 80 Feet (twice) Assistive device: Rolling walker (2 wheeled) Gait Pattern/deviations: Step-through pattern;Decreased  step length - right;Decreased step length - left;Shuffle;Trunk flexed;Staggering left;Staggering right     General Gait Details: Pt with initial marked instability and requiring assist to prevent falling.  Pt with improved stability noted with increased distance ambulated but with continued episodic gait disturbance and balance loss requiring min assist to stabilize and prevent falling  Stairs            Wheelchair Mobility    Modified Rankin (Stroke Patients Only)       Balance Overall balance assessment: Needs assistance Sitting-balance support: No upper extremity supported;Feet supported Sitting balance-Leahy Scale: Good     Standing balance support: Bilateral upper extremity supported Standing balance-Leahy Scale: Poor                               Pertinent Vitals/Pain Pain Assessment: No/denies pain    Home Living Family/patient expects to be discharged to:: Other (Comment)                 Additional Comments: [facility] Living at [facility] with 5 yr old spouse who "sleeps a lot"    Prior Function Level of Independence: Independent with assistive device(s)         Comments: pt uses rollator     Hand Dominance        Extremity/Trunk Assessment   Upper Extremity Assessment Upper Extremity Assessment: Overall WFL for tasks assessed    Lower Extremity Assessment Lower Extremity Assessment: Overall WFL for tasks assessed       Communication   Communication: No difficulties;HOH  Cognition Arousal/Alertness: Awake/alert Behavior During  Therapy: WFL for tasks assessed/performed;Impulsive Overall Cognitive Status: History of cognitive impairments - at baseline                                 General Comments: Pt with hx of dementia - charming and cooperative this session but mildly impulsive      General Comments      Exercises     Assessment/Plan    PT Assessment Patient needs continued PT services  PT  Problem List Decreased balance;Decreased mobility;Decreased cognition;Decreased safety awareness       PT Treatment Interventions DME instruction;Gait training;Functional mobility training;Therapeutic activities;Balance training;Patient/family education    PT Goals (Current goals can be found in the Care Plan section)  Acute Rehab PT Goals Patient Stated Goal: Home PT Goal Formulation: With patient Time For Goal Achievement: 06/12/20 Potential to Achieve Goals: Good    Frequency Min 3X/week   Barriers to discharge Decreased caregiver support Lives in [facility] living with 59 yr old husband who "sleeps a lot"    Co-evaluation               AM-PAC PT "6 Clicks" Mobility  Outcome Measure Help needed turning from your back to your side while in a flat bed without using bedrails?: None Help needed moving from lying on your back to sitting on the side of a flat bed without using bedrails?: None Help needed moving to and from a bed to a chair (including a wheelchair)?: A Little Help needed standing up from a chair using your arms (e.g., wheelchair or bedside chair)?: A Little Help needed to walk in hospital room?: A Little Help needed climbing 3-5 steps with a railing? : A Little 6 Click Score: 20    End of Session Equipment Utilized During Treatment: Gait belt Activity Tolerance: Patient tolerated treatment well Patient left: in bed;with call bell/phone within reach;with bed alarm set Nurse Communication: Mobility status PT Visit Diagnosis: Unsteadiness on feet (R26.81);Difficulty in walking, not elsewhere classified (R26.2)    Time: 2811-8867 PT Time Calculation (min) (ACUTE ONLY): 48 min   Charges:   PT Evaluation $PT Eval Low Complexity: 1 Low PT Treatments $Gait Training: 8-22 mins $Therapeutic Activity: 8-22 mins        Debe Coder PT Acute Rehabilitation Services Pager [phone] Office [phone]   [facility] 06/05/2020, 5:39 PM

## 2020-06-06 DIAGNOSIS — I4891 Unspecified atrial fibrillation: Secondary | ICD-10-CM | POA: Diagnosis not present

## 2020-06-06 DIAGNOSIS — U071 COVID-19: Secondary | ICD-10-CM | POA: Diagnosis not present

## 2020-06-06 DIAGNOSIS — J9600 Acute respiratory failure, unspecified whether with hypoxia or hypercapnia: Secondary | ICD-10-CM | POA: Diagnosis not present

## 2020-06-06 DIAGNOSIS — J9601 Acute respiratory failure with hypoxia: Secondary | ICD-10-CM | POA: Diagnosis not present

## 2020-06-06 LAB — COMPREHENSIVE METABOLIC PANEL WITH GFR
ALT: 20 U/L (ref 0–44)
AST: 15 U/L (ref 15–41)
Albumin: 3 g/dL — ABNORMAL LOW (ref 3.5–5.0)
Alkaline Phosphatase: 56 U/L (ref 38–126)
Anion gap: 8 (ref 5–15)
BUN: 30 mg/dL — ABNORMAL HIGH (ref 8–23)
CO2: 24 mmol/L (ref 22–32)
Calcium: 9.1 mg/dL (ref 8.9–10.3)
Chloride: 108 mmol/L (ref 98–111)
Creatinine, Ser: 0.76 mg/dL (ref 0.44–1.00)
GFR, Estimated: >60 mL/min
Glucose, Bld: 153 mg/dL — ABNORMAL HIGH (ref 70–99)
Potassium: 4.1 mmol/L (ref 3.5–5.1)
Sodium: 140 mmol/L (ref 135–145)
Total Bilirubin: 0.2 mg/dL — ABNORMAL LOW (ref 0.3–1.2)
Total Protein: 5.5 g/dL — ABNORMAL LOW (ref 6.5–8.1)

## 2020-06-06 LAB — CBC WITH DIFFERENTIAL/PLATELET
Abs Immature Granulocytes: 0.04 K/uL (ref 0.00–0.07)
Basophils Absolute: 0 K/uL (ref 0.0–0.1)
Basophils Relative: 0 %
Eosinophils Absolute: 0 K/uL (ref 0.0–0.5)
Eosinophils Relative: 0 %
HCT: 37.9 % (ref 36.0–46.0)
Hemoglobin: 12.2 g/dL (ref 12.0–15.0)
Immature Granulocytes: 0 %
Lymphocytes Relative: 11 %
Lymphs Abs: 1.1 K/uL (ref 0.7–4.0)
MCH: 29.3 pg (ref 26.0–34.0)
MCHC: 32.2 g/dL (ref 30.0–36.0)
MCV: 90.9 fL (ref 80.0–100.0)
Monocytes Absolute: 0.2 K/uL (ref 0.1–1.0)
Monocytes Relative: 2 %
Neutro Abs: 8.8 K/uL — ABNORMAL HIGH (ref 1.7–7.7)
Neutrophils Relative %: 87 %
Platelets: 160 K/uL (ref 150–400)
RBC: 4.17 MIL/uL (ref 3.87–5.11)
RDW: 14 % (ref 11.5–15.5)
WBC: 10 K/uL (ref 4.0–10.5)
nRBC: 0 % (ref 0.0–0.2)

## 2020-06-06 LAB — PROTIME-INR
INR: 1.9 — ABNORMAL HIGH (ref 0.8–1.2)
Prothrombin Time: 20.9 seconds — ABNORMAL HIGH (ref 11.4–15.2)

## 2020-06-06 LAB — FERRITIN: Ferritin: 36 ng/mL (ref 11–307)

## 2020-06-06 LAB — MAGNESIUM: Magnesium: 2.2 mg/dL (ref 1.7–2.4)

## 2020-06-06 LAB — C-REACTIVE PROTEIN: CRP: 1.3 mg/dL — ABNORMAL HIGH

## 2020-06-06 LAB — D-DIMER, QUANTITATIVE: D-Dimer, Quant: 0.35 ug/mL-FEU (ref 0.00–0.50)

## 2020-06-06 MED ORDER — WARFARIN SODIUM 5 MG PO TABS
5.0000 mg | ORAL_TABLET | Freq: Once | ORAL | Status: AC
  Administered 2020-06-06: 5 mg via ORAL
  Filled 2020-06-06: qty 1

## 2020-06-06 NOTE — Care Management Important Message (Signed)
Medicare important message printed for [name], NCM to give to the patient. 

## 2020-06-06 NOTE — Progress Notes (Signed)
[place] for Coumadin Indication: atrial fibrillation  Allergies  Allergen Reactions  . Alendronate Other (See Comments)    Can not tolerate  . Celebrex [Celecoxib] Other (See Comments)    Can not tolerate  . Meloxicam Other (See Comments)    Avoid   . Myrbetriq [Mirabegron] Other (See Comments)    Can not tolerate  . Norco [Hydrocodone-Acetaminophen] Other (See Comments)    Dizziness and jerking  . Pradaxa [Dabigatran Etexilate Mesylate] Other (See Comments)    Can not tolerate  . Pradaxa [Dabigatran]   . Toviaz [Fesoterodine Fumarate Er] Other (See Comments)    Can not tolerate  . Vesicare [Solifenacin] Other (See Comments)    Can not tolerate    Patient Measurements: Height: 5' (152.4 cm) Weight: 58.8 kg (129 lb 10.1 oz) IBW/kg (Calculated) : 45.5  Vital Signs: Temp: 98.1 F (36.7 C) (01/24 1214) Temp Source: Oral (01/24 1214) BP: 144/73 (01/24 1214) Pulse Rate: 67 (01/24 1214)  Labs: Recent Labs    06/04/20 0336 06/05/20 0426 06/06/20 0427  HGB 12.1 12.0 12.2  HCT 39.2 38.5 37.9  PLT 171 169 160  LABPROT 23.0* 22.9* 20.9*  INR 2.1* 2.1* 1.9*  CREATININE 0.67 0.80 0.76   Estimated Creatinine Clearance: 33.7 mL/min (by C-G formula based on SCr of 0.76 mg/dL).  Medical History: Past Medical History:  Diagnosis Date  . Arthritis   . Atrial fibrillation ([place])   . Cataract    removed bilateral  . Chronic diastolic CHF (congestive heart failure) ([place]) 06/02/2020  . Clotting disorder ([place])    h/o blood clot left leg  . Dementia ([place])   . Depression   . DVT (deep venous thrombosis) ([place]) 2011  . GERD (gastroesophageal reflux disease)   . HB (heart block)    s/p PPM, most recent generator change 12/11 by JA  . Heart murmur   . Hiatal hernia   . Hyperlipidemia   . Hypertension   . Hypothyroidism   . Osteoporosis   . PTE (post-transplant erythrocytosis)   . Pulmonary embolism ([place]) 2011   on coumadin  . UTI  (lower urinary tract infection)     Medications:  Coumadin 5mg  daily po PTA - LD 1/20  Assessment: 87 yo F admitted with COVID PNA on warfarin PTA for Afib.  INR 2.2- therapeutic on admission.  No bleeding noted. CBC, Scr WNL.    INR slightly subtherapeutic at 1.9   CBC stable   25-100% meals charted as eaten on 1/23   Goal of Therapy:  INR 2-3   Plan:   Coumadin 5mg  po x1 today at 1600  Daily INR  Monitor for s/sx of bleeding    Royetta Asal, PharmD, BCPS 06/06/2020 12:37 PM

## 2020-06-06 NOTE — Progress Notes (Signed)
Physical Therapy Treatment Patient Details Name: [name] MRN: [id number] DOB: 1922/09/22 Today's Date: 06/06/2020    History of Present Illness Pt admitted with weakness/confusion and positive COVID test.  Pt with hx of PE, DVT, dementia, CHF, a-fib, L TKR, pacemaker, chronic diarrhea, and myoclonic jerking    PT Comments    Pt is very sweet and HOH "I left my hearing aides at home".  Assisted OOB to amb to bathroom.  General transfer comment: good use of hands to steady self and fair balance and self ability to rise off toilet and perform self peri care. General Gait Details: tolerated a functional distance with good use of walker with no overt LOB.  Avg RA was 97% with no dyspnea.  Pt moving well and plans to return to [facility]   Follow Up Recommendations  Home health PT (at [facility])     Equipment Recommendations  None recommended by PT    Recommendations for Other Services       Precautions / Restrictions Precautions Precautions: Fall Precaution Comments: HOH Restrictions Weight Bearing Restrictions: No    Mobility  Bed Mobility Overal bed mobility: Modified Independent             General bed mobility comments: Increased time but no physical assist to EOB sitting  Transfers Overall transfer level: Needs assistance Equipment used: Rolling walker (2 wheeled) Transfers: Sit to/from [facility] Sit to Stand: Supervision Stand pivot transfers: Supervision       General transfer comment: good use of hands to steady self and fair balance and self ability to rise off toilet and perform self peri care  Ambulation/Gait Ambulation/Gait assistance: Supervision Gait Distance (Feet): 55 Feet (several laps around room) Assistive device: Rolling walker (2 wheeled) Gait Pattern/deviations: Step-through pattern;Decreased step length - right;Decreased step length - left;Shuffle;Trunk flexed Gait velocity: decreased   General Gait  Details: tolerated a functional distance with good use of walker with no overt LOB.  Avg RA was 97% with no dyspnea.   Stairs             Wheelchair Mobility    Modified Rankin (Stroke Patients Only)       Balance                                            Cognition Arousal/Alertness: Awake/alert Behavior During Therapy: WFL for tasks assessed/performed;Impulsive Overall Cognitive Status: History of cognitive impairments - at baseline                                 General Comments: AxO x 3 Hx dementia but very sweet and following all commands as well as good recall.  Very functional      Exercises      General Comments        Pertinent Vitals/Pain Pain Assessment: No/denies pain    Home Living                      Prior Function            PT Goals (current goals can now be found in the care plan section) Progress towards PT goals: Progressing toward goals    Frequency    Min 3X/week      PT Plan Current plan remains appropriate  Co-evaluation              AM-PAC PT "6 Clicks" Mobility   Outcome Measure  Help needed turning from your back to your side while in a flat bed without using bedrails?: None Help needed moving from lying on your back to sitting on the side of a flat bed without using bedrails?: None Help needed moving to and from a bed to a chair (including a wheelchair)?: None Help needed standing up from a chair using your arms (e.g., wheelchair or bedside chair)?: None Help needed to walk in hospital room?: None Help needed climbing 3-5 steps with a railing? : A Little 6 Click Score: 23    End of Session Equipment Utilized During Treatment: Gait belt Activity Tolerance: Patient tolerated treatment well Patient left: in bed;with call bell/phone within reach;with bed alarm set Nurse Communication: Mobility status PT Visit Diagnosis: Unsteadiness on feet (R26.81);Difficulty in  walking, not elsewhere classified (R26.2)     Time: 4008-6761 PT Time Calculation (min) (ACUTE ONLY): 16 min  Charges:  $Gait Training: 8-22 mins                     Rica Koyanagi  PTA Acute  Rehabilitation Services Pager      [phone] Office      [phone]

## 2020-06-06 NOTE — TOC Progression Note (Signed)
Transition of Care [facility]) - Progression Note    Patient Details  Name: [name] MRN: [id number] Date of Birth: 09/23/1922  [facility] CM/SW Contact  Purcell Mouton, RN Phone Number: 06/06/2020, 2:33 PM  Clinical Narrative:     Spoke with pt's daughter concerning discharge plans and explained that a call was made to [facility] concerning pt needing HHPT. [facility] have agency there and will see the pt. Pt's daughter was okay with this information.   Expected Discharge Plan: Home/Self Care Barriers to Discharge: No Barriers Identified  Expected Discharge Plan and Services Expected Discharge Plan: Home/Self Care       Living arrangements for the past 2 months: Single Family Home Expected Discharge Date: 06/06/20                                     Social Determinants of Health (SDOH) Interventions    Readmission Risk Interventions No flowsheet data found.

## 2020-06-06 NOTE — Discharge Summary (Signed)
Physician Discharge Summary  [name] WCB:[id number] DOB: August 26, 1922 DOA: 06/02/2020  PCP: [name], MD  Admit date: 06/02/2020 Discharge date: 06/06/2020  Admitted From: Independent living Disposition: Independent living  Recommendations for Outpatient Follow-up:  1. Follow up with PCP in 1-2 weeks  Home Health: PT Equipment/Devices: none  Discharge Condition: stable CODE STATUS: DNR Diet recommendation: regular  HPI: Per admitting MD, [name] is a 87 y.o. female with medical history significant of dementia, hypertension, A. fib on Coumadin, PE/DVT 2011,  GERD, HLD, HTN, hypothyroidism, myoclonic jerking, status post pacemaker Presented with  generalized weakness cough for past 1 wk. Patient was too weak to transfer from bed to chair.  This is not normal for her.  Noted to have hypoxia down to 88% on room air started 2 L per EMS. he has history of myoclonic jerking and has recently started on Klonopin Hx of chronic diarrhea Family reports confusion She uses a walker at baseline Denies any sick contacts They have been around daughter and son-in-law  Hospital Course / Discharge diagnoses: Principal Problem Acute hypoxic respiratory failure due to pneumonia due to COVID-19 -patient was admitted to the hospital with COVID-19 pneumonia, mild hypoxia requiring 2 L.  Chest x-ray was actually clear but clinically was treated as pneumonia.  She received Remdesivir and completed 5 doses while hospitalized.  She also received Decadron, and will be finishing 10-day course as an outpatient.  She was weaned off to room air, able to walk at her baseline with physical therapy, will be discharged home in stable condition.  Active Problems Paroxysmal atrial fibrillation, history of third-degree AV block -Does not appear to be on rate controlling medications, continue Coumadin. Has a pacemaker Hyperlipidemia -Chronic, stable Hypothyroidism -Continue Synthroid Myoclonic  jerking -Klonopin on hold.  Daughter tells me that she took half a pill for couple of days a week ago and was too fatigued and they discontinued this  Sepsis ruled out   Discharge Instructions   Allergies as of 06/06/2020      Reactions   Alendronate Other (See Comments)   Can not tolerate   Celebrex [celecoxib] Other (See Comments)   Can not tolerate   Meloxicam Other (See Comments)   Avoid   Myrbetriq [mirabegron] Other (See Comments)   Can not tolerate   Norco [hydrocodone-acetaminophen] Other (See Comments)   Dizziness and jerking   Pradaxa [dabigatran Etexilate Mesylate] Other (See Comments)   Can not tolerate   Pradaxa [dabigatran]    Toviaz [fesoterodine Fumarate Er] Other (See Comments)   Can not tolerate   Vesicare [solifenacin] Other (See Comments)   Can not tolerate      Medication List    STOP taking these medications   clonazePAM 0.5 MG tablet Commonly known as: KLONOPIN     TAKE these medications   Calcium Carb-Cholecalciferol 600-800 MG-UNIT Tabs Take 1 tablet by mouth daily.   cholecalciferol 1000 units tablet Commonly known as: VITAMIN D Take 1,000 Units by mouth daily.   dexamethasone 6 MG tablet Commonly known as: DECADRON Take 1 tablet (6 mg total) by mouth daily for 7 days.   gabapentin 300 MG capsule Commonly known as: NEURONTIN TAKE 1 CAPSULE TWICE DAILY   levothyroxine 50 MCG tablet Commonly known as: SYNTHROID TAKE 1 TABLET (50 MCG TOTAL) BY MOUTH DAILY BEFORE BREAKFAST.   loratadine 10 MG tablet Commonly known as: CLARITIN Take 10 mg by mouth daily.   memantine 10 MG tablet Commonly known as: NAMENDA TAKE  1 TABLET TWICE DAILY   Nitrostat 0.4 MG SL tablet Generic drug: nitroGLYCERIN Place 0.4 mg under the tongue every 5 (five) minutes as needed for chest pain (MAX 3 TABLETS).   omeprazole 20 MG capsule Commonly known as: PRILOSEC TAKE 1 CAPSULE TWICE DAILY What changed: when to take this   potassium chloride 20 MEQ  packet Commonly known as: KLOR-CON Take 20 mEq by mouth daily.   pravastatin 20 MG tablet Commonly known as: PRAVACHOL TAKE 1 TABLET EVERY DAY   PRESERVISION AREDS 2 PO Take 1 capsule by mouth 2 (two) times daily.   sertraline 25 MG tablet Commonly known as: ZOLOFT TAKE 1 TABLET IN THE MORNING, AND 2 TABLETS IN THE EVENING AS DIRECTED. TOTAL 3 TABLETS DAILY. What changed: See the new instructions.   torsemide 20 MG tablet Commonly known as: DEMADEX Take 20 mg by mouth daily.   warfarin 5 MG tablet Commonly known as: COUMADIN Take as directed. If you are unsure how to take this medication, talk to your nurse or doctor. Original instructions: TAKE 1 TABLET DAILY OR AS DIRECTED BY THE COUMADIN CLINIC. What changed: See the new instructions.       Contact information for after-discharge care    Destination    [facility] .   Service: Skilled Nursing Contact information: [id number]                  Consultations:  None   Procedures/Studies:  DG Chest Port 1 View  Result Date: 06/02/2020 CLINICAL DATA:  Hypoxia EXAM: PORTABLE CHEST 1 VIEW COMPARISON:  11/01/2013 FINDINGS: Left-sided pacing device as before. The right lung is grossly clear. Borderline cardiomegaly with aortic atherosclerosis. Patchy opacity left base. No pneumothorax IMPRESSION: Patchy atelectasis or infiltrate at the left base. Electronically Signed   By: Donavan Foil M.D.   On: 06/02/2020 19:05   CUP PACEART REMOTE DEVICE CHECK  Result Date: 06/02/2020 Scheduled remote reviewed. Normal device function.  Atrial arrhythmias were all less than one minute Next remote 91 days. Kathy Breach, RN, CCDS, [employer]    Subjective: - no chest pain, shortness of breath, no abdominal pain, nausea or vomiting.   Discharge Exam: BP (!) 144/73 (BP Location: Left Arm)   Pulse 67   Temp 98.1 F (36.7 C) (Oral)   Resp  16   Ht 5' (1.524 m)   Wt 58.8 kg   SpO2 94%   BMI 25.32 kg/m   General: Pt is alert, awake, not in acute distress Cardiovascular: RRR, S1/S2 +, no rubs, no gallops Respiratory: CTA bilaterally, no wheezing, no rhonchi Abdominal: Soft, NT, ND, bowel sounds + Extremities: no edema, no cyanosis    The results of significant diagnostics from this hospitalization (including imaging, microbiology, ancillary and laboratory) are listed below for reference.     Microbiology: Recent Results (from the past 240 hour(s))  Urine culture     Status: Abnormal   Collection Time: 06/02/20  9:20 PM   Specimen: Urine, Clean Catch  Result Value Ref Range Status   Specimen Description   Final    URINE, CLEAN CATCH Performed at [facility], [place] [address], [place], [place] [address]    Special Requests   Final    NONE Performed at [facility], [place] [address] [place], [place] [address]    Culture >=100,000 COLONIES/mL KLEBSIELLA PNEUMONIAE (A)  Final   Report Status 06/05/2020 FINAL  Final   Organism ID, Bacteria KLEBSIELLA PNEUMONIAE (A)  Final      Susceptibility   Klebsiella pneumoniae - MIC*    AMPICILLIN RESISTANT Resistant     CEFAZOLIN <=4 SENSITIVE Sensitive     CEFEPIME <=0.12 SENSITIVE Sensitive     CEFTRIAXONE <=0.25 SENSITIVE Sensitive     CIPROFLOXACIN <=0.25 SENSITIVE Sensitive     GENTAMICIN <=1 SENSITIVE Sensitive     IMIPENEM 0.5 SENSITIVE Sensitive     NITROFURANTOIN 64 INTERMEDIATE Intermediate     TRIMETH/SULFA <=20 SENSITIVE Sensitive     AMPICILLIN/SULBACTAM <=2 SENSITIVE Sensitive     PIP/TAZO <=4 SENSITIVE Sensitive     * >=100,000 COLONIES/mL KLEBSIELLA PNEUMONIAE  Culture, blood (Routine X 2) w Reflex to ID Panel     Status: None (Preliminary result)   Collection Time: 06/03/20  4:27 AM   Specimen: BLOOD  Result Value Ref Range Status   Specimen Description   Final    BLOOD SITE NOT SPECIFIED Performed at [facility], [address], [place], [place] [address]    Special Requests   Final    BOTTLES DRAWN AEROBIC ONLY Blood Culture adequate volume Performed at [facility] [address] [place], [place] [address]    Culture   Final    NO GROWTH 2 DAYS Performed at [facility] [address], [place], [place] [address]    Report Status PENDING  Incomplete     Labs: Basic Metabolic Panel: Recent Labs  Lab 06/03/20 0200 06/03/20 0427 06/04/20 0336 06/05/20 0426 06/06/20 0427  NA 141 142 140 138 140  K 4.4 3.5 3.6 4.2 4.1  CL 105 106 107 106 108  CO2 22 26 25 27 24   GLUCOSE 102* 113* 149* 145* 153*  BUN 22 23 28* 26* 30*  CREATININE 1.00 0.83 0.67 0.80 0.76  CALCIUM 8.4* 8.4* 8.9 9.1 9.1  MG  --  2.0 2.0 2.1 2.2   Liver Function Tests: Recent Labs  Lab 06/03/20 0200 06/03/20 0427 06/04/20 0336 06/05/20 0426 06/06/20 0427  AST 29 14* 17 16 15   ALT 13 13 16 16 20   ALKPHOS 60 64 62 63 56  BILITOT 0.4 0.4 0.6 0.4 0.2*  PROT 6.1* 5.9* 6.1* 5.8* 5.5*  ALBUMIN 3.3* 3.1* 3.3* 3.1* 3.0*   CBC: Recent Labs  Lab 06/02/20 1900 06/03/20 0427 06/04/20 0336 06/05/20 0426 06/06/20 0427  WBC 14.0* 10.6* 10.3 10.6* 10.0  NEUTROABS 11.5* 8.5* 8.3* 9.3* 8.8*  HGB 12.1 11.1* 12.1 12.0 12.2  HCT 38.7 36.0 39.2 38.5 37.9  MCV 93.7 93.8 93.6 94.1 90.9  PLT 171 153 171 169 160   CBG: No results for input(s): GLUCAP in the last 168 hours. Hgb A1c No results for input(s): HGBA1C in the last 72 hours. Lipid Profile No results for input(s): CHOL, HDL, LDLCALC, TRIG, CHOLHDL, LDLDIRECT in the last 72 hours. Thyroid function studies No results for input(s): TSH, T4TOTAL, T3FREE, THYROIDAB in the last 72 hours.  Invalid input(s): FREET3 Urinalysis    Component Value Date/Time   COLORURINE YELLOW 06/02/2020 2120   APPEARANCEUR HAZY (A) 06/02/2020 2120   LABSPEC 1.013 06/02/2020 2120   PHURINE 6.0 06/02/2020 2120   GLUCOSEU NEGATIVE 06/02/2020 2120    HGBUR NEGATIVE 06/02/2020 2120   BILIRUBINUR NEGATIVE 06/02/2020 2120   [place] NEGATIVE 06/02/2020 2120   PROTEINUR NEGATIVE 06/02/2020 2120   UROBILINOGEN 0.2 11/01/2013 1010   NITRITE NEGATIVE 06/02/2020 2120   LEUKOCYTESUR TRACE (A) 06/02/2020  2120    FURTHER DISCHARGE INSTRUCTIONS:   Get Medicines reviewed and adjusted: Please take all your medications with you for your next visit with your Primary MD   Laboratory/radiological data: Please request your Primary MD to go over all hospital tests and procedure/radiological results at the follow up, please ask your Primary MD to get all Hospital records sent to his/her office.   In some cases, they will be blood work, cultures and biopsy results pending at the time of your discharge. Please request that your primary care M.D. goes through all the records of your hospital data and follows up on these results.   Also Note the following: If you experience worsening of your admission symptoms, develop shortness of breath, life threatening emergency, suicidal or homicidal thoughts you must seek medical attention immediately by calling 911 or calling your MD immediately  if symptoms less severe.   You must read complete instructions/literature along with all the possible adverse reactions/side effects for all the Medicines you take and that have been prescribed to you. Take any new Medicines after you have completely understood and accpet all the possible adverse reactions/side effects.    Do not drive when taking Pain medications or sleeping medications (Benzodaizepines)   Do not take more than prescribed Pain, Sleep and Anxiety Medications. It is not advisable to combine anxiety,sleep and pain medications without talking with your primary care practitioner   Special Instructions: If you have smoked or chewed Tobacco  in the last 2 yrs please stop smoking, stop any regular Alcohol  and or any Recreational drug use.   Wear Seat belts while  driving.   Please note: You were cared for by a hospitalist during your hospital stay. Once you are discharged, your primary care physician will handle any further medical issues. Please note that NO REFILLS for any discharge medications will be authorized once you are discharged, as it is imperative that you return to your primary care physician (or establish a relationship with a primary care physician if you do not have one) for your post hospital discharge needs so that they can reassess your need for medications and monitor your lab values.  Time coordinating discharge: 40 minutes  SIGNED:  Marzetta Board, MD, PhD 06/06/2020, 1:12 PM

## 2020-06-07 ENCOUNTER — Telehealth: Payer: Self-pay | Admitting: *Deleted

## 2020-06-07 ENCOUNTER — Ambulatory Visit: Payer: Medicare Other | Admitting: Nurse Practitioner

## 2020-06-07 NOTE — Telephone Encounter (Signed)
Transition Care Management Follow-Up Telephone Call   Date discharged and where:06/06/2020 [facility]  How have you been since you were released from the hospital? Getting better, still weak.  Any patient concerns? No  Items Reviewed:   Meds: Yes  Allergies:Yes  Dietary Changes Reviewed:Yes  Functional Questionnaire:  Independent-I Dependent-D  ADLs:I with assistance   Dressing- I    Eating-I   Maintaining continence-I   Transferring-I with assistance   Transportation-D   Meal Prep-D   Managing Meds- I with assistance   Confirmed importance and Date/Time of follow-up visits scheduled:06/08/2020 with Dr. Lyndel Safe   Confirmed with patient if condition worsens to call PCP or go to the Emergency Dept. Patient was given office number and encouraged to call back with questions or concerns: Yes

## 2020-06-07 NOTE — Telephone Encounter (Signed)
Order faxed.

## 2020-06-07 NOTE — Telephone Encounter (Signed)
Pt's husband and daughter called stating that pt was discharged from the hospital yesterday. Pt usuaully gets INR checked at [facility]. Called and spoke to Cherylann Ratel who was aware [name] had tested positive for Covid and was in the hospital and was discharged yesterday. She said it was fine for pt to go on Thursday and have INR drawn at [facility]. Called and updated pt's daughter and made her aware that pt is to go on Thursday and have INR checked.

## 2020-06-08 ENCOUNTER — Other Ambulatory Visit: Payer: Self-pay

## 2020-06-08 ENCOUNTER — Ambulatory Visit: Payer: Medicare Other | Admitting: Internal Medicine

## 2020-06-08 ENCOUNTER — Encounter: Payer: Self-pay | Admitting: Internal Medicine

## 2020-06-08 VITALS — BP 120/68 | HR 79 | Temp 96.6°F | Ht 60.0 in | Wt 128.6 lb

## 2020-06-08 DIAGNOSIS — G253 Myoclonus: Secondary | ICD-10-CM

## 2020-06-08 DIAGNOSIS — E782 Mixed hyperlipidemia: Secondary | ICD-10-CM

## 2020-06-08 DIAGNOSIS — I350 Nonrheumatic aortic (valve) stenosis: Secondary | ICD-10-CM | POA: Diagnosis not present

## 2020-06-08 DIAGNOSIS — M810 Age-related osteoporosis without current pathological fracture: Secondary | ICD-10-CM

## 2020-06-08 DIAGNOSIS — J1282 Pneumonia due to coronavirus disease 2019: Secondary | ICD-10-CM

## 2020-06-08 DIAGNOSIS — E039 Hypothyroidism, unspecified: Secondary | ICD-10-CM | POA: Diagnosis not present

## 2020-06-08 DIAGNOSIS — R531 Weakness: Secondary | ICD-10-CM | POA: Diagnosis not present

## 2020-06-08 DIAGNOSIS — R4189 Other symptoms and signs involving cognitive functions and awareness: Secondary | ICD-10-CM | POA: Diagnosis not present

## 2020-06-08 DIAGNOSIS — U071 COVID-19: Secondary | ICD-10-CM | POA: Diagnosis not present

## 2020-06-08 DIAGNOSIS — R600 Localized edema: Secondary | ICD-10-CM | POA: Diagnosis not present

## 2020-06-08 DIAGNOSIS — F339 Major depressive disorder, recurrent, unspecified: Secondary | ICD-10-CM

## 2020-06-08 DIAGNOSIS — I480 Paroxysmal atrial fibrillation: Secondary | ICD-10-CM

## 2020-06-08 LAB — CULTURE, BLOOD (ROUTINE X 2)
Culture: NO GROWTH
Special Requests: ADEQUATE

## 2020-06-08 NOTE — Patient Instructions (Addendum)
Start taking your Sertraline 25 mg one tablet in the morning and 2 tablet in the evening Can take Demadex 10 mg QD for Lower leg swelling If Cough gets worse with yellow sputum let us know Therapy referal is made for next week Discontinue Klonipin

## 2020-06-09 ENCOUNTER — Ambulatory Visit (INDEPENDENT_AMBULATORY_CARE_PROVIDER_SITE_OTHER): Payer: Medicare Other | Admitting: Cardiovascular Disease

## 2020-06-09 ENCOUNTER — Other Ambulatory Visit: Payer: Self-pay | Admitting: Cardiovascular Disease

## 2020-06-09 DIAGNOSIS — E785 Hyperlipidemia, unspecified: Secondary | ICD-10-CM | POA: Diagnosis not present

## 2020-06-09 DIAGNOSIS — Z96659 Presence of unspecified artificial knee joint: Secondary | ICD-10-CM | POA: Diagnosis not present

## 2020-06-09 DIAGNOSIS — R260 Ataxic gait: Secondary | ICD-10-CM | POA: Diagnosis not present

## 2020-06-09 DIAGNOSIS — R293 Abnormal posture: Secondary | ICD-10-CM | POA: Diagnosis not present

## 2020-06-09 DIAGNOSIS — N8110 Cystocele, unspecified: Secondary | ICD-10-CM | POA: Diagnosis not present

## 2020-06-09 DIAGNOSIS — Z5181 Encounter for therapeutic drug level monitoring: Secondary | ICD-10-CM

## 2020-06-09 DIAGNOSIS — M6281 Muscle weakness (generalized): Secondary | ICD-10-CM | POA: Diagnosis not present

## 2020-06-09 DIAGNOSIS — H269 Unspecified cataract: Secondary | ICD-10-CM | POA: Diagnosis not present

## 2020-06-09 DIAGNOSIS — M179 Osteoarthritis of knee, unspecified: Secondary | ICD-10-CM | POA: Diagnosis not present

## 2020-06-09 DIAGNOSIS — M545 Low back pain, unspecified: Secondary | ICD-10-CM | POA: Diagnosis not present

## 2020-06-09 DIAGNOSIS — F339 Major depressive disorder, recurrent, unspecified: Secondary | ICD-10-CM | POA: Diagnosis not present

## 2020-06-09 DIAGNOSIS — I4891 Unspecified atrial fibrillation: Secondary | ICD-10-CM | POA: Diagnosis not present

## 2020-06-09 DIAGNOSIS — M542 Cervicalgia: Secondary | ICD-10-CM | POA: Diagnosis not present

## 2020-06-09 LAB — CLIENT EDUCATION TRACKING

## 2020-06-09 LAB — PROTIME-INR
INR: 2 — AB (ref 0.9–1.1)
INR: 2 — ABNORMAL HIGH
Prothrombin Time: 19.3 s — ABNORMAL HIGH (ref 9.0–11.5)

## 2020-06-09 NOTE — Patient Instructions (Addendum)
Description   Called spoke with pt's husband, advised to have pt continue taking 1 tablet daily. Recheck in 1 week. ( Pt may start dexamethasone from hospital d/c). Orders faxed to [facility] [phone].

## 2020-06-09 NOTE — Progress Notes (Addendum)
Location:     Place of Service:   Clinic  Provider:   Code Status:  Goals of Care:  Advanced Directives 06/03/2020  Does Patient Have a Medical Advance Directive? No  Type of Advance Directive -  Does patient want to make changes to medical advance directive? -  Copy of [place] in Chart? -  Would patient like information on creating a medical advance directive? No - Patient declined     Chief Complaint  Patient presents with  . Medical Management of Chronic Issues    Patient returns to the clinic for follow up.    HPI: Patient is a 87 y.o. female seen today for an acute visit for Follow up from [facility] admission  Was admitted in the hospital from 01/20- 01/24 for Covid Pneumonia  Patient has a history of CHB s/p pacemaker,  PAF on Coumadin, moderate AS Also has history of cognitive impairment, myoclonic jerks thought to be functional on Zoloft Osteoporosis on Prolia per rheumatology Urinary incontinence Chronic diarrheafollowedby GI it now on cholestyramine PRN  Patient presented to ED with Lethargy and Hypoxia.  Chest Xray was positive for Pneumonia. Treated with Remdisvir for 5 days with Decadron for 10 days. And was discharged Came with daughter for follow up appointment Still has Productive cough. No Fever or SOB or chest pain Daughter says she is confused and very weak. Was having hard time walking with her walker and doing her ADLS. Her appetite is good They are planning to hire Caregiver for her and her husband     Past Medical History:  Diagnosis Date  . Arthritis   . Atrial fibrillation ([place])   . Cataract    removed bilateral  . Chronic diastolic CHF (congestive heart failure) ([place]) 06/02/2020  . Clotting disorder ([place])    h/o blood clot left leg  . Dementia ([place])   . Depression   . DVT (deep venous thrombosis) ([place]) 2011  . GERD (gastroesophageal reflux disease)   . HB (heart block)    s/p PPM, most recent generator change  12/11 by JA  . Heart murmur   . Hiatal hernia   . Hyperlipidemia   . Hypertension   . Hypothyroidism   . Osteoporosis   . PTE (post-transplant erythrocytosis)   . Pulmonary embolism ([place]) 2011   on coumadin  . UTI (lower urinary tract infection)     Past Surgical History:  Procedure Laterality Date  . BLADDER SURGERY     twice  . cataracts     bilateral  . CHOLECYSTECTOMY  09/2010  . COLONOSCOPY    . ESOPHAGOGASTRODUODENOSCOPY    . HEMORRHOID SURGERY    . hysterectomy (other)    . INNER EAR SURGERY    . JOINT REPLACEMENT    . pacemaker     most recent generator 12/11 by JA  . TOTAL KNEE ARTHROPLASTY Left     Allergies  Allergen Reactions  . Alendronate Other (See Comments)    Can not tolerate  . Celebrex [Celecoxib] Other (See Comments)    Can not tolerate  . Meloxicam Other (See Comments)    Avoid   . Myrbetriq [[place]abegron] Other (See Comments)    Can not tolerate  . Norco [Hydrocodone-Acetaminophen] Other (See Comments)    Dizziness and jerking  . Pradaxa [Dabigatran Etexilate Mesylate] Other (See Comments)    Can not tolerate  . Pradaxa [Dabigatran]   . Toviaz [Fesoterodine Fumarate Er] Other (See Comments)    Can not  tolerate  . Vesicare [Solifenacin] Other (See Comments)    Can not tolerate    Outpatient Encounter Medications as of 06/08/2020  Medication Sig  . Calcium Carb-Cholecalciferol 600-800 MG-UNIT TABS Take 1 tablet by mouth daily.  . cholecalciferol (VITAMIN D) 1000 UNITS tablet Take 1,000 Units by mouth daily.   [name] dexamethasone (DECADRON) 6 MG tablet Take 1 tablet (6 mg total) by mouth daily for 7 days.  [name] gabapentin (NEURONTIN) 300 MG capsule TAKE 1 CAPSULE TWICE DAILY (Patient taking differently: Take 300 mg by mouth 2 (two) times daily.)  . levothyroxine (SYNTHROID) 50 MCG tablet TAKE 1 TABLET (50 MCG TOTAL) BY MOUTH DAILY BEFORE BREAKFAST.  [name] loratadine (CLARITIN) 10 MG tablet Take 10 mg by mouth daily.  . memantine (NAMENDA) 10 MG tablet  TAKE 1 TABLET TWICE DAILY (Patient taking differently: Take 10 mg by mouth 2 (two) times daily.)  . Multiple Vitamins-Minerals (PRESERVISION AREDS 2 PO) Take 1 capsule by mouth 2 (two) times daily.  [name] NITROSTAT 0.4 MG SL tablet Place 0.4 mg under the tongue every 5 (five) minutes as needed for chest pain (MAX 3 TABLETS).   [name] omeprazole (PRILOSEC) 20 MG capsule TAKE 1 CAPSULE TWICE DAILY (Patient taking differently: Take 20 mg by mouth 2 (two) times daily before a meal.)  . potassium chloride (KLOR-CON) 20 MEQ packet Take 20 mEq by mouth daily.  . pravastatin (PRAVACHOL) 20 MG tablet TAKE 1 TABLET EVERY DAY (Patient taking differently: Take 20 mg by mouth daily.)  . sertraline (ZOLOFT) 25 MG tablet TAKE 1 TABLET IN THE MORNING, AND 2 TABLETS IN THE EVENING AS DIRECTED. TOTAL 3 TABLETS DAILY. (Patient taking differently: Take 25 mg by mouth in the morning and at bedtime.)  . torsemide (DEMADEX) 20 MG tablet Take 20 mg by mouth daily.  [name] warfarin (COUMADIN) 5 MG tablet TAKE 1 TABLET DAILY OR AS DIRECTED BY THE COUMADIN CLINIC. (Patient taking differently: Take 5 mg by mouth See admin instructions. Take 1 tablet daily or as directed by the Coumadin Clinic.)  . [DISCONTINUED] colestipol (COLESTID) 5 g granules Take 5 g by mouth daily with supper.   No facility-administered encounter medications on file as of 06/08/2020.    Review of Systems:  Review of Systems  Constitutional: Positive for activity change.  HENT: Negative.   Respiratory: Positive for cough.   Cardiovascular: Positive for leg swelling.  Gastrointestinal: Negative.   Genitourinary: Negative.   Musculoskeletal: Positive for gait problem.  Skin: Negative.   Neurological: Positive for weakness.  Psychiatric/Behavioral: Positive for confusion.    Health Maintenance  Topic Date Due  . DEXA SCAN  Never done  . TETANUS/TDAP  03/10/2022  . INFLUENZA VACCINE  Completed  . COVID-19 Vaccine  Completed  . PNA vac Low Risk Adult   Completed    Physical Exam: Vitals:   06/08/20 1436  BP: 120/68  Pulse: 79  Temp: (!) 96.6 F (35.9 C)  SpO2: 96%  Weight: 128 lb 9.6 oz (58.3 kg)  Height: 5' (1.524 m)   Body mass index is 25.12 kg/m. Physical Exam Vitals reviewed.  Constitutional:      Comments: Looks Confused and tired Very hard of hearing  HENT:     Head: Normocephalic.     Nose: Nose normal.     Mouth/Throat:     Mouth: Mucous membranes are moist.     Pharynx: Oropharynx is clear.  Eyes:     Pupils: Pupils are equal, round, and reactive to light.  Cardiovascular:     Rate and Rhythm: Normal rate. Rhythm irregular.     Pulses: Normal pulses.     Heart sounds: Murmur heard.    Pulmonary:     Effort: Pulmonary effort is normal.     Breath sounds: Normal breath sounds.  Abdominal:     General: Abdomen is flat. Bowel sounds are normal.  Musculoskeletal:        General: Swelling present.     Cervical back: Neck supple.  Skin:    General: Skin is warm.  Neurological:     General: No focal deficit present.     Mental Status: She is alert.  Psychiatric:        Mood and Affect: Mood normal.        Thought Content: Thought content normal.     Labs reviewed: Basic Metabolic Panel: Recent Labs    10/15/19 0000 03/31/20 0820 04/25/20 0800 06/02/20 1900 06/04/20 0336 06/05/20 0426 06/06/20 0427  NA 141 141 142   < > 140 138 140  K 4.3 4.2 4.3   < > 3.6 4.2 4.1  CL 104 104 103   < > 107 106 108  CO2 28 32 29   < > 25 27 24   GLUCOSE 83 93 91   < > 149* 145* 153*  BUN 22 21 21    < > 28* 26* 30*  CREATININE 0.73 0.77 0.95*   < > 0.67 0.80 0.76  CALCIUM 9.7 9.7 9.6   < > 8.9 9.1 9.1  MG  --   --   --    < > 2.0 2.1 2.2  TSH 2.17 2.19 3.42  --   --   --   --    < > = values in this interval not displayed.   Liver Function Tests: Recent Labs    06/04/20 0336 06/05/20 0426 06/06/20 0427  AST 17 16 15   ALT 16 16 20   ALKPHOS 62 63 56  BILITOT 0.6 0.4 0.2*  PROT 6.1* 5.8* 5.5*   ALBUMIN 3.3* 3.1* 3.0*   No results for input(s): LIPASE, AMYLASE in the last 8760 hours. No results for input(s): AMMONIA in the last 8760 hours. CBC: Recent Labs    06/04/20 0336 06/05/20 0426 06/06/20 0427  WBC 10.3 10.6* 10.0  NEUTROABS 8.3* 9.3* 8.8*  HGB 12.1 12.0 12.2  HCT 39.2 38.5 37.9  MCV 93.6 94.1 90.9  PLT 171 169 160   Lipid Panel: Recent Labs    10/15/19 0000 06/03/20 0121  CHOL 158  --   HDL 60  --   LDLCALC 78  --   TRIG 116 76  CHOLHDL 2.6  --    No results found for: HGBA1C  Procedures since last visit: DG Chest Port 1 View  Result Date: 06/02/2020 CLINICAL DATA:  Hypoxia EXAM: PORTABLE CHEST 1 VIEW COMPARISON:  11/01/2013 FINDINGS: Left-sided pacing device as before. The right lung is grossly clear. Borderline cardiomegaly with aortic atherosclerosis. Patchy opacity left base. No pneumothorax IMPRESSION: Patchy atelectasis or infiltrate at the left base. Electronically Signed   By: [place] Foil M.D.   On: 06/02/2020 19:05   CUP PACEART REMOTE DEVICE CHECK  Result Date: 06/02/2020 Scheduled remote reviewed. Normal device function.  Atrial arrhythmias were all less than one minute Next remote 91 days. Kathy Breach, RN, CCDS, [employer]   Assessment/Plan Pneumonia due to COVID-19 virus Cough And fatigue persists On Decadron for 3 more days Continue To monitor Will  let us know if it gets Worse  Bilateral leg edema Can restart her Demadex  Weakness Referal to THerapy  Cognitive impairment Worsening  They are planning to hire Caregivers Continue Namenda Aricept discontinued Paroxysmal atrial fibrillation ([place]) On Coumadin  Depression, recurrent ([place]) Restart her Zoloft  Myoclonic jerking Manage with Zoloft and Neurontin Discontinue Klonopin due to feeling lethargic  Moderate aortic stenosis Stable Acquired hypothyroidism Normal in 12/31  Age-related osteoporosis without current pathological fracture On Prolia  Shot  Mixed hyperlipidemia Continue statin    Labs/tests ordered:  * No order type specified * Next appt:  06/22/2020

## 2020-06-10 ENCOUNTER — Other Ambulatory Visit: Payer: Self-pay | Admitting: Internal Medicine

## 2020-06-15 DIAGNOSIS — U071 COVID-19: Secondary | ICD-10-CM | POA: Diagnosis not present

## 2020-06-15 DIAGNOSIS — R2681 Unsteadiness on feet: Secondary | ICD-10-CM | POA: Diagnosis not present

## 2020-06-15 DIAGNOSIS — R293 Abnormal posture: Secondary | ICD-10-CM | POA: Diagnosis not present

## 2020-06-15 DIAGNOSIS — R2689 Other abnormalities of gait and mobility: Secondary | ICD-10-CM | POA: Diagnosis not present

## 2020-06-15 DIAGNOSIS — R260 Ataxic gait: Secondary | ICD-10-CM | POA: Diagnosis not present

## 2020-06-15 DIAGNOSIS — M179 Osteoarthritis of knee, unspecified: Secondary | ICD-10-CM | POA: Diagnosis not present

## 2020-06-15 DIAGNOSIS — M6281 Muscle weakness (generalized): Secondary | ICD-10-CM | POA: Diagnosis not present

## 2020-06-15 DIAGNOSIS — J189 Pneumonia, unspecified organism: Secondary | ICD-10-CM | POA: Diagnosis not present

## 2020-06-15 NOTE — Progress Notes (Signed)
Remote pacemaker transmission.   

## 2020-06-16 ENCOUNTER — Telehealth: Payer: Self-pay | Admitting: Internal Medicine

## 2020-06-16 ENCOUNTER — Ambulatory Visit (INDEPENDENT_AMBULATORY_CARE_PROVIDER_SITE_OTHER): Payer: Medicare Other | Admitting: *Deleted

## 2020-06-16 ENCOUNTER — Other Ambulatory Visit: Payer: Self-pay | Admitting: Internal Medicine

## 2020-06-16 DIAGNOSIS — H353231 Exudative age-related macular degeneration, bilateral, with active choroidal neovascularization: Secondary | ICD-10-CM | POA: Diagnosis not present

## 2020-06-16 DIAGNOSIS — I4891 Unspecified atrial fibrillation: Secondary | ICD-10-CM | POA: Diagnosis not present

## 2020-06-16 DIAGNOSIS — Z961 Presence of intraocular lens: Secondary | ICD-10-CM | POA: Diagnosis not present

## 2020-06-16 DIAGNOSIS — Z5181 Encounter for therapeutic drug level monitoring: Secondary | ICD-10-CM | POA: Diagnosis not present

## 2020-06-16 LAB — PROTIME-INR
INR: 3.7 — AB (ref 0.9–1.1)
INR: 3.7 — ABNORMAL HIGH
Prothrombin Time: 34.5 s — ABNORMAL HIGH (ref 9.0–11.5)

## 2020-06-16 NOTE — Patient Instructions (Signed)
Description   Called spoke with pt's husband, advised to have pt hold today's dose then continue taking 1 tablet daily. Recheck in 1 week. Orders faxed to [facility] [phone].

## 2020-06-16 NOTE — Telephone Encounter (Signed)
Pts daughter(Brenda) called to ask for referral to [facility] bc Ms Symons misplaced 1 of her hearing aids & needs referral to get appt.  Thanks, Vilinda Blanks

## 2020-06-20 ENCOUNTER — Other Ambulatory Visit: Payer: Self-pay | Admitting: Nurse Practitioner

## 2020-06-20 DIAGNOSIS — H9190 Unspecified hearing loss, unspecified ear: Secondary | ICD-10-CM

## 2020-06-20 DIAGNOSIS — H903 Sensorineural hearing loss, bilateral: Secondary | ICD-10-CM | POA: Diagnosis not present

## 2020-06-20 DIAGNOSIS — E039 Hypothyroidism, unspecified: Secondary | ICD-10-CM | POA: Diagnosis not present

## 2020-06-20 NOTE — Progress Notes (Signed)
Referral to audiology  placed for hearing aid replacement.

## 2020-06-20 NOTE — Telephone Encounter (Signed)
To [facility] and Dr. Lyndel Safe. Patient has an apnt today and need referral in order to keep it.

## 2020-06-22 ENCOUNTER — Other Ambulatory Visit: Payer: Self-pay

## 2020-06-22 ENCOUNTER — Encounter: Payer: Medicare Other | Admitting: Internal Medicine

## 2020-06-23 ENCOUNTER — Other Ambulatory Visit: Payer: Self-pay | Admitting: Internal Medicine

## 2020-06-23 ENCOUNTER — Ambulatory Visit (INDEPENDENT_AMBULATORY_CARE_PROVIDER_SITE_OTHER): Payer: Medicare Other | Admitting: *Deleted

## 2020-06-23 DIAGNOSIS — I4891 Unspecified atrial fibrillation: Secondary | ICD-10-CM

## 2020-06-23 DIAGNOSIS — Z5181 Encounter for therapeutic drug level monitoring: Secondary | ICD-10-CM | POA: Diagnosis not present

## 2020-06-23 LAB — PROTIME-INR
INR: 2.4 — AB (ref 0.9–1.1)
INR: 2.4 — ABNORMAL HIGH
Prothrombin Time: 22.9 s — ABNORMAL HIGH (ref 9.0–11.5)

## 2020-06-23 NOTE — Patient Instructions (Signed)
Description   Called spoke with pt's husband, advised to have pt to continue taking the dose she has been taking, which is 1 tablet daily except 1/2 tablet on Sunday. Recheck in 2 weeks. Orders faxed to [facility] [phone].

## 2020-06-29 ENCOUNTER — Other Ambulatory Visit: Payer: Self-pay

## 2020-06-29 ENCOUNTER — Encounter: Payer: Self-pay | Admitting: Internal Medicine

## 2020-06-29 ENCOUNTER — Non-Acute Institutional Stay: Payer: Medicare Other | Admitting: Internal Medicine

## 2020-06-29 VITALS — BP 106/68 | HR 97 | Temp 96.5°F | Ht 60.0 in | Wt 135.7 lb

## 2020-06-29 DIAGNOSIS — I480 Paroxysmal atrial fibrillation: Secondary | ICD-10-CM

## 2020-06-29 DIAGNOSIS — F339 Major depressive disorder, recurrent, unspecified: Secondary | ICD-10-CM

## 2020-06-29 DIAGNOSIS — L03115 Cellulitis of right lower limb: Secondary | ICD-10-CM

## 2020-06-29 DIAGNOSIS — R5382 Chronic fatigue, unspecified: Secondary | ICD-10-CM | POA: Diagnosis not present

## 2020-06-29 DIAGNOSIS — G253 Myoclonus: Secondary | ICD-10-CM

## 2020-06-29 DIAGNOSIS — R4189 Other symptoms and signs involving cognitive functions and awareness: Secondary | ICD-10-CM | POA: Diagnosis not present

## 2020-06-29 DIAGNOSIS — U099 Post covid-19 condition, unspecified: Secondary | ICD-10-CM

## 2020-06-29 DIAGNOSIS — K529 Noninfective gastroenteritis and colitis, unspecified: Secondary | ICD-10-CM | POA: Diagnosis not present

## 2020-06-29 DIAGNOSIS — R600 Localized edema: Secondary | ICD-10-CM | POA: Diagnosis not present

## 2020-06-29 DIAGNOSIS — G9332 Myalgic encephalomyelitis/chronic fatigue syndrome: Secondary | ICD-10-CM

## 2020-06-29 DIAGNOSIS — L03116 Cellulitis of left lower limb: Secondary | ICD-10-CM

## 2020-06-29 MED ORDER — DOXYCYCLINE HYCLATE 100 MG PO TABS: 100.0000 mg | ORAL_TABLET | Freq: Two times a day (BID) | ORAL | 0 refills | Status: DC

## 2020-06-29 NOTE — Patient Instructions (Addendum)
Go back of taking Torsemide 20 mg everyday Start Antibiotics first dose today If develop fever or Lethargy go to ED

## 2020-06-29 NOTE — Progress Notes (Signed)
Location: [place] of Service:  Clinic (12)  Provider:   Code Status:  Goals of Care:  Advanced Directives 06/03/2020  Does Patient Have a Medical Advance Directive? No  Type of Advance Directive -  Does patient want to make changes to medical advance directive? -  Copy of [place] in Chart? -  Would patient like information on creating a medical advance directive? No - Patient declined     Chief Complaint  Patient presents with  . Acute Visit    Patient returns to the clinic because of left leg pain, redness and swollen.     HPI: Patient is a 87 y.o. female seen today for an acute visit for Bilateral Leg Edema with Redness.Left More then Right   Patient has a history of CHB s/p pacemaker,  PAF on Coumadin, moderate AS Also has history of cognitive impairment, myoclonic jerks thought to be functional on Zoloft Osteoporosis on Prolia per rheumatology Urinary incontinence Chronic diarrheafollowedby GI it now on cholestyraminePRN Was admitted in the hospital from 01/20- 01/24 for Covid Pneumonia  Came to the office with Bilateral Leg edema and redness and tender No Fever or chills Walking with her Walker. Mental status back to baseline Weakness after covid is better but still there No SOB or chest Pain   Past Medical History:  Diagnosis Date  . Arthritis   . Atrial fibrillation ([place])   . Cataract    removed bilateral  . Chronic diastolic CHF (congestive heart failure) ([place]) 06/02/2020  . Clotting disorder ([place])    h/o blood clot left leg  . Dementia ([place])   . Depression   . DVT (deep venous thrombosis) ([place]) 2011  . GERD (gastroesophageal reflux disease)   . HB (heart block)    s/p PPM, most recent generator change 12/11 by JA  . Heart murmur   . Hiatal hernia   . Hyperlipidemia   . Hypertension   . Hypothyroidism   . Osteoporosis   . PTE (post-transplant erythrocytosis)   . Pulmonary embolism ([place]) 2011   on  coumadin  . UTI (lower urinary tract infection)     Past Surgical History:  Procedure Laterality Date  . BLADDER SURGERY     twice  . cataracts     bilateral  . CHOLECYSTECTOMY  09/2010  . COLONOSCOPY    . ESOPHAGOGASTRODUODENOSCOPY    . HEMORRHOID SURGERY    . hysterectomy (other)    . INNER EAR SURGERY    . JOINT REPLACEMENT    . pacemaker     most recent generator 12/11 by JA  . TOTAL KNEE ARTHROPLASTY Left     Allergies  Allergen Reactions  . Alendronate Other (See Comments)    Can not tolerate  . Celebrex [Celecoxib] Other (See Comments)    Can not tolerate  . Meloxicam Other (See Comments)    Avoid   . Myrbetriq [Mirabegron] Other (See Comments)    Can not tolerate  . Norco [Hydrocodone-Acetaminophen] Other (See Comments)    Dizziness and jerking  . Pradaxa [Dabigatran Etexilate Mesylate] Other (See Comments)    Can not tolerate  . Pradaxa [Dabigatran]   . Toviaz [Fesoterodine Fumarate Er] Other (See Comments)    Can not tolerate  . Vesicare [Solifenacin] Other (See Comments)    Can not tolerate    Outpatient Encounter Medications as of 06/29/2020  Medication Sig  . Calcium Carb-Cholecalciferol 600-800 MG-UNIT TABS Take 1 tablet by mouth daily.  [name]  cholecalciferol (VITAMIN D) 1000 UNITS tablet Take 1,000 Units by mouth daily.   [name] doxycycline (VIBRA-TABS) 100 MG tablet Take 1 tablet (100 mg total) by mouth 2 (two) times daily.  [name] gabapentin (NEURONTIN) 300 MG capsule TAKE 1 CAPSULE TWICE DAILY (Patient taking differently: Take 300 mg by mouth 2 (two) times daily.)  . levothyroxine (SYNTHROID) 50 MCG tablet TAKE 1 TABLET (50 MCG TOTAL) BY MOUTH DAILY BEFORE BREAKFAST.  [name] loratadine (CLARITIN) 10 MG tablet Take 10 mg by mouth daily.  . memantine (NAMENDA) 10 MG tablet TAKE 1 TABLET TWICE DAILY (Patient taking differently: Take 10 mg by mouth 2 (two) times daily.)  . Multiple Vitamins-Minerals (PRESERVISION AREDS 2 PO) Take 1 capsule by mouth 2 (two) times daily.   [name] NITROSTAT 0.4 MG SL tablet Place 0.4 mg under the tongue every 5 (five) minutes as needed for chest pain (MAX 3 TABLETS).   [name] omeprazole (PRILOSEC) 20 MG capsule TAKE 1 CAPSULE TWICE DAILY (Patient taking differently: Take 20 mg by mouth 2 (two) times daily before a meal.)  . potassium chloride (KLOR-CON) 20 MEQ packet Take 20 mEq by mouth daily.  . pravastatin (PRAVACHOL) 20 MG tablet TAKE 1 TABLET EVERY DAY (Patient taking differently: Take 20 mg by mouth daily.)  . sertraline (ZOLOFT) 25 MG tablet TAKE 1 TABLET IN THE MORNING, AND 2 TABLETS IN THE EVENING AS DIRECTED. TOTAL 3 TABLETS DAILY. (Patient taking differently: Take 25 mg by mouth in the morning and at bedtime.)  . torsemide (DEMADEX) 20 MG tablet Take 20 mg by mouth daily.  [name] warfarin (COUMADIN) 5 MG tablet TAKE 1 TABLET DAILY OR AS DIRECTED BY THE COUMADIN CLINIC.  . [DISCONTINUED] colestipol (COLESTID) 5 g granules Take 5 g by mouth daily with supper.   No facility-administered encounter medications on file as of 06/29/2020.    Review of Systems:  Review of Systems  Constitutional: Negative.   HENT: Negative.   Respiratory: Negative.   Cardiovascular: Positive for leg swelling.  Gastrointestinal: Positive for diarrhea.  Genitourinary: Negative.   Musculoskeletal: Positive for gait problem.  Skin: Positive for color change.  Neurological: Positive for weakness. Negative for dizziness.  Psychiatric/Behavioral: Positive for confusion.    Health Maintenance  Topic Date Due  . TETANUS/TDAP  03/10/2022  . INFLUENZA VACCINE  Completed  . COVID-19 Vaccine  Completed  . PNA vac Low Risk Adult  Completed  . DEXA SCAN  Discontinued    Physical Exam: Vitals:   06/29/20 1617  BP: 106/68  Pulse: 97  Temp: (!) 96.5 F (35.8 C)  SpO2: 96%  Weight: 135 lb 11.2 oz (61.6 kg)  Height: 5' (1.524 m)   Body mass index is 26.5 kg/m. Physical Exam Vitals reviewed.  Constitutional:      Appearance: Normal appearance.  HENT:      Head: Normocephalic.     Nose: Nose normal.     Mouth/Throat:     Mouth: Mucous membranes are moist.     Pharynx: Oropharynx is clear.  Eyes:     Pupils: Pupils are equal, round, and reactive to light.  Cardiovascular:     Rate and Rhythm: Normal rate. Rhythm irregular.     Pulses: Normal pulses.  Pulmonary:     Effort: Pulmonary effort is normal.     Breath sounds: Normal breath sounds.  Abdominal:     General: Abdomen is flat. Bowel sounds are normal.     Palpations: Abdomen is soft.  Musculoskeletal:     Cervical  back: Neck supple.     Comments: Bilateral Moderate Edema with Left Leg Red below the Knee. Tender and Warm to touch. Some small Papules near the Ankle Right leg has less redness Mostly in her back of the leg  Skin:    General: Skin is warm.  Neurological:     General: No focal deficit present.     Mental Status: She is alert.  Psychiatric:        Mood and Affect: Mood normal.        Thought Content: Thought content normal.     Labs reviewed: Basic Metabolic Panel: Recent Labs    10/15/19 0000 03/31/20 0820 04/25/20 0800 06/02/20 1900 06/04/20 0336 06/05/20 0426 06/06/20 0427  NA 141 141 142   < > 140 138 140  K 4.3 4.2 4.3   < > 3.6 4.2 4.1  CL 104 104 103   < > 107 106 108  CO2 28 32 29   < > 25 27 24   GLUCOSE 83 93 91   < > 149* 145* 153*  BUN 22 21 21    < > 28* 26* 30*  CREATININE 0.73 0.77 0.95*   < > 0.67 0.80 0.76  CALCIUM 9.7 9.7 9.6   < > 8.9 9.1 9.1  MG  --   --   --    < > 2.0 2.1 2.2  TSH 2.17 2.19 3.42  --   --   --   --    < > = values in this interval not displayed.   Liver Function Tests: Recent Labs    06/04/20 0336 06/05/20 0426 06/06/20 0427  AST 17 16 15   ALT 16 16 20   ALKPHOS 62 63 56  BILITOT 0.6 0.4 0.2*  PROT 6.1* 5.8* 5.5*  ALBUMIN 3.3* 3.1* 3.0*   No results for input(s): LIPASE, AMYLASE in the last 8760 hours. No results for input(s): AMMONIA in the last 8760 hours. CBC: Recent Labs    06/04/20 0336  06/05/20 0426 06/06/20 0427  WBC 10.3 10.6* 10.0  NEUTROABS 8.3* 9.3* 8.8*  HGB 12.1 12.0 12.2  HCT 39.2 38.5 37.9  MCV 93.6 94.1 90.9  PLT 171 169 160   Lipid Panel: Recent Labs    10/15/19 0000 06/03/20 0121  CHOL 158  --   HDL 60  --   LDLCALC 78  --   TRIG 116 76  CHOLHDL 2.6  --    No results found for: HGBA1C  Procedures since last visit: DG Chest Port 1 View  Result Date: 06/02/2020 CLINICAL DATA:  Hypoxia EXAM: PORTABLE CHEST 1 VIEW COMPARISON:  11/01/2013 FINDINGS: Left-sided pacing device as before. The right lung is grossly clear. Borderline cardiomegaly with aortic atherosclerosis. Patchy opacity left base. No pneumothorax IMPRESSION: Patchy atelectasis or infiltrate at the left base. Electronically Signed   By: Donavan Foil M.D.   On: 06/02/2020 19:05   CUP PACEART REMOTE DEVICE CHECK  Result Date: 06/02/2020 Scheduled remote reviewed. Normal device function.  Atrial arrhythmias were all less than one minute Next remote 91 days. Kathy Breach, RN, CCDS, [employer]   Assessment/Plan 1. Bilateral cellulitis of lower leg Left Leg Worse then right Doxycyline 100 mg BID for 10 days Go to ED if develop fever or Lethargy Follow up with [facility] in few days  2. Bilateral leg edema Change Torsemide back to 20 mg QD Her husband was cutting it to half dose due to frequent urination  3. Post-COVID chronic  fatigue Working with therapy  4. Cognitive impairment Failed Aricept due to sideeffecs On Namenda  5. Paroxysmal atrial fibrillation (HCC) On Coumadin  6. Chronic diarrhea Work up negative On Questran powder PRN  7. Myoclonic jerking Controlled with Zoloft and Neurontin Failed Klonipin  8. Depression, recurrent ([place]) On Zoloft    Labs/tests ordered:  * No order type specified * Next appt:  07/05/2020

## 2020-07-05 ENCOUNTER — Other Ambulatory Visit: Payer: Self-pay

## 2020-07-05 ENCOUNTER — Non-Acute Institutional Stay: Payer: Medicare Other | Admitting: Nurse Practitioner

## 2020-07-05 ENCOUNTER — Other Ambulatory Visit: Payer: Self-pay | Admitting: *Deleted

## 2020-07-05 ENCOUNTER — Encounter: Payer: Self-pay | Admitting: Nurse Practitioner

## 2020-07-05 DIAGNOSIS — E039 Hypothyroidism, unspecified: Secondary | ICD-10-CM | POA: Diagnosis not present

## 2020-07-05 DIAGNOSIS — M8000XA Age-related osteoporosis with current pathological fracture, unspecified site, initial encounter for fracture: Secondary | ICD-10-CM | POA: Diagnosis not present

## 2020-07-05 DIAGNOSIS — I442 Atrioventricular block, complete: Secondary | ICD-10-CM

## 2020-07-05 DIAGNOSIS — R4189 Other symptoms and signs involving cognitive functions and awareness: Secondary | ICD-10-CM | POA: Diagnosis not present

## 2020-07-05 DIAGNOSIS — I480 Paroxysmal atrial fibrillation: Secondary | ICD-10-CM

## 2020-07-05 DIAGNOSIS — K219 Gastro-esophageal reflux disease without esophagitis: Secondary | ICD-10-CM | POA: Diagnosis not present

## 2020-07-05 DIAGNOSIS — R197 Diarrhea, unspecified: Secondary | ICD-10-CM

## 2020-07-05 DIAGNOSIS — N3946 Mixed incontinence: Secondary | ICD-10-CM | POA: Diagnosis not present

## 2020-07-05 DIAGNOSIS — G253 Myoclonus: Secondary | ICD-10-CM | POA: Diagnosis not present

## 2020-07-05 DIAGNOSIS — F339 Major depressive disorder, recurrent, unspecified: Secondary | ICD-10-CM

## 2020-07-05 DIAGNOSIS — L03116 Cellulitis of left lower limb: Secondary | ICD-10-CM

## 2020-07-05 DIAGNOSIS — R600 Localized edema: Secondary | ICD-10-CM | POA: Diagnosis not present

## 2020-07-05 MED ORDER — DOXYCYCLINE HYCLATE 100 MG PO TABS: 100.0000 mg | ORAL_TABLET | Freq: Two times a day (BID) | ORAL | 0 refills | Status: AC

## 2020-07-05 MED ORDER — TORSEMIDE 20 MG PO TABS: 20.0000 mg | ORAL_TABLET | Freq: Every day | ORAL | 0 refills | Status: DC

## 2020-07-05 MED ORDER — MEMANTINE HCL 10 MG PO TABS
10.0000 mg | ORAL_TABLET | Freq: Two times a day (BID) | ORAL | 1 refills | Status: AC
Start: 2020-07-05 — End: ?

## 2020-07-05 NOTE — Assessment & Plan Note (Addendum)
Cellulitis left leg, persisted, redness, warmth, tenderness, on 10 day course of Doxy 100mg  bid since 06/29/20( the patient came in with empty bottle of Doxy) will call the patient's husband and pharmacy to ensure her supplies-husband said the rest of Doxycycline was placed in the pill box. Will extend Doxy for 7 more days. F/u in week.

## 2020-07-05 NOTE — Assessment & Plan Note (Signed)
taking Omeprazole. Hgb 12.2 06/06/20

## 2020-07-05 NOTE — Assessment & Plan Note (Addendum)
persisted swelling in legs, LLE >RLE slightly, Hx of DVT LLE. Saw Cardiology: encourage ankle elevation above heart, compression with ACE wraps,on Torsemide 20mg  since 06/29/20 for swelling legs( no Torsemide on the list of her home meds), not sure if the patient has taken Torsemide or not at home.  Last echoc normal EF 2016. Bun/creat 30/0.76 06/06/20. Will refill Torsemide as needed, f/u in one week.

## 2020-07-05 NOTE — Telephone Encounter (Signed)
Patient husband called requesting refill to be sent to [facility]. Faxed.

## 2020-07-05 NOTE — Assessment & Plan Note (Signed)
Age related OP w/o current pathological fracture, taking Prolia per Rheumatology

## 2020-07-05 NOTE — Assessment & Plan Note (Signed)
AS, pacemaker, Hx of DVT on Coumadin, f/u Cardiology

## 2020-07-05 NOTE — Assessment & Plan Note (Signed)
Chronic diarrhea, better, f/u GI, off Donepezil, taking Cholestyramine.

## 2020-07-05 NOTE — Assessment & Plan Note (Signed)
on Coumadin, Hgb 12.2 06/06/20

## 2020-07-05 NOTE — Progress Notes (Signed)
Location:   clinic [place] of Service:  Clinic (12) Provider: Marlana Latus NP  Code Status: DNR Goals of Care: [place]ives 06/03/2020  Does Patient Have a Medical Advance Directive? No  Type of Advance Directive -  Does patient want to make changes to medical advance directive? -  Copy of [place] in Chart? -  Would patient like information on creating a medical advance directive? No - Patient declined     Chief Complaint  Patient presents with  . Medical Management of Chronic Issues    One week follow up on legs. Patient states legs are worse, but she can't go to hospital because she is moving. She would like her her left side of face looked at. She has been having pain. Medication for her incontinence is very expensive, the briefs and pads are not helping keep her clothing protected.     HPI: Patient is a 87 y.o. female seen today for medical management of chronic diseases.      Cellulitis left leg, persisted, redness, warmth, tenderness, on 10 day course of Doxy 100mg  bid since 06/29/20. The patient came in with empty bottle of Doxy, wants refill?  Depression, takes Sertraline.   persisted swelling in legs, LLE >RLE slightly, Hx of DVT LLE. Saw Cardiology 03/21/20: encourage ankle elevation above heart, compression with ACE wraps,  persisted on Torsemide 20mg  since 06/29/20 for swelling legs(no Torsemide on the list of meds the patient brought in, not sure is she has been taking it or not). Last echoc normal EF 2016. Bun/creat 30/0.76 06/06/20             Hx of Hypothyroidism, takes Levothyroxine, TH 2.19 03/31/20  Afib, on Coumadin, Hgb 12.2 06/06/20             Myoclonic jerking functional, s/p neurology evaluation, taking Sertraline, Gabapentin. Prn Clonazepam             Age related OP w/o current pathological fracture, taking Prolia per Rheumatology             Cognitive impairment: off Donepezil, takes Memantine. Moving to [place] 07/13/20 per  patient.              Chronic diarrhea, better, f/u GI, off Donepezil, taking Cholestyramine.              Urinary incontinence, didn't tolerated Myrbetriq, declined Urology eval.              AS, pacemaker, Hx of DVT on Coumadin, f/u Cardiology             GERD, taking Omeprazole. Hgb 12.2 06/06/20   Past Medical History:  Diagnosis Date  . Arthritis   . Atrial fibrillation ([place])   . Cataract    removed bilateral  . Chronic diastolic CHF (congestive heart failure) ([place]) 06/02/2020  . Clotting disorder ([place])    h/o blood clot left leg  . Dementia ([place])   . Depression   . DVT (deep venous thrombosis) ([place]) 2011  . GERD (gastroesophageal reflux disease)   . HB (heart block)    s/p PPM, most recent generator change 12/11 by JA  . Heart murmur   . Hiatal hernia   . Hyperlipidemia   . Hypertension   . Hypothyroidism   . Osteoporosis   . PTE (post-transplant erythrocytosis)   . Pulmonary embolism ([place]) 2011   on coumadin  . UTI (lower urinary tract infection)  Past Surgical History:  Procedure Laterality Date  . BLADDER SURGERY     twice  . cataracts     bilateral  . CHOLECYSTECTOMY  09/2010  . COLONOSCOPY    . ESOPHAGOGASTRODUODENOSCOPY    . HEMORRHOID SURGERY    . hysterectomy (other)    . INNER EAR SURGERY    . JOINT REPLACEMENT    . pacemaker     most recent generator 12/11 by JA  . TOTAL KNEE ARTHROPLASTY Left     Allergies  Allergen Reactions  . Alendronate Other (See Comments)    Can not tolerate  . Celebrex [Celecoxib] Other (See Comments)    Can not tolerate  . Meloxicam Other (See Comments)    Avoid   . Myrbetriq [Mirabegron] Other (See Comments)    Can not tolerate  . Norco [Hydrocodone-Acetaminophen] Other (See Comments)    Dizziness and jerking  . Pradaxa [Dabigatran Etexilate Mesylate] Other (See Comments)    Can not tolerate  . Pradaxa [Dabigatran]   . Toviaz [Fesoterodine Fumarate Er] Other (See Comments)    Can not tolerate  . Vesicare  [Solifenacin] Other (See Comments)    Can not tolerate    Allergies as of 07/05/2020      Reactions   Alendronate Other (See Comments)   Can not tolerate   Celebrex [celecoxib] Other (See Comments)   Can not tolerate   Meloxicam Other (See Comments)   Avoid   Myrbetriq [mirabegron] Other (See Comments)   Can not tolerate   Norco [hydrocodone-acetaminophen] Other (See Comments)   Dizziness and jerking   Pradaxa [dabigatran Etexilate Mesylate] Other (See Comments)   Can not tolerate   Pradaxa [dabigatran]    Toviaz [fesoterodine Fumarate Er] Other (See Comments)   Can not tolerate   Vesicare [solifenacin] Other (See Comments)   Can not tolerate      Medication List       Accurate as of July 05, 2020 11:59 PM. If you have any questions, ask your nurse or doctor.        Calcium Carb-Cholecalciferol 600-800 MG-UNIT Tabs Take 1 tablet by mouth daily.   cholecalciferol 1000 units tablet Commonly known as: VITAMIN D Take 1,000 Units by mouth daily.   doxycycline 100 MG tablet Commonly known as: VIBRA-TABS Take 1 tablet (100 mg total) by mouth 2 (two) times daily. What changed: Another medication with the same name was added. Make sure you understand how and when to take each. Changed by: Shreena Baines X Jorgeluis Gurganus, NP   doxycycline 100 MG tablet Commonly known as: VIBRA-TABS Take 1 tablet (100 mg total) by mouth 2 (two) times daily for 7 days. What changed: You were already taking a medication with the same name, and this prescription was added. Make sure you understand how and when to take each. Changed by: Cal Gindlesperger X Kryssa Risenhoover, NP   gabapentin 300 MG capsule Commonly known as: NEURONTIN TAKE 1 CAPSULE TWICE DAILY   levothyroxine 50 MCG tablet Commonly known as: SYNTHROID TAKE 1 TABLET (50 MCG TOTAL) BY MOUTH DAILY BEFORE BREAKFAST.   loratadine 10 MG tablet Commonly known as: CLARITIN Take 10 mg by mouth daily.   memantine 10 MG tablet Commonly known as: NAMENDA Take 1 tablet (10  mg total) by mouth 2 (two) times daily.   Nitrostat 0.4 MG SL tablet Generic drug: nitroGLYCERIN Place 0.4 mg under the tongue every 5 (five) minutes as needed for chest pain (MAX 3 TABLETS).   omeprazole 20 MG capsule Commonly known as:  PRILOSEC TAKE 1 CAPSULE TWICE DAILY What changed: when to take this   potassium chloride 20 MEQ packet Commonly known as: KLOR-CON Take 20 mEq by mouth daily.   pravastatin 20 MG tablet Commonly known as: PRAVACHOL TAKE 1 TABLET EVERY DAY   PRESERVISION AREDS 2 PO Take 1 capsule by mouth 2 (two) times daily.   sertraline 25 MG tablet Commonly known as: ZOLOFT TAKE 1 TABLET IN THE MORNING, AND 2 TABLETS IN THE EVENING AS DIRECTED. TOTAL 3 TABLETS DAILY. What changed: See the new instructions.   torsemide 20 MG tablet Commonly known as: DEMADEX Take 1 tablet (20 mg total) by mouth daily.   warfarin 5 MG tablet Commonly known as: COUMADIN Take as directed by the anticoagulation clinic. If you are unsure how to take this medication, talk to your nurse or doctor. Original instructions: TAKE 1 TABLET DAILY OR AS DIRECTED BY THE COUMADIN CLINIC.       Review of Systems:  Review of Systems  Constitutional: Negative for activity change, appetite change, fever and unexpected weight change.  HENT: Positive for hearing loss. Negative for congestion, trouble swallowing and voice change.   Eyes: Negative for visual disturbance.       Low vision  Respiratory: Negative for cough, shortness of breath and wheezing.   Cardiovascular: Positive for leg swelling.  Gastrointestinal: Negative for abdominal pain, constipation and nausea.  Genitourinary: Positive for frequency and urgency. Negative for dysuria.       Incontinent of urine, frequency, urgency at times.   Musculoskeletal: Positive for gait problem.  Skin:       Mild erythema RLE. Redness, swelling, tenderness, warmth LLE  Neurological: Negative for facial asymmetry, speech difficulty,  weakness and headaches.       Memory loss  Psychiatric/Behavioral: Negative for confusion, hallucinations and sleep disturbance. The patient is not nervous/anxious.     Health Maintenance  Topic Date Due  . TETANUS/TDAP  03/10/2022  . INFLUENZA VACCINE  Completed  . COVID-19 Vaccine  Completed  . PNA vac Low Risk Adult  Completed  . DEXA SCAN  Discontinued    Physical Exam: Vitals:   07/05/20 1402  BP: 110/68  Pulse: 97  Temp: (!) 96.5 F (35.8 C)  SpO2: 92%  Weight: 134 lb 3.2 oz (60.9 kg)  Height: 5' (1.524 m)   Body mass index is 26.21 kg/m. Physical Exam Vitals reviewed.  Constitutional:      General: She is not in acute distress.    Appearance: Normal appearance. She is not ill-appearing or toxic-appearing.  HENT:     Head: Normocephalic and atraumatic.     Nose: Nose normal.     Mouth/Throat:     Mouth: Mucous membranes are moist.  Eyes:     Extraocular Movements: Extraocular movements intact.     Conjunctiva/sclera: Conjunctivae normal.     Pupils: Pupils are equal, round, and reactive to light.  Cardiovascular:     Rate and Rhythm: Normal rate and regular rhythm.     Heart sounds: Murmur heard.      Comments: Pacer. DP pulses present R+L Pulmonary:     Effort: Pulmonary effort is normal.     Breath sounds: No wheezing, rhonchi or rales.  Abdominal:     General: Bowel sounds are normal. There is no distension.     Tenderness: There is no abdominal tenderness. There is no right CVA tenderness, left CVA tenderness, guarding or rebound.  Musculoskeletal:     Cervical back: Normal range  of motion and neck supple.     Right lower leg: Edema present.     Left lower leg: Edema present.     Comments: 2-3+ edema BLE  Skin:    General: Skin is warm and dry.     Findings: Erythema present.     Comments: Mild erythema RLE. Prominent redness, warmth, tenderness, swelling in LLE   Neurological:     General: No focal deficit present.     Mental Status: She is  alert. Mental status is at baseline.     Motor: No weakness.     Coordination: Coordination normal.     Gait: Gait abnormal.     Comments: Oriented to person, place. Not sure about time.   Psychiatric:        Mood and Affect: Mood normal.        Behavior: Behavior normal.        Thought Content: Thought content normal.        Judgment: Judgment normal.     Labs reviewed: Basic Metabolic Panel: Recent Labs    10/15/19 0000 03/31/20 0820 04/25/20 0800 06/02/20 1900 06/04/20 0336 06/05/20 0426 06/06/20 0427  NA 141 141 142   < > 140 138 140  K 4.3 4.2 4.3   < > 3.6 4.2 4.1  CL 104 104 103   < > 107 106 108  CO2 28 32 29   < > 25 27 24   GLUCOSE 83 93 91   < > 149* 145* 153*  BUN 22 21 21    < > 28* 26* 30*  CREATININE 0.73 0.77 0.95*   < > 0.67 0.80 0.76  CALCIUM 9.7 9.7 9.6   < > 8.9 9.1 9.1  MG  --   --   --    < > 2.0 2.1 2.2  TSH 2.17 2.19 3.42  --   --   --   --    < > = values in this interval not displayed.   Liver Function Tests: Recent Labs    06/04/20 0336 06/05/20 0426 06/06/20 0427  AST 17 16 15   ALT 16 16 20   ALKPHOS 62 63 56  BILITOT 0.6 0.4 0.2*  PROT 6.1* 5.8* 5.5*  ALBUMIN 3.3* 3.1* 3.0*   No results for input(s): LIPASE, AMYLASE in the last 8760 hours. No results for input(s): AMMONIA in the last 8760 hours. CBC: Recent Labs    06/04/20 0336 06/05/20 0426 06/06/20 0427  WBC 10.3 10.6* 10.0  NEUTROABS 8.3* 9.3* 8.8*  HGB 12.1 12.0 12.2  HCT 39.2 38.5 37.9  MCV 93.6 94.1 90.9  PLT 171 169 160   Lipid Panel: Recent Labs    10/15/19 0000 06/03/20 0121  CHOL 158  --   HDL 60  --   LDLCALC 78  --   TRIG 116 76  CHOLHDL 2.6  --    No results found for: HGBA1C  Procedures since last visit: No results found.  Assessment/Plan  Cellulitis of left leg without foot Cellulitis left leg, persisted, redness, warmth, tenderness, on 10 day course of Doxy 100mg  bid since 06/29/20( the patient came in with empty bottle of Doxy) will call the  patient's husband and pharmacy to ensure her supplies-husband said the rest of Doxycycline was placed in the pill box. Will extend Doxy for 7 more days. F/u in week.    Depression, recurrent ([place]) Her mood is stable, continue Sertraline.   Edema of both legs persisted swelling in legs, LLE >  RLE slightly, Hx of DVT LLE. Saw Cardiology: encourage ankle elevation above heart, compression with ACE wraps,on Torsemide 20mg  since 06/29/20 for swelling legs( no Torsemide on the list of her home meds), not sure if the patient has taken Torsemide or not at home.  Last echoc normal EF 2016. Bun/creat 30/0.76 06/06/20. Will refill Torsemide as needed, f/u in one week.    Hypothyroidism in adult  takes Levothyroxine, TH 2.19 03/31/20   PAROXYSMAL ATRIAL FIBRILLATION  on Coumadin, Hgb 12.2 06/06/20   Myoclonic jerking Myoclonic jerking functional, s/p neurology evaluation, taking Sertraline, Gabapentin. Prn Clonazepam   Osteoporosis Age related OP w/o current pathological fracture, taking Prolia per Rheumatology  Cognitive impairment Cognitive impairment: off Donepezil, takes Memantine. Moving to [place] 07/13/20 per patient.    Diarrhea in adult patient Chronic diarrhea, better, f/u GI, off Donepezil, taking Cholestyramine.   Mixed incontinence urge and stress didn't tolerated Myrbetriq, declined Urology eval.  ATRIOVENTRICULAR BLOCK, 3RD DEGREE AS, pacemaker, Hx of DVT on Coumadin, f/u Cardiology   GERD (gastroesophageal reflux disease) taking Omeprazole. Hgb 12.2 06/06/20    Labs/tests ordered:  None  Next appt:  09/07/2020

## 2020-07-05 NOTE — Assessment & Plan Note (Signed)
Myoclonic jerking functional, s/p neurology evaluation, taking Sertraline, Gabapentin. Prn Clonazepam

## 2020-07-05 NOTE — Assessment & Plan Note (Signed)
Cognitive impairment: off Donepezil, takes Memantine. Moving to [place] 07/13/20 per patient.

## 2020-07-05 NOTE — Assessment & Plan Note (Signed)
didn't tolerated Myrbetriq, declined Urology eval.

## 2020-07-05 NOTE — Assessment & Plan Note (Signed)
Her mood is stable, continue Sertraline.  

## 2020-07-05 NOTE — Assessment & Plan Note (Signed)
takes Levothyroxine, TH 2.19 03/31/20

## 2020-07-06 ENCOUNTER — Encounter: Payer: Self-pay | Admitting: Nurse Practitioner

## 2020-07-07 ENCOUNTER — Other Ambulatory Visit: Payer: Self-pay | Admitting: Internal Medicine

## 2020-07-07 ENCOUNTER — Ambulatory Visit (INDEPENDENT_AMBULATORY_CARE_PROVIDER_SITE_OTHER): Payer: Medicare Other | Admitting: *Deleted

## 2020-07-07 DIAGNOSIS — Z5181 Encounter for therapeutic drug level monitoring: Secondary | ICD-10-CM

## 2020-07-07 DIAGNOSIS — I4891 Unspecified atrial fibrillation: Secondary | ICD-10-CM | POA: Diagnosis not present

## 2020-07-07 LAB — PROTIME-INR
INR: 2.1 — AB (ref 0.9–1.1)
INR: 2.1 — ABNORMAL HIGH
Prothrombin Time: 20.3 s — ABNORMAL HIGH (ref 9.0–11.5)

## 2020-07-07 NOTE — Patient Instructions (Signed)
Description   Called spoke with pt's husband, advised to have pt to have pt take 1.5 tablets today since she missed Tuesday's dose then continue taking 1 tablet daily except 1/2 tablet on Sunday. Recheck in 2 weeks. Orders faxed to [facility] [phone].

## 2020-07-13 ENCOUNTER — Other Ambulatory Visit: Payer: Self-pay | Admitting: Internal Medicine

## 2020-07-13 ENCOUNTER — Encounter: Payer: Medicare Other | Admitting: Internal Medicine

## 2020-07-13 ENCOUNTER — Other Ambulatory Visit: Payer: Self-pay

## 2020-07-14 ENCOUNTER — Encounter: Payer: Self-pay | Admitting: Internal Medicine

## 2020-07-14 ENCOUNTER — Non-Acute Institutional Stay: Payer: Medicare Other | Admitting: Internal Medicine

## 2020-07-14 DIAGNOSIS — K219 Gastro-esophageal reflux disease without esophagitis: Secondary | ICD-10-CM | POA: Diagnosis not present

## 2020-07-14 DIAGNOSIS — U099 Post covid-19 condition, unspecified: Secondary | ICD-10-CM | POA: Diagnosis not present

## 2020-07-14 DIAGNOSIS — I480 Paroxysmal atrial fibrillation: Secondary | ICD-10-CM | POA: Diagnosis not present

## 2020-07-14 DIAGNOSIS — E039 Hypothyroidism, unspecified: Secondary | ICD-10-CM

## 2020-07-14 DIAGNOSIS — R293 Abnormal posture: Secondary | ICD-10-CM | POA: Diagnosis not present

## 2020-07-14 DIAGNOSIS — R2681 Unsteadiness on feet: Secondary | ICD-10-CM | POA: Diagnosis not present

## 2020-07-14 DIAGNOSIS — R600 Localized edema: Secondary | ICD-10-CM | POA: Diagnosis not present

## 2020-07-14 DIAGNOSIS — F339 Major depressive disorder, recurrent, unspecified: Secondary | ICD-10-CM | POA: Diagnosis not present

## 2020-07-14 DIAGNOSIS — N3946 Mixed incontinence: Secondary | ICD-10-CM

## 2020-07-14 DIAGNOSIS — R5382 Chronic fatigue, unspecified: Secondary | ICD-10-CM | POA: Diagnosis not present

## 2020-07-14 DIAGNOSIS — M6281 Muscle weakness (generalized): Secondary | ICD-10-CM | POA: Diagnosis not present

## 2020-07-14 DIAGNOSIS — G253 Myoclonus: Secondary | ICD-10-CM | POA: Diagnosis not present

## 2020-07-14 DIAGNOSIS — R197 Diarrhea, unspecified: Secondary | ICD-10-CM | POA: Diagnosis not present

## 2020-07-14 DIAGNOSIS — R4189 Other symptoms and signs involving cognitive functions and awareness: Secondary | ICD-10-CM

## 2020-07-14 DIAGNOSIS — M8000XA Age-related osteoporosis with current pathological fracture, unspecified site, initial encounter for fracture: Secondary | ICD-10-CM

## 2020-07-14 DIAGNOSIS — R41841 Cognitive communication deficit: Secondary | ICD-10-CM | POA: Diagnosis not present

## 2020-07-14 DIAGNOSIS — G9332 Myalgic encephalomyelitis/chronic fatigue syndrome: Secondary | ICD-10-CM

## 2020-07-14 DIAGNOSIS — R260 Ataxic gait: Secondary | ICD-10-CM | POA: Diagnosis not present

## 2020-07-14 NOTE — Progress Notes (Signed)
Location:    [place] Room Number: 38 Place of Service:  ALF [phone]) Provider: Veleta Miners MD    Nicole Dad, MD  Patient Care Team: Nicole Dad, MD as PCP - General (Internal Medicine) Nicole Booze, MD as PCP - Cardiology (Cardiology) Nicole Media, MD as Consulting Physician (Neurology) Nicole Grayer, MD as Consulting Physician (Cardiology)  Extended Emergency Contact Information Primary Emergency Contact: [name] Address: [place], [place] Phone: [phone] Relation: Spouse Secondary Emergency Contact: [name] Address: [address]          [place], [place] [address] [name] of [place] Phone: [phone] Relation: Daughter  Code Status:   Goals of care: Advanced Directive information Advanced Directives 06/03/2020  Does Patient Have a Medical Advance Directive? No  Type of Advance Directive -  Does patient want to make changes to medical advance directive? -  Copy of [place] in Chart? -  Would patient like information on creating a medical advance directive? No - Patient declined     Chief Complaint  Patient presents with  . Medical Management of Chronic Issues    HPI:  Pt is a 87 y.o. female seen today for New Admit to AL     Patient has a history of CHB s/p pacemaker,  PAF on Coumadin, moderate AS Also has history of cognitive impairment, myoclonic jerks thought to be functional on Zoloft Osteoporosis on Prolia per rheumatology Urinary incontinence Chronic diarrheafollowedby GI it now on cholestyraminePRN Was admitted in the hospital from 01/20- 01/24 for Covid Pneumonia  Is now admitted to [facility] Unable to take care of herself in [place] Apart[place]  Her Acute issue LE edema and Redness Got 2 weeks of Doxycyline as outpatient Also given Demadex Redness is better but continues. Edema is slightly better No  Fever or chills Does have SOB on exertion. No other issues today Walking with [name]. Weakness seen after Covid is  is better   Past Medical History:  Diagnosis Date  . Arthritis   . Atrial fibrillation ([place])   . Cataract    removed bilateral  . Chronic diastolic CHF (congestive heart failure) ([place]) 06/02/2020  . Clotting disorder ([place])    h/o blood clot left leg  . Dementia ([place])   . Depression   . DVT (deep venous thrombosis) ([place]) 2011  . GERD (gastroesophageal reflux disease)   . HB (heart block)    s/p PPM, most recent generator change 12/11 by JA  . Heart murmur   . Hiatal hernia   . Hyperlipidemia   . Hypertension   . Hypothyroidism   . Osteoporosis   . PTE (post-transplant erythrocytosis)   . Pulmonary embolism ([place]) 2011   on coumadin  . UTI (lower urinary tract infection)    Past Surgical History:  Procedure Laterality Date  . BLADDER SURGERY     twice  . cataracts     bilateral  . CHOLECYSTECTOMY  09/2010  . COLONOSCOPY    . ESOPHAGOGASTRODUODENOSCOPY    . HEMORRHOID SURGERY    . hysterectomy (other)    . INNER EAR SURGERY    . JOINT REPLACEMENT    . pacemaker     most recent generator 12/11 by JA  . TOTAL KNEE ARTHROPLASTY Left     Allergies  Allergen Reactions  . Alendronate Other (See Comments)    Can  not tolerate  . Celebrex [Celecoxib] Other (See Comments)    Can not tolerate  . Meloxicam Other (See Comments)    Avoid   . Myrbetriq [Mirabegron] Other (See Comments)    Can not tolerate  . Norco [Hydrocodone-Acetaminophen] Other (See Comments)    Dizziness and jerking  . Pradaxa [Dabigatran Etexilate Mesylate] Other (See Comments)    Can not tolerate  . Pradaxa [Dabigatran]   . Toviaz [Fesoterodine Fumarate Er] Other (See Comments)    Can not tolerate  . Vesicare [Solifenacin] Other (See Comments)    Can not tolerate    Allergies as of 07/14/2020      Reactions   Alendronate Other (See Comments)   Can not tolerate   Celebrex  [celecoxib] Other (See Comments)   Can not tolerate   Meloxicam Other (See Comments)   Avoid   Myrbetriq [mirabegron] Other (See Comments)   Can not tolerate   Norco [hydrocodone-acetaminophen] Other (See Comments)   Dizziness and jerking   Pradaxa [dabigatran Etexilate Mesylate] Other (See Comments)   Can not tolerate   Pradaxa [dabigatran]    Toviaz [fesoterodine Fumarate Er] Other (See Comments)   Can not tolerate   Vesicare [solifenacin] Other (See Comments)   Can not tolerate      Medication List       Accurate as of July 14, 2020  3:16 PM. If you have any questions, ask your nurse or doctor.        STOP taking these medications   doxycycline 100 MG tablet Commonly known as: VIBRA-TABS Stopped by: Nicole Dad, MD   Nitrostat 0.4 MG SL tablet Generic drug: nitroGLYCERIN Stopped by: Nicole Dad, MD   PRESERVISION AREDS 2 PO Stopped by: Nicole Dad, MD     TAKE these medications   Calcium Carb-Cholecalciferol 600-800 MG-UNIT Tabs Take 1 tablet by mouth daily.   cholecalciferol 1000 units tablet Commonly known as: VITAMIN D Take 1,000 Units by mouth daily.   denosumab 60 MG/ML Sosy injection Commonly known as: PROLIA Inject 60 mg into the skin. Once A Day on the 1st of Every 6th Month   gabapentin 100 MG capsule Commonly known as: NEURONTIN Take 100 mg by mouth as needed.   gabapentin 300 MG capsule Commonly known as: NEURONTIN TAKE 1 CAPSULE TWICE DAILY   levothyroxine 50 MCG tablet Commonly known as: SYNTHROID TAKE 1 TABLET (50 MCG TOTAL) BY MOUTH DAILY BEFORE BREAKFAST.   loratadine 10 MG tablet Commonly known as: CLARITIN Take 10 mg by mouth daily.   memantine 10 MG tablet Commonly known as: NAMENDA Take 1 tablet (10 mg total) by mouth 2 (two) times daily.   omeprazole 20 MG capsule Commonly known as: PRILOSEC TAKE 1 CAPSULE TWICE DAILY What changed: when to take this   potassium chloride 20 MEQ packet Commonly known as:  KLOR-CON Take 20 mEq by mouth daily.   pravastatin 20 MG tablet Commonly known as: PRAVACHOL TAKE 1 TABLET EVERY DAY   sertraline 25 MG tablet Commonly known as: ZOLOFT Take 25 mg by mouth every morning. What changed: Another medication with the same name was removed. Continue taking this medication, and follow the directions you see here. Changed by: Nicole Dad, MD   sertraline 50 MG tablet Commonly known as: ZOLOFT Take 50 mg by mouth daily. What changed: Another medication with the same name was removed. Continue taking this medication, and follow the directions you see here. Changed by: Nicole Dad, MD  torsemide 20 MG tablet Commonly known as: DEMADEX Take 1 tablet (20 mg total) by mouth daily.   Tubersol 5 UNIT/0.1ML injection Generic drug: tuberculin Inject 5 Units into the skin. Once A Day Every 14 Days   warfarin 2.5 MG tablet Commonly known as: COUMADIN Take as directed by the anticoagulation clinic. If you are unsure how to take this medication, talk to your nurse or doctor. Original instructions: Take 2.5 mg by mouth. Once A Day on Sun What changed: Another medication with the same name was removed. Continue taking this medication, and follow the directions you see here. Changed by: Nicole Dad, MD   warfarin 5 MG tablet Commonly known as: COUMADIN Take as directed by the anticoagulation clinic. If you are unsure how to take this medication, talk to your nurse or doctor. Original instructions: Take 5 mg by mouth. Once A Day on Mon, Tue, Wed, Thu, Fri, Sat What changed: Another medication with the same name was removed. Continue taking this medication, and follow the directions you see here. Changed by: Nicole Dad, MD       Review of Systems  Constitutional: Negative.   HENT: Negative.   Respiratory: Negative.   Cardiovascular: Positive for leg swelling.  Gastrointestinal: Positive for diarrhea.  Genitourinary: Negative.   Musculoskeletal:  Positive for arthralgias and myalgias.  Skin: Positive for color change.  Neurological: Negative.   Psychiatric/Behavioral: Positive for behavioral problems and confusion.    Immunization History  Administered Date(s) Administered  . IPV 03/06/2016, 09/27/2016, 10/09/2017, 04/16/2018, 11/03/2018, 07/14/2019, 01/19/2020  . Influenza Whole 01/17/2011  . Influenza, High Dose Seasonal PF 02/24/2014, 01/05/2015, 01/09/2016, 01/10/2017, 02/25/2019  . Influenza,inj,Quad PF,6+ Mos 02/13/2018  . Influenza-Unspecified 02/11/2012, 02/14/2013, 02/24/2014, 02/02/2020  . Moderna Sars-Covid-2 Vaccination 05/18/2019, 06/15/2019, 03/28/2020  . Pneumococcal Conjugate-13 06/30/2014, 03/22/2018  . Pneumococcal Polysaccharide-23 02/17/2009, 01/09/2012  . Pneumococcal-Unspecified 06/30/2008, 02/17/2009  . Td 03/10/2012  . Tdap 02/12/2012  . Zoster 05/14/2008   Pertinent  Health Maintenance Due  Topic Date Due  . INFLUENZA VACCINE  Completed  . PNA vac Low Risk Adult  Completed  . DEXA SCAN  Discontinued   Fall Risk  07/05/2020 06/29/2020 06/08/2020 05/25/2020 04/20/2020  Falls in the past year? 0 0 0 0 0  Number falls in past yr: 0 0 0 0 0  Injury with Fall? - - - - -   Functional Status Survey:    Vitals:   07/14/20 1457  BP: 132/78  Pulse: 62  Resp: 16  Temp: (!) 97.3 F (36.3 C)  SpO2: 96%  Weight: 136 lb 8 oz (61.9 kg)  Height: 5' (1.524 m)   Body mass index is 26.66 kg/m. Physical Exam Vitals reviewed.  Constitutional:      Appearance: Normal appearance.  HENT:     Head: Normocephalic.     Nose: Nose normal.     Mouth/Throat:     Mouth: Mucous membranes are moist.     Pharynx: Oropharynx is clear.  Eyes:     Pupils: Pupils are equal, round, and reactive to light.  Cardiovascular:     Rate and Rhythm: Normal rate.     Heart sounds: Murmur heard.    Pulmonary:     Effort: Pulmonary effort is normal.     Breath sounds: Normal breath sounds.  Abdominal:     General: Abdomen  is flat. Bowel sounds are normal.     Palpations: Abdomen is soft.  Musculoskeletal:     Cervical back: Neck supple.  Comments: Moderate Edema Bilateral with Redness but not warm. It is tender  Skin:    General: Skin is warm and dry.  Neurological:     General: No focal deficit present.     Mental Status: She is alert.  Psychiatric:        Mood and Affect: Mood normal.     Labs reviewed: Recent Labs    06/04/20 0336 06/05/20 0426 06/06/20 0427  NA 140 138 140  K 3.6 4.2 4.1  CL 107 106 108  CO2 25 27 24   GLUCOSE 149* 145* 153*  BUN 28* 26* 30*  CREATININE 0.67 0.80 0.76  CALCIUM 8.9 9.1 9.1  MG 2.0 2.1 2.2   Recent Labs    06/04/20 0336 06/05/20 0426 06/06/20 0427  AST 17 16 15   ALT 16 16 20   ALKPHOS 62 63 56  BILITOT 0.6 0.4 0.2*  PROT 6.1* 5.8* 5.5*  ALBUMIN 3.3* 3.1* 3.0*   Recent Labs    06/04/20 0336 06/05/20 0426 06/06/20 0427  WBC 10.3 10.6* 10.0  NEUTROABS 8.3* 9.3* 8.8*  HGB 12.1 12.0 12.2  HCT 39.2 38.5 37.9  MCV 93.6 94.1 90.9  PLT 171 169 160   Lab Results  Component Value Date   TSH 3.42 04/25/2020   No results found for: HGBA1C Lab Results  Component Value Date   CHOL 158 10/15/2019   HDL 60 10/15/2019   LDLCALC 78 10/15/2019   TRIG 76 06/03/2020   CHOLHDL 2.6 10/15/2019    Significant Diagnostic Results in last 30 days:  No results found.  Assessment/Plan  Edema of both legs Got Antibiotics for 2 weeks Will now treat Edema with Diuretics Increase Demadex to 40 mg QD Also Increase Potassium Add Metolazone 2.5 mg 2 doses Mon and fri Repeat BMP in 1 week  Paroxysmal atrial fibrillation (HCC) On Coumadin INR done here was 5 Repeat in 2 days is 2 Will restart her Coumadin Repeat INR in 2 week Myoclonic jerking On Zoloft and Neurontin Depression, recurrent ([place]) Continue Zoloft Taper has failed Hypothyroidism in adult TSH Normal in 12/21 Age-related osteoporosis with current pathological fracture, initial  encounter On Prolia per Rheumatology clinic Cognitive impairment Now iN AL On Namenda Failed Aricept Diarrhea in adult patient Follwed with GI Aricept was discontinued Mostly fucntional Will follow here   Gastroesophageal reflux disease, unspecified whether esophagitis present On Prilosec Post-COVID chronic fatigue THerapy   Family/ staff Communication:   Labs/tests ordered:

## 2020-07-19 ENCOUNTER — Encounter: Payer: Self-pay | Admitting: Internal Medicine

## 2020-07-19 ENCOUNTER — Ambulatory Visit (INDEPENDENT_AMBULATORY_CARE_PROVIDER_SITE_OTHER): Payer: Medicare Other | Admitting: Internal Medicine

## 2020-07-19 ENCOUNTER — Other Ambulatory Visit: Payer: Self-pay

## 2020-07-19 VITALS — BP 104/50 | HR 96 | Ht 60.0 in | Wt 133.0 lb

## 2020-07-19 DIAGNOSIS — K6289 Other specified diseases of anus and rectum: Secondary | ICD-10-CM

## 2020-07-19 MED ORDER — LIDOCAINE (ANORECTAL) 5 % EX CREA: TOPICAL_CREAM | CUTANEOUS | 11 refills | Status: DC

## 2020-07-19 NOTE — Patient Instructions (Addendum)
The rectum looks ok - not sure why it is painful.  Will try 5% lidocaine cream placed into rectum twice a day and 2 more times as needed.  This can be purchased OTC - it is called Recticare and there are store brands also  Follow-up as needed.  I appreciate the opportunity to care for you. Silvano Rusk, MD, [facility]

## 2020-07-19 NOTE — Progress Notes (Signed)
[name] 87 y.o. [birth year] [id number]  Assessment & Plan:   Encounter Diagnosis  Name Primary?   Rectal pain Yes    I am not 100% sure what is causing this.  I did not find any significant abnormalities on exam.  It could be neuropathic.  We will try 5% lidocaine rectal treatment twice daily and plus twice daily as needed  Follow-up as needed  Medications were clarified and confirmed to be correct, assisted living facility sent [place] the list.   I appreciate the opportunity to care for this patient. CC: Nicole Dad, MD  Subjective:   Chief Complaint: Rectal soreness and burning  HPI [name] is here for follow-up she has suffered a lot with diarrhea and incontinence of feces with fecal urgency.  That apparently has cleared up.  She had been on colestipol but may not be on it anymore.  No diarrhea or constipation now she just says it sore and burning "up there" and she means inside her rectum.  She has just been moved to the [facility] living section at  [facility] along with her husband.  She is here with her son today. She does sit quite a bit but is about to start some physical therapy it sounds like.  She has a lot of low back and leg pain as well.  I do not think she has tried any treatment for the symptoms now. Allergies  Allergen Reactions   Alendronate Other (See Comments)    Can not tolerate   Celebrex [Celecoxib] Other (See Comments)    Can not tolerate   Meloxicam Other (See Comments)    Avoid    Myrbetriq [Mirabegron] Other (See Comments)    Can not tolerate   Norco [Hydrocodone-Acetaminophen] Other (See Comments)    Dizziness and jerking   Pradaxa [Dabigatran Etexilate Mesylate] Other (See Comments)    Can not tolerate   Pradaxa [Dabigatran]    Toviaz [Fesoterodine Fumarate Er] Other (See Comments)    Can not tolerate   Vesicare [Solifenacin] Other (See Comments)    Can not tolerate   Current Meds  Medication Sig    Calcium Carb-Cholecalciferol 600-800 MG-UNIT TABS Take 1 tablet by mouth daily.   cholecalciferol (VITAMIN D) 1000 UNITS tablet Take 1,000 Units by mouth daily.    denosumab (PROLIA) 60 MG/ML SOSY injection Inject 60 mg into the skin. Once A Day on the 1st of Every 6th Month   gabapentin (NEURONTIN) 100 MG capsule Take 100 mg by mouth as needed.   gabapentin (NEURONTIN) 300 MG capsule TAKE 1 CAPSULE TWICE DAILY   levothyroxine (SYNTHROID) 50 MCG tablet TAKE 1 TABLET (50 MCG TOTAL) BY MOUTH DAILY BEFORE BREAKFAST.   loratadine (CLARITIN) 10 MG tablet Take 10 mg by mouth daily.   memantine (NAMENDA) 10 MG tablet Take 1 tablet (10 mg total) by mouth 2 (two) times daily.   metolazone (ZAROXOLYN) 2.5 MG tablet Take 2.5 mg by mouth 2 (two) times a week. Only 2 doses for now   omeprazole (PRILOSEC) 20 MG capsule TAKE 1 CAPSULE TWICE DAILY (Patient taking differently: Take 20 mg by mouth 2 (two) times daily before a meal.)   potassium chloride (KLOR-CON) 20 MEQ packet Take 20 mEq by mouth 2 (two) times daily.   pravastatin (PRAVACHOL) 20 MG tablet TAKE 1 TABLET EVERY DAY   sertraline (ZOLOFT) 25 MG tablet Take 25 mg by mouth every morning.   sertraline (ZOLOFT) 50 MG tablet Take 50 mg by  mouth daily.   torsemide (DEMADEX) 20 MG tablet Take 1 tablet (20 mg total) by mouth daily. (Patient taking differently: Take 40 mg by mouth daily.)   tuberculin (TUBERSOL) 5 UNIT/0.1ML injection Inject 5 Units into the skin. Once A Day Every 14 Days   warfarin (COUMADIN) 2.5 MG tablet Take 2.5 mg by mouth. Once A Day on Sun   warfarin (COUMADIN) 5 MG tablet Take 5 mg by mouth. Once A Day on Mon, Tue, Wed, Thu, Fri, Sat   Past Medical History:  Diagnosis Date   Arthritis    Atrial fibrillation [facility])    Cataract    removed bilateral   Chronic diastolic CHF (congestive heart failure) ([place]) 06/02/2020   Clotting disorder ([place])    h/o blood clot left leg   Dementia ([place])    Depression    DVT  (deep venous thrombosis) ([place]) 2011   GERD (gastroesophageal reflux disease)    HB (heart block)    s/p PPM, most recent generator change 12/11 by JA   Heart murmur    Hiatal hernia    Hyperlipidemia    Hypertension    Hypothyroidism    Osteoporosis    PTE (post-transplant erythrocytosis)    Pulmonary embolism ([place]) 2011   on coumadin   UTI (lower urinary tract infection)    Past Surgical History:  Procedure Laterality Date   BLADDER SURGERY     twice   cataracts     bilateral   CHOLECYSTECTOMY  09/2010   COLONOSCOPY     ESOPHAGOGASTRODUODENOSCOPY     HEMORRHOID SURGERY     hysterectomy (other)     INNER EAR SURGERY     JOINT REPLACEMENT     pacemaker     most recent generator 12/11 by [place] Left    Social History   Social History Narrative   Tobacco use, amount per day now: 0      Past tobacco use, amount per day: 0      How many years did you use tobacco: 0      Alcohol use (drinks per week): 0      Diet:      Do you drink/eat things with caffeine? Yes      Marital status: Married            What year were you married? 1946      Do you live in a house, apartment, assisted living, condo, trailer? Apartment       Is it one or more stories? 1      How many persons live in your home? 2      Do you have any pets in your home? 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 Yes            How often? 3-4 times a week       Do you have a living will? Yes      Do you have a DNR form?            If not, do you want to discuss one?      Do you have signed POA/HPOA forms? Yes             family history includes Arthritis in her father and mother; Breast cancer in her sister; Heart disease in her father and mother; Liver cancer in her brother; Stomach cancer in  her brother and brother; Stroke in her father.   Review of Systems As above she is more infirmed and using a walker more but is  about to start physical therapy  Objective:   Physical Exam BP (!) 104/50    Pulse 96    Ht 5' (1.524 m)    Wt 133 lb (60.3 kg)    BMI 25.97 kg/m  Elderly white woman pleasantly demented but follows commands [name], CMA present for exam, rectal exam shows some slight erythema but it does not look like a rash in the perineal area.  Looks more like redness from sitting there is no skin breakdown.  Digital exam is really not tender without mass there is formed stool but no impaction.     Anoscopy is performed and shows slightly inflamed internal and external hemorrhoids but these are minimal.

## 2020-07-20 DIAGNOSIS — M810 Age-related osteoporosis without current pathological fracture: Secondary | ICD-10-CM | POA: Diagnosis not present

## 2020-07-21 DIAGNOSIS — I10 Essential (primary) hypertension: Secondary | ICD-10-CM | POA: Diagnosis not present

## 2020-07-21 LAB — BASIC METABOLIC PANEL
BUN: 30 — AB (ref 4–21)
CO2: 35 — AB (ref 13–22)
Chloride: 96 — AB (ref 99–108)
Creatinine: 1.1 (ref 0.5–1.1)
Glucose: 97
Potassium: 3.5 (ref 3.4–5.3)
Sodium: 144 (ref 137–147)

## 2020-07-21 LAB — COMPREHENSIVE METABOLIC PANEL: Calcium: 9.8 (ref 8.7–10.7)

## 2020-07-22 ENCOUNTER — Non-Acute Institutional Stay: Payer: Medicare Other | Admitting: Nurse Practitioner

## 2020-07-22 ENCOUNTER — Telehealth: Payer: Self-pay

## 2020-07-22 ENCOUNTER — Encounter: Payer: Self-pay | Admitting: Nurse Practitioner

## 2020-07-22 DIAGNOSIS — I480 Paroxysmal atrial fibrillation: Secondary | ICD-10-CM

## 2020-07-22 DIAGNOSIS — R4189 Other symptoms and signs involving cognitive functions and awareness: Secondary | ICD-10-CM | POA: Diagnosis not present

## 2020-07-22 DIAGNOSIS — E039 Hypothyroidism, unspecified: Secondary | ICD-10-CM | POA: Diagnosis not present

## 2020-07-22 DIAGNOSIS — I5032 Chronic diastolic (congestive) heart failure: Secondary | ICD-10-CM

## 2020-07-22 DIAGNOSIS — R197 Diarrhea, unspecified: Secondary | ICD-10-CM | POA: Diagnosis not present

## 2020-07-22 DIAGNOSIS — N3946 Mixed incontinence: Secondary | ICD-10-CM | POA: Diagnosis not present

## 2020-07-22 DIAGNOSIS — M8000XA Age-related osteoporosis with current pathological fracture, unspecified site, initial encounter for fracture: Secondary | ICD-10-CM

## 2020-07-22 DIAGNOSIS — F339 Major depressive disorder, recurrent, unspecified: Secondary | ICD-10-CM | POA: Diagnosis not present

## 2020-07-22 DIAGNOSIS — K219 Gastro-esophageal reflux disease without esophagitis: Secondary | ICD-10-CM | POA: Diagnosis not present

## 2020-07-22 DIAGNOSIS — G253 Myoclonus: Secondary | ICD-10-CM

## 2020-07-22 DIAGNOSIS — I442 Atrioventricular block, complete: Secondary | ICD-10-CM | POA: Diagnosis not present

## 2020-07-22 NOTE — Assessment & Plan Note (Signed)
reported weeping legs, no weeping upon my examination today a small open area anterior right shin is covered with steri strips, no s/s of infection or drainage.   Chronic swelling in legs, LLE >RLE slightly, Hx of DVT LLE. Saw Cardiology. better on Torsemide and Metolazone. Last echoc normal EF 2016. Bun/creat 30/0.76 06/06/20

## 2020-07-22 NOTE — Assessment & Plan Note (Signed)
GERD, taking Omeprazole. Hgb 12.2 06/06/20

## 2020-07-22 NOTE — Assessment & Plan Note (Signed)
Age related OP w/o current pathological fracture, taking Prolia per Rheumatology 

## 2020-07-22 NOTE — Assessment & Plan Note (Signed)
Urinary incontinence, didn't tolerated Myrbetriq, declined Urology eval. 

## 2020-07-22 NOTE — Assessment & Plan Note (Signed)
Myoclonic jerking functional, s/p neurology evaluation, taking Sertraline, Gabapentin. Prn Clonazepam

## 2020-07-22 NOTE — Assessment & Plan Note (Signed)
AS, pacemaker, Hx of DVT on Coumadin, f/u Cardiology

## 2020-07-22 NOTE — Assessment & Plan Note (Signed)
Afib, on Coumadin, Hgb 12.2 06/06/20

## 2020-07-22 NOTE — Assessment & Plan Note (Signed)
Cognitive impairment: off Donepezil, takes Memantine. resides in [facility]. 

## 2020-07-22 NOTE — Assessment & Plan Note (Signed)
Chronic diarrhea, better, f/u GI, off Donepezil, taking Cholestyramine.

## 2020-07-22 NOTE — Assessment & Plan Note (Signed)
Depression, takes Sertraline.

## 2020-07-22 NOTE — Telephone Encounter (Signed)
Pt Moved from independent living to assisted living at [place] spoke with Lenna Sciara RN at [facility] they are now managing and monitoring pt's INR and dosing her Warfarin at their facility.

## 2020-07-22 NOTE — Progress Notes (Signed)
Location:    [facility] Room Number: 38 Place of Service:  ALF [phone]) Provider: Marlana Latus NP  [name], MD  Patient Care Team: [name], MD as PCP - General (Internal Medicine) Jettie Booze, MD as PCP - Cardiology (Cardiology) Blanch Media, MD as Consulting Physician (Neurology) Thompson Grayer, MD as Consulting Physician (Cardiology)  Extended Emergency Contact Information Primary Emergency Contact: Margaree Mackintosh Address: [place], [place] Phone: [phone] Relation: Spouse Secondary Emergency Contact: [name] Address: [address]          [place], [place] [address] [name] of [place] Phone: [phone] Relation: Daughter  Code Status:  DNR Goals of care: Advanced Directive information Advanced Directives 06/03/2020  Does Patient Have a Medical Advance Directive? No  Type of Advance Directive -  Does patient want to make changes to medical advance directive? -  Copy of [place] in Chart? -  Would patient like information on creating a medical advance directive? No - Patient declined     Chief Complaint  Patient presents with  . Acute Visit    Weeping legs    HPI:  Pt is a 87 y.o. female seen today for an acute visit for reported weeping legs, no weeping upon my examination today a small open area anterior right shin is covered with steri strips, no s/s of infection or drainage.              Depression, takes Sertraline.              Chronic swelling in legs, LLE >RLE slightly, Hx of DVT LLE. Saw Cardiology. better on Torsemide and Metolazone. Last echoc normal EF 2016. Bun/creat 30/0.76 06/06/20 Hx of Hypothyroidism, takes Levothyroxine, TH 2.19 03/31/20             Afib, on Coumadin, Hgb 12.2 06/06/20 Myoclonic jerking functional, s/p neurology evaluation, taking Sertraline, Gabapentin. Prn  Clonazepam Age related OP w/o current pathological fracture, taking Prolia per Rheumatology Cognitive impairment: off Donepezil, takes Memantine. resides in [place]. Chronic diarrhea, better, f/u GI, off Donepezil, taking Cholestyramine.  Urinary incontinence, didn't tolerated Myrbetriq, declined Urology eval.  AS, pacemaker, Hx of DVT on Coumadin, f/u Cardiology GERD, taking Omeprazole. Hgb 12.2 06/06/20  Past Medical History:  Diagnosis Date  . Arthritis   . Atrial fibrillation ([place])   . Cataract    removed bilateral  . Chronic diastolic CHF (congestive heart failure) ([place]) 06/02/2020  . Clotting disorder ([place])    h/o blood clot left leg  . Dementia ([place])   . Depression   . DVT (deep venous thrombosis) ([place]) 2011  . GERD (gastroesophageal reflux disease)   . HB (heart block)    s/p PPM, most recent generator change 12/11 by JA  . Heart murmur   . Hiatal hernia   . Hyperlipidemia   . Hypertension   . Hypothyroidism   . Osteoporosis   . PTE (post-transplant erythrocytosis)   . Pulmonary embolism ([place]) 2011   on coumadin  . UTI (lower urinary tract infection)    Past Surgical History:  Procedure Laterality Date  . BLADDER SURGERY     twice  . cataracts     bilateral  . CHOLECYSTECTOMY  09/2010  . COLONOSCOPY    . ESOPHAGOGASTRODUODENOSCOPY    . HEMORRHOID SURGERY    . hysterectomy (other)    .  INNER EAR SURGERY    . JOINT REPLACEMENT    . pacemaker     most recent generator 12/11 by JA  . TOTAL KNEE ARTHROPLASTY Left     Allergies  Allergen Reactions  . Alendronate Other (See Comments)    Can not tolerate  . Celebrex [Celecoxib] Other (See Comments)    Can not tolerate  . Meloxicam Other (See Comments)    Avoid   . Myrbetriq [Mirabegron] Other (See Comments)    Can not tolerate  . Norco [Hydrocodone-Acetaminophen] Other (See Comments)    Dizziness and jerking  . Pradaxa [Dabigatran  Etexilate Mesylate] Other (See Comments)    Can not tolerate  . Pradaxa [Dabigatran]   . Toviaz [Fesoterodine Fumarate Er] Other (See Comments)    Can not tolerate  . Vesicare [Solifenacin] Other (See Comments)    Can not tolerate    Allergies as of 07/22/2020      Reactions   Alendronate Other (See Comments)   Can not tolerate   Celebrex [celecoxib] Other (See Comments)   Can not tolerate   Meloxicam Other (See Comments)   Avoid   Myrbetriq [mirabegron] Other (See Comments)   Can not tolerate   Norco [hydrocodone-acetaminophen] Other (See Comments)   Dizziness and jerking   Pradaxa [dabigatran Etexilate Mesylate] Other (See Comments)   Can not tolerate   Pradaxa [dabigatran]    Toviaz [fesoterodine Fumarate Er] Other (See Comments)   Can not tolerate   Vesicare [solifenacin] Other (See Comments)   Can not tolerate      Medication List       Accurate as of July 22, 2020  1:15 PM. If you have any questions, ask your nurse or doctor.        Calcium Carb-Cholecalciferol 600-800 MG-UNIT Tabs Take 1 tablet by mouth daily.   cholecalciferol 1000 units tablet Commonly known as: VITAMIN D Take 1,000 Units by mouth daily.   denosumab 60 MG/ML Sosy injection Commonly known as: PROLIA Inject 60 mg into the skin. Once A Day on the 1st of Every 6th Month   gabapentin 100 MG capsule Commonly known as: NEURONTIN Take 100 mg by mouth as needed.   gabapentin 300 MG capsule Commonly known as: NEURONTIN TAKE 1 CAPSULE TWICE DAILY   levothyroxine 50 MCG tablet Commonly known as: SYNTHROID TAKE 1 TABLET (50 MCG TOTAL) BY MOUTH DAILY BEFORE BREAKFAST.   Lidocaine (Anorectal) 5 % Crea Commonly known as: RectiCare Insert into rectum 2 times a day and 2 more times as needed   loratadine 10 MG tablet Commonly known as: CLARITIN Take 10 mg by mouth daily.   memantine 10 MG tablet Commonly known as: NAMENDA Take 1 tablet (10 mg total) by mouth 2 (two) times daily.    metolazone 2.5 MG tablet Commonly known as: ZAROXOLYN Take 2.5 mg by mouth 2 (two) times a week. Only 2 doses for now   omeprazole 20 MG capsule Commonly known as: PRILOSEC TAKE 1 CAPSULE TWICE DAILY   potassium chloride 20 MEQ packet Commonly known as: KLOR-CON Take 20 mEq by mouth daily. 07/15/20-07/22/20: Take an extra pill on Mon and Friday   potassium chloride SA 20 MEQ tablet Commonly known as: KLOR-CON Take 20 mEq by mouth 2 (two) times daily.   pravastatin 20 MG tablet Commonly known as: PRAVACHOL TAKE 1 TABLET EVERY DAY   sertraline 25 MG tablet Commonly known as: ZOLOFT Take 25 mg by mouth every morning.   sertraline 50 MG tablet Commonly known  as: ZOLOFT Take 50 mg by mouth daily.   torsemide 20 MG tablet Commonly known as: DEMADEX Take 40 mg by mouth daily. What changed: Another medication with the same name was removed. Continue taking this medication, and follow the directions you see here. Changed by: Zoraya Fiorenza X Chosen Geske, NP   Tubersol 5 UNIT/0.1ML injection Generic drug: tuberculin Inject 5 Units into the skin. Once A Day Every 14 Days   warfarin 2.5 MG tablet Commonly known as: COUMADIN Take as directed by the anticoagulation clinic. If you are unsure how to take this medication, talk to your nurse or doctor. Original instructions: Take 2.5 mg by mouth. 2.5 mg M W F. 5mg  rest of the days (4days.) A Day on Sun   warfarin 5 MG tablet Commonly known as: COUMADIN Take as directed by the anticoagulation clinic. If you are unsure how to take this medication, talk to your nurse or doctor. Original instructions: Take 5 mg by mouth. 5 mg all days except Monday Wednesday and Friday.nce A Day on Mon, Tue, Wed, Thu, Fri, Sat       Review of Systems  Constitutional: Negative for activity change, appetite change, fever and unexpected weight change.  HENT: Positive for hearing loss. Negative for congestion, trouble swallowing and voice change.   Eyes: Negative for visual  disturbance.       Low vision  Respiratory: Negative for cough, shortness of breath and wheezing.   Cardiovascular: Positive for leg swelling.  Gastrointestinal: Negative for abdominal pain, constipation and nausea.  Genitourinary: Positive for frequency and urgency. Negative for dysuria.       Incontinent of urine, frequency, urgency at times.   Musculoskeletal: Positive for gait problem.  Skin:       Mild erythema BLE  Neurological: Negative for facial asymmetry, speech difficulty, weakness and headaches.       Memory loss  Psychiatric/Behavioral: Negative for confusion, hallucinations and sleep disturbance. The patient is not nervous/anxious.     Immunization History  Administered Date(s) Administered  . IPV 03/06/2016, 09/27/2016, 10/09/2017, 04/16/2018, 11/03/2018, 07/14/2019, 01/19/2020  . Influenza Whole 01/17/2011  . Influenza, High Dose Seasonal PF 02/24/2014, 01/05/2015, 01/09/2016, 01/10/2017, 02/25/2019  . Influenza,inj,Quad PF,6+ Mos 02/13/2018  . Influenza-Unspecified 02/11/2012, 02/14/2013, 02/24/2014, 02/02/2020  . Moderna Sars-Covid-2 Vaccination 05/18/2019, 06/15/2019, 03/28/2020  . Pneumococcal Conjugate-13 06/30/2014, 03/22/2018  . Pneumococcal Polysaccharide-23 02/17/2009, 01/09/2012  . Pneumococcal-Unspecified 06/30/2008, 02/17/2009  . Td 03/10/2012  . Tdap 02/12/2012  . Zoster 05/14/2008   Pertinent  Health Maintenance Due  Topic Date Due  . INFLUENZA VACCINE  Completed  . PNA vac Low Risk Adult  Completed  . DEXA SCAN  Discontinued   Fall Risk  07/05/2020 06/29/2020 06/08/2020 05/25/2020 04/20/2020  Falls in the past year? 0 0 0 0 0  Number falls in past yr: 0 0 0 0 0  Injury with Fall? - - - - -   Functional Status Survey:    Vitals:   07/22/20 1026  BP: 125/70  Pulse: 86  Resp: 20  Temp: (!) 97.3 F (36.3 C)  SpO2: 94%  Weight: 136 lb 8 oz (61.9 kg)  Height: 5' (1.524 m)   Body mass index is 26.66 kg/m. Physical Exam Vitals reviewed.   Constitutional:      General: She is not in acute distress.    Appearance: Normal appearance. She is not ill-appearing or toxic-appearing.  HENT:     Head: Normocephalic and atraumatic.     Nose: Nose normal.  Mouth/Throat:     Mouth: Mucous membranes are moist.  Eyes:     Extraocular Movements: Extraocular movements intact.     Conjunctiva/sclera: Conjunctivae normal.     Pupils: Pupils are equal, round, and reactive to light.  Cardiovascular:     Rate and Rhythm: Normal rate and regular rhythm.     Heart sounds: Murmur heard.      Comments: Pacer. DP pulses present R+L Pulmonary:     Effort: Pulmonary effort is normal.     Breath sounds: No wheezing, rhonchi or rales.  Abdominal:     General: Bowel sounds are normal. There is no distension.     Tenderness: There is no abdominal tenderness. There is no right CVA tenderness, left CVA tenderness, guarding or rebound.  Musculoskeletal:     Cervical back: Normal range of motion and neck supple.     Right lower leg: Edema present.     Left lower leg: Edema present.     Comments: 1+ edema BLE  Skin:    General: Skin is warm and dry.     Findings: Erythema present.     Comments: Mild erythema BLE. A small open are anterior R shin is covered with steri strips, no s/s of infection or drainage.   Neurological:     General: No focal deficit present.     Mental Status: She is alert. Mental status is at baseline.     Motor: No weakness.     Coordination: Coordination normal.     Gait: Gait abnormal.     Comments: Oriented to person, place. Not sure about time.   Psychiatric:        Mood and Affect: Mood normal.        Behavior: Behavior normal.        Thought Content: Thought content normal.        Judgment: Judgment normal.     Labs reviewed: Recent Labs    06/04/20 0336 06/05/20 0426 06/06/20 0427  NA 140 138 140  K 3.6 4.2 4.1  CL 107 106 108  CO2 25 27 24   GLUCOSE 149* 145* 153*  BUN 28* 26* 30*  CREATININE  0.67 0.80 0.76  CALCIUM 8.9 9.1 9.1  MG 2.0 2.1 2.2   Recent Labs    06/04/20 0336 06/05/20 0426 06/06/20 0427  AST 17 16 15   ALT 16 16 20   ALKPHOS 62 63 56  BILITOT 0.6 0.4 0.2*  PROT 6.1* 5.8* 5.5*  ALBUMIN 3.3* 3.1* 3.0*   Recent Labs    06/04/20 0336 06/05/20 0426 06/06/20 0427  WBC 10.3 10.6* 10.0  NEUTROABS 8.3* 9.3* 8.8*  HGB 12.1 12.0 12.2  HCT 39.2 38.5 37.9  MCV 93.6 94.1 90.9  PLT 171 169 160   Lab Results  Component Value Date   TSH 3.42 04/25/2020   No results found for: HGBA1C Lab Results  Component Value Date   CHOL 158 10/15/2019   HDL 60 10/15/2019   LDLCALC 78 10/15/2019   TRIG 76 06/03/2020   CHOLHDL 2.6 10/15/2019    Significant Diagnostic Results in last 30 days:  No results found.  Assessment/Plan Chronic diastolic CHF (congestive heart failure) ([place]) reported weeping legs, no weeping upon my examination today a small open area anterior right shin is covered with steri strips, no s/s of infection or drainage.   Chronic swelling in legs, LLE >RLE slightly, Hx of DVT LLE. Saw Cardiology. better on Torsemide and Metolazone. Last echoc normal EF 2016. Bun/creat  30/0.76 06/06/20  Hypothyroidism in adult Hx of Hypothyroidism, takes Levothyroxine, TH 2.19 03/31/20, pending f/u TSH   Depression, recurrent (HCC) Depression, takes Sertraline.   PAROXYSMAL ATRIAL FIBRILLATION Afib, on Coumadin, Hgb 12.2 06/06/20  Myoclonic jerking Myoclonic jerking functional, s/p neurology evaluation, taking Sertraline, Gabapentin. Prn Clonazepam  Age related osteoporosis Age related OP w/o current pathological fracture, taking Prolia per Rheumatology   Cognitive impairment Cognitive impairment: off Donepezil, takes Memantine. resides in [place].   Diarrhea in adult patient Chronic diarrhea, better, f/u GI, off Donepezil, taking Cholestyramine.  Mixed incontinence urge and stress Urinary incontinence, didn't tolerated Myrbetriq, declined Urology  eval.    ATRIOVENTRICULAR BLOCK, 3RD DEGREE AS, pacemaker, Hx of DVT on Coumadin, f/u Cardiology   GERD (gastroesophageal reflux disease) GERD, taking Omeprazole. Hgb 12.2 06/06/20     Family/ staff Communication: plan of care reviewed with the patient and charge nurse.   Labs/tests ordered:   Time spend 40 minutes.

## 2020-07-22 NOTE — Assessment & Plan Note (Signed)
Hx of Hypothyroidism, takes Levothyroxine, TH 2.19 03/31/20, pending f/u TSH

## 2020-07-27 NOTE — Progress Notes (Signed)
Electrophysiology Office Note Date: 07/28/2020  ID:  [name], DOB Nov 18, 1922, MRN [id number]  PCP: [name], MD Primary Cardiologist: Larae Grooms, MD Electrophysiologist: Thompson Grayer, MD   CC: Pacemaker follow-up  [name] is a 87 y.o. female seen today for Thompson Grayer, MD for routine electrophysiology followup.  Since last being seen in our clinic the patient reports doing OK. She lives at an ALF. She does have issues with chronic venous stasis and is being followed closely by the facility. She is on torsemide and metolazone. She is not very active per her daughter. No syncope. She has had some weeping on her legs which is currently being followed by facility as above.   Device History: Medtronic Dual Chamber PPM implanted 1998 for complete heart block; new RV lead and gen change 2004; gen change 2011  Past Medical History:  Diagnosis Date  . Arthritis   . Atrial fibrillation ([place])   . Cataract    removed bilateral  . Chronic diastolic CHF (congestive heart failure) ([place]) 06/02/2020  . Clotting disorder ([place])    h/o blood clot left leg  . Dementia ([place])   . Depression   . DVT (deep venous thrombosis) ([place]) 2011  . GERD (gastroesophageal reflux disease)   . HB (heart block)    s/p PPM, most recent generator change 12/11 by JA  . Heart murmur   . Hiatal hernia   . Hyperlipidemia   . Hypertension   . Hypothyroidism   . Osteoporosis   . PTE (post-transplant erythrocytosis)   . Pulmonary embolism ([facility]) 2011   on coumadin  . UTI (lower urinary tract infection)    Past Surgical History:  Procedure Laterality Date  . BLADDER SURGERY     twice  . cataracts     bilateral  . CHOLECYSTECTOMY  09/2010  . COLONOSCOPY    . ESOPHAGOGASTRODUODENOSCOPY    . HEMORRHOID SURGERY    . hysterectomy (other)    . INNER EAR SURGERY    . JOINT REPLACEMENT    . pacemaker     most recent generator 12/11 by JA  . TOTAL KNEE ARTHROPLASTY Left      Current Outpatient Medications  Medication Sig Dispense Refill  . Calcium Carb-Cholecalciferol 600-800 MG-UNIT TABS Take 1 tablet by mouth daily.    . cholecalciferol (VITAMIN D) 1000 UNITS tablet Take 1,000 Units by mouth daily.     [name] denosumab (PROLIA) 60 MG/ML SOSY injection Inject 60 mg into the skin. Once A Day on the 1st of Every 6th Month    . gabapentin (NEURONTIN) 100 MG capsule Take 100 mg by mouth as needed.    . gabapentin (NEURONTIN) 300 MG capsule TAKE 1 CAPSULE TWICE DAILY 180 capsule 1  . levothyroxine (SYNTHROID) 50 MCG tablet TAKE 1 TABLET (50 MCG TOTAL) BY MOUTH DAILY BEFORE BREAKFAST. 90 tablet 1  . Lidocaine, Anorectal, (RECTICARE) 5 % CREA Insert into rectum 2 times a day and 2 more times as needed 45 g 11  . loratadine (CLARITIN) 10 MG tablet Take 10 mg by mouth daily.    . memantine (NAMENDA) 10 MG tablet Take 1 tablet (10 mg total) by mouth 2 (two) times daily. 180 tablet 1  . omeprazole (PRILOSEC) 20 MG capsule TAKE 1 CAPSULE TWICE DAILY 180 capsule 1  . potassium chloride (KLOR-CON) 20 MEQ packet Take 20 mEq by mouth daily. 07/15/20-07/22/20: Take an extra pill on Mon and Friday    . potassium chloride SA (  KLOR-CON) 20 MEQ tablet Take 20 mEq by mouth 2 (two) times daily.    . pravastatin (PRAVACHOL) 20 MG tablet TAKE 1 TABLET EVERY DAY 90 tablet 1  . sertraline (ZOLOFT) 25 MG tablet Take 25 mg by mouth every morning.    . sertraline (ZOLOFT) 50 MG tablet Take 50 mg by mouth daily.    [name] torsemide (DEMADEX) 20 MG tablet Take 40 mg by mouth daily.    [name] tuberculin (TUBERSOL) 5 UNIT/0.1ML injection Inject 5 Units into the skin. Once A Day Every 14 Days    . warfarin (COUMADIN) 2.5 MG tablet Take 2.5 mg by mouth. 2.5 mg M W F. 5mg  rest of the days (4days.) A Day on Sun    . warfarin (COUMADIN) 5 MG tablet Take 5 mg by mouth. 5 mg all days except Monday Wednesday and Friday.nce A Day on Mon, Tue, Wed, Thu, Fri, Sat    . metolazone (ZAROXOLYN) 2.5 MG tablet Take 2.5 mg by  mouth 2 (two) times a week. Only 2 doses for now     No current facility-administered medications for this visit.    Allergies:   Alendronate, Celebrex [celecoxib], Meloxicam, Myrbetriq [mirabegron], Norco [hydrocodone-acetaminophen], Pradaxa [dabigatran etexilate mesylate], Pradaxa [dabigatran], Toviaz [fesoterodine fumarate er], and Vesicare [solifenacin]   Social History: Social History   Socioeconomic History  . Marital status: Married    Spouse name: Not on file  . Number of children: 2  . Years of education: Not on file  . Highest education level: Not on file  Occupational History  . Occupation: retired bookkeeper  Tobacco Use  . Smoking status: Never Smoker  . Smokeless tobacco: Never Used  Vaping Use  . Vaping Use: Never used  Substance and Sexual Activity  . Alcohol use: No  . Drug use: No  . Sexual activity: Never  Other Topics Concern  . Not on file  Social History Narrative   Tobacco use, amount per day now: 0      Past tobacco use, amount per day: 0      How many years did you use tobacco: 0      Alcohol use (drinks per week): 0      Diet:      Do you drink/eat things with caffeine? Yes      Marital status: Married            What year were you married? 1946      Do you live in a house, apartment, assisted living, condo, trailer? Apartment       Is it one or more stories? 1      How many persons live in your home? 2      Do you have any pets in your home? 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 Yes            How often? 3-4 times a week       Do you have a living will? Yes      Do you have a DNR form?            If not, do you want to discuss one?      Do you have signed POA/HPOA forms? Yes             Social Determinants of Health   Financial Resource Strain: Not on file  Food Insecurity: Not on file  Transportation Needs:  Not on file  Physical Activity: Not on file  Stress: Not on file  Social  Connections: Not on file  Intimate Partner Violence: Not on file    Family History: Family History  Problem Relation Age of Onset  . Stroke Father   . Heart disease Father   . Arthritis Father   . Heart disease Mother   . Arthritis Mother   . Stomach cancer Brother   . Stomach cancer Brother   . Liver cancer Brother   . Breast cancer Sister   . Colon cancer Neg Hx   . Colon polyps Neg Hx   . Esophageal cancer Neg Hx   . Rectal cancer Neg Hx      Review of Systems: All other systems reviewed and are otherwise negative except as noted above.  Physical Exam: Vitals:   07/28/20 1010  BP: 114/66  Pulse: 68  SpO2: 96%  Weight: 132 lb (59.9 kg)  Height: 5' (1.524 m)     GEN- The patient is well appearing, alert and oriented x 3 today.   HEENT: normocephalic, atraumatic; sclera clear, conjunctiva pink; hearing intact; oropharynx clear; neck supple  Lungs- Clear to ausculation bilaterally, normal work of breathing.  No wheezes, rales, rhonchi Heart- Regular rate and rhythm, no murmurs, rubs or gallops  GI- soft, non-tender, non-distended, bowel sounds present  Extremities- no clubbing or cyanosis. No edema MS- no significant deformity or atrophy Skin- warm and dry, no rash or lesion; PPM pocket well healed Psych- euthymic mood, full affect Neuro- strength and sensation are intact  PPM Interrogation- reviewed in detail today,  See PACEART report  EKG:  EKG is not ordered today. The ekg ordered 05/2020 shows AS VP at 92 bpm (NSR)  Recent Labs: 04/25/2020: TSH 3.42 06/06/2020: ALT 20; BUN 30; Creatinine, Ser 0.76; Hemoglobin 12.2; Magnesium 2.2; Platelets 160; Potassium 4.1; Sodium 140   Wt Readings from Last 3 Encounters:  07/28/20 132 lb (59.9 kg)  07/22/20 136 lb 8 oz (61.9 kg)  07/19/20 133 lb (60.3 kg)     Other studies Reviewed: Additional studies/ records that were reviewed today include: Previous EP office notes, Previous remote checks, Most recent labwork.    Assessment and Plan:  1. CHB s/p Medtronic PPM  Normal PPM function See Pace Art report No changes today  2. PAF/Prior PE No bleeding issues on coumadin with CHA2DS2VASC of at least 7.    3. Aortic stenosis Mild by echo 2016, at least moderate by exam Follow clinically given advanced age.  4. Chronic venous stasis Light hose in place. Recommend compression stockings vs ACE wraps/UNNA boots as previously mentioned by Dr. Irish Lack.  Complex diuresis managed by facility with torsemide and metolazone.  Monitor legs closely   Current medicines are reviewed at length with the patient today.   The patient does not have concerns regarding her medicines.  The following changes were made today:  none  Labs/ tests ordered today include:  No orders of the defined types were placed in this encounter.   Disposition:   Follow up with Dr. Rayann Heman or EP APP in 12 months.   Jacalyn Lefevre, PA-C  07/28/2020 12:50 PM  [address]  [place] [address] [phone] (office) [phone] (fax)

## 2020-07-28 ENCOUNTER — Ambulatory Visit (INDEPENDENT_AMBULATORY_CARE_PROVIDER_SITE_OTHER): Payer: Medicare Other | Admitting: Student

## 2020-07-28 ENCOUNTER — Other Ambulatory Visit: Payer: Self-pay

## 2020-07-28 VITALS — BP 114/66 | HR 68 | Ht 60.0 in | Wt 132.0 lb

## 2020-07-28 DIAGNOSIS — I442 Atrioventricular block, complete: Secondary | ICD-10-CM

## 2020-07-28 DIAGNOSIS — R600 Localized edema: Secondary | ICD-10-CM

## 2020-07-28 DIAGNOSIS — I4891 Unspecified atrial fibrillation: Secondary | ICD-10-CM

## 2020-07-28 DIAGNOSIS — Z7901 Long term (current) use of anticoagulants: Secondary | ICD-10-CM | POA: Diagnosis not present

## 2020-07-28 LAB — CUP PACEART INCLINIC DEVICE CHECK
Battery Impedance: 1710 Ohm
Battery Remaining Longevity: 41 mo
Battery Voltage: 2.76 V
Brady Statistic AP VP Percent: 1 %
Brady Statistic AP VS Percent: 0 %
Brady Statistic AS VP Percent: 99 %
Brady Statistic AS VS Percent: 0 %
Date Time Interrogation Session: 20220317092400
Implantable Lead Implant Date: 19980518
Implantable Lead Implant Date: 20040301
Implantable Lead Location: 753859
Implantable Lead Location: 753860
Implantable Lead Model: 4269
Implantable Lead Model: 4285
Implantable Lead Serial Number: 249440
Implantable Lead Serial Number: 295540
Implantable Pulse Generator Implant Date: 20111206
Lead Channel Impedance Value: 585 Ohm
Lead Channel Impedance Value: 790 Ohm
Lead Channel Pacing Threshold Amplitude: 1 V
Lead Channel Pacing Threshold Amplitude: 1.25 V
Lead Channel Pacing Threshold Pulse Width: 0.4 ms
Lead Channel Pacing Threshold Pulse Width: 0.4 ms
Lead Channel Sensing Intrinsic Amplitude: 1 mV
Lead Channel Setting Pacing Amplitude: 2 V
Lead Channel Setting Pacing Amplitude: 2.5 V
Lead Channel Setting Pacing Pulse Width: 0.4 ms
Lead Channel Setting Sensing Sensitivity: 5.6 mV

## 2020-07-28 NOTE — Patient Instructions (Signed)
Medication Instructions:  Your physician recommends that you continue on your current medications as directed. Please refer to the Current Medication list given to you today.  *If you need a refill on your cardiac medications before your next appointment, please call your pharmacy*   Lab Work: None  If you have labs (blood work) drawn today and your tests are completely normal, you will receive your results only by: . MyChart Message (if you have MyChart) OR . A paper copy in the mail If you have any lab test that is abnormal or we need to change your treatment, we will call you to review the results.  Follow-Up: At [facility], you and your health needs are our priority.  As part of our continuing mission to provide you with exceptional heart care, we have created designated Provider Care Teams.  These Care Teams include your primary Cardiologist (physician) and Advanced Practice Providers (APPs -  Physician Assistants and Nurse Practitioners) who all work together to provide you with the care you need, when you need it.  Your next appointment:   1 year(s)  The format for your next appointment:   In Person  Provider:   You may see James Allred, MD or one of the following Advanced Practice Providers on your designated Care Team:    Amber Seiler, NP  Renee Ursuy, PA-C  Michael "Andy" Tillery, PA-C   

## 2020-08-01 ENCOUNTER — Encounter: Payer: Self-pay | Admitting: Nurse Practitioner

## 2020-08-01 ENCOUNTER — Non-Acute Institutional Stay: Payer: Medicare Other | Admitting: Nurse Practitioner

## 2020-08-01 DIAGNOSIS — M810 Age-related osteoporosis without current pathological fracture: Secondary | ICD-10-CM

## 2020-08-01 DIAGNOSIS — R197 Diarrhea, unspecified: Secondary | ICD-10-CM | POA: Diagnosis not present

## 2020-08-01 DIAGNOSIS — F339 Major depressive disorder, recurrent, unspecified: Secondary | ICD-10-CM | POA: Diagnosis not present

## 2020-08-01 DIAGNOSIS — G253 Myoclonus: Secondary | ICD-10-CM

## 2020-08-01 DIAGNOSIS — E039 Hypothyroidism, unspecified: Secondary | ICD-10-CM | POA: Diagnosis not present

## 2020-08-01 DIAGNOSIS — N3946 Mixed incontinence: Secondary | ICD-10-CM

## 2020-08-01 DIAGNOSIS — I5032 Chronic diastolic (congestive) heart failure: Secondary | ICD-10-CM | POA: Diagnosis not present

## 2020-08-01 DIAGNOSIS — I442 Atrioventricular block, complete: Secondary | ICD-10-CM

## 2020-08-01 DIAGNOSIS — R4189 Other symptoms and signs involving cognitive functions and awareness: Secondary | ICD-10-CM

## 2020-08-01 DIAGNOSIS — I480 Paroxysmal atrial fibrillation: Secondary | ICD-10-CM | POA: Diagnosis not present

## 2020-08-01 DIAGNOSIS — K2100 Gastro-esophageal reflux disease with esophagitis, without bleeding: Secondary | ICD-10-CM | POA: Diagnosis not present

## 2020-08-01 DIAGNOSIS — K5901 Slow transit constipation: Secondary | ICD-10-CM | POA: Diagnosis not present

## 2020-08-01 NOTE — Assessment & Plan Note (Signed)
Chronic swelling in legs, LLE >RLE slightly, Hx of DVT LLE. Saw Cardiology. better on Torsemide and Metolazone. Last echoc normal EF 2016.Bun/creat 30/1.1 07/21/20

## 2020-08-01 NOTE — Assessment & Plan Note (Signed)
Chronic diarrhea, better, f/u GI, off Donepezil, taking Cholestyramine, also related to higher dose of Sertraline.

## 2020-08-01 NOTE — Assessment & Plan Note (Signed)
Her mood is stable, desires Sertraline 50mg  qd.

## 2020-08-01 NOTE — Assessment & Plan Note (Signed)
C/o hard stools, has to removed with fingers, will try MiraLax qd.

## 2020-08-01 NOTE — Assessment & Plan Note (Signed)
taking Omeprazole.Hgb 12.2 06/06/20

## 2020-08-01 NOTE — Assessment & Plan Note (Signed)
Myoclonic jerking functional, s/p neurology evaluation, takes Sertraline, Gabapentin. Prn Clonazepam. Neurology referral per patient's request.  Will update CBC/diff, CMP/eGFR, patient and her husband desire Sertraline 50mg qd.  

## 2020-08-01 NOTE — Assessment & Plan Note (Signed)
Hypothyroidism, takes Levothyroxine, TH 2.19 03/31/20

## 2020-08-01 NOTE — Assessment & Plan Note (Signed)
Urinary incontinence, didn't tolerated Myrbetriq, declined Urology eval. 

## 2020-08-01 NOTE — Assessment & Plan Note (Signed)
Cognitive impairment: off Donepezil, takes Memantine. resides in [facility]. 

## 2020-08-01 NOTE — Assessment & Plan Note (Signed)
on Coumadin, Hgb 12.2 06/06/20

## 2020-08-01 NOTE — Assessment & Plan Note (Signed)
Age related OP w/o current pathological fracture, taking Prolia per Rheumatology

## 2020-08-01 NOTE — Assessment & Plan Note (Signed)
AS, pacemaker, Hx of DVT on Coumadin, f/u Cardiology

## 2020-08-01 NOTE — Progress Notes (Signed)
Location:    [facility] Room Number: 38 Place of Service:  ALF [phone]) Provider:  Marlana Latus NP  [name], MD  Patient Care Team: [name], MD as PCP - General (Internal Medicine) Jettie Booze, MD as PCP - Cardiology (Cardiology) Thompson Grayer, MD as PCP - Electrophysiology (Cardiology) Blanch Media, MD as Consulting Physician (Neurology) Thompson Grayer, MD as Consulting Physician (Cardiology)  Extended Emergency Contact Information Primary Emergency Contact: Margaree Mackintosh Address: [place], [place] Phone: [phone] Relation: Spouse Secondary Emergency Contact: [name] Address: [address]          [place], [place] [address] [name] Phone: [phone] Relation: Daughter  Code Status:  DNR Goals of care: Advanced Directive information Advanced Directives 06/03/2020  Does Patient Have a Medical Advance Directive? No  Type of Advance Directive -  Does patient want to make changes to medical advance directive? -  Copy of [place] in Chart? -  Would patient like information on creating a medical advance directive? No - Patient declined     Chief Complaint  Patient presents with  . Acute Visit    Neurology referral    HPI:  Pt is a 87 y.o. female seen today for an acute visit for neurology referral to f/u myoclonic jerking functional symptoms.   Depression, takes Sertraline.  Chronic swelling in legs, LLE >RLE slightly, Hx of DVT LLE. Saw Cardiology. better on Torsemide and Metolazone. Last echoc normal EF 2016.Bun/creat 30/1.1 07/21/20 Hx of Hypothyroidism, takes Levothyroxine, TH 2.19 03/31/20 Afib, on Coumadin, Hgb 12.2 06/06/20 Myoclonic jerking functional, s/p neurology evaluation, takes Sertraline, Gabapentin. Prn Clonazepam Age related OP w/o  current pathological fracture, taking Prolia per Rheumatology Cognitive impairment: off Donepezil, takes Memantine.resides in [place]. Chronic diarrhea, better, f/u GI, off Donepezil, taking Cholestyramine, also related to higher dose of Sertraline.  Urinary incontinence, didn't tolerated Myrbetriq, declined Urology eval.  AS, pacemaker, Hx of DVT on Coumadin, f/u Cardiology GERD, taking Omeprazole.Hgb 12.2 06/06/20  Past Medical History:  Diagnosis Date  . Arthritis   . Atrial fibrillation ([facility])   . Cataract    removed bilateral  . Chronic diastolic CHF (congestive heart failure) ([facility]) 06/02/2020  . Clotting disorder ([place])    h/o blood clot left leg  . Dementia ([facility])   . Depression   . DVT (deep venous thrombosis) ([facility]) 2011  . GERD (gastroesophageal reflux disease)   . HB (heart block)    s/p PPM, most recent generator change 12/11 by JA  . Heart murmur   . Hiatal hernia   . Hyperlipidemia   . Hypertension   . Hypothyroidism   . Osteoporosis   . PTE (post-transplant erythrocytosis)   . Pulmonary embolism ([facility]) 2011   on coumadin  . UTI (lower urinary tract infection)    Past Surgical History:  Procedure Laterality Date  . BLADDER SURGERY     twice  . cataracts     bilateral  . CHOLECYSTECTOMY  09/2010  . COLONOSCOPY    . ESOPHAGOGASTRODUODENOSCOPY    . HEMORRHOID SURGERY    . hysterectomy (other)    . INNER EAR SURGERY    . JOINT REPLACEMENT    . pacemaker     most recent generator 12/11 by JA  . TOTAL KNEE ARTHROPLASTY Left     Allergies  Allergen Reactions  .  Alendronate Other (See Comments)    Can not tolerate  . Celebrex [Celecoxib] Other (See Comments)    Can not tolerate  . Meloxicam Other (See Comments)    Avoid   . Myrbetriq [Mirabegron] Other (See Comments)    Can not tolerate  . Norco [Hydrocodone-Acetaminophen] Other (See Comments)    Dizziness and jerking  . Pradaxa [Dabigatran  Etexilate Mesylate] Other (See Comments)    Can not tolerate  . Pradaxa [Dabigatran]   . Toviaz [Fesoterodine Fumarate Er] Other (See Comments)    Can not tolerate  . Vesicare [Solifenacin] Other (See Comments)    Can not tolerate    Allergies as of 08/01/2020      Reactions   Alendronate Other (See Comments)   Can not tolerate   Celebrex [celecoxib] Other (See Comments)   Can not tolerate   Meloxicam Other (See Comments)   Avoid   Myrbetriq [mirabegron] Other (See Comments)   Can not tolerate   Norco [hydrocodone-acetaminophen] Other (See Comments)   Dizziness and jerking   Pradaxa [dabigatran Etexilate Mesylate] Other (See Comments)   Can not tolerate   Pradaxa [dabigatran]    Toviaz [fesoterodine Fumarate Er] Other (See Comments)   Can not tolerate   Vesicare [solifenacin] Other (See Comments)   Can not tolerate      Medication List       Accurate as of August 01, 2020 11:59 PM. If you have any questions, ask your nurse or doctor.        STOP taking these medications   potassium chloride 20 MEQ packet Commonly known as: KLOR-CON Stopped by:  X , NP   Tubersol 5 UNIT/0.1ML injection Generic drug: tuberculin Stopped by:  X , NP     TAKE these medications   acetaminophen 325 MG tablet Commonly known as: TYLENOL Take 650 mg by mouth 2 (two) times daily.   acetaminophen 325 MG tablet Commonly known as: TYLENOL Take 650 mg by mouth every 4 (four) hours as needed.   Calcium Carb-Cholecalciferol 600-800 MG-UNIT Tabs Take 1 tablet by mouth daily.   cholecalciferol 1000 units tablet Commonly known as: VITAMIN D Take 1,000 Units by mouth daily.   denosumab 60 MG/ML Sosy injection Commonly known as: PROLIA Inject 60 mg into the skin. Once A Day on the 1st of Every 6th Month   gabapentin 100 MG capsule Commonly known as: NEURONTIN Take 100 mg by mouth as needed.   gabapentin 300 MG capsule Commonly known as: NEURONTIN TAKE 1 CAPSULE TWICE  DAILY   levothyroxine 50 MCG tablet Commonly known as: SYNTHROID TAKE 1 TABLET (50 MCG TOTAL) BY MOUTH DAILY BEFORE BREAKFAST.   Lidocaine (Anorectal) 5 % Crea Commonly known as: RectiCare Insert into rectum 2 times a day and 2 more times as needed   loratadine 10 MG tablet Commonly known as: CLARITIN Take 10 mg by mouth daily.   magnesium hydroxide 400 MG/5ML suspension Commonly known as: MILK OF MAGNESIA Take by mouth daily as needed for mild constipation.   memantine 10 MG tablet Commonly known as: NAMENDA Take 1 tablet (10 mg total) by mouth 2 (two) times daily.   metolazone 2.5 MG tablet Commonly known as: ZAROXOLYN Take 2.5 mg by mouth 2 (two) times a week. Only 2 doses for now   omeprazole 20 MG capsule Commonly known as: PRILOSEC TAKE 1 CAPSULE TWICE DAILY   potassium chloride SA 20 MEQ tablet Commonly known as: KLOR-CON Take 20 mEq by mouth 2 (two) times daily.  pravastatin 20 MG tablet Commonly known as: PRAVACHOL TAKE 1 TABLET EVERY DAY   sertraline 25 MG tablet Commonly known as: ZOLOFT Take 25 mg by mouth every morning.   sertraline 50 MG tablet Commonly known as: ZOLOFT Take 50 mg by mouth daily.   torsemide 20 MG tablet Commonly known as: DEMADEX Take 40 mg by mouth daily.   warfarin 2.5 MG tablet Commonly known as: COUMADIN Take 2.5 mg by mouth. 2.5 mg M W F. 19m rest of the days (4days.) A Day on Sun   warfarin 5 MG tablet Commonly known as: COUMADIN Take 5 mg by mouth. 5 mg all days except Monday Wednesday and Friday.nce A Day on Mon, Tue, Wed, Thu, Fri, Sat       Review of Systems  Constitutional: Negative for activity change, appetite change and fever.  HENT: Positive for hearing loss. Negative for congestion, trouble swallowing and voice change.   Eyes: Negative for visual disturbance.       Low vision  Respiratory: Negative for cough, shortness of breath and wheezing.   Cardiovascular: Positive for leg swelling.   Gastrointestinal: Positive for constipation. Negative for abdominal pain and nausea.  Genitourinary: Positive for frequency and urgency. Negative for dysuria.       Incontinent of urine, frequency, urgency at times.   Musculoskeletal: Positive for gait problem.  Skin:       Mild erythema BLE  Neurological: Positive for tremors. Negative for facial asymmetry, speech difficulty, weakness and headaches.       Memory loss. Jerking symptoms.   Psychiatric/Behavioral: Negative for confusion, hallucinations and sleep disturbance. The patient is not nervous/anxious.     Immunization History  Administered Date(s) Administered  . IPV 03/06/2016, 09/27/2016, 10/09/2017, 04/16/2018, 11/03/2018, 07/14/2019, 01/19/2020  . Influenza Whole 01/17/2011  . Influenza, High Dose Seasonal PF 02/24/2014, 01/05/2015, 01/09/2016, 01/10/2017, 02/25/2019  . Influenza,inj,Quad PF,6+ Mos 02/13/2018  . Influenza-Unspecified 02/11/2012, 02/14/2013, 02/24/2014, 02/02/2020  . Moderna Sars-Covid-2 Vaccination 05/18/2019, 06/15/2019, 03/28/2020  . Pneumococcal Conjugate-13 06/30/2014, 03/22/2018  . Pneumococcal Polysaccharide-23 02/17/2009, 01/09/2012  . Pneumococcal-Unspecified 06/30/2008, 02/17/2009  . Td 03/10/2012  . Tdap 02/12/2012  . Zoster 05/14/2008   Pertinent  Health Maintenance Due  Topic Date Due  . INFLUENZA VACCINE  Completed  . PNA vac Low Risk Adult  Completed  . DEXA SCAN  Discontinued   Fall Risk  07/05/2020 06/29/2020 06/08/2020 05/25/2020 04/20/2020  Falls in the past year? 0 0 0 0 0  Number falls in past yr: 0 0 0 0 0  Injury with Fall? - - - - -   Functional Status Survey:    Vitals:   08/01/20 1056  BP: 116/75  Pulse: 82  Resp: 16  Temp: 97.6 F (36.4 C)  SpO2: 97%  Weight: 132 lb (59.9 kg)  Height: 5' (1.524 m)   Body mass index is 25.78 kg/m. Physical Exam Vitals reviewed.  Constitutional:      General: She is not in acute distress.    Appearance: Normal appearance. She is  not ill-appearing or toxic-appearing.  HENT:     Head: Normocephalic and atraumatic.     Nose: Nose normal.     Mouth/Throat:     Mouth: Mucous membranes are moist.  Eyes:     Extraocular Movements: Extraocular movements intact.     Conjunctiva/sclera: Conjunctivae normal.     Pupils: Pupils are equal, round, and reactive to light.  Cardiovascular:     Rate and Rhythm: Normal rate and regular rhythm.  Heart sounds: Murmur heard.      Comments: Pacer. DP pulses present R+L Pulmonary:     Effort: Pulmonary effort is normal.     Breath sounds: No wheezing, rhonchi or rales.  Abdominal:     General: Bowel sounds are normal. There is no distension.     Tenderness: There is no abdominal tenderness. There is no right CVA tenderness, left CVA tenderness, guarding or rebound.  Musculoskeletal:     Cervical back: Normal range of motion and neck supple.     Right lower leg: Edema present.     Left lower leg: Edema present.     Comments: 1+ edema BLE  Skin:    General: Skin is warm and dry.     Findings: Erythema present.     Comments: Mild erythema BLE. A small open are anterior R shin is covered with steri strips, no s/s of infection or drainage.   Neurological:     General: No focal deficit present.     Mental Status: She is alert. Mental status is at baseline.     Motor: No weakness.     Coordination: Coordination normal.     Gait: Gait abnormal.     Comments: Oriented to person, place. Not sure about time.   Psychiatric:        Mood and Affect: Mood normal.        Behavior: Behavior normal.        Thought Content: Thought content normal.        Judgment: Judgment normal.     Labs reviewed: Recent Labs    06/04/20 0336 06/05/20 0426 06/06/20 0427 07/21/20 0000  NA 140 138 140 144  K 3.6 4.2 4.1 3.5  CL 107 106 108 96*  CO2 _0 35*  GLUCOSE 149* 145* 153*  --   BUN 28* 26* 30* 30*  CREATININE 0.67 0.80 0.76 1.1  CALCIUM 8.9 9.1 9.1 9.8  MG 2.0 2.1 2.2  --     Recent Labs    06/04/20 0336 06/05/20 0426 06/06/20 0427  AST _1 ALT _2 ALKPHOS 62 63 56  BILITOT 0.6 0.4 0.2*  PROT 6.1* 5.8* 5.5*  ALBUMIN 3.3* 3.1* 3.0*   Recent Labs    06/04/20 0336 06/05/20 0426 06/06/20 0427  WBC 10.3 10.6* 10.0  NEUTROABS 8.3* 9.3* 8.8*  HGB 12.1 12.0 12.2  HCT 39.2 38.5 37.9  MCV 93.6 94.1 90.9  PLT 171 169 160   Lab Results  Component Value Date   TSH 3.42 04/25/2020   No results found for: HGBA1C Lab Results  Component Value Date   CHOL 158 10/15/2019   HDL 60 10/15/2019   LDLCALC 78 10/15/2019   TRIG 76 06/03/2020   CHOLHDL 2.6 10/15/2019    Significant Diagnostic Results in last 30 days:  CUP PACEART INCLINIC DEVICE CHECK  Result Date: 07/28/2020 Pacemaker check in clinic. Normal device function. Thresholds, sensing, impedances consistent with previous measurements. Device programmed to maximize longevity. Numerous AMS with known history of paroxysmal A-fib. Longest episode 7.5 minutes. AT/AF burden <1.0%. Device programmed at appropriate safety margins. Histogram distribution appropriate for patient activity level. Estimated longevity 3 years, 5 months. Patient enrolled in remote follow-up. Next remote 09/01/2020.Rexene Edison, BSN, RN, EMT-P   Assessment/Plan Myoclonic jerking Myoclonic jerking functional, s/p neurology evaluation, takes Sertraline, Gabapentin. Prn Clonazepam. Neurology referral per patient's request.  Will update CBC/diff, CMP/eGFR, patient and her husband desire Sertraline 41m qd.  Depression, recurrent ([place]) Her mood is stable, desires Sertraline 79m qd.   Slow transit constipation C/o hard stools, has to removed with fingers, will try MiraLax qd.   Chronic diastolic CHF (congestive heart failure) ([facility]) Chronic swelling in legs, LLE >RLE slightly, Hx of DVT LLE. Saw Cardiology. better on Torsemide and Metolazone. Last echoc normal EF 2016.Bun/creat 30/1.1 07/21/20   Hypothyroidism in  adult Hypothyroidism, takes Levothyroxine, TH 2.19 03/31/20   PAROXYSMAL ATRIAL FIBRILLATION  on Coumadin, Hgb 12.2 06/06/20   Age related osteoporosis Age related OP w/o current pathological fracture, taking Prolia per Rheumatology  Cognitive impairment Cognitive impairment: off Donepezil, takes Memantine.resides in [place]   Diarrhea in adult patient Chronic diarrhea, better, f/u GI, off Donepezil, taking Cholestyramine, also related to higher dose of Sertraline.   Mixed incontinence urge and stress Urinary incontinence, didn't tolerated Myrbetriq, declined Urology eval.    ATRIOVENTRICULAR BLOCK, 3RD DEGREE AS, pacemaker, Hx of DVT on Coumadin, f/u Cardiology   GERD (gastroesophageal reflux disease) taking Omeprazole.Hgb 12.2 06/06/20      Family/ staff Communication: plan of care reviewed with the patient and her husband   Labs/tests ordered:  CBC/diff, CMP/eGFR  Time spend 40 minutes.

## 2020-08-02 ENCOUNTER — Encounter: Payer: Self-pay | Admitting: Nurse Practitioner

## 2020-08-02 DIAGNOSIS — D649 Anemia, unspecified: Secondary | ICD-10-CM | POA: Diagnosis not present

## 2020-08-03 ENCOUNTER — Encounter: Payer: Self-pay | Admitting: Internal Medicine

## 2020-08-03 ENCOUNTER — Non-Acute Institutional Stay: Payer: Medicare Other | Admitting: Internal Medicine

## 2020-08-03 DIAGNOSIS — I480 Paroxysmal atrial fibrillation: Secondary | ICD-10-CM | POA: Diagnosis not present

## 2020-08-03 DIAGNOSIS — E039 Hypothyroidism, unspecified: Secondary | ICD-10-CM | POA: Diagnosis not present

## 2020-08-03 DIAGNOSIS — N3946 Mixed incontinence: Secondary | ICD-10-CM

## 2020-08-03 DIAGNOSIS — R4189 Other symptoms and signs involving cognitive functions and awareness: Secondary | ICD-10-CM

## 2020-08-03 DIAGNOSIS — F339 Major depressive disorder, recurrent, unspecified: Secondary | ICD-10-CM | POA: Diagnosis not present

## 2020-08-03 DIAGNOSIS — R600 Localized edema: Secondary | ICD-10-CM | POA: Diagnosis not present

## 2020-08-03 DIAGNOSIS — I5032 Chronic diastolic (congestive) heart failure: Secondary | ICD-10-CM | POA: Diagnosis not present

## 2020-08-03 DIAGNOSIS — M810 Age-related osteoporosis without current pathological fracture: Secondary | ICD-10-CM | POA: Diagnosis not present

## 2020-08-03 DIAGNOSIS — G253 Myoclonus: Secondary | ICD-10-CM | POA: Diagnosis not present

## 2020-08-03 NOTE — Progress Notes (Signed)
Location: [facility] Room Number: 59 Place of Service:  ALF [phone])  Provider: Veleta Miners MD  Code Status: DNR Goals of Care:  Advanced Directives 06/03/2020  Does Patient Have a Medical Advance Directive? No  Type of Advance Directive -  Does patient want to make changes to medical advance directive? -  Copy of [place] in Chart? -  Would patient like information on creating a medical advance directive? No - Patient declined     Chief Complaint  Patient presents with  . Acute Visit    LE edema    HPI: Patient is a 87 y.o. female seen today for an acute visit for LE edema and Frequent Urination  Patient has a history of CHB s/p pacemaker,  PAF on Coumadin, moderate AS Also has history of cognitive impairment, myoclonic jerks thought to be functional on Zoloft Osteoporosis on Prolia per rheumatology Urinary incontinence Chronic diarrheafollowedby GI H/o Covid Pneumonia  Lives in [place]. Was on high doses of Diuretics to help with her LE edema and Cellulitis Her Legs are now better but she is having Urinary Incontinence and Having Accidents needing help from Nurses to change She does not want to take Demadex any more No Other issues  Past Medical History:  Diagnosis Date  . Arthritis   . Atrial fibrillation ([place])   . Cataract    removed bilateral  . Chronic diastolic CHF (congestive heart failure) ([place]) 06/02/2020  . Clotting disorder ([place])    h/o blood clot left leg  . Dementia ([place])   . Depression   . DVT (deep venous thrombosis) ([place]) 2011  . GERD (gastroesophageal reflux disease)   . HB (heart block)    s/p PPM, most recent generator change 12/11 by JA  . Heart murmur   . Hiatal hernia   . Hyperlipidemia   . Hypertension   . Hypothyroidism   . Osteoporosis   . PTE (post-transplant erythrocytosis)   . Pulmonary embolism ([place]) 2011   on coumadin  . UTI (lower urinary tract infection)     Past Surgical History:   Procedure Laterality Date  . BLADDER SURGERY     twice  . cataracts     bilateral  . CHOLECYSTECTOMY  09/2010  . COLONOSCOPY    . ESOPHAGOGASTRODUODENOSCOPY    . HEMORRHOID SURGERY    . hysterectomy (other)    . INNER EAR SURGERY    . JOINT REPLACEMENT    . pacemaker     most recent generator 12/11 by JA  . TOTAL KNEE ARTHROPLASTY Left     Allergies  Allergen Reactions  . Alendronate Other (See Comments)    Can not tolerate  . Celebrex [Celecoxib] Other (See Comments)    Can not tolerate  . Meloxicam Other (See Comments)    Avoid   . Myrbetriq [Mirabegron] Other (See Comments)    Can not tolerate  . Norco [Hydrocodone-Acetaminophen] Other (See Comments)    Dizziness and jerking  . Pradaxa [Dabigatran Etexilate Mesylate] Other (See Comments)    Can not tolerate  . Pradaxa [Dabigatran]   . Toviaz [Fesoterodine Fumarate Er] Other (See Comments)    Can not tolerate  . Vesicare [Solifenacin] Other (See Comments)    Can not tolerate    Outpatient Encounter Medications as of 08/03/2020  Medication Sig  . acetaminophen (TYLENOL) 325 MG tablet Take 650 mg by mouth 2 (two) times daily.  . Calcium Carb-Cholecalciferol 600-800 MG-UNIT TABS Take 1 tablet by  mouth daily.  . cholecalciferol (VITAMIN D) 1000 UNITS tablet Take 1,000 Units by mouth daily.   [name] denosumab (PROLIA) 60 MG/ML SOSY injection Inject 60 mg into the skin. Once A Day on the 1st of Every 6th Month  . gabapentin (NEURONTIN) 100 MG capsule Take 100 mg by mouth as needed.  . gabapentin (NEURONTIN) 300 MG capsule TAKE 1 CAPSULE TWICE DAILY  . levothyroxine (SYNTHROID) 50 MCG tablet TAKE 1 TABLET (50 MCG TOTAL) BY MOUTH DAILY BEFORE BREAKFAST.  [name] Lidocaine, Anorectal, (RECTICARE) 5 % CREA Insert into rectum 2 times a day and 2 more times as needed  . loratadine (CLARITIN) 10 MG tablet Take 10 mg by mouth daily.  . memantine (NAMENDA) 10 MG tablet Take 1 tablet (10 mg total) by mouth 2 (two) times daily.  [name] omeprazole  (PRILOSEC) 20 MG capsule TAKE 1 CAPSULE TWICE DAILY  . polyethylene glycol (MIRALAX / GLYCOLAX) 17 g packet Take 17 g by mouth daily.  . potassium chloride SA (KLOR-CON) 20 MEQ tablet Take 20 mEq by mouth 2 (two) times daily.  . pravastatin (PRAVACHOL) 20 MG tablet TAKE 1 TABLET EVERY DAY  . sertraline (ZOLOFT) 25 MG tablet Take 25 mg by mouth every morning.  . torsemide (DEMADEX) 20 MG tablet Take 40 mg by mouth daily.  [name] warfarin (COUMADIN) 2.5 MG tablet Take 2.5 mg by mouth. On sundays, 5mg  the rest of the days Monday-Saturday.  . warfarin (COUMADIN) 5 MG tablet Take 5 mg by mouth. 5 mg all days except Sunday.  . metolazone (ZAROXOLYN) 2.5 MG tablet Take 2.5 mg by mouth 2 (two) times a week. Only 2 doses for now  . [DISCONTINUED] colestipol (COLESTID) 5 g granules Take 5 g by mouth daily with supper.  . [DISCONTINUED] sertraline (ZOLOFT) 50 MG tablet Take 50 mg by mouth daily.   No facility-administered encounter medications on file as of 08/03/2020.    Review of Systems:  Review of Systems  Constitutional: Negative.   HENT: Negative.   Respiratory: Negative.   Cardiovascular: Positive for leg swelling.  Gastrointestinal: Positive for constipation.  Genitourinary: Positive for frequency and urgency.  Musculoskeletal: Negative.   Skin: Negative.   Neurological: Negative.   Psychiatric/Behavioral: Positive for confusion.    Health Maintenance  Topic Date Due  . TETANUS/TDAP  03/10/2022  . INFLUENZA VACCINE  Completed  . COVID-19 Vaccine  Completed  . PNA vac Low Risk Adult  Completed  . HPV VACCINES  Aged Out  . DEXA SCAN  Discontinued    Physical Exam: Vitals:   08/03/20 1312  BP: 116/75  Pulse: 82  Resp: 16  Temp: (!) 97.5 F (36.4 C)  SpO2: 94%  Weight: 132 lb (59.9 kg)  Height: 5' (1.524 m)   Body mass index is 25.78 kg/m. Physical Exam  Constitutional: Oriented to person, place, and time. Well-developed and well-nourished.  HENT:  Head: Normocephalic.   Mouth/Throat: Oropharynx is clear and moist.  Eyes: Pupils are equal, round, and reactive to light.  Neck: Neck supple.  Cardiovascular: Normal rate and normal heart sounds.  Murmur Present Pulmonary/Chest: Effort normal and breath sounds normal. No respiratory distress. No wheezes. She has no rales.  Abdominal: Soft. Bowel sounds are normal. No distension. There is no tenderness. There is no rebound.  Musculoskeletal: Mild Edema with Chronic Venous changes Lymphadenopathy: none Neurological: Alert and oriented to person, place, and time. Walks with her walker Skin: Skin is warm and dry.  Psychiatric: Normal mood and affect. Behavior is  normal. Thought content normal.    Labs reviewed: Basic Metabolic Panel: Recent Labs    10/15/19 0000 03/31/20 0820 04/25/20 0800 06/02/20 1900 06/04/20 0336 06/05/20 0426 06/06/20 0427 07/21/20 0000  NA 141 141 142   < > 140 138 140 144  K 4.3 4.2 4.3   < > 3.6 4.2 4.1 3.5  CL 104 104 103   < > 107 106 108 96*  CO2 28 32 29   < > 25 27 24  35*  GLUCOSE 83 93 91   < > 149* 145* 153*  --   BUN 22 21 21    < > 28* 26* 30* 30*  CREATININE 0.73 0.77 0.95*   < > 0.67 0.80 0.76 1.1  CALCIUM 9.7 9.7 9.6   < > 8.9 9.1 9.1 9.8  MG  --   --   --    < > 2.0 2.1 2.2  --   TSH 2.17 2.19 3.42  --   --   --   --   --    < > = values in this interval not displayed.   Liver Function Tests: Recent Labs    06/04/20 0336 06/05/20 0426 06/06/20 0427  AST 17 16 15   ALT 16 16 20   ALKPHOS 62 63 56  BILITOT 0.6 0.4 0.2*  PROT 6.1* 5.8* 5.5*  ALBUMIN 3.3* 3.1* 3.0*   No results for input(s): LIPASE, AMYLASE in the last 8760 hours. No results for input(s): AMMONIA in the last 8760 hours. CBC: Recent Labs    06/04/20 0336 06/05/20 0426 06/06/20 0427  WBC 10.3 10.6* 10.0  NEUTROABS 8.3* 9.3* 8.8*  HGB 12.1 12.0 12.2  HCT 39.2 38.5 37.9  MCV 93.6 94.1 90.9  PLT 171 169 160   Lipid Panel: Recent Labs    10/15/19 0000 06/03/20 0121  CHOL 158  --    HDL 60  --   LDLCALC 78  --   TRIG 116 76  CHOLHDL 2.6  --    No results found for: HGBA1C  Procedures since last visit: CUP PACEART INCLINIC DEVICE CHECK  Result Date: 07/28/2020 Pacemaker check in clinic. Normal device function. Thresholds, sensing, impedances consistent with previous measurements. Device programmed to maximize longevity. Numerous AMS with known history of paroxysmal A-fib. Longest episode 7.5 minutes. AT/AF burden <1.0%. Device programmed at appropriate safety margins. Histogram distribution appropriate for patient activity level. Estimated longevity 3 years, 5 months. Patient enrolled in remote follow-up. Next remote 09/01/2020.Rexene Edison, BSN, RN, EMT-P   Assessment/Plan 1. Edema of both legs Much Improved  Decrase Toresimide to 20 mg and Potassium 20 meq Check Weight 3/week BMP in 1 week  2. Myoclonic jerking Patient wants to See Neurology in [facility]   [facility]rolog[facility] from [facility]19 She has h/o Myoclonic Jerks. He has tried different regimes.And now has her onZoloft and Neurontin.He does not think she has NeuroDegenerative disorder and think it is functional Cannot Reduce Zoloft or Neurontin  3. Depression, recurrent ([place]) On Zoloft  4. Chronic diastolic CHF (congestive heart failure) (H[facility]) On Demadex  5. Hypothyroidism in adult TSH normal in 12/21  6. Paroxysmal atrial fibrillation ([facility]) On Coumadin   7. Age-related osteoporosis without current pathological fracture On Prolia  8. Cognitive impairment Off Aricept Has helped her diarrhea Only on Namenda now MMSE 27/30before Failed her Clock Drawing Recall was 3/3  9. Mixed incontinence urge and stress Intolerant to Myrebetiq Does not want tosee urology yet 10 HLD ON Statin  Labs/tests ordered:

## 2020-08-11 DIAGNOSIS — I10 Essential (primary) hypertension: Secondary | ICD-10-CM | POA: Diagnosis not present

## 2020-08-12 DIAGNOSIS — R41841 Cognitive communication deficit: Secondary | ICD-10-CM | POA: Diagnosis not present

## 2020-08-12 DIAGNOSIS — R2681 Unsteadiness on feet: Secondary | ICD-10-CM | POA: Diagnosis not present

## 2020-08-12 DIAGNOSIS — R260 Ataxic gait: Secondary | ICD-10-CM | POA: Diagnosis not present

## 2020-08-12 DIAGNOSIS — R2689 Other abnormalities of gait and mobility: Secondary | ICD-10-CM | POA: Diagnosis not present

## 2020-08-12 DIAGNOSIS — M6281 Muscle weakness (generalized): Secondary | ICD-10-CM | POA: Diagnosis not present

## 2020-08-12 DIAGNOSIS — R1311 Dysphagia, oral phase: Secondary | ICD-10-CM | POA: Diagnosis not present

## 2020-08-12 DIAGNOSIS — R293 Abnormal posture: Secondary | ICD-10-CM | POA: Diagnosis not present

## 2020-08-15 DIAGNOSIS — R41841 Cognitive communication deficit: Secondary | ICD-10-CM | POA: Diagnosis not present

## 2020-08-15 DIAGNOSIS — R1311 Dysphagia, oral phase: Secondary | ICD-10-CM | POA: Diagnosis not present

## 2020-08-15 DIAGNOSIS — R293 Abnormal posture: Secondary | ICD-10-CM | POA: Diagnosis not present

## 2020-08-15 DIAGNOSIS — R2689 Other abnormalities of gait and mobility: Secondary | ICD-10-CM | POA: Diagnosis not present

## 2020-08-15 DIAGNOSIS — M6281 Muscle weakness (generalized): Secondary | ICD-10-CM | POA: Diagnosis not present

## 2020-08-15 DIAGNOSIS — R2681 Unsteadiness on feet: Secondary | ICD-10-CM | POA: Diagnosis not present

## 2020-08-16 DIAGNOSIS — R2689 Other abnormalities of gait and mobility: Secondary | ICD-10-CM | POA: Diagnosis not present

## 2020-08-16 DIAGNOSIS — R293 Abnormal posture: Secondary | ICD-10-CM | POA: Diagnosis not present

## 2020-08-16 DIAGNOSIS — R2681 Unsteadiness on feet: Secondary | ICD-10-CM | POA: Diagnosis not present

## 2020-08-16 DIAGNOSIS — M6281 Muscle weakness (generalized): Secondary | ICD-10-CM | POA: Diagnosis not present

## 2020-08-16 DIAGNOSIS — R41841 Cognitive communication deficit: Secondary | ICD-10-CM | POA: Diagnosis not present

## 2020-08-16 DIAGNOSIS — R1311 Dysphagia, oral phase: Secondary | ICD-10-CM | POA: Diagnosis not present

## 2020-08-17 DIAGNOSIS — M6281 Muscle weakness (generalized): Secondary | ICD-10-CM | POA: Diagnosis not present

## 2020-08-17 DIAGNOSIS — R2689 Other abnormalities of gait and mobility: Secondary | ICD-10-CM | POA: Diagnosis not present

## 2020-08-17 DIAGNOSIS — R2681 Unsteadiness on feet: Secondary | ICD-10-CM | POA: Diagnosis not present

## 2020-08-17 DIAGNOSIS — R293 Abnormal posture: Secondary | ICD-10-CM | POA: Diagnosis not present

## 2020-08-17 DIAGNOSIS — R1311 Dysphagia, oral phase: Secondary | ICD-10-CM | POA: Diagnosis not present

## 2020-08-17 DIAGNOSIS — R41841 Cognitive communication deficit: Secondary | ICD-10-CM | POA: Diagnosis not present

## 2020-08-18 DIAGNOSIS — R293 Abnormal posture: Secondary | ICD-10-CM | POA: Diagnosis not present

## 2020-08-18 DIAGNOSIS — M6281 Muscle weakness (generalized): Secondary | ICD-10-CM | POA: Diagnosis not present

## 2020-08-18 DIAGNOSIS — R2689 Other abnormalities of gait and mobility: Secondary | ICD-10-CM | POA: Diagnosis not present

## 2020-08-18 DIAGNOSIS — R41841 Cognitive communication deficit: Secondary | ICD-10-CM | POA: Diagnosis not present

## 2020-08-18 DIAGNOSIS — R1311 Dysphagia, oral phase: Secondary | ICD-10-CM | POA: Diagnosis not present

## 2020-08-18 DIAGNOSIS — R2681 Unsteadiness on feet: Secondary | ICD-10-CM | POA: Diagnosis not present

## 2020-08-19 DIAGNOSIS — R1311 Dysphagia, oral phase: Secondary | ICD-10-CM | POA: Diagnosis not present

## 2020-08-19 DIAGNOSIS — R41841 Cognitive communication deficit: Secondary | ICD-10-CM | POA: Diagnosis not present

## 2020-08-19 DIAGNOSIS — M6281 Muscle weakness (generalized): Secondary | ICD-10-CM | POA: Diagnosis not present

## 2020-08-19 DIAGNOSIS — R293 Abnormal posture: Secondary | ICD-10-CM | POA: Diagnosis not present

## 2020-08-19 DIAGNOSIS — R2681 Unsteadiness on feet: Secondary | ICD-10-CM | POA: Diagnosis not present

## 2020-08-19 DIAGNOSIS — R2689 Other abnormalities of gait and mobility: Secondary | ICD-10-CM | POA: Diagnosis not present

## 2020-08-22 DIAGNOSIS — R2689 Other abnormalities of gait and mobility: Secondary | ICD-10-CM | POA: Diagnosis not present

## 2020-08-22 DIAGNOSIS — R293 Abnormal posture: Secondary | ICD-10-CM | POA: Diagnosis not present

## 2020-08-22 DIAGNOSIS — R1311 Dysphagia, oral phase: Secondary | ICD-10-CM | POA: Diagnosis not present

## 2020-08-22 DIAGNOSIS — R2681 Unsteadiness on feet: Secondary | ICD-10-CM | POA: Diagnosis not present

## 2020-08-22 DIAGNOSIS — M6281 Muscle weakness (generalized): Secondary | ICD-10-CM | POA: Diagnosis not present

## 2020-08-22 DIAGNOSIS — R41841 Cognitive communication deficit: Secondary | ICD-10-CM | POA: Diagnosis not present

## 2020-08-23 DIAGNOSIS — R2681 Unsteadiness on feet: Secondary | ICD-10-CM | POA: Diagnosis not present

## 2020-08-23 DIAGNOSIS — R2689 Other abnormalities of gait and mobility: Secondary | ICD-10-CM | POA: Diagnosis not present

## 2020-08-23 DIAGNOSIS — R1311 Dysphagia, oral phase: Secondary | ICD-10-CM | POA: Diagnosis not present

## 2020-08-23 DIAGNOSIS — M6281 Muscle weakness (generalized): Secondary | ICD-10-CM | POA: Diagnosis not present

## 2020-08-23 DIAGNOSIS — R41841 Cognitive communication deficit: Secondary | ICD-10-CM | POA: Diagnosis not present

## 2020-08-23 DIAGNOSIS — R293 Abnormal posture: Secondary | ICD-10-CM | POA: Diagnosis not present

## 2020-08-24 DIAGNOSIS — R1311 Dysphagia, oral phase: Secondary | ICD-10-CM | POA: Diagnosis not present

## 2020-08-24 DIAGNOSIS — R2689 Other abnormalities of gait and mobility: Secondary | ICD-10-CM | POA: Diagnosis not present

## 2020-08-24 DIAGNOSIS — R2681 Unsteadiness on feet: Secondary | ICD-10-CM | POA: Diagnosis not present

## 2020-08-24 DIAGNOSIS — R293 Abnormal posture: Secondary | ICD-10-CM | POA: Diagnosis not present

## 2020-08-24 DIAGNOSIS — R41841 Cognitive communication deficit: Secondary | ICD-10-CM | POA: Diagnosis not present

## 2020-08-24 DIAGNOSIS — M6281 Muscle weakness (generalized): Secondary | ICD-10-CM | POA: Diagnosis not present

## 2020-08-26 DIAGNOSIS — R1311 Dysphagia, oral phase: Secondary | ICD-10-CM | POA: Diagnosis not present

## 2020-08-26 DIAGNOSIS — R293 Abnormal posture: Secondary | ICD-10-CM | POA: Diagnosis not present

## 2020-08-26 DIAGNOSIS — R41841 Cognitive communication deficit: Secondary | ICD-10-CM | POA: Diagnosis not present

## 2020-08-26 DIAGNOSIS — M6281 Muscle weakness (generalized): Secondary | ICD-10-CM | POA: Diagnosis not present

## 2020-08-26 DIAGNOSIS — R2689 Other abnormalities of gait and mobility: Secondary | ICD-10-CM | POA: Diagnosis not present

## 2020-08-26 DIAGNOSIS — R2681 Unsteadiness on feet: Secondary | ICD-10-CM | POA: Diagnosis not present

## 2020-08-29 DIAGNOSIS — M6281 Muscle weakness (generalized): Secondary | ICD-10-CM | POA: Diagnosis not present

## 2020-08-29 DIAGNOSIS — R41841 Cognitive communication deficit: Secondary | ICD-10-CM | POA: Diagnosis not present

## 2020-08-29 DIAGNOSIS — R2689 Other abnormalities of gait and mobility: Secondary | ICD-10-CM | POA: Diagnosis not present

## 2020-08-29 DIAGNOSIS — R2681 Unsteadiness on feet: Secondary | ICD-10-CM | POA: Diagnosis not present

## 2020-08-29 DIAGNOSIS — R293 Abnormal posture: Secondary | ICD-10-CM | POA: Diagnosis not present

## 2020-08-29 DIAGNOSIS — R1311 Dysphagia, oral phase: Secondary | ICD-10-CM | POA: Diagnosis not present

## 2020-08-30 DIAGNOSIS — R293 Abnormal posture: Secondary | ICD-10-CM | POA: Diagnosis not present

## 2020-08-30 DIAGNOSIS — M6281 Muscle weakness (generalized): Secondary | ICD-10-CM | POA: Diagnosis not present

## 2020-08-30 DIAGNOSIS — R1311 Dysphagia, oral phase: Secondary | ICD-10-CM | POA: Diagnosis not present

## 2020-08-30 DIAGNOSIS — R41841 Cognitive communication deficit: Secondary | ICD-10-CM | POA: Diagnosis not present

## 2020-08-30 DIAGNOSIS — R2681 Unsteadiness on feet: Secondary | ICD-10-CM | POA: Diagnosis not present

## 2020-08-30 DIAGNOSIS — R2689 Other abnormalities of gait and mobility: Secondary | ICD-10-CM | POA: Diagnosis not present

## 2020-08-31 DIAGNOSIS — R1311 Dysphagia, oral phase: Secondary | ICD-10-CM | POA: Diagnosis not present

## 2020-08-31 DIAGNOSIS — R2681 Unsteadiness on feet: Secondary | ICD-10-CM | POA: Diagnosis not present

## 2020-08-31 DIAGNOSIS — R41841 Cognitive communication deficit: Secondary | ICD-10-CM | POA: Diagnosis not present

## 2020-08-31 DIAGNOSIS — M6281 Muscle weakness (generalized): Secondary | ICD-10-CM | POA: Diagnosis not present

## 2020-08-31 DIAGNOSIS — R293 Abnormal posture: Secondary | ICD-10-CM | POA: Diagnosis not present

## 2020-08-31 DIAGNOSIS — R2689 Other abnormalities of gait and mobility: Secondary | ICD-10-CM | POA: Diagnosis not present

## 2020-09-01 ENCOUNTER — Ambulatory Visit (INDEPENDENT_AMBULATORY_CARE_PROVIDER_SITE_OTHER): Payer: Medicare Other

## 2020-09-01 DIAGNOSIS — I442 Atrioventricular block, complete: Secondary | ICD-10-CM

## 2020-09-01 LAB — CUP PACEART REMOTE DEVICE CHECK
Battery Impedance: 1853 Ohm
Battery Remaining Longevity: 38 mo
Battery Voltage: 2.75 V
Brady Statistic AP VP Percent: 1 %
Brady Statistic AP VS Percent: 0 %
Brady Statistic AS VP Percent: 99 %
Brady Statistic AS VS Percent: 0 %
Date Time Interrogation Session: 20220420151548
Implantable Lead Implant Date: 19980518
Implantable Lead Implant Date: 20040301
Implantable Lead Location: 753859
Implantable Lead Location: 753860
Implantable Lead Model: 4269
Implantable Lead Model: 4285
Implantable Lead Serial Number: 249440
Implantable Lead Serial Number: 295540
Implantable Pulse Generator Implant Date: 20111206
Lead Channel Impedance Value: 604 Ohm
Lead Channel Impedance Value: 855 Ohm
Lead Channel Pacing Threshold Amplitude: 1.25 V
Lead Channel Pacing Threshold Pulse Width: 0.4 ms
Lead Channel Setting Pacing Amplitude: 2 V
Lead Channel Setting Pacing Amplitude: 2.5 V
Lead Channel Setting Pacing Pulse Width: 0.4 ms
Lead Channel Setting Sensing Sensitivity: 5.6 mV

## 2020-09-02 DIAGNOSIS — M6281 Muscle weakness (generalized): Secondary | ICD-10-CM | POA: Diagnosis not present

## 2020-09-02 DIAGNOSIS — R41841 Cognitive communication deficit: Secondary | ICD-10-CM | POA: Diagnosis not present

## 2020-09-02 DIAGNOSIS — R2681 Unsteadiness on feet: Secondary | ICD-10-CM | POA: Diagnosis not present

## 2020-09-02 DIAGNOSIS — R2689 Other abnormalities of gait and mobility: Secondary | ICD-10-CM | POA: Diagnosis not present

## 2020-09-02 DIAGNOSIS — R1311 Dysphagia, oral phase: Secondary | ICD-10-CM | POA: Diagnosis not present

## 2020-09-02 DIAGNOSIS — R293 Abnormal posture: Secondary | ICD-10-CM | POA: Diagnosis not present

## 2020-09-02 DIAGNOSIS — M79672 Pain in left foot: Secondary | ICD-10-CM | POA: Diagnosis not present

## 2020-09-02 DIAGNOSIS — L84 Corns and callosities: Secondary | ICD-10-CM | POA: Diagnosis not present

## 2020-09-02 DIAGNOSIS — M216X2 Other acquired deformities of left foot: Secondary | ICD-10-CM | POA: Diagnosis not present

## 2020-09-05 DIAGNOSIS — R2681 Unsteadiness on feet: Secondary | ICD-10-CM | POA: Diagnosis not present

## 2020-09-05 DIAGNOSIS — R2689 Other abnormalities of gait and mobility: Secondary | ICD-10-CM | POA: Diagnosis not present

## 2020-09-05 DIAGNOSIS — M6281 Muscle weakness (generalized): Secondary | ICD-10-CM | POA: Diagnosis not present

## 2020-09-05 DIAGNOSIS — R1311 Dysphagia, oral phase: Secondary | ICD-10-CM | POA: Diagnosis not present

## 2020-09-05 DIAGNOSIS — R293 Abnormal posture: Secondary | ICD-10-CM | POA: Diagnosis not present

## 2020-09-05 DIAGNOSIS — R41841 Cognitive communication deficit: Secondary | ICD-10-CM | POA: Diagnosis not present

## 2020-09-06 DIAGNOSIS — R2681 Unsteadiness on feet: Secondary | ICD-10-CM | POA: Diagnosis not present

## 2020-09-06 DIAGNOSIS — R41841 Cognitive communication deficit: Secondary | ICD-10-CM | POA: Diagnosis not present

## 2020-09-06 DIAGNOSIS — R1311 Dysphagia, oral phase: Secondary | ICD-10-CM | POA: Diagnosis not present

## 2020-09-06 DIAGNOSIS — R293 Abnormal posture: Secondary | ICD-10-CM | POA: Diagnosis not present

## 2020-09-06 DIAGNOSIS — M6281 Muscle weakness (generalized): Secondary | ICD-10-CM | POA: Diagnosis not present

## 2020-09-06 DIAGNOSIS — E785 Hyperlipidemia, unspecified: Secondary | ICD-10-CM | POA: Diagnosis not present

## 2020-09-06 DIAGNOSIS — R2689 Other abnormalities of gait and mobility: Secondary | ICD-10-CM | POA: Diagnosis not present

## 2020-09-07 ENCOUNTER — Encounter: Payer: Medicare Other | Admitting: Internal Medicine

## 2020-09-07 ENCOUNTER — Encounter: Payer: Self-pay | Admitting: Internal Medicine

## 2020-09-07 DIAGNOSIS — R2689 Other abnormalities of gait and mobility: Secondary | ICD-10-CM | POA: Diagnosis not present

## 2020-09-07 DIAGNOSIS — R2681 Unsteadiness on feet: Secondary | ICD-10-CM | POA: Diagnosis not present

## 2020-09-07 DIAGNOSIS — R1311 Dysphagia, oral phase: Secondary | ICD-10-CM | POA: Diagnosis not present

## 2020-09-07 DIAGNOSIS — R293 Abnormal posture: Secondary | ICD-10-CM | POA: Diagnosis not present

## 2020-09-07 DIAGNOSIS — R41841 Cognitive communication deficit: Secondary | ICD-10-CM | POA: Diagnosis not present

## 2020-09-07 DIAGNOSIS — M6281 Muscle weakness (generalized): Secondary | ICD-10-CM | POA: Diagnosis not present

## 2020-09-13 DIAGNOSIS — R41841 Cognitive communication deficit: Secondary | ICD-10-CM | POA: Diagnosis not present

## 2020-09-13 DIAGNOSIS — R1311 Dysphagia, oral phase: Secondary | ICD-10-CM | POA: Diagnosis not present

## 2020-09-14 DIAGNOSIS — R41841 Cognitive communication deficit: Secondary | ICD-10-CM | POA: Diagnosis not present

## 2020-09-14 DIAGNOSIS — R1311 Dysphagia, oral phase: Secondary | ICD-10-CM | POA: Diagnosis not present

## 2020-09-16 DIAGNOSIS — R1311 Dysphagia, oral phase: Secondary | ICD-10-CM | POA: Diagnosis not present

## 2020-09-16 DIAGNOSIS — R41841 Cognitive communication deficit: Secondary | ICD-10-CM | POA: Diagnosis not present

## 2020-09-19 ENCOUNTER — Encounter: Payer: Self-pay | Admitting: Orthopedic Surgery

## 2020-09-19 ENCOUNTER — Non-Acute Institutional Stay: Payer: Medicare Other | Admitting: Orthopedic Surgery

## 2020-09-19 DIAGNOSIS — G253 Myoclonus: Secondary | ICD-10-CM

## 2020-09-19 MED ORDER — GABAPENTIN 100 MG PO CAPS: 200.0000 mg | ORAL_CAPSULE | Freq: Every day | ORAL | 3 refills | Status: AC

## 2020-09-19 NOTE — Progress Notes (Signed)
Location:  [place] Room Number: 34 Place of Service:  ALF [phone]) Provider: Windell Moulding, AGNP-C  [name], MD  Patient Care Team: [name], MD as PCP - General (Internal Medicine) Jettie Booze, MD as PCP - Cardiology (Cardiology) Thompson Grayer, MD as PCP - Electrophysiology (Cardiology) Blanch Media, MD as Consulting Physician (Neurology) Thompson Grayer, MD as Consulting Physician (Cardiology)  Extended Emergency Contact Information Primary Emergency Contact: Margaree Mackintosh Address: [place], [place] Phone: [phone] Relation: Spouse Secondary Emergency Contact: [name] Address: [address]          [place], [place] [address] [name] of [place] Phone: [phone] Relation: Daughter  Code Status: DNR Goals of care: Advanced Directive information Advanced Directives 06/03/2020  Does Patient Have a Medical Advance Directive? No  Type of Advance Directive -  Does patient want to make changes to medical advance directive? -  Copy of [place] in Chart? -  Would patient like information on creating a medical advance directive? No - Patient declined     Chief Complaint  Patient presents with  . Acute Visit    Involuntary movements, myoclonic jerking    HPI:  Pt is a 87 y.o. female seen today for acute visit for involuntary movements.   She reports worsening involuntary movements within the last month. Upper arms and torso will jerk often. This was observed several imes during our visit. She also reports some trouble going to sleep at night. She is scheduled to see Dr. Kathlene November 06/13 with neurology. Reports gabapentin as helpful, but she is having to take gabapentin 100 mg prn dose daily at lunch. She is requesting lunchtime dose be scheduled.   No recent falls, injuries or behavioral outbursts.     Past Medical History:   Diagnosis Date  . Arthritis   . Atrial fibrillation ([place])   . Cataract    removed bilateral  . Chronic diastolic CHF (congestive heart failure) ([place]) 06/02/2020  . Clotting disorder ([place])    h/o blood clot left leg  . Dementia ([place])   . Depression   . DVT (deep venous thrombosis) ([place]) 2011  . GERD (gastroesophageal reflux disease)   . HB (heart block)    s/p PPM, most recent generator change 12/11 by JA  . Heart murmur   . Hiatal hernia   . Hyperlipidemia   . Hypertension   . Hypothyroidism   . Osteoporosis   . PTE (post-transplant erythrocytosis)   . Pulmonary embolism ([place]) 2011   on coumadin  . UTI (lower urinary tract infection)    Past Surgical History:  Procedure Laterality Date  . BLADDER SURGERY     twice  . cataracts     bilateral  . CHOLECYSTECTOMY  09/2010  . COLONOSCOPY    . ESOPHAGOGASTRODUODENOSCOPY    . HEMORRHOID SURGERY    . hysterectomy (other)    . INNER EAR SURGERY    . JOINT REPLACEMENT    . pacemaker     most recent generator 12/11 by JA  . TOTAL KNEE ARTHROPLASTY Left     Allergies  Allergen Reactions  . Alendronate Other (See Comments)    Can not tolerate  . Celebrex [Celecoxib] Other (See Comments)    Can not tolerate  . Meloxicam Other (See Comments)    Avoid   . Myrbetriq [Mirabegron] Other (See Comments)  Can not tolerate  . Norco [Hydrocodone-Acetaminophen] Other (See Comments)    Dizziness and jerking  . Pradaxa [Dabigatran Etexilate Mesylate] Other (See Comments)    Can not tolerate  . Pradaxa [Dabigatran]   . Toviaz [Fesoterodine Fumarate Er] Other (See Comments)    Can not tolerate  . Vesicare [Solifenacin] Other (See Comments)    Can not tolerate    Outpatient Encounter Medications as of 09/19/2020  Medication Sig  . acetaminophen (TYLENOL) 325 MG tablet Take 650 mg by mouth 2 (two) times daily.  . Calcium Carb-Cholecalciferol 600-800 MG-UNIT TABS Take 1 tablet by mouth daily.  . cholecalciferol (VITAMIN D) 1000  UNITS tablet Take 1,000 Units by mouth daily.   [name] denosumab (PROLIA) 60 MG/ML SOSY injection Inject 60 mg into the skin. Once A Day on the 1st of Every 6th Month  . gabapentin (NEURONTIN) 100 MG capsule Take 100 mg by mouth as needed.  . gabapentin (NEURONTIN) 300 MG capsule TAKE 1 CAPSULE TWICE DAILY  . levothyroxine (SYNTHROID) 50 MCG tablet TAKE 1 TABLET (50 MCG TOTAL) BY MOUTH DAILY BEFORE BREAKFAST.  [name] Lidocaine, Anorectal, (RECTICARE) 5 % CREA Insert into rectum 2 times a day and 2 more times as needed  . loratadine (CLARITIN) 10 MG tablet Take 10 mg by mouth daily.  . memantine (NAMENDA) 10 MG tablet Take 1 tablet (10 mg total) by mouth 2 (two) times daily.  [name] omeprazole (PRILOSEC) 20 MG capsule TAKE 1 CAPSULE TWICE DAILY  . polyethylene glycol (MIRALAX / GLYCOLAX) 17 g packet Take 17 g by mouth daily.  . potassium chloride SA (KLOR-CON) 20 MEQ tablet Take 20 mEq by mouth daily.  . pravastatin (PRAVACHOL) 20 MG tablet TAKE 1 TABLET EVERY DAY  . sertraline (ZOLOFT) 25 MG tablet Take 25 mg by mouth 2 (two) times daily.  [name] torsemide (DEMADEX) 20 MG tablet Take 20 mg by mouth daily.  [name] warfarin (COUMADIN) 2.5 MG tablet Take 2.5 mg by mouth. On sundays, 5mg  the rest of the days Monday-Saturday.  . warfarin (COUMADIN) 5 MG tablet Take 5 mg by mouth. 5 mg all days except Sunday.  . [DISCONTINUED] colestipol (COLESTID) 5 g granules Take 5 g by mouth daily with supper.   No facility-administered encounter medications on file as of 09/19/2020.    Review of Systems  Constitutional: Negative for activity change, appetite change, fatigue and fever.  Respiratory: Negative for cough, shortness of breath and wheezing.   Cardiovascular: Negative for chest pain and leg swelling.  Neurological: Negative for dizziness, seizures and headaches.       Involuntary movements  Psychiatric/Behavioral: Positive for confusion and sleep disturbance. Negative for dysphoric mood. The patient is not nervous/anxious.      Immunization History  Administered Date(s) Administered  . IPV 03/06/2016, 09/27/2016, 10/09/2017, 04/16/2018, 11/03/2018, 07/14/2019, 01/19/2020  . Influenza Whole 01/17/2011  . Influenza, High Dose Seasonal PF 02/24/2014, 01/05/2015, 01/09/2016, 01/10/2017, 02/25/2019  . Influenza,inj,Quad PF,6+ Mos 02/13/2018  . Influenza-Unspecified 02/11/2012, 02/14/2013, 02/24/2014, 02/02/2020  . Moderna Sars-Covid-2 Vaccination 05/18/2019, 06/15/2019, 03/28/2020  . Pneumococcal Conjugate-13 06/30/2014, 03/22/2018  . Pneumococcal Polysaccharide-23 02/17/2009, 01/09/2012  . Pneumococcal-Unspecified 06/30/2008, 02/17/2009  . Td 03/10/2012  . Tdap 02/12/2012  . Zoster 05/14/2008   Pertinent  Health Maintenance Due  Topic Date Due  . INFLUENZA VACCINE  12/12/2020  . PNA vac Low Risk Adult  Completed  . DEXA SCAN  Discontinued   Fall Risk  07/05/2020 06/29/2020 06/08/2020 05/25/2020 04/20/2020  Falls in the past year? 0 0  0 0 0  Number falls in past yr: 0 0 0 0 0  Injury with Fall? - - - - -   Functional Status Survey:    Vitals:   09/19/20 1256  BP: 110/61  Pulse: 88  Resp: 16  Temp: (!) 97.5 F (36.4 C)  SpO2: 94%  Weight: 133 lb 6.4 oz (60.5 kg)  Height: 5' (1.524 m)   Body mass index is 26.05 kg/m. Physical Exam Vitals reviewed.  Constitutional:      General: She is not in acute distress. Cardiovascular:     Rate and Rhythm: Normal rate. Rhythm irregular.     Pulses: Normal pulses.     Heart sounds: Murmur heard.    Pulmonary:     Effort: Pulmonary effort is normal. No respiratory distress.     Breath sounds: Normal breath sounds. No wheezing.  Abdominal:     General: Bowel sounds are normal. There is no distension.     Palpations: Abdomen is soft.     Tenderness: There is no abdominal tenderness.  Musculoskeletal:     Right lower leg: No edema.     Left lower leg: No edema.  Skin:    General: Skin is warm and dry.     Capillary Refill: Capillary refill takes less  than 2 seconds.  Neurological:     General: No focal deficit present.     Mental Status: She is alert. Mental status is at baseline.     Cranial Nerves: No cranial nerve deficit.     Sensory: No sensory deficit.     Motor: Weakness present.     Coordination: Coordination abnormal.     Gait: Gait abnormal.     Comments: Myoclonic jerking of arms and torso  Psychiatric:        Mood and Affect: Mood normal.        Behavior: Behavior normal.        Cognition and Memory: Memory is impaired.     Labs reviewed: Recent Labs    06/04/20 0336 06/05/20 0426 06/06/20 0427 07/21/20 0000  NA 140 138 140 144  K 3.6 4.2 4.1 3.5  CL 107 106 108 96*  CO2 25 27 24  35*  GLUCOSE 149* 145* 153*  --   BUN 28* 26* 30* 30*  CREATININE 0.67 0.80 0.76 1.1  CALCIUM 8.9 9.1 9.1 9.8  MG 2.0 2.1 2.2  --    Recent Labs    06/04/20 0336 06/05/20 0426 06/06/20 0427  AST 17 16 15   ALT 16 16 20   ALKPHOS 62 63 56  BILITOT 0.6 0.4 0.2*  PROT 6.1* 5.8* 5.5*  ALBUMIN 3.3* 3.1* 3.0*   Recent Labs    06/04/20 0336 06/05/20 0426 06/06/20 0427  WBC 10.3 10.6* 10.0  NEUTROABS 8.3* 9.3* 8.8*  HGB 12.1 12.0 12.2  HCT 39.2 38.5 37.9  MCV 93.6 94.1 90.9  PLT 171 169 160   Lab Results  Component Value Date   TSH 3.42 04/25/2020   No results found for: HGBA1C Lab Results  Component Value Date   CHOL 158 10/15/2019   HDL 60 10/15/2019   LDLCALC 78 10/15/2019   TRIG 76 06/03/2020   CHOLHDL 2.6 10/15/2019    Significant Diagnostic Results in last 30 days:  CUP PACEART REMOTE DEVICE CHECK  Result Date: 09/01/2020 Scheduled remote reviewed. 1 AHR event appears disorganized atrial activity; duration logged 55 seconds.  Histogram controlled.  Normal device function.  R. Powers, CVRS Next remote 74  days.   Assessment/Plan 1. Myoclonic jerking - ongoing, requiring prn gabapentin during the day, decreased sleep - scheduled to see new neurologist 06/13 - cont gabapentin 300 mg po bid - will  start gabapentin 200 mg po daily at lunch - cbc/diff, cmp, magnesium   Family/ staff Communication: plan discussed with patient and nurse  Labs/tests ordered: cbc/diff, cmp, magnesium

## 2020-09-19 NOTE — Progress Notes (Signed)
Remote pacemaker transmission.   

## 2020-09-20 DIAGNOSIS — R41841 Cognitive communication deficit: Secondary | ICD-10-CM | POA: Diagnosis not present

## 2020-09-20 DIAGNOSIS — R1311 Dysphagia, oral phase: Secondary | ICD-10-CM | POA: Diagnosis not present

## 2020-09-21 DIAGNOSIS — R1311 Dysphagia, oral phase: Secondary | ICD-10-CM | POA: Diagnosis not present

## 2020-09-21 DIAGNOSIS — R41841 Cognitive communication deficit: Secondary | ICD-10-CM | POA: Diagnosis not present

## 2020-09-22 DIAGNOSIS — F0390 Unspecified dementia without behavioral disturbance: Secondary | ICD-10-CM | POA: Diagnosis not present

## 2020-09-22 DIAGNOSIS — D649 Anemia, unspecified: Secondary | ICD-10-CM | POA: Diagnosis not present

## 2020-09-23 DIAGNOSIS — R1311 Dysphagia, oral phase: Secondary | ICD-10-CM | POA: Diagnosis not present

## 2020-09-23 DIAGNOSIS — R41841 Cognitive communication deficit: Secondary | ICD-10-CM | POA: Diagnosis not present

## 2020-09-28 DIAGNOSIS — R41841 Cognitive communication deficit: Secondary | ICD-10-CM | POA: Diagnosis not present

## 2020-09-28 DIAGNOSIS — R1311 Dysphagia, oral phase: Secondary | ICD-10-CM | POA: Diagnosis not present

## 2020-09-30 DIAGNOSIS — R1311 Dysphagia, oral phase: Secondary | ICD-10-CM | POA: Diagnosis not present

## 2020-09-30 DIAGNOSIS — R41841 Cognitive communication deficit: Secondary | ICD-10-CM | POA: Diagnosis not present

## 2020-10-04 DIAGNOSIS — R41841 Cognitive communication deficit: Secondary | ICD-10-CM | POA: Diagnosis not present

## 2020-10-04 DIAGNOSIS — R1311 Dysphagia, oral phase: Secondary | ICD-10-CM | POA: Diagnosis not present

## 2020-10-05 DIAGNOSIS — R1311 Dysphagia, oral phase: Secondary | ICD-10-CM | POA: Diagnosis not present

## 2020-10-05 DIAGNOSIS — R41841 Cognitive communication deficit: Secondary | ICD-10-CM | POA: Diagnosis not present

## 2020-10-07 DIAGNOSIS — R41841 Cognitive communication deficit: Secondary | ICD-10-CM | POA: Diagnosis not present

## 2020-10-07 DIAGNOSIS — R1311 Dysphagia, oral phase: Secondary | ICD-10-CM | POA: Diagnosis not present

## 2020-10-11 DIAGNOSIS — R41841 Cognitive communication deficit: Secondary | ICD-10-CM | POA: Diagnosis not present

## 2020-10-11 DIAGNOSIS — R1311 Dysphagia, oral phase: Secondary | ICD-10-CM | POA: Diagnosis not present

## 2020-10-12 DIAGNOSIS — R1311 Dysphagia, oral phase: Secondary | ICD-10-CM | POA: Diagnosis not present

## 2020-10-12 DIAGNOSIS — R41841 Cognitive communication deficit: Secondary | ICD-10-CM | POA: Diagnosis not present

## 2020-10-17 ENCOUNTER — Encounter: Payer: Self-pay | Admitting: Orthopedic Surgery

## 2020-10-17 ENCOUNTER — Non-Acute Institutional Stay: Payer: Medicare Other | Admitting: Orthopedic Surgery

## 2020-10-17 DIAGNOSIS — M810 Age-related osteoporosis without current pathological fracture: Secondary | ICD-10-CM | POA: Diagnosis not present

## 2020-10-17 DIAGNOSIS — R4189 Other symptoms and signs involving cognitive functions and awareness: Secondary | ICD-10-CM | POA: Diagnosis not present

## 2020-10-17 DIAGNOSIS — I5032 Chronic diastolic (congestive) heart failure: Secondary | ICD-10-CM | POA: Diagnosis not present

## 2020-10-17 DIAGNOSIS — R600 Localized edema: Secondary | ICD-10-CM | POA: Diagnosis not present

## 2020-10-17 DIAGNOSIS — F339 Major depressive disorder, recurrent, unspecified: Secondary | ICD-10-CM

## 2020-10-17 DIAGNOSIS — E039 Hypothyroidism, unspecified: Secondary | ICD-10-CM | POA: Diagnosis not present

## 2020-10-17 DIAGNOSIS — K5901 Slow transit constipation: Secondary | ICD-10-CM

## 2020-10-17 DIAGNOSIS — I442 Atrioventricular block, complete: Secondary | ICD-10-CM

## 2020-10-17 DIAGNOSIS — G253 Myoclonus: Secondary | ICD-10-CM

## 2020-10-17 DIAGNOSIS — H6122 Impacted cerumen, left ear: Secondary | ICD-10-CM | POA: Diagnosis not present

## 2020-10-17 DIAGNOSIS — K2100 Gastro-esophageal reflux disease with esophagitis, without bleeding: Secondary | ICD-10-CM

## 2020-10-17 DIAGNOSIS — I480 Paroxysmal atrial fibrillation: Secondary | ICD-10-CM

## 2020-10-17 NOTE — Progress Notes (Signed)
Location:   [facility] Room Number: 28 Place of Service:  ALF [phone]) Provider:  Windell Moulding, NP  [name], MD  Patient Care Team: [name], MD as PCP - General (Internal Medicine) Jettie Booze, MD as PCP - Cardiology (Cardiology) Thompson Grayer, MD as PCP - Electrophysiology (Cardiology) Blanch Media, MD as Consulting Physician (Neurology) Thompson Grayer, MD as Consulting Physician (Cardiology)  Extended Emergency Contact Information Primary Emergency Contact: Margaree Mackintosh Address: [place], [place] Phone: [phone] Relation: Spouse Secondary Emergency Contact: [name] Address: [address]          [place], [place] [address] [name] of [place] Phone: [phone] Relation: Daughter  Code Status:  DNR Goals of care: Advanced Directive information Advanced Directives 10/17/2020  Does Patient Have a Medical Advance Directive? Yes  Type of Paramedic of [place];Living will  Does patient want to make changes to medical advance directive? No - Patient declined  Copy of [place] in Chart? Yes - validated most recent copy scanned in chart (See row information)  Would patient like information on creating a medical advance directive? -     Chief Complaint  Patient presents with  . Medical Management of Chronic Issues    Routine follow up visit.  [name] Health Maintenance    Zoster and pneumonia vaccine    HPI:  Pt is a 87 y.o. female seen today for medical management of chronic diseases.    She currently resides in assisted living at [facility]. Past medical history includes: AV block 3rd degree, CHF, hypotension, atrial fibrillation, dysphagia, GERD, constipation, hypothyroidism, osteoporosis, depression, cognitive impairment, and myoclonic jerking.   Complete heart block s/p pacemaker PAF, rate  controlled without medication, coumadin 5 mg daily except Sunday. BLE, LLE > RLE, hx DVT LLE, torsemide 20 mg daily, potassium 46meq daily Myoclonic jerks, followed by neurology, remains on sertraline, gabapentin Hypothyroidism, TSH 2.19 03/31/2020, levothyroxine 50 mcg daily GERD, prilosec 20 mg daily Cognitive impairment, namenda 10 mg bid, no behavioral outbursts Osteoporosis, Prolia Q 6 months Depression, zoloft 25 mg daily Constipation, miralax daily  No recent falls, injuries or behavioral outbursts.   Recent blood pressures:   06/01- 117/63  05/25- 96/67  05/18- 112/60  Recent weights are as follows:  06/06- 135.2 lbs  05/04- 132.2 lbs  04/04- 129.2 lbs  03/02- 136.5 lbs  Nurse does not report any concerns, vitals stable.     Past Medical History:  Diagnosis Date  . Arthritis   . Atrial fibrillation ([place])   . Cataract    removed bilateral  . Chronic diastolic CHF (congestive heart failure) ([place]) 06/02/2020  . Clotting disorder ([place])    h/o blood clot left leg  . Dementia ([place])   . Depression   . DVT (deep venous thrombosis) ([place]) 2011  . GERD (gastroesophageal reflux disease)   . HB (heart block)    s/p PPM, most recent generator change 12/11 by JA  . Heart murmur   . Hiatal hernia   . Hyperlipidemia   . Hypertension   . Hypothyroidism   . Osteoporosis   . PTE (post-transplant erythrocytosis)   . Pulmonary embolism ([place]) 2011   on coumadin  . UTI (lower urinary tract infection)    Past Surgical History:  Procedure Laterality Date  . BLADDER SURGERY     twice  .  cataracts     bilateral  . CHOLECYSTECTOMY  09/2010  . COLONOSCOPY    . ESOPHAGOGASTRODUODENOSCOPY    . HEMORRHOID SURGERY    . hysterectomy (other)    . INNER EAR SURGERY    . JOINT REPLACEMENT    . pacemaker     most recent generator 12/11 by JA  . TOTAL KNEE ARTHROPLASTY Left     Allergies  Allergen Reactions  . Alendronate Other (See Comments)    Can not tolerate  . Celebrex  [Celecoxib] Other (See Comments)    Can not tolerate  . Meloxicam Other (See Comments)    Avoid   . Myrbetriq [Mirabegron] Other (See Comments)    Can not tolerate  . Norco [Hydrocodone-Acetaminophen] Other (See Comments)    Dizziness and jerking  . Pradaxa [Dabigatran Etexilate Mesylate] Other (See Comments)    Can not tolerate  . Pradaxa [Dabigatran]   . Toviaz [Fesoterodine Fumarate Er] Other (See Comments)    Can not tolerate  . Vesicare [Solifenacin] Other (See Comments)    Can not tolerate    Allergies as of 10/17/2020      Reactions   Alendronate Other (See Comments)   Can not tolerate   Celebrex [celecoxib] Other (See Comments)   Can not tolerate   Meloxicam Other (See Comments)   Avoid   Myrbetriq [mirabegron] Other (See Comments)   Can not tolerate   Norco [hydrocodone-acetaminophen] Other (See Comments)   Dizziness and jerking   Pradaxa [dabigatran Etexilate Mesylate] Other (See Comments)   Can not tolerate   Pradaxa [dabigatran]    Toviaz [fesoterodine Fumarate Er] Other (See Comments)   Can not tolerate   Vesicare [solifenacin] Other (See Comments)   Can not tolerate      Medication List       Accurate as of October 17, 2020  2:45 PM. If you have any questions, ask your nurse or doctor.        acetaminophen 325 MG tablet Commonly known as: TYLENOL Take 650 mg by mouth 2 (two) times daily.   Calcium Carb-Cholecalciferol 600-800 MG-UNIT Tabs Take 1 tablet by mouth daily.   cholecalciferol 1000 units tablet Commonly known as: VITAMIN D Take 1,000 Units by mouth daily.   denosumab 60 MG/ML Sosy injection Commonly known as: PROLIA Inject 60 mg into the skin. Once A Day on the 1st of Every 6th Month   gabapentin 300 MG capsule Commonly known as: NEURONTIN TAKE 1 CAPSULE TWICE DAILY   gabapentin 100 MG capsule Commonly known as: NEURONTIN Take 2 capsules (200 mg total) by mouth daily with lunch.   levothyroxine 50 MCG tablet Commonly known as:  SYNTHROID TAKE 1 TABLET (50 MCG TOTAL) BY MOUTH DAILY BEFORE BREAKFAST.   Lidocaine (Anorectal) 5 % Crea Commonly known as: RectiCare Insert into rectum 2 times a day and 2 more times as needed   loratadine 10 MG tablet Commonly known as: CLARITIN Take 10 mg by mouth daily.   memantine 10 MG tablet Commonly known as: NAMENDA Take 1 tablet (10 mg total) by mouth 2 (two) times daily.   omeprazole 20 MG capsule Commonly known as: PRILOSEC TAKE 1 CAPSULE TWICE DAILY   polyethylene glycol 17 g packet Commonly known as: MIRALAX / GLYCOLAX Take 17 g by mouth daily.   potassium chloride SA 20 MEQ tablet Commonly known as: KLOR-CON Take 20 mEq by mouth daily.   pravastatin 20 MG tablet Commonly known as: PRAVACHOL TAKE 1 TABLET EVERY DAY  sertraline 25 MG tablet Commonly known as: ZOLOFT Take 25 mg by mouth 2 (two) times daily.   torsemide 20 MG tablet Commonly known as: DEMADEX Take 20 mg by mouth daily.   warfarin 2.5 MG tablet Commonly known as: COUMADIN Take 2.5 mg by mouth. On sundays, 5mg  the rest of the days Monday-Saturday.   warfarin 5 MG tablet Commonly known as: COUMADIN Take 5 mg by mouth. 5 mg all days except Sunday.       Review of Systems  Constitutional: Negative for activity change, appetite change and fatigue.  HENT: Positive for hearing loss and trouble swallowing. Negative for dental problem.        Missing teeth, hearing aid  Eyes: Negative for visual disturbance.       Glasses  Respiratory: Negative for cough, shortness of breath and wheezing.   Cardiovascular: Positive for leg swelling. Negative for chest pain.  Gastrointestinal: Positive for constipation. Negative for abdominal distention, abdominal pain, diarrhea and nausea.  Genitourinary: Negative for dysuria, frequency and hematuria.  Musculoskeletal: Positive for arthralgias, gait problem and myalgias.  Skin:       Dry skin  Neurological: Positive for weakness and numbness. Negative  for dizziness and headaches.       Myoclonic jerks  Psychiatric/Behavioral: Positive for confusion and dysphoric mood. Negative for agitation and sleep disturbance. The patient is not nervous/anxious.     Immunization History  Administered Date(s) Administered  . IPV 03/06/2016, 09/27/2016, 10/09/2017, 04/16/2018, 11/03/2018, 07/14/2019, 01/19/2020  . Influenza Whole 01/17/2011  . Influenza, High Dose Seasonal PF 02/24/2014, 01/05/2015, 01/09/2016, 01/10/2017, 02/25/2019  . Influenza,inj,Quad PF,6+ Mos 02/13/2018  . Influenza-Unspecified 02/11/2012, 02/14/2013, 02/24/2014, 02/02/2020  . Moderna Sars-Covid-2 Vaccination 05/18/2019, 06/15/2019, 03/28/2020  . Pneumococcal Conjugate-13 06/30/2014, 03/22/2018  . Pneumococcal Polysaccharide-23 02/17/2009, 01/09/2012  . Pneumococcal-Unspecified 06/30/2008, 02/17/2009  . Td 03/10/2012  . Tdap 02/12/2012  . Zoster, Live 05/14/2008   Pertinent  Health Maintenance Due  Topic Date Due  . INFLUENZA VACCINE  12/12/2020  . PNA vac Low Risk Adult  Completed  . DEXA SCAN  Discontinued   Fall Risk  07/05/2020 06/29/2020 06/08/2020 05/25/2020 04/20/2020  Falls in the past year? 0 0 0 0 0  Number falls in past yr: 0 0 0 0 0  Injury with Fall? - - - - -   Functional Status Survey:    Vitals:   10/17/20 1245  BP: 117/63  Pulse: 77  Temp: (!) 97.5 F (36.4 C)  SpO2: 95%  Weight: 135 lb 3.2 oz (61.3 kg)  Height: 5' (1.524 m)   Body mass index is 26.4 kg/m. Physical Exam Vitals reviewed.  Constitutional:      General: She is not in acute distress. HENT:     Head: Normocephalic.     Right Ear: There is no impacted cerumen.     Left Ear: There is impacted cerumen.     Nose: Nose normal.     Mouth/Throat:     Mouth: Mucous membranes are moist.     Pharynx: No posterior oropharyngeal erythema.  Eyes:     General:        Right eye: No discharge.        Left eye: No discharge.  Neck:     Thyroid: No thyroid mass or thyromegaly.   Cardiovascular:     Rate and Rhythm: Normal rate. Rhythm irregular.     Pulses: Normal pulses.     Heart sounds: Murmur heard.    Pulmonary:  Effort: Pulmonary effort is normal. No respiratory distress.     Breath sounds: Normal breath sounds. No wheezing.  Abdominal:     General: Bowel sounds are normal. There is no distension.     Palpations: Abdomen is soft.     Tenderness: There is no abdominal tenderness.  Musculoskeletal:     Cervical back: Normal range of motion.     Right lower leg: Edema present.     Left lower leg: Edema present.     Comments: LLE 2+ pitting, RLE 1+ pitting  Lymphadenopathy:     Cervical: No cervical adenopathy.  Skin:    General: Skin is warm and dry.     Capillary Refill: Capillary refill takes less than 2 seconds.  Neurological:     General: No focal deficit present.     Mental Status: She is alert. Mental status is at baseline.     Motor: Weakness present.     Gait: Gait abnormal.     Comments: rollator  Psychiatric:        Mood and Affect: Mood normal.        Behavior: Behavior normal.        Cognition and Memory: Memory is impaired.     Labs reviewed: Recent Labs    06/04/20 0336 06/05/20 0426 06/06/20 0427 07/21/20 0000  NA 140 138 140 144  K 3.6 4.2 4.1 3.5  CL 107 106 108 96*  CO2 25 27 24  35*  GLUCOSE 149* 145* 153*  --   BUN 28* 26* 30* 30*  CREATININE 0.67 0.80 0.76 1.1  CALCIUM 8.9 9.1 9.1 9.8  MG 2.0 2.1 2.2  --    Recent Labs    06/04/20 0336 06/05/20 0426 06/06/20 0427  AST 17 16 15   ALT 16 16 20   ALKPHOS 62 63 56  BILITOT 0.6 0.4 0.2*  PROT 6.1* 5.8* 5.5*  ALBUMIN 3.3* 3.1* 3.0*   Recent Labs    06/04/20 0336 06/05/20 0426 06/06/20 0427  WBC 10.3 10.6* 10.0  NEUTROABS 8.3* 9.3* 8.8*  HGB 12.1 12.0 12.2  HCT 39.2 38.5 37.9  MCV 93.6 94.1 90.9  PLT 171 169 160   Lab Results  Component Value Date   TSH 3.42 04/25/2020   No results found for: HGBA1C Lab Results  Component Value Date   CHOL  158 10/15/2019   HDL 60 10/15/2019   LDLCALC 78 10/15/2019   TRIG 76 06/03/2020   CHOLHDL 2.6 10/15/2019    Significant Diagnostic Results in last 30 days:  No results found.  Assessment/Plan 1. Impacted cerumen of left ear - cannot visualize TM - debrox 5 gtts bid x 7 days, then flush ear with warm water when complete  2. ATRIOVENTRICULAR BLOCK, 3RD DEGREE - asymptomatic - s/p pacemaker - cbc/diff - cmp  3. Paroxysmal atrial fibrillation (HCC) - rate controlled without medication - cont coumadin 5 mg daily except Sunday for clot prevention  4. Edema of both legs - LLE worse than RLE, pitting edema - cont torsemide 20 mg daily - cont potassium 20 meq daily - encourage leg elevation 2-4 hours daily - refuses to wear compression stockings due to pain -cont low sodium diet  5. Hypothyroidism in adult - TSH 2.19 03/31/2020 - cont levothyroxine 50 mcg daily - TSH- today  6. Cognitive impairment - no recent behavioral outbursts - cont namenda  7. Slow transit constipation - stable with daily miralax  8. Chronic diastolic CHF (congestive heart failure) (HCC) - no recent  weight fluctuations, sob, some BLE - cont torsemide 20 mg daily - cont potassium 20 meq daily  9. Myoclonic jerking - followed by neuro - cont sertraline and gabapentin regimen  10. Gastroesophageal reflux disease with esophagitis without hemorrhage - stable with prilosec - cont to avoid food triggers  11. Age-related osteoporosis without current pathological fracture - cont Prolia q 6 months - cont falls safety plan  12. Depression, recurrent ([place]) - stable with sertraline    Family/ staff Communication: plan discussed with patient and nurse  Labs/tests ordered:  Cbc/diff, cmp, TSH

## 2020-10-19 DIAGNOSIS — Z23 Encounter for immunization: Secondary | ICD-10-CM | POA: Diagnosis not present

## 2020-10-20 DIAGNOSIS — E039 Hypothyroidism, unspecified: Secondary | ICD-10-CM | POA: Diagnosis not present

## 2020-10-20 DIAGNOSIS — I5032 Chronic diastolic (congestive) heart failure: Secondary | ICD-10-CM | POA: Diagnosis not present

## 2020-10-20 LAB — CBC AND DIFFERENTIAL
HCT: 35 — AB (ref 36–46)
Hemoglobin: 11 — AB (ref 12.0–16.0)
Neutrophils Absolute: 3837
Platelets: 180 (ref 150–399)
WBC: 6.3

## 2020-10-20 LAB — BASIC METABOLIC PANEL
BUN: 32 — AB (ref 4–21)
CO2: 29 — AB (ref 13–22)
Chloride: 106 (ref 99–108)
Creatinine: 0.9 (ref 0.5–1.1)
Glucose: 87
Potassium: 3.9 (ref 3.4–5.3)
Sodium: 144 (ref 137–147)

## 2020-10-20 LAB — COMPREHENSIVE METABOLIC PANEL
Albumin: 4 (ref 3.5–5.0)
Calcium: 10 (ref 8.7–10.7)
Globulin: 2.1

## 2020-10-20 LAB — CBC: RBC: 3.88 (ref 3.87–5.11)

## 2020-10-20 LAB — HEPATIC FUNCTION PANEL
ALT: 7 (ref 7–35)
AST: 12 — AB (ref 13–35)
Alkaline Phosphatase: 63 (ref 25–125)
Bilirubin, Total: 0.4

## 2020-10-20 LAB — TSH: TSH: 1.22 (ref 0.41–5.90)

## 2020-11-07 DIAGNOSIS — M7060 Trochanteric bursitis, unspecified hip: Secondary | ICD-10-CM | POA: Diagnosis not present

## 2020-11-07 DIAGNOSIS — M1990 Unspecified osteoarthritis, unspecified site: Secondary | ICD-10-CM | POA: Diagnosis not present

## 2020-11-07 DIAGNOSIS — M810 Age-related osteoporosis without current pathological fracture: Secondary | ICD-10-CM | POA: Diagnosis not present

## 2020-11-07 DIAGNOSIS — M25512 Pain in left shoulder: Secondary | ICD-10-CM | POA: Diagnosis not present

## 2020-11-07 DIAGNOSIS — M7582 Other shoulder lesions, left shoulder: Secondary | ICD-10-CM | POA: Diagnosis not present

## 2020-11-07 DIAGNOSIS — M25559 Pain in unspecified hip: Secondary | ICD-10-CM | POA: Diagnosis not present

## 2020-11-21 ENCOUNTER — Encounter: Payer: Self-pay | Admitting: Nurse Practitioner

## 2020-11-21 ENCOUNTER — Non-Acute Institutional Stay: Payer: Medicare Other | Admitting: Nurse Practitioner

## 2020-11-21 DIAGNOSIS — R4189 Other symptoms and signs involving cognitive functions and awareness: Secondary | ICD-10-CM | POA: Diagnosis not present

## 2020-11-21 DIAGNOSIS — R600 Localized edema: Secondary | ICD-10-CM

## 2020-11-21 DIAGNOSIS — K5901 Slow transit constipation: Secondary | ICD-10-CM | POA: Diagnosis not present

## 2020-11-21 DIAGNOSIS — F339 Major depressive disorder, recurrent, unspecified: Secondary | ICD-10-CM

## 2020-11-21 DIAGNOSIS — R0989 Other specified symptoms and signs involving the circulatory and respiratory systems: Secondary | ICD-10-CM | POA: Diagnosis not present

## 2020-11-21 DIAGNOSIS — R259 Unspecified abnormal involuntary movements: Secondary | ICD-10-CM | POA: Diagnosis not present

## 2020-11-21 DIAGNOSIS — M810 Age-related osteoporosis without current pathological fracture: Secondary | ICD-10-CM | POA: Diagnosis not present

## 2020-11-21 DIAGNOSIS — E039 Hypothyroidism, unspecified: Secondary | ICD-10-CM | POA: Diagnosis not present

## 2020-11-21 DIAGNOSIS — Z950 Presence of cardiac pacemaker: Secondary | ICD-10-CM | POA: Diagnosis not present

## 2020-11-21 DIAGNOSIS — I480 Paroxysmal atrial fibrillation: Secondary | ICD-10-CM | POA: Diagnosis not present

## 2020-11-21 DIAGNOSIS — N3946 Mixed incontinence: Secondary | ICD-10-CM | POA: Diagnosis not present

## 2020-11-21 DIAGNOSIS — K2100 Gastro-esophageal reflux disease with esophagitis, without bleeding: Secondary | ICD-10-CM

## 2020-11-21 NOTE — Assessment & Plan Note (Signed)
Sore throat, headache, afebrile, denied cough, congestion, chest pain/pressure, or palpitation. Will increase Tylenol 678m tid, lozenges, update CBC/diff, CMP/eGFR

## 2020-11-21 NOTE — Progress Notes (Signed)
Location:   [facility] Room Number: ZO10 Place of Service:  ALF (13) Provider: Lennie Odor Lamine Laton NP  [name], MD  Patient Care Team: [name], MD as PCP - General (Internal Medicine) Jettie Booze, MD as PCP - Cardiology (Cardiology) Thompson Grayer, MD as PCP - Electrophysiology (Cardiology) Blanch Media, MD as Consulting Physician (Neurology) Thompson Grayer, MD as Consulting Physician (Cardiology)  Extended Emergency Contact Information Primary Emergency Contact: Margaree Mackintosh Address: [place], [place] Phone: [phone] Relation: Spouse Secondary Emergency Contact: [name] Address: [address]          [place], [place] [address] [name] Phone: [phone] Relation: Daughter  Code Status:  DNR Goals of care: Advanced Directive information Advanced Directives 11/21/2020  Does Patient Have a Medical Advance Directive? Yes  Type of Paramedic of [place];Living will  Does patient want to make changes to medical advance directive? No - Patient declined  Copy of [place] in Chart? Yes - validated most recent copy scanned in chart (See row information)  Would patient like information on creating a medical advance directive? -     Chief Complaint  Patient presents with   Acute Visit    Patient complains of sore throat and headache.     HPI:  Pt is a 87 y.o. female seen today for an acute visit for Sore throat, headache, afebrile, denied cough, congestion, chest pain/pressure, or palpitation, no noted focal weakness.     Depression, takes Sertraline.              Chronic swelling in legs, LLE >RLE slightly, Hx of DVT LLE. Saw Cardiology. better on Torsemide.  Last echoc normal EF 2016. Bun/creat 32/0.87 eGFR 57             Hypothyroidism, takes Levothyroxine, TH 1.22 10/20/20             Afib, on Coumadin, Hgb  11 10/20/20  AVB 3 degree, pacemaker.              Myoclonic jerking functional, s/p neurology evaluation, takes Sertraline, Gabapentin.              Age related OP w/o current pathological fracture, taking Prolia per Rheumatology             Cognitive impairment: off Donepezil, takes Memantine. resides in [place].             Chronic constipation, takes MiraLax.              Urinary incontinence, didn't tolerated Myrbetriq, declined Urology eval.             AS, pacemaker, Hx of DVT on Coumadin, f/u Cardiology             GERD, taking Omeprazole. Hgb 11 10/20/20     Past Medical History:  Diagnosis Date   Arthritis    Atrial fibrillation (HCC)    Cataract    removed bilateral   Chronic diastolic CHF (congestive heart failure) ([facility]) 06/02/2020   Clotting disorder ([facility])    h/o blood clot left leg   Dementia (HCC)    Depression    DVT (deep venous thrombosis) ([facility]) 2011   GERD (gastroesophageal reflux disease)    HB (heart block)    s/p PPM, most recent generator change 12/11 by Greggory Brandy  Heart murmur    Hiatal hernia    Hyperlipidemia    Hypertension    Hypothyroidism    Osteoporosis    PTE (post-transplant erythrocytosis)    Pulmonary embolism ([facility]) 2011   on coumadin   UTI (lower urinary tract infection)    Past Surgical History:  Procedure Laterality Date   BLADDER SURGERY     twice   cataracts     bilateral   CHOLECYSTECTOMY  09/2010   COLONOSCOPY     ESOPHAGOGASTRODUODENOSCOPY     HEMORRHOID SURGERY     hysterectomy (other)     INNER EAR SURGERY     JOINT REPLACEMENT     pacemaker     most recent generator 12/11 by JA   TOTAL KNEE ARTHROPLASTY Left     Allergies  Allergen Reactions   Alendronate Other (See Comments)    Can not tolerate   Celebrex [Celecoxib] Other (See Comments)    Can not tolerate   Meloxicam Other (See Comments)    Avoid    Myrbetriq [Mirabegron] Other (See Comments)    Can not tolerate   Norco [Hydrocodone-Acetaminophen] Other (See  Comments)    Dizziness and jerking   Pradaxa [Dabigatran Etexilate Mesylate] Other (See Comments)    Can not tolerate   Pradaxa [Dabigatran]    Toviaz [Fesoterodine Fumarate Er] Other (See Comments)    Can not tolerate   Vesicare [Solifenacin] Other (See Comments)    Can not tolerate    Allergies as of 11/21/2020       Reactions   Alendronate Other (See Comments)   Can not tolerate   Celebrex [celecoxib] Other (See Comments)   Can not tolerate   Meloxicam Other (See Comments)   Avoid   Myrbetriq [mirabegron] Other (See Comments)   Can not tolerate   Norco [hydrocodone-acetaminophen] Other (See Comments)   Dizziness and jerking   Pradaxa [dabigatran Etexilate Mesylate] Other (See Comments)   Can not tolerate   Pradaxa [dabigatran]    Toviaz [fesoterodine Fumarate Er] Other (See Comments)   Can not tolerate   Vesicare [solifenacin] Other (See Comments)   Can not tolerate        Medication List        Accurate as of November 21, 2020 11:59 PM. If you have any questions, ask your nurse or doctor.          acetaminophen 325 MG tablet Commonly known as: TYLENOL Take 650 mg by mouth 2 (two) times daily.   Calcium Carb-Cholecalciferol 600-800 MG-UNIT Tabs Take 1 tablet by mouth daily.   cholecalciferol 1000 units tablet Commonly known as: VITAMIN D Take 1,000 Units by mouth daily.   denosumab 60 MG/ML Sosy injection Commonly known as: PROLIA Inject 60 mg into the skin. Once A Day on the 1st of Every 6th Month   gabapentin 300 MG capsule Commonly known as: NEURONTIN TAKE 1 CAPSULE TWICE DAILY   gabapentin 100 MG capsule Commonly known as: NEURONTIN Take 2 capsules (200 mg total) by mouth daily with lunch.   levothyroxine 50 MCG tablet Commonly known as: SYNTHROID TAKE 1 TABLET (50 MCG TOTAL) BY MOUTH DAILY BEFORE BREAKFAST.   loratadine 10 MG tablet Commonly known as: CLARITIN Take 10 mg by mouth daily.   meclizine 25 MG tablet Commonly known as:  ANTIVERT Take 25 mg by mouth 3 (three) times daily as needed for dizziness.   memantine 10 MG tablet Commonly known as: NAMENDA Take 1 tablet (10 mg total) by mouth 2 (  two) times daily.   omeprazole 20 MG capsule Commonly known as: PRILOSEC TAKE 1 CAPSULE TWICE DAILY   polyethylene glycol 17 g packet Commonly known as: MIRALAX / GLYCOLAX Take 17 g by mouth daily.   potassium chloride SA 20 MEQ tablet Commonly known as: KLOR-CON Take 20 mEq by mouth daily.   pravastatin 20 MG tablet Commonly known as: PRAVACHOL TAKE 1 TABLET EVERY DAY   PreserVision AREDS 2 Caps Take 1 capsule by mouth 2 (two) times daily.   sertraline 25 MG tablet Commonly known as: ZOLOFT Take 25 mg by mouth 2 (two) times daily.   torsemide 20 MG tablet Commonly known as: DEMADEX Take 20 mg by mouth daily.   warfarin 5 MG tablet Commonly known as: COUMADIN Take 5 mg by mouth. 5 mg all days except Sunday.   warfarin 6 MG tablet Commonly known as: COUMADIN Take 6 mg by mouth. 6MG, oral, Once A Day on Mon, Fri, warfrin 8m everyday EXCEPT MONDAY AND FRIDAYS warfrin 654m        Review of Systems  Constitutional:  Negative for activity change, appetite change and fever.  HENT:  Positive for hearing loss and sore throat. Negative for congestion, dental problem, ear pain, facial swelling, mouth sores, postnasal drip, rhinorrhea, sinus pressure, sinus pain, trouble swallowing and voice change.   Eyes:  Negative for visual disturbance.       Low vision  Respiratory:  Negative for cough, shortness of breath and wheezing.   Cardiovascular:  Positive for leg swelling.  Gastrointestinal:  Positive for constipation. Negative for abdominal pain and nausea.  Genitourinary:  Positive for frequency and urgency. Negative for dysuria.       Incontinent of urine, frequency, urgency at times.   Musculoskeletal:  Positive for gait problem.  Skin:        Mild erythema BLE  Neurological:  Positive for tremors and  headaches. Negative for facial asymmetry, speech difficulty and weakness.       Memory loss. Jerking symptoms.   Psychiatric/Behavioral:  Negative for confusion and sleep disturbance. The patient is not nervous/anxious.    Immunization History  Administered Date(s) Administered   IPV 03/06/2016, 09/27/2016, 10/09/2017, 04/16/2018, 11/03/2018, 07/14/2019, 01/19/2020   Influenza Whole 01/17/2011   Influenza, High Dose Seasonal PF 02/24/2014, 01/05/2015, 01/09/2016, 01/10/2017, 02/25/2019   Influenza,inj,Quad PF,6+ Mos 02/13/2018   Influenza-Unspecified 02/11/2012, 02/14/2013, 02/24/2014, 02/02/2020   Moderna SARS-COV2 Booster Vaccination 10/19/2020   Moderna Sars-Covid-2 Vaccination 05/18/2019, 06/15/2019, 03/28/2020   Pneumococcal Conjugate-13 06/30/2014, 03/22/2018   Pneumococcal Polysaccharide-23 02/17/2009, 01/09/2012   Pneumococcal-Unspecified 06/30/2008, 02/17/2009   Td 03/10/2012   Tdap 02/12/2012   Zoster, Live 05/14/2008   Pertinent  Health Maintenance Due  Topic Date Due   INFLUENZA VACCINE  12/12/2020   PNA vac Low Risk Adult  Completed   DEXA SCAN  Discontinued   Fall Risk  07/05/2020 06/29/2020 06/08/2020 05/25/2020 04/20/2020  Falls in the past year? 0 0 0 0 0  Number falls in past yr: 0 0 0 0 0  Injury with Fall? - - - - -   Functional Status Survey:    Vitals:   11/21/20 1319  BP: 110/68  Pulse: 82  Resp: 20  Temp: (!) 97.2 F (36.2 C)  SpO2: 94%  Weight: 128 lb 6.4 oz (58.2 kg)  Height: 5' (1.524 m)   Body mass index is 25.08 kg/m. Physical Exam Vitals reviewed.  Constitutional:      General: She is not in acute distress.  Appearance: Normal appearance. She is not ill-appearing or toxic-appearing.  HENT:     Head: Normocephalic and atraumatic.     Nose: Nose normal.     Mouth/Throat:     Mouth: Mucous membranes are moist.     Pharynx: No oropharyngeal exudate or posterior oropharyngeal erythema.  Eyes:     Extraocular Movements: Extraocular  movements intact.     Conjunctiva/sclera: Conjunctivae normal.     Pupils: Pupils are equal, round, and reactive to light.  Cardiovascular:     Rate and Rhythm: Normal rate and regular rhythm.     Heart sounds: Murmur heard.     Comments: Pacer. DP pulses present R+L Pulmonary:     Effort: Pulmonary effort is normal.     Breath sounds: No rales.  Abdominal:     General: Bowel sounds are normal.     Tenderness: There is no abdominal tenderness.  Musculoskeletal:     Cervical back: Normal range of motion and neck supple.     Right lower leg: Edema present.     Left lower leg: Edema present.     Comments: 1+ edema BLE  Skin:    General: Skin is warm and dry.  Neurological:     General: No focal deficit present.     Mental Status: She is alert. Mental status is at baseline.     Motor: No weakness.     Coordination: Coordination normal.     Gait: Gait abnormal.     Comments: Oriented to person, place. Not sure about time.   Psychiatric:        Mood and Affect: Mood normal.        Behavior: Behavior normal.        Thought Content: Thought content normal.        Judgment: Judgment normal.    Labs reviewed: Recent Labs    06/04/20 0336 06/05/20 0426 06/06/20 0427 07/21/20 0000  NA 140 138 140 144  K 3.6 4.2 4.1 3.5  CL 107 106 108 96*  CO2 _0 35*  GLUCOSE 149* 145* 153*  --   BUN 28* 26* 30* 30*  CREATININE 0.67 0.80 0.76 1.1  CALCIUM 8.9 9.1 9.1 9.8  MG 2.0 2.1 2.2  --    Recent Labs    06/04/20 0336 06/05/20 0426 06/06/20 0427  AST _1 ALT _2 ALKPHOS 62 63 56  BILITOT 0.6 0.4 0.2*  PROT 6.1* 5.8* 5.5*  ALBUMIN 3.3* 3.1* 3.0*   Recent Labs    06/04/20 0336 06/05/20 0426 06/06/20 0427  WBC 10.3 10.6* 10.0  NEUTROABS 8.3* 9.3* 8.8*  HGB 12.1 12.0 12.2  HCT 39.2 38.5 37.9  MCV 93.6 94.1 90.9  PLT 171 169 160   Lab Results  Component Value Date   TSH 3.42 04/25/2020   No results found for: HGBA1C Lab Results  Component Value  Date   CHOL 158 10/15/2019   HDL 60 10/15/2019   LDLCALC 78 10/15/2019   TRIG 76 06/03/2020   CHOLHDL 2.6 10/15/2019    Significant Diagnostic Results in last 30 days:  No results found.  Assessment/Plan Symptoms of upper respiratory infection (URI) Sore throat, headache, afebrile, denied cough, congestion, chest pain/pressure, or palpitation. Will increase Tylenol 62m tid, lozenges, update CBC/diff, CMP/eGFR  Depression, recurrent (HCC) Her mood is stable, continue Sertraline.   Edema of both legs Chronic swelling in legs, LLE >RLE slightly, Hx of DVT LLE. Saw Cardiology. better on  Torsemide.  Last echoc normal EF 2016. Bun/creat 32/0.87 eGFR 57  Hypothyroidism in adult  takes Levothyroxine, TH 1.22 10/20/20  PAROXYSMAL ATRIAL FIBRILLATION on Coumadin, Hgb 11 10/20/20  Pacemaker-[employer] AVB 3  Involuntary movements Myoclonic jerking functional, s/p neurology evaluation, takes Sertraline, Gabapentin.   Age related osteoporosis Age related OP w/o current pathological fracture, taking Prolia per Rheumatology  Cognitive impairment Cognitive impairment: off Donepezil, takes Memantine. resides in [place].  Slow transit constipation Stable, continue MiraLax.   Mixed incontinence urge and stress Urinary incontinence, didn't tolerated Myrbetriq, declined Urology eval.  GERD (gastroesophageal reflux disease) Stable, continue Omeprazole.     Family/ staff Communication: plan of care reviewed with the patient and charge nurse.   Labs/tests ordered:  CBC/diff, CMP/eGFR  Time spend 40 minutes.

## 2020-11-22 ENCOUNTER — Encounter: Payer: Self-pay | Admitting: Nurse Practitioner

## 2020-11-22 NOTE — Assessment & Plan Note (Signed)
Chronic swelling in legs, LLE >RLE slightly, Hx of DVT LLE. Saw Cardiology. better on Torsemide.  Last echoc normal EF 2016. Bun/creat 32/0.87 eGFR 57

## 2020-11-22 NOTE — Assessment & Plan Note (Signed)
AVB 3

## 2020-11-22 NOTE — Assessment & Plan Note (Signed)
takes Levothyroxine, TH 1.22 10/20/20

## 2020-11-22 NOTE — Assessment & Plan Note (Signed)
Age related OP w/o current pathological fracture, taking Prolia per Rheumatology

## 2020-11-22 NOTE — Assessment & Plan Note (Signed)
Cognitive impairment: off Donepezil, takes Memantine. resides in [place].

## 2020-11-22 NOTE — Assessment & Plan Note (Signed)
Her mood is stable, continue Sertraline.  

## 2020-11-22 NOTE — Assessment & Plan Note (Signed)
Stable, continue Omeprazole.  

## 2020-11-22 NOTE — Assessment & Plan Note (Signed)
on Coumadin, Hgb 11 10/20/20

## 2020-11-22 NOTE — Assessment & Plan Note (Signed)
Myoclonic jerking functional, s/p neurology evaluation, takes Sertraline, Gabapentin.

## 2020-11-22 NOTE — Assessment & Plan Note (Signed)
Stable, continue MiraLax.  °

## 2020-11-22 NOTE — Assessment & Plan Note (Signed)
Urinary incontinence, didn't tolerated Myrbetriq, declined Urology eval.

## 2020-11-24 DIAGNOSIS — I5032 Chronic diastolic (congestive) heart failure: Secondary | ICD-10-CM | POA: Diagnosis not present

## 2020-11-24 LAB — BASIC METABOLIC PANEL
BUN: 39 — AB (ref 4–21)
CO2: 32 — AB (ref 13–22)
Chloride: 105 (ref 99–108)
Creatinine: 1.1 (ref 0.5–1.1)
Glucose: 82
Potassium: 4 (ref 3.4–5.3)
Sodium: 144 (ref 137–147)

## 2020-11-24 LAB — CBC: RBC: 4.35 (ref 3.87–5.11)

## 2020-11-24 LAB — CBC AND DIFFERENTIAL
HCT: 39 (ref 36–46)
Hemoglobin: 12.7 (ref 12.0–16.0)
Neutrophils Absolute: 3952
Platelets: 194 (ref 150–399)
WBC: 7.6

## 2020-11-24 LAB — COMPREHENSIVE METABOLIC PANEL
Albumin: 4.1 (ref 3.5–5.0)
Calcium: 9.7 (ref 8.7–10.7)
Globulin: 2.4

## 2020-11-24 LAB — HEPATIC FUNCTION PANEL
ALT: 15 (ref 7–35)
AST: 15 (ref 13–35)
Alkaline Phosphatase: 67 (ref 25–125)
Bilirubin, Total: 0.4

## 2020-12-01 ENCOUNTER — Ambulatory Visit (INDEPENDENT_AMBULATORY_CARE_PROVIDER_SITE_OTHER): Payer: Medicare Other

## 2020-12-01 DIAGNOSIS — I442 Atrioventricular block, complete: Secondary | ICD-10-CM | POA: Diagnosis not present

## 2020-12-05 LAB — CUP PACEART REMOTE DEVICE CHECK
Battery Impedance: 1971 Ohm
Battery Remaining Longevity: 36 mo
Battery Voltage: 2.76 V
Brady Statistic AP VP Percent: 2 %
Brady Statistic AP VS Percent: 0 %
Brady Statistic AS VP Percent: 98 %
Brady Statistic AS VS Percent: 0 %
Date Time Interrogation Session: 20220723180802
Implantable Lead Implant Date: 19980518
Implantable Lead Implant Date: 20040301
Implantable Lead Location: 753859
Implantable Lead Location: 753860
Implantable Lead Model: 4269
Implantable Lead Model: 4285
Implantable Lead Serial Number: 249440
Implantable Lead Serial Number: 295540
Implantable Pulse Generator Implant Date: 20111206
Lead Channel Impedance Value: 615 Ohm
Lead Channel Impedance Value: 871 Ohm
Lead Channel Pacing Threshold Amplitude: 1.25 V
Lead Channel Pacing Threshold Pulse Width: 0.4 ms
Lead Channel Setting Pacing Amplitude: 2 V
Lead Channel Setting Pacing Amplitude: 2.5 V
Lead Channel Setting Pacing Pulse Width: 0.4 ms
Lead Channel Setting Sensing Sensitivity: 5.6 mV

## 2020-12-23 NOTE — Progress Notes (Signed)
Remote pacemaker transmission.   

## 2021-01-12 ENCOUNTER — Encounter: Payer: Self-pay | Admitting: Internal Medicine

## 2021-01-12 ENCOUNTER — Non-Acute Institutional Stay: Payer: Medicare Other | Admitting: Internal Medicine

## 2021-01-12 DIAGNOSIS — M810 Age-related osteoporosis without current pathological fracture: Secondary | ICD-10-CM

## 2021-01-12 DIAGNOSIS — I480 Paroxysmal atrial fibrillation: Secondary | ICD-10-CM | POA: Diagnosis not present

## 2021-01-12 DIAGNOSIS — G253 Myoclonus: Secondary | ICD-10-CM

## 2021-01-12 DIAGNOSIS — R4189 Other symptoms and signs involving cognitive functions and awareness: Secondary | ICD-10-CM

## 2021-01-12 DIAGNOSIS — E039 Hypothyroidism, unspecified: Secondary | ICD-10-CM

## 2021-01-12 DIAGNOSIS — R197 Diarrhea, unspecified: Secondary | ICD-10-CM

## 2021-01-12 DIAGNOSIS — N3946 Mixed incontinence: Secondary | ICD-10-CM | POA: Diagnosis not present

## 2021-01-12 DIAGNOSIS — R600 Localized edema: Secondary | ICD-10-CM | POA: Diagnosis not present

## 2021-01-12 NOTE — Progress Notes (Signed)
Location:   [facility] Number: 38 Place of Service:  ALF [phone]) Provider:  Veleta Miners MD   [name], MD  Patient Care Team: [name], MD as PCP - General (Internal Medicine) Jettie Booze, MD as PCP - Cardiology (Cardiology) Thompson Grayer, MD as PCP - Electrophysiology (Cardiology) Blanch Media, MD as Consulting Physician (Neurology) Thompson Grayer, MD as Consulting Physician (Cardiology)  Extended Emergency Contact Information Primary Emergency Contact: Margaree Mackintosh Address: [place], [place] Phone: [phone] Relation: Spouse Secondary Emergency Contact: [name] Address: [address]          [place], [place] [address] [name] of [place] Phone: [phone] Relation: Daughter  Code Status:  DNR Goals of care: Advanced Directive information Advanced Directives 01/12/2021  Does Patient Have a Medical Advance Directive? Yes  Type of Paramedic of [place];Living will  Does patient want to make changes to medical advance directive? No - Patient declined  Copy of [place] in Chart? Yes - validated most recent copy scanned in chart (See row information)  Would patient like information on creating a medical advance directive? -     Chief Complaint  Patient presents with   Medical Management of Chronic Issues   Quality Metric Gaps    Shingrix, Flu shot    HPI:  Pt is a 87 y.o. female seen today for medical management of chronic diseases.    Patient has a history of CHB s/p pacemaker,  PAF on Coumadin, moderate AS Also has history of cognitive impairment, myoclonic jerks thought to be functional on Zoloft Osteoporosis on Prolia per rheumatology Urinary incontinence Chronic diarrhea followed by GI H/o Covid Pneumonia  [place]ives in [place] Recent loss of her husband but managing well Walks with her  walker Weight is stable Her only complain was chronic Pain in her Left shoulder and Hips Mood seems much better Past Medical History:  Diagnosis Date   Arthritis    Atrial fibrillation ([place])    Cataract    removed bilateral   Chronic diastolic CHF (congestive heart failure) ([place]) 06/02/2020   Clotting disorder ([place])    h/o blood clot left leg   Dementia (HCC)    Depression    DVT (deep venous thrombosis) ([place]) 2011   GERD (gastroesophageal reflux disease)    HB (heart block)    s/p PPM, most recent generator change 12/11 by JA   Heart murmur    Hiatal hernia    Hyperlipidemia    Hypertension    Hypothyroidism    Osteoporosis    PTE (post-transplant erythrocytosis)    Pulmonary embolism ([place]) 2011   on coumadin   UTI (lower urinary tract infection)    Past Surgical History:  Procedure Laterality Date   BLADDER SURGERY     twice   cataracts     bilateral   CHOLECYSTECTOMY  09/2010   COLONOSCOPY     ESOPHAGOGASTRODUODENOSCOPY     HEMORRHOID SURGERY     hysterectomy (other)     INNER EAR SURGERY     JOINT REPLACEMENT     pacemaker     most recent generator 12/11 by JA   TOTAL KNEE ARTHROPLASTY Left     Allergies  Allergen Reactions   Alendronate Other (See Comments)    Can not tolerate   Celebrex [Celecoxib] Other (See Comments)  Can not tolerate   Meloxicam Other (See Comments)    Avoid    Myrbetriq [Mirabegron] Other (See Comments)    Can not tolerate   Norco [Hydrocodone-Acetaminophen] Other (See Comments)    Dizziness and jerking   Pradaxa [Dabigatran Etexilate Mesylate] Other (See Comments)    Can not tolerate   Pradaxa [Dabigatran]    Toviaz [Fesoterodine Fumarate Er] Other (See Comments)    Can not tolerate   Vesicare [Solifenacin] Other (See Comments)    Can not tolerate    Allergies as of 01/12/2021       Reactions   Alendronate Other (See Comments)   Can not tolerate   Celebrex [celecoxib] Other (See Comments)   Can not tolerate    Meloxicam Other (See Comments)   Avoid   Myrbetriq [mirabegron] Other (See Comments)   Can not tolerate   Norco [hydrocodone-acetaminophen] Other (See Comments)   Dizziness and jerking   Pradaxa [dabigatran Etexilate Mesylate] Other (See Comments)   Can not tolerate   Pradaxa [dabigatran]    Toviaz [fesoterodine Fumarate Er] Other (See Comments)   Can not tolerate   Vesicare [solifenacin] Other (See Comments)   Can not tolerate        Medication List        Accurate as of January 12, 2021  9:52 AM. If you have any questions, ask your nurse or doctor.          acetaminophen 325 MG tablet Commonly known as: TYLENOL Take 650 mg by mouth 3 (three) times daily.   Calcium Carb-Cholecalciferol 600-800 MG-UNIT Tabs Take 1 tablet by mouth daily.   cholecalciferol 1000 units tablet Commonly known as: VITAMIN D Take 1,000 Units by mouth daily.   denosumab 60 MG/ML Sosy injection Commonly known as: PROLIA Inject 60 mg into the skin. Once A Day on the 1st of Every 6th Month   gabapentin 300 MG capsule Commonly known as: NEURONTIN TAKE 1 CAPSULE TWICE DAILY   gabapentin 100 MG capsule Commonly known as: NEURONTIN Take 2 capsules (200 mg total) by mouth daily with lunch.   levothyroxine 50 MCG tablet Commonly known as: SYNTHROID TAKE 1 TABLET (50 MCG TOTAL) BY MOUTH DAILY BEFORE BREAKFAST.   loratadine 10 MG tablet Commonly known as: CLARITIN Take 10 mg by mouth daily.   meclizine 25 MG tablet Commonly known as: ANTIVERT Take 25 mg by mouth 3 (three) times daily as needed for dizziness.   memantine 10 MG tablet Commonly known as: NAMENDA Take 1 tablet (10 mg total) by mouth 2 (two) times daily.   omeprazole 20 MG capsule Commonly known as: PRILOSEC TAKE 1 CAPSULE TWICE DAILY   polyethylene glycol 17 g packet Commonly known as: MIRALAX / GLYCOLAX Take 17 g by mouth daily.   potassium chloride SA 20 MEQ tablet Commonly known as: KLOR-CON Take 20 mEq by  mouth daily.   pravastatin 20 MG tablet Commonly known as: PRAVACHOL TAKE 1 TABLET EVERY DAY   PreserVision AREDS 2 Caps Take 1 capsule by mouth 2 (two) times daily.   sertraline 25 MG tablet Commonly known as: ZOLOFT Take 25 mg by mouth 2 (two) times daily.   torsemide 20 MG tablet Commonly known as: DEMADEX Take 20 mg by mouth daily.   warfarin 5 MG tablet Commonly known as: COUMADIN Take 5 mg by mouth. 5 mg all days except Sunday.   warfarin 6 MG tablet Commonly known as: COUMADIN Take 6 mg by mouth. '6MG'$ , oral, Once A Day on  Mon, Fri, warfrin '5mg'$  everyday EXCEPT MONDAY AND FRIDAYS warfrin '6mg'$ .        Review of Systems  Constitutional: Negative.   HENT: Negative.    Respiratory: Negative.    Cardiovascular:  Positive for leg swelling.  Gastrointestinal:  Positive for constipation and diarrhea.  Genitourinary: Negative.   Musculoskeletal:  Positive for arthralgias, gait problem and myalgias.  Skin: Negative.   Neurological:  Negative for dizziness.  Psychiatric/Behavioral:  Positive for confusion and dysphoric mood.    Immunization History  Administered Date(s) Administered   IPV 03/06/2016, 09/27/2016, 10/09/2017, 04/16/2018, 11/03/2018, 07/14/2019, 01/19/2020   Influenza Whole 01/17/2011   Influenza, High Dose Seasonal PF 02/24/2014, 01/05/2015, 01/09/2016, 01/10/2017, 02/25/2019   Influenza,inj,Quad PF,6+ Mos 02/13/2018   Influenza-Unspecified 02/11/2012, 02/14/2013, 02/24/2014, 02/02/2020   Moderna SARS-COV2 Booster Vaccination 10/19/2020   Moderna Sars-Covid-2 Vaccination 05/18/2019, 06/15/2019, 03/28/2020   Pneumococcal Conjugate-13 06/30/2014, 03/22/2018   Pneumococcal Polysaccharide-23 02/17/2009, 01/09/2012   Pneumococcal-Unspecified 06/30/2008, 02/17/2009   Td 03/10/2012   Tdap 02/12/2012   Zoster, Live 05/14/2008   Pertinent  Health Maintenance Due  Topic Date Due   INFLUENZA VACCINE  12/12/2020   PNA vac Low Risk Adult  Completed   DEXA SCAN   Discontinued   Fall Risk  07/05/2020 06/29/2020 06/08/2020 05/25/2020 04/20/2020  Falls in the past year? 0 0 0 0 0  Number falls in past yr: 0 0 0 0 0  Injury with Fall? - - - - -   Functional Status Survey:    Vitals:   01/12/21 0919  BP: (!) 148/65  Pulse: 83  Resp: 20  Temp: (!) 97 F (36.1 C)  SpO2: 95%  Weight: 131 lb (59.4 kg)  Height: 5' (1.524 m)   Body mass index is 25.58 kg/m. Physical Exam Vitals reviewed.  Constitutional:      Appearance: Normal appearance.  HENT:     Head: Normocephalic.     Nose: Nose normal.     Mouth/Throat:     Mouth: Mucous membranes are moist.     Pharynx: Oropharynx is clear.  Eyes:     Pupils: Pupils are equal, round, and reactive to light.  Cardiovascular:     Rate and Rhythm: Normal rate and regular rhythm.     Pulses: Normal pulses.     Heart sounds: Murmur heard.  Pulmonary:     Effort: Pulmonary effort is normal.     Breath sounds: Normal breath sounds.  Abdominal:     General: Abdomen is flat. Bowel sounds are normal.     Palpations: Abdomen is soft.  Musculoskeletal:        General: Swelling present.     Cervical back: Neck supple.     Comments: Mild swelling   Skin:    General: Skin is warm.  Neurological:     General: No focal deficit present.     Mental Status: She is alert.  Psychiatric:        Mood and Affect: Mood normal.        Thought Content: Thought content normal.    Labs reviewed: Recent Labs    06/04/20 0336 06/05/20 0426 06/06/20 0427 07/21/20 0000 10/20/20 0000 11/24/20 0000  NA 140 138 140 144 144 144  K 3.6 4.2 4.1 3.5 3.9 4.0  CL 107 106 108 96* 106 105  CO2 '25 27 24 '$ 35* 29* 32*  GLUCOSE 149* 145* 153*  --   --   --   BUN 28* 26* 30* 30* 32* 39*  CREATININE 0.67 0.80 0.76 1.1 0.9 1.1  CALCIUM 8.9 9.1 9.1 9.8 10.0 9.7  MG 2.0 2.1 2.2  --   --   --    Recent Labs    06/04/20 0336 06/05/20 0426 06/06/20 0427 10/20/20 0000 11/24/20 0000  AST '17 16 15 '$ 12* 15  ALT '16 16 20 7 15   '$ ALKPHOS 62 63 56 63 67  BILITOT 0.6 0.4 0.2*  --   --   PROT 6.1* 5.8* 5.5*  --   --   ALBUMIN 3.3* 3.1* 3.0* 4.0 4.1   Recent Labs    06/04/20 0336 06/05/20 0426 06/06/20 0427 10/20/20 0000 11/24/20 0000  WBC 10.3 10.6* 10.0 6.3 7.6  NEUTROABS 8.3* 9.3* 8.8* 3,837.00 3,952.00  HGB 12.1 12.0 12.2 11.0* 12.7  HCT 39.2 38.5 37.9 35* 39  MCV 93.6 94.1 90.9  --   --   PLT 171 169 160 180 194   Lab Results  Component Value Date   TSH 1.22 10/20/2020   No results found for: HGBA1C Lab Results  Component Value Date   CHOL 158 10/15/2019   HDL 60 10/15/2019   LDLCALC 78 10/15/2019   TRIG 76 06/03/2020   CHOLHDL 2.6 10/15/2019    Significant Diagnostic Results in last 30 days:  No results found.  Assessment/Plan Paroxysmal atrial fibrillation (HCC) Doing well on Coumadin Dose adjusted Hypothyroidism in adult TSH normal in 6/22 Edema of both legs On Low dose if Demadex Age-related osteoporosis without current pathological fracture On Prolia By Outside physician Cognitive impairment On Namenda MMSE  27/30 before Failed her Clock Drawing Recall was 3/3 Mixed incontinence urge and stress Failed Myrbetriq Managing well Myoclonic jerking On Gabapentin   Per Dr Doy Mince Neurologist at [facility] note from 07/19 She has h/o Myoclonic Jerks. He has tried different regimes. And now has her on Zoloft and Neurontin.He does not think she has NeuroDegenerative disorder and think it is functional Cannot Reduce Zoloft or Neurontin Diarrhea in adult patient Doing well Off Aricept HLD On Statin  Family/ staff Communication:   Labs/tests ordered:

## 2021-01-19 DIAGNOSIS — H353231 Exudative age-related macular degeneration, bilateral, with active choroidal neovascularization: Secondary | ICD-10-CM | POA: Diagnosis not present

## 2021-01-19 DIAGNOSIS — Z961 Presence of intraocular lens: Secondary | ICD-10-CM | POA: Diagnosis not present

## 2021-01-23 DIAGNOSIS — M810 Age-related osteoporosis without current pathological fracture: Secondary | ICD-10-CM | POA: Diagnosis not present

## 2021-02-01 DIAGNOSIS — Z23 Encounter for immunization: Secondary | ICD-10-CM | POA: Diagnosis not present

## 2021-02-06 LAB — LIPID PANEL
Cholesterol: 150 (ref 0–200)
HDL: 58 (ref 35–70)
LDL Cholesterol: 74

## 2021-02-13 ENCOUNTER — Encounter: Payer: Self-pay | Admitting: Orthopedic Surgery

## 2021-02-13 ENCOUNTER — Non-Acute Institutional Stay: Payer: Medicare Other | Admitting: Orthopedic Surgery

## 2021-02-13 DIAGNOSIS — N3946 Mixed incontinence: Secondary | ICD-10-CM | POA: Diagnosis not present

## 2021-02-13 DIAGNOSIS — G253 Myoclonus: Secondary | ICD-10-CM | POA: Diagnosis not present

## 2021-02-13 DIAGNOSIS — I480 Paroxysmal atrial fibrillation: Secondary | ICD-10-CM

## 2021-02-13 DIAGNOSIS — R600 Localized edema: Secondary | ICD-10-CM

## 2021-02-13 DIAGNOSIS — F339 Major depressive disorder, recurrent, unspecified: Secondary | ICD-10-CM

## 2021-02-13 DIAGNOSIS — E039 Hypothyroidism, unspecified: Secondary | ICD-10-CM

## 2021-02-13 DIAGNOSIS — R5383 Other fatigue: Secondary | ICD-10-CM

## 2021-02-13 DIAGNOSIS — R4189 Other symptoms and signs involving cognitive functions and awareness: Secondary | ICD-10-CM | POA: Diagnosis not present

## 2021-02-13 NOTE — Progress Notes (Signed)
Location:  [place] Room Number: 34 Place of Service:  ALF [phone]) Provider:  Windell Moulding, AGNP-C  [name], MD  Patient Care Team: [name], MD as PCP - General (Internal Medicine) Jettie Booze, MD as PCP - Cardiology (Cardiology) Thompson Grayer, MD as PCP - Electrophysiology (Cardiology) Blanch Media, MD as Consulting Physician (Neurology) Thompson Grayer, MD as Consulting Physician (Cardiology)  Extended Emergency Contact Information Primary Emergency Contact: Margaree Mackintosh Address: [place], [place] Phone: [phone] Relation: Spouse Secondary Emergency Contact: [name] Address: [address]          [place], [place] [address] [name] of [place] Phone: [phone] Relation: Daughter  Code Status:  DNR Goals of care: Advanced Directive information Advanced Directives 01/12/2021  Does Patient Have a Medical Advance Directive? Yes  Type of Paramedic of [place];Living will  Does patient want to make changes to medical advance directive? No - Patient declined  Copy of [place] in Chart? Yes - validated most recent copy scanned in chart (See row information)  Would patient like information on creating a medical advance directive? -     Chief Complaint  Patient presents with   Acute Visit    Fatigue     HPI:  Pt is a 87 y.o. female seen today for acute visit due to fatigue.   She reports increased fatigue for the past 2 weeks. States she feels "lousy" and is sleeping more. Husband passed away a few months ago. She is tearful talking about him today. 12/26/2020 would have been their 75 th wedding anniversary. She reports forgetting he is gone and will go across hall to tell him something and then remember he is not here. Remains on Zoloft 50 mg daily.   She denies chest pain, sob or pain.   Afib- rate  controlled with without medication, coumadin for clot prevention  BLE edema- torsemide daily, weights 3x weekly, NAS diet Hypothyroidism- TSH 1.22 10/2020, levothyroxine Cognitive impairment- no behavioral outbursts, MMSE 27/30, Aricept discontinued, remains on namenda Mixed incontinence- no increased issues, failed trial of myrbetriq Myoclonic jerking- followed by Dr. Reynolds/neurologist- involuntary movements not believed to be neurodegenerative disorder, advised to continue Zoloft and gabapentin   Past Medical History:  Diagnosis Date   Arthritis    Atrial fibrillation ([place])    Cataract    removed bilateral   Chronic diastolic CHF (congestive heart failure) ([place]) 06/02/2020   Clotting disorder ([place])    h/o blood clot left leg   Dementia (HCC)    Depression    DVT (deep venous thrombosis) ([place]) 2011   GERD (gastroesophageal reflux disease)    HB (heart block)    s/p PPM, most recent generator change 12/11 by JA   Heart murmur    Hiatal hernia    Hyperlipidemia    Hypertension    Hypothyroidism    Osteoporosis    PTE (post-transplant erythrocytosis)    Pulmonary embolism ([place]) 2011   on coumadin   UTI (lower urinary tract infection)    Past Surgical History:  Procedure Laterality Date   BLADDER SURGERY     twice   cataracts     bilateral   CHOLECYSTECTOMY  09/2010   COLONOSCOPY     ESOPHAGOGASTRODUODENOSCOPY     HEMORRHOID SURGERY     hysterectomy (other)     INNER EAR  SURGERY     JOINT REPLACEMENT     pacemaker     most recent generator 12/11 by [place] ARTHROPLASTY Left     Allergies  Allergen Reactions   Alendronate Other (See Comments)    Can not tolerate   Celebrex [Celecoxib] Other (See Comments)    Can not tolerate   Meloxicam Other (See Comments)    Avoid    Myrbetriq [Mirabegron] Other (See Comments)    Can not tolerate   Norco [Hydrocodone-Acetaminophen] Other (See Comments)    Dizziness and jerking   Pradaxa [Dabigatran Etexilate  Mesylate] Other (See Comments)    Can not tolerate   Pradaxa [Dabigatran]    Toviaz [Fesoterodine Fumarate Er] Other (See Comments)    Can not tolerate   Vesicare [Solifenacin] Other (See Comments)    Can not tolerate    Outpatient Encounter Medications as of 02/13/2021  Medication Sig   acetaminophen (TYLENOL) 325 MG tablet Take 650 mg by mouth 3 (three) times daily.   Calcium Carb-Cholecalciferol 600-800 MG-UNIT TABS Take 1 tablet by mouth daily.   cholecalciferol (VITAMIN D) 1000 UNITS tablet Take 1,000 Units by mouth daily.    denosumab (PROLIA) 60 MG/ML SOSY injection Inject 60 mg into the skin. Once A Day on the 1st of Every 6th Month   gabapentin (NEURONTIN) 100 MG capsule Take 2 capsules (200 mg total) by mouth daily with lunch.   gabapentin (NEURONTIN) 300 MG capsule TAKE 1 CAPSULE TWICE DAILY   levothyroxine (SYNTHROID) 50 MCG tablet TAKE 1 TABLET (50 MCG TOTAL) BY MOUTH DAILY BEFORE BREAKFAST.   loratadine (CLARITIN) 10 MG tablet Take 10 mg by mouth daily.   meclizine (ANTIVERT) 25 MG tablet Take 25 mg by mouth 3 (three) times daily as needed for dizziness.   memantine (NAMENDA) 10 MG tablet Take 1 tablet (10 mg total) by mouth 2 (two) times daily.   Multiple Vitamins-Minerals (PRESERVISION AREDS 2) CAPS Take 1 capsule by mouth 2 (two) times daily.   omeprazole (PRILOSEC) 20 MG capsule TAKE 1 CAPSULE TWICE DAILY   polyethylene glycol (MIRALAX / GLYCOLAX) 17 g packet Take 17 g by mouth daily.   potassium chloride SA (KLOR-CON) 20 MEQ tablet Take 20 mEq by mouth daily.   pravastatin (PRAVACHOL) 20 MG tablet TAKE 1 TABLET EVERY DAY   sertraline (ZOLOFT) 25 MG tablet Take 25 mg by mouth 2 (two) times daily.   torsemide (DEMADEX) 20 MG tablet Take 20 mg by mouth daily.   warfarin (COUMADIN) 5 MG tablet Take 5 mg by mouth. 5 mg all days except Sunday.   warfarin (COUMADIN) 6 MG tablet Take 6 mg by mouth. 6MG , oral, Once A Day on Mon, Fri, warfrin 5mg  everyday EXCEPT MONDAY AND  FRIDAYS warfrin 6mg .   [DISCONTINUED] colestipol (COLESTID) 5 g granules Take 5 g by mouth daily with supper.   No facility-administered encounter medications on file as of 02/13/2021.    Review of Systems  Constitutional:  Positive for fatigue. Negative for activity change, appetite change, chills and fever.  HENT:  Positive for hearing loss. Negative for congestion, dental problem, sore throat and trouble swallowing.   Eyes:  Negative for visual disturbance.       Glasses  Respiratory:  Negative for cough, shortness of breath and wheezing.   Cardiovascular:  Negative for chest pain and leg swelling.  Gastrointestinal:  Negative for abdominal distention, abdominal pain, constipation, diarrhea and nausea.  Genitourinary:  Positive for frequency and urgency. Negative  for dysuria and hematuria.  Musculoskeletal:  Positive for arthralgias, back pain, gait problem and myalgias.  Skin: Negative.   Neurological:  Positive for weakness and numbness. Negative for dizziness and headaches.  Psychiatric/Behavioral:  Positive for confusion and dysphoric mood. Negative for sleep disturbance. The patient is not nervous/anxious.    Immunization History  Administered Date(s) Administered   IPV 03/06/2016, 09/27/2016, 10/09/2017, 04/16/2018, 11/03/2018, 07/14/2019, 01/19/2020   Influenza Whole 01/17/2011   Influenza, High Dose Seasonal PF 02/24/2014, 01/05/2015, 01/09/2016, 01/10/2017, 02/25/2019   Influenza,inj,Quad PF,6+ Mos 02/13/2018   Influenza-Unspecified 02/11/2012, 02/14/2013, 02/24/2014, 02/02/2020   Moderna SARS-COV2 Booster Vaccination 10/19/2020   Moderna Sars-Covid-2 Vaccination 05/18/2019, 06/15/2019, 03/28/2020   Pneumococcal Conjugate-13 06/30/2014, 03/22/2018   Pneumococcal Polysaccharide-23 02/17/2009, 01/09/2012   Pneumococcal-Unspecified 06/30/2008, 02/17/2009   Td 03/10/2012   Tdap 02/12/2012   Zoster, Live 05/14/2008   Pertinent  Health Maintenance Due  Topic Date Due    INFLUENZA VACCINE  12/12/2020   DEXA SCAN  Discontinued   Fall Risk  07/05/2020 06/29/2020 06/08/2020 05/25/2020 04/20/2020  Falls in the past year? 0 0 0 0 0  Number falls in past yr: 0 0 0 0 0  Injury with Fall? - - - - -   Functional Status Survey:    Vitals:   02/13/21 1136  BP: 129/75  Pulse: 98  Resp: 16  Temp: (!) 97.1 F (36.2 C)  SpO2: 96%  Weight: 130 lb 6.4 oz (59.1 kg)  Height: 5' (1.524 m)   Body mass index is 25.47 kg/m. Physical Exam Vitals reviewed.  Constitutional:      General: She is not in acute distress.    Comments: Pale complexion  HENT:     Head: Normocephalic.     Right Ear: There is no impacted cerumen.     Left Ear: There is no impacted cerumen.     Nose: Nose normal.     Mouth/Throat:     Mouth: Mucous membranes are moist.  Eyes:     General:        Right eye: No discharge.        Left eye: No discharge.  Neck:     Thyroid: No thyroid mass or thyromegaly.     Vascular: No carotid bruit.  Cardiovascular:     Rate and Rhythm: Normal rate. Rhythm irregular.     Pulses: Normal pulses.     Heart sounds: Murmur heard.  Pulmonary:     Effort: Pulmonary effort is normal. No respiratory distress.     Breath sounds: Normal breath sounds. No wheezing.  Abdominal:     General: Bowel sounds are normal. There is no distension.     Palpations: Abdomen is soft.     Tenderness: There is no abdominal tenderness.  Musculoskeletal:     Cervical back: Normal range of motion.     Right lower leg: No edema.     Left lower leg: No edema.  Lymphadenopathy:     Cervical: No cervical adenopathy.  Skin:    General: Skin is warm and dry.     Capillary Refill: Capillary refill takes less than 2 seconds.  Neurological:     General: No focal deficit present.     Mental Status: She is alert and oriented to person, place, and time.     Motor: Weakness present.     Gait: Gait abnormal.     Comments: walker  Psychiatric:        Mood and Affect: Mood normal.  Behavior: Behavior normal.    Labs reviewed: Recent Labs    06/04/20 0336 06/05/20 0426 06/06/20 0427 07/21/20 0000 10/20/20 0000 11/24/20 0000  NA 140 138 140 144 144 144  K 3.6 4.2 4.1 3.5 3.9 4.0  CL 107 106 108 96* 106 105  CO2 25 27 24  35* 29* 32*  GLUCOSE 149* 145* 153*  --   --   --   BUN 28* 26* 30* 30* 32* 39*  CREATININE 0.67 0.80 0.76 1.1 0.9 1.1  CALCIUM 8.9 9.1 9.1 9.8 10.0 9.7  MG 2.0 2.1 2.2  --   --   --    Recent Labs    06/04/20 0336 06/05/20 0426 06/06/20 0427 10/20/20 0000 11/24/20 0000  AST 17 16 15  12* 15  ALT 16 16 20 7 15   ALKPHOS 62 63 56 63 67  BILITOT 0.6 0.4 0.2*  --   --   PROT 6.1* 5.8* 5.5*  --   --   ALBUMIN 3.3* 3.1* 3.0* 4.0 4.1   Recent Labs    06/04/20 0336 06/05/20 0426 06/06/20 0427 10/20/20 0000 11/24/20 0000  WBC 10.3 10.6* 10.0 6.3 7.6  NEUTROABS 8.3* 9.3* 8.8* 3,837.00 3,952.00  HGB 12.1 12.0 12.2 11.0* 12.7  HCT 39.2 38.5 37.9 35* 39  MCV 93.6 94.1 90.9  --   --   PLT 171 169 160 180 194   Lab Results  Component Value Date   TSH 1.22 10/20/2020   No results found for: HGBA1C Lab Results  Component Value Date   CHOL 158 10/15/2019   HDL 60 10/15/2019   LDLCALC 78 10/15/2019   TRIG 76 06/03/2020   CHOLHDL 2.6 10/15/2019    Significant Diagnostic Results in last 30 days:  No results found.  Assessment/Plan 1. Fatigue, unspecified type - ongoing for last 2 weeks - suspect from depression related to husbands death  - pale complexion, appears tired - cbc/diff - cmp  2. Paroxysmal atrial fibrillation (HCC) - rate controlled without medication - cont coumadin for clot prevention  3. Hypothyroidism in adult - TSH 1.22 10/2020 -TSH  4. Edema of both legs - no edema today - cont torsemide - cont NAS  5. Cognitive impairment - MMSE 27/30 - appropriate today - no recent behavioral outbursts - cont namenda  6. Mixed incontinence urge and stress - no increased urinary issues - failed trial  of Myrbetriq  7. Myoclonic jerking -followed by neurology- not believed to be neuro - cont Zoloft and gabapentin regimen   8. Depression,recurrent - tearful today - daughter reports increased blues - cont Zoloft 50 mg bid - may consider increasing dose if lab work normal  Pharmacist, hospital Communication: plan discussed with patient, daughter and nurse  Labs/tests ordered:  cbc/diff, cmp, TSH

## 2021-02-14 LAB — COMPREHENSIVE METABOLIC PANEL
Albumin: 4.1 (ref 3.5–5.0)
Calcium: 9.2 (ref 8.7–10.7)
Globulin: 2.1

## 2021-02-14 LAB — HEPATIC FUNCTION PANEL
ALT: 14 (ref 7–35)
AST: 14 (ref 13–35)
Alkaline Phosphatase: 73 (ref 25–125)
Bilirubin, Total: 0.3

## 2021-02-14 LAB — BASIC METABOLIC PANEL
BUN: 34 — AB (ref 4–21)
CO2: 26 — AB (ref 13–22)
Chloride: 106 (ref 99–108)
Creatinine: 1.2 — AB (ref 0.5–1.1)
Glucose: 114
Potassium: 4 (ref 3.4–5.3)
Sodium: 143 (ref 137–147)

## 2021-02-14 LAB — CBC AND DIFFERENTIAL
HCT: 39 (ref 36–46)
Hemoglobin: 12.8 (ref 12.0–16.0)
Platelets: 219 (ref 150–399)
WBC: 5.7

## 2021-02-14 LAB — CBC: RBC: 4.21 (ref 3.87–5.11)

## 2021-02-14 LAB — TSH: TSH: 1.33 (ref 0.41–5.90)

## 2021-02-15 ENCOUNTER — Encounter: Payer: Self-pay | Admitting: Orthopedic Surgery

## 2021-02-15 ENCOUNTER — Non-Acute Institutional Stay: Payer: Medicare Other | Admitting: Orthopedic Surgery

## 2021-02-15 DIAGNOSIS — I480 Paroxysmal atrial fibrillation: Secondary | ICD-10-CM

## 2021-02-15 DIAGNOSIS — N3946 Mixed incontinence: Secondary | ICD-10-CM

## 2021-02-15 DIAGNOSIS — R600 Localized edema: Secondary | ICD-10-CM

## 2021-02-15 DIAGNOSIS — F419 Anxiety disorder, unspecified: Secondary | ICD-10-CM

## 2021-02-15 DIAGNOSIS — E039 Hypothyroidism, unspecified: Secondary | ICD-10-CM | POA: Diagnosis not present

## 2021-02-15 DIAGNOSIS — G8929 Other chronic pain: Secondary | ICD-10-CM

## 2021-02-15 DIAGNOSIS — G253 Myoclonus: Secondary | ICD-10-CM | POA: Diagnosis not present

## 2021-02-15 DIAGNOSIS — F32A Depression, unspecified: Secondary | ICD-10-CM

## 2021-02-15 DIAGNOSIS — M545 Low back pain, unspecified: Secondary | ICD-10-CM

## 2021-02-15 DIAGNOSIS — R4189 Other symptoms and signs involving cognitive functions and awareness: Secondary | ICD-10-CM

## 2021-02-15 MED ORDER — SERTRALINE HCL 50 MG PO TABS: 50.0000 mg | ORAL_TABLET | Freq: Every morning | ORAL | 3 refills | Status: DC

## 2021-02-15 NOTE — Progress Notes (Signed)
Location:  [facility] of Service:  ALF [phone]) Provider:  Windell Moulding, AGNP-C  [name], MD  Patient Care Team: [name], MD as PCP - General (Internal Medicine) Jettie Booze, MD as PCP - Cardiology (Cardiology) Thompson Grayer, MD as PCP - Electrophysiology (Cardiology) Blanch Media, MD as Consulting Physician (Neurology) Thompson Grayer, MD as Consulting Physician (Cardiology)  Extended Emergency Contact Information Primary Emergency Contact: Margaree Mackintosh Address: [place], [place] Phone: [phone] Relation: Spouse Secondary Emergency Contact: [name] Address: [address]          [place], [place] [address] [name] of [place] Phone: [phone] Relation: Daughter  Code Status:  DNR Goals of care: Advanced Directive information Advanced Directives 01/12/2021  Does Patient Have a Medical Advance Directive? Yes  Type of Paramedic of [place];Living will  Does patient want to make changes to medical advance directive? No - Patient declined  Copy of [place] in Chart? Yes - validated most recent copy scanned in chart (See row information)  Would patient like information on creating a medical advance directive? -     Chief Complaint  Patient presents with   Acute Visit    Anxiety and low back pain     HPI:  Pt is a 87 y.o. female seen today for increased anxiety and low back pain.   She was been 10/03 for fatigue. Cbc/diff, cmp and TSH unremarkable. Husband passed away a few months ago, she continues to be tearful discussing him today. She use to be on Zoloft 75 mg daily, but was reduced to 50 mg earlier this year. She is willing to increase medication to help with depression and anxiety. No recent panic attacks.   She also reports increased low back pain within the past month. She use to use salonopas patches  when her back was painful. Rates back pain 3/10 today, described as stiff. Receives scheduled tylenol 650 mg tid.   Upset she has not seen dermatology for yearly check up. Would like to be scheduled with in-house dermatology clinic.   Afib- rate controlled with without medication, coumadin for clot prevention  BLE edema- torsemide daily, weights 3x weekly, NAS diet Hypothyroidism- TSH 1.33 02/12/2021, levothyroxine Cognitive impairment- no behavioral outbursts, MMSE 27/30, Aricept discontinued, remains on namenda Mixed incontinence- no increased issues, failed trial of myrbetriq Myoclonic jerking- followed by Dr. Reynolds/neurologist- involuntary movements not believed to be neurodegenerative disorder, advised to continue Zoloft and gabapentin  Past Medical History:  Diagnosis Date   Arthritis    Atrial fibrillation ([facility])    Cataract    removed bilateral   Chronic diastolic CHF (congestive heart failure) ([facility]) 06/02/2020   Clotting disorder ([facility])    h/o blood clot left leg   Dementia (HCC)    Depression    DVT (deep venous thrombosis) ([facility]) 2011   GERD (gastroesophageal reflux disease)    HB (heart block)    s/p PPM, most recent generator change 12/11 by JA   Heart murmur    Hiatal hernia    Hyperlipidemia    Hypertension    Hypothyroidism    Osteoporosis    PTE (post-transplant erythrocytosis)    Pulmonary embolism ([facility]) 2011   on coumadin   UTI (lower urinary tract infection)    Past Surgical History:  Procedure Laterality Date   BLADDER SURGERY  twice   cataracts     bilateral   CHOLECYSTECTOMY  09/2010   COLONOSCOPY     ESOPHAGOGASTRODUODENOSCOPY     HEMORRHOID SURGERY     hysterectomy (other)     INNER EAR SURGERY     JOINT REPLACEMENT     pacemaker     most recent generator 12/11 by JA   TOTAL KNEE ARTHROPLASTY Left     Allergies  Allergen Reactions   Alendronate Other (See Comments)    Can not tolerate   Celebrex [Celecoxib] Other (See Comments)     Can not tolerate   Meloxicam Other (See Comments)    Avoid    Myrbetriq [Mirabegron] Other (See Comments)    Can not tolerate   Norco [Hydrocodone-Acetaminophen] Other (See Comments)    Dizziness and jerking   Pradaxa [Dabigatran Etexilate Mesylate] Other (See Comments)    Can not tolerate   Pradaxa [Dabigatran]    Toviaz [Fesoterodine Fumarate Er] Other (See Comments)    Can not tolerate   Vesicare [Solifenacin] Other (See Comments)    Can not tolerate    Outpatient Encounter Medications as of 02/15/2021  Medication Sig   acetaminophen (TYLENOL) 325 MG tablet Take 650 mg by mouth 3 (three) times daily.   Calcium Carb-Cholecalciferol 600-800 MG-UNIT TABS Take 1 tablet by mouth daily.   cholecalciferol (VITAMIN D) 1000 UNITS tablet Take 1,000 Units by mouth daily.    denosumab (PROLIA) 60 MG/ML SOSY injection Inject 60 mg into the skin. Once A Day on the 1st of Every 6th Month   gabapentin (NEURONTIN) 100 MG capsule Take 2 capsules (200 mg total) by mouth daily with lunch.   gabapentin (NEURONTIN) 300 MG capsule TAKE 1 CAPSULE TWICE DAILY   levothyroxine (SYNTHROID) 50 MCG tablet TAKE 1 TABLET (50 MCG TOTAL) BY MOUTH DAILY BEFORE BREAKFAST.   loratadine (CLARITIN) 10 MG tablet Take 10 mg by mouth daily.   meclizine (ANTIVERT) 25 MG tablet Take 25 mg by mouth 3 (three) times daily as needed for dizziness.   memantine (NAMENDA) 10 MG tablet Take 1 tablet (10 mg total) by mouth 2 (two) times daily.   Multiple Vitamins-Minerals (PRESERVISION AREDS 2) CAPS Take 1 capsule by mouth 2 (two) times daily.   omeprazole (PRILOSEC) 20 MG capsule TAKE 1 CAPSULE TWICE DAILY   polyethylene glycol (MIRALAX / GLYCOLAX) 17 g packet Take 17 g by mouth daily.   potassium chloride SA (KLOR-CON) 20 MEQ tablet Take 20 mEq by mouth daily.   pravastatin (PRAVACHOL) 20 MG tablet TAKE 1 TABLET EVERY DAY   sertraline (ZOLOFT) 25 MG tablet Take 25 mg by mouth 2 (two) times daily.   torsemide (DEMADEX) 20 MG  tablet Take 20 mg by mouth daily.   warfarin (COUMADIN) 5 MG tablet Take 5 mg by mouth. 5 mg all days except Sunday.   warfarin (COUMADIN) 6 MG tablet Take 6 mg by mouth. 6MG , oral, Once A Day on Mon, Fri, warfrin 5mg  everyday EXCEPT MONDAY AND FRIDAYS warfrin 6mg .   [DISCONTINUED] colestipol (COLESTID) 5 g granules Take 5 g by mouth daily with supper.   No facility-administered encounter medications on file as of 02/15/2021.    Review of Systems  Constitutional:  Positive for fatigue. Negative for activity change, appetite change, chills and fever.  HENT:  Positive for hearing loss.   Eyes:  Negative for visual disturbance.  Respiratory:  Negative for cough, shortness of breath and wheezing.   Cardiovascular:  Positive for leg swelling. Negative for  chest pain.  Gastrointestinal:  Negative for abdominal distention, abdominal pain, constipation, diarrhea, nausea and rectal pain.  Genitourinary:  Positive for frequency. Negative for dysuria and urgency.  Musculoskeletal:  Positive for arthralgias, back pain, gait problem and myalgias.  Skin: Negative.   Neurological:  Positive for weakness. Negative for dizziness and headaches.  Psychiatric/Behavioral:  Positive for confusion and dysphoric mood. Negative for sleep disturbance. The patient is nervous/anxious.    Immunization History  Administered Date(s) Administered   IPV 03/06/2016, 09/27/2016, 10/09/2017, 04/16/2018, 11/03/2018, 07/14/2019, 01/19/2020   Influenza Whole 01/17/2011   Influenza, High Dose Seasonal PF 02/24/2014, 01/05/2015, 01/09/2016, 01/10/2017, 02/25/2019   Influenza,inj,Quad PF,6+ Mos 02/13/2018   Influenza-Unspecified 02/11/2012, 02/14/2013, 02/24/2014, 02/02/2020   Moderna SARS-COV2 Booster Vaccination 10/19/2020   Moderna Sars-Covid-2 Vaccination 05/18/2019, 06/15/2019, 03/28/2020   Pneumococcal Conjugate-13 06/30/2014, 03/22/2018   Pneumococcal Polysaccharide-23 02/17/2009, 01/09/2012   Pneumococcal-Unspecified  06/30/2008, 02/17/2009   Td 03/10/2012   Tdap 02/12/2012   Zoster, Live 05/14/2008   Pertinent  Health Maintenance Due  Topic Date Due   INFLUENZA VACCINE  12/12/2020   DEXA SCAN  Discontinued   Fall Risk  07/05/2020 06/29/2020 06/08/2020 05/25/2020 04/20/2020  Falls in the past year? 0 0 0 0 0  Number falls in past yr: 0 0 0 0 0  Injury with Fall? - - - - -   Functional Status Survey:    Vitals:   02/15/21 1203  BP: 129/75  Pulse: 80  Resp: 16  Temp: 98.3 F (36.8 C)  SpO2: 96%  Weight: 129 lb 6.4 oz (58.7 kg)  Height: 5' (1.524 m)   Body mass index is 25.27 kg/m. Physical Exam Vitals reviewed.  Constitutional:      General: She is not in acute distress. HENT:     Head: Normocephalic.  Eyes:     General:        Right eye: No discharge.        Left eye: No discharge.  Neck:     Vascular: No carotid bruit.  Cardiovascular:     Rate and Rhythm: Normal rate. Rhythm irregular.     Pulses: Normal pulses.     Heart sounds: Normal heart sounds. No murmur heard. Pulmonary:     Effort: Pulmonary effort is normal. No respiratory distress.     Breath sounds: Normal breath sounds. No wheezing.  Abdominal:     General: Bowel sounds are normal. There is no distension.     Palpations: Abdomen is soft.     Tenderness: There is no abdominal tenderness.  Musculoskeletal:     Cervical back: Normal range of motion.     Right lower leg: No edema.     Left lower leg: No edema.  Lymphadenopathy:     Cervical: No cervical adenopathy.  Skin:    General: Skin is warm and dry.     Capillary Refill: Capillary refill takes less than 2 seconds.  Neurological:     General: No focal deficit present.     Mental Status: She is alert. Mental status is at baseline.     Motor: Weakness present.     Gait: Gait abnormal.     Comments: walker  Psychiatric:        Mood and Affect: Mood is anxious. Affect is tearful.        Behavior: Behavior normal.        Cognition and Memory: Memory is  impaired.    Labs reviewed: Recent Labs    06/04/20 0336 06/05/20  [id number] 06/06/20 0427 07/21/20 0000 10/20/20 0000 11/24/20 0000  NA 140 138 140 144 144 144  K 3.6 4.2 4.1 3.5 3.9 4.0  CL 107 106 108 96* 106 105  CO2 25 27 24  35* 29* 32*  GLUCOSE 149* 145* 153*  --   --   --   BUN 28* 26* 30* 30* 32* 39*  CREATININE 0.67 0.80 0.76 1.1 0.9 1.1  CALCIUM 8.9 9.1 9.1 9.8 10.0 9.7  MG 2.0 2.1 2.2  --   --   --    Recent Labs    06/04/20 0336 06/05/20 0426 06/06/20 0427 10/20/20 0000 11/24/20 0000  AST 17 16 15  12* 15  ALT 16 16 20 7 15   ALKPHOS 62 63 56 63 67  BILITOT 0.6 0.4 0.2*  --   --   PROT 6.1* 5.8* 5.5*  --   --   ALBUMIN 3.3* 3.1* 3.0* 4.0 4.1   Recent Labs    06/04/20 0336 06/05/20 0426 06/06/20 0427 10/20/20 0000 11/24/20 0000  WBC 10.3 10.6* 10.0 6.3 7.6  NEUTROABS 8.3* 9.3* 8.8* 3,837.00 3,952.00  HGB 12.1 12.0 12.2 11.0* 12.7  HCT 39.2 38.5 37.9 35* 39  MCV 93.6 94.1 90.9  --   --   PLT 171 169 160 180 194   Lab Results  Component Value Date   TSH 1.22 10/20/2020   No results found for: HGBA1C Lab Results  Component Value Date   CHOL 158 10/15/2019   HDL 60 10/15/2019   LDLCALC 78 10/15/2019   TRIG 76 06/03/2020   CHOLHDL 2.6 10/15/2019    Significant Diagnostic Results in last 30 days:  No results found.  Assessment/Plan 1. Anxiety and depression - suspect due to husbands death a few months ago - appears tearful and anxious today - reported increased fatigue a few days ago - will increase zoloft AM dose to 50 mg - cont zoloft 25 mg qhs  2. Chronic midline low back pain without sciatica - intermittent pain, rated 3/10, stiffness - cont scheduled tylenol - start Lidocaine 4% patch- apply to lower back daily x 14 days  3. Paroxysmal atrial fibrillation ([facility]CC) - rate controlled  - cont coumadin for clot prevention  4. Edema of both legs - no edema today - cont torsemide daily  5. Hypothyroidism in adult - TSH 1.33  02/12/2021  6. Cognitive impairment - no recent behavioral outbursts - cont namenda  7. Mixed incontinence urge and stress - failed trial of myrbetriq  8. Myoclonic jerking - followed by neurology- not believed to be neuro issue - cont Zoloft and gabapentin regimen    Family/ staff Communication: plan discussed with patient and nurse  Labs/tests ordered:  none

## 2021-02-22 DIAGNOSIS — L821 Other seborrheic keratosis: Secondary | ICD-10-CM | POA: Diagnosis not present

## 2021-02-22 DIAGNOSIS — L570 Actinic keratosis: Secondary | ICD-10-CM | POA: Diagnosis not present

## 2021-02-22 DIAGNOSIS — L814 Other melanin hyperpigmentation: Secondary | ICD-10-CM | POA: Diagnosis not present

## 2021-02-22 DIAGNOSIS — D692 Other nonthrombocytopenic purpura: Secondary | ICD-10-CM | POA: Diagnosis not present

## 2021-03-02 ENCOUNTER — Ambulatory Visit (INDEPENDENT_AMBULATORY_CARE_PROVIDER_SITE_OTHER): Payer: Medicare Other

## 2021-03-02 DIAGNOSIS — I442 Atrioventricular block, complete: Secondary | ICD-10-CM

## 2021-03-06 LAB — CUP PACEART REMOTE DEVICE CHECK
Battery Impedance: 1994 Ohm
Battery Remaining Longevity: 35 mo
Battery Voltage: 2.76 V
Brady Statistic AP VP Percent: 2 %
Brady Statistic AP VS Percent: 0 %
Brady Statistic AS VP Percent: 98 %
Brady Statistic AS VS Percent: 0 %
Date Time Interrogation Session: 20221022142657
Implantable Lead Implant Date: 19980518
Implantable Lead Implant Date: 20040301
Implantable Lead Location: 753859
Implantable Lead Location: 753860
Implantable Lead Model: 4269
Implantable Lead Model: 4285
Implantable Lead Serial Number: 249440
Implantable Lead Serial Number: 295540
Implantable Pulse Generator Implant Date: 20111206
Lead Channel Impedance Value: 605 Ohm
Lead Channel Impedance Value: 845 Ohm
Lead Channel Pacing Threshold Amplitude: 1.125 V
Lead Channel Pacing Threshold Pulse Width: 0.4 ms
Lead Channel Setting Pacing Amplitude: 2 V
Lead Channel Setting Pacing Amplitude: 2.5 V
Lead Channel Setting Pacing Pulse Width: 0.4 ms
Lead Channel Setting Sensing Sensitivity: 5.6 mV

## 2021-03-10 NOTE — Progress Notes (Signed)
Remote pacemaker transmission.   

## 2021-03-16 DIAGNOSIS — L814 Other melanin hyperpigmentation: Secondary | ICD-10-CM | POA: Diagnosis not present

## 2021-03-16 DIAGNOSIS — L821 Other seborrheic keratosis: Secondary | ICD-10-CM | POA: Diagnosis not present

## 2021-03-16 DIAGNOSIS — L570 Actinic keratosis: Secondary | ICD-10-CM | POA: Diagnosis not present

## 2021-03-23 DIAGNOSIS — Z961 Presence of intraocular lens: Secondary | ICD-10-CM | POA: Diagnosis not present

## 2021-03-23 DIAGNOSIS — H353231 Exudative age-related macular degeneration, bilateral, with active choroidal neovascularization: Secondary | ICD-10-CM | POA: Diagnosis not present

## 2021-04-03 ENCOUNTER — Encounter: Payer: Self-pay | Admitting: Orthopedic Surgery

## 2021-04-03 ENCOUNTER — Non-Acute Institutional Stay: Payer: Medicare Other | Admitting: Orthopedic Surgery

## 2021-04-03 DIAGNOSIS — N3946 Mixed incontinence: Secondary | ICD-10-CM

## 2021-04-03 DIAGNOSIS — I480 Paroxysmal atrial fibrillation: Secondary | ICD-10-CM | POA: Diagnosis not present

## 2021-04-03 DIAGNOSIS — R600 Localized edema: Secondary | ICD-10-CM

## 2021-04-03 DIAGNOSIS — R519 Headache, unspecified: Secondary | ICD-10-CM | POA: Diagnosis not present

## 2021-04-03 DIAGNOSIS — G253 Myoclonus: Secondary | ICD-10-CM

## 2021-04-03 DIAGNOSIS — F419 Anxiety disorder, unspecified: Secondary | ICD-10-CM | POA: Diagnosis not present

## 2021-04-03 DIAGNOSIS — R4189 Other symptoms and signs involving cognitive functions and awareness: Secondary | ICD-10-CM | POA: Diagnosis not present

## 2021-04-03 DIAGNOSIS — F32A Depression, unspecified: Secondary | ICD-10-CM | POA: Diagnosis not present

## 2021-04-03 DIAGNOSIS — E039 Hypothyroidism, unspecified: Secondary | ICD-10-CM | POA: Diagnosis not present

## 2021-04-03 DIAGNOSIS — R296 Repeated falls: Secondary | ICD-10-CM | POA: Diagnosis not present

## 2021-04-03 NOTE — Progress Notes (Signed)
Location:   [facility] Room Number: [place] of Service:  ALF [phone]) Provider:  Windell Moulding, NP    Patient Care Team: [name], MD as PCP - General (Internal Medicine) Jettie Booze, MD as PCP - Cardiology (Cardiology) Thompson Grayer, MD as PCP - Electrophysiology (Cardiology) Blanch Media, MD as Consulting Physician (Neurology) Thompson Grayer, MD as Consulting Physician (Cardiology)  Extended Emergency Contact Information Primary Emergency Contact: Margaree Mackintosh Address: [place], [place] of [place] Phone: [phone] Relation: Spouse Secondary Emergency Contact: [name] Address: [address]          [place], [place] [address] [name] of [place] Phone: [phone] Relation: Daughter  Code Status:  DNR Goals of care: Advanced Directive information Advanced Directives 04/03/2021  Does Patient Have a Medical Advance Directive? Yes  Type of Paramedic of [place];Living will;Out of facility DNR (pink MOST or yellow form)  Does patient want to make changes to medical advance directive? No - Patient declined  Copy of [place] in Chart? Yes - validated most recent copy scanned in chart (See row information)  Would patient like information on creating a medical advance directive? -     Chief Complaint  Patient presents with   Acute Visit    Weakness.     HPI:  Pt is a 87 y.o. female seen today for an acute visit for weakness.   She was seen 02/13/2021 for fatigue. Cbc/diff, cmp and TSH unremarkable. Today, she reports progressive weakness since last encounter. Nursing reports she is leaving her room less often. She has not had dinner in the dining room for a few weeks. In addition, she had a fall without injury 03/22/2021. She was found on the floor beside her recliner, appears she slipped forward from chair. She reports hitting  her head. Complains of intermittent headaches to forehead. Headaches relieved by tylenol. She has trouble describing headaches. No reports of head injury from nursing staff, mild pain to right hip noted. Vitals and neuro checks normal after fall. She is able to bear weight on both legs. Uses rolator to ambulate.   Afib- rate controlled with without medication, HR 70-80's, coumadin for clot prevention  BLE edema- torsemide daily, weights 3x weekly, NAS diet Hypothyroidism- TSH 1.33 02/12/2021, levothyroxine Cognitive impairment- no behavioral outbursts, MMSE 27/30, Aricept discontinued, remains on namenda Mixed incontinence- no increased issues, failed trial of myrbetriq Myoclonic jerking- followed by Dr. Reynolds/neurologist- involuntary movements not believed to be neurodegenerative disorder, advised to continue Zoloft and gabapentin Anxiety and depression- no recent panic attacks, does not cry when discussing her husband passing, zoloft increased last encounter  Recent weights:  11/21- 131.4 lbs  11/02- 132 lbs  10/03- 130.4 lbs  Recent blood pressures:  11/21- 113/59  11/16- 109/57  11/10- 124/76    Past Medical History:  Diagnosis Date   Arthritis    Atrial fibrillation (HCC)    Cataract    removed bilateral   Chronic diastolic CHF (congestive heart failure) ([facility]) 06/02/2020   Clotting disorder ([facility])    h/o blood clot left leg   Dementia ([place])    Depression    DVT (deep venous thrombosis) ([facility]) 2011   GERD (gastroesophageal reflux disease)    HB (heart block)    s/p PPM, most recent generator change 12/11 by JA   Heart murmur    Hiatal hernia  Hyperlipidemia    Hypertension    Hypothyroidism    Osteoporosis    PTE (post-transplant erythrocytosis)    Pulmonary embolism ([facility]) 2011   on coumadin   UTI (lower urinary tract infection)    Past Surgical History:  Procedure Laterality Date   BLADDER SURGERY     twice   cataracts     bilateral   CHOLECYSTECTOMY   09/2010   COLONOSCOPY     ESOPHAGOGASTRODUODENOSCOPY     HEMORRHOID SURGERY     hysterectomy (other)     INNER EAR SURGERY     JOINT REPLACEMENT     pacemaker     most recent generator 12/11 by JA   TOTAL KNEE ARTHROPLASTY Left     Allergies  Allergen Reactions   Alendronate Other (See Comments)    Can not tolerate   Celebrex [Celecoxib] Other (See Comments)    Can not tolerate   Meloxicam Other (See Comments)    Avoid    Myrbetriq [Mirabegron] Other (See Comments)    Can not tolerate   Norco [Hydrocodone-Acetaminophen] Other (See Comments)    Dizziness and jerking   Pradaxa [Dabigatran Etexilate Mesylate] Other (See Comments)    Can not tolerate   Pradaxa [Dabigatran]    Toviaz [Fesoterodine Fumarate Er] Other (See Comments)    Can not tolerate   Vesicare [Solifenacin] Other (See Comments)    Can not tolerate    Allergies as of 04/03/2021       Reactions   Alendronate Other (See Comments)   Can not tolerate   Celebrex [celecoxib] Other (See Comments)   Can not tolerate   Meloxicam Other (See Comments)   Avoid   Myrbetriq [mirabegron] Other (See Comments)   Can not tolerate   Norco [hydrocodone-acetaminophen] Other (See Comments)   Dizziness and jerking   Pradaxa [dabigatran Etexilate Mesylate] Other (See Comments)   Can not tolerate   Pradaxa [dabigatran]    Toviaz [fesoterodine Fumarate Er] Other (See Comments)   Can not tolerate   Vesicare [solifenacin] Other (See Comments)   Can not tolerate        Medication List        Accurate as of April 03, 2021 11:02 AM. If you have any questions, ask your nurse or doctor.          acetaminophen 325 MG tablet Commonly known as: TYLENOL Take 650 mg by mouth 3 (three) times daily.   Calcium Carb-Cholecalciferol 600-800 MG-UNIT Tabs Take 1 tablet by mouth daily.   cholecalciferol 1000 units tablet Commonly known as: VITAMIN D Take 1,000 Units by mouth daily.   denosumab 60 MG/ML Sosy  injection Commonly known as: PROLIA Inject 60 mg into the skin. Once A Day on the 1st of Every 6th Month   gabapentin 300 MG capsule Commonly known as: NEURONTIN TAKE 1 CAPSULE TWICE DAILY   gabapentin 100 MG capsule Commonly known as: NEURONTIN Take 2 capsules (200 mg total) by mouth daily with lunch.   levothyroxine 50 MCG tablet Commonly known as: SYNTHROID TAKE 1 TABLET (50 MCG TOTAL) BY MOUTH DAILY BEFORE BREAKFAST.   loratadine 10 MG tablet Commonly known as: CLARITIN Take 10 mg by mouth daily.   meclizine 25 MG tablet Commonly known as: ANTIVERT Take 25 mg by mouth 3 (three) times daily as needed for dizziness.   memantine 10 MG tablet Commonly known as: NAMENDA Take 1 tablet (10 mg total) by mouth 2 (two) times daily.   omeprazole 20 MG capsule Commonly  known as: PRILOSEC TAKE 1 CAPSULE TWICE DAILY   polyethylene glycol 17 g packet Commonly known as: MIRALAX / GLYCOLAX Take 17 g by mouth daily.   potassium chloride SA 20 MEQ tablet Commonly known as: KLOR-CON Take 20 mEq by mouth daily.   pravastatin 20 MG tablet Commonly known as: PRAVACHOL TAKE 1 TABLET EVERY DAY   PreserVision AREDS 2 Caps Take 1 capsule by mouth 2 (two) times daily.   sertraline 25 MG tablet Commonly known as: ZOLOFT Take 25 mg by mouth at bedtime.   sertraline 50 MG tablet Commonly known as: ZOLOFT Take 1 tablet (50 mg total) by mouth every morning.   torsemide 20 MG tablet Commonly known as: DEMADEX Take 20 mg by mouth daily.   warfarin 5 MG tablet Commonly known as: COUMADIN Take 5 mg by mouth. On Sunday, Tuesday, Wednesday, Thursday, and Saturday at 6pm. EXCEPT Monday and Fridays.   warfarin 6 MG tablet Commonly known as: COUMADIN Take 6 mg by mouth. 6MG , oral, Once A Day on Mon, Fri, warfrin 5mg  everyday EXCEPT MONDAY AND FRIDAYS warfrin 6mg .        Review of Systems  Constitutional:  Negative for activity change, appetite change, chills, fatigue and fever.   HENT:  Positive for hearing loss. Negative for trouble swallowing.   Eyes:  Negative for visual disturbance.  Respiratory:  Negative for cough, shortness of breath and wheezing.   Cardiovascular:  Negative for chest pain and leg swelling.  Gastrointestinal:  Negative for abdominal distention, abdominal pain, blood in stool, constipation, diarrhea and nausea.  Genitourinary:  Negative for dysuria, frequency and hematuria.  Musculoskeletal:  Positive for arthralgias, back pain and gait problem.  Skin: Negative.   Neurological:  Positive for tremors, weakness and headaches. Negative for dizziness.  Psychiatric/Behavioral:  Positive for confusion and dysphoric mood. Negative for sleep disturbance. The patient is not nervous/anxious.    Immunization History  Administered Date(s) Administered   IPV 03/06/2016, 09/27/2016, 10/09/2017, 04/16/2018, 11/03/2018, 07/14/2019, 01/19/2020   Influenza Whole 01/17/2011   Influenza, High Dose Seasonal PF 02/24/2014, 01/05/2015, 01/09/2016, 01/10/2017, 02/25/2019   Influenza,inj,Quad PF,6+ Mos 02/13/2018   Influenza-Unspecified 02/11/2012, 02/14/2013, 02/24/2014, 02/02/2020, 03/07/2021   Moderna SARS-COV2 Booster Vaccination 10/19/2020   Moderna Sars-Covid-2 Vaccination 05/18/2019, 06/15/2019, 03/28/2020   Pneumococcal Conjugate-13 06/30/2014, 03/22/2018   Pneumococcal Polysaccharide-23 02/17/2009, 01/09/2012   Pneumococcal-Unspecified 06/30/2008, 02/17/2009   Td 03/10/2012   Tdap 02/12/2012   Zoster, Live 05/14/2008   Pertinent  Health Maintenance Due  Topic Date Due   INFLUENZA VACCINE  Completed   DEXA SCAN  Discontinued   Fall Risk 06/05/2020 06/06/2020 06/08/2020 06/29/2020 07/05/2020  Falls in the past year? - - 0 0 0  Was there an injury with Fall? - - - - -  Fall Risk Category Calculator - - - - -  Fall Risk Category - - - - -  Patient Fall Risk Level High fall risk High fall risk - - -   Functional Status Survey:    Vitals:   04/03/21  1046  BP: (!) 109/57  Pulse: 72  Resp: 16  Temp: (!) 97 F (36.1 C)  SpO2: 94%  Weight: 131 lb 6.4 oz (59.6 kg)  Height: 5' (1.524 m)   Body mass index is 25.66 kg/m. Physical Exam Vitals reviewed.  Constitutional:      General: She is not in acute distress. HENT:     Head: Normocephalic.  Eyes:     General:  Right eye: No discharge.        Left eye: No discharge.  Neck:     Vascular: No carotid bruit.  Cardiovascular:     Rate and Rhythm: Normal rate. Rhythm irregular.     Pulses: Normal pulses.     Heart sounds: Murmur heard.  Pulmonary:     Effort: Pulmonary effort is normal. No respiratory distress.     Breath sounds: Normal breath sounds. No wheezing.  Abdominal:     General: Bowel sounds are normal. There is no distension.     Palpations: Abdomen is soft.     Tenderness: There is no abdominal tenderness.  Musculoskeletal:     Cervical back: Normal range of motion.     Right lower leg: Edema present.     Left lower leg: Edema present.     Comments: Non pitting  Lymphadenopathy:     Cervical: No cervical adenopathy.  Skin:    General: Skin is warm and dry.     Capillary Refill: Capillary refill takes less than 2 seconds.  Neurological:     General: No focal deficit present.     Mental Status: She is alert. Mental status is at baseline.     Motor: Weakness present.     Gait: Gait abnormal.     Comments: rolator  Psychiatric:        Mood and Affect: Mood normal.        Behavior: Behavior normal.     Comments: Alert, very pleasant, follows commands, trouble giving details about complaints, kept saying " I don't know"    Labs reviewed: Recent Labs    06/04/20 0336 06/05/20 0426 06/06/20 0427 07/21/20 0000 10/20/20 0000 11/24/20 0000 02/14/21 0000  NA 140 138 140   < > 144 144 143  K 3.6 4.2 4.1   < > 3.9 4.0 4.0  CL 107 106 108   < > 106 105 106  CO2 25 27 24    < > 29* 32* 26*  GLUCOSE 149* 145* 153*  --   --   --   --   BUN 28* 26* 30*    < > 32* 39* 34*  CREATININE 0.67 0.80 0.76   < > 0.9 1.1 1.2*  CALCIUM 8.9 9.1 9.1   < > 10.0 9.7 9.2  MG 2.0 2.1 2.2  --   --   --   --    < > = values in this interval not displayed.   Recent Labs    06/04/20 0336 06/05/20 0426 06/06/20 0427 10/20/20 0000 11/24/20 0000 02/14/21 0000  AST 17 16 15  12* 15 14  ALT 16 16 20 7 15 14   ALKPHOS 62 63 56 63 67 73  BILITOT 0.6 0.4 0.2*  --   --   --   PROT 6.1* 5.8* 5.5*  --   --   --   ALBUMIN 3.3* 3.1* 3.0* 4.0 4.1 4.1   Recent Labs    06/04/20 0336 06/05/20 0426 06/06/20 0427 10/20/20 0000 11/24/20 0000 02/14/21 0000  WBC 10.3 10.6* 10.0 6.3 7.6 5.7  NEUTROABS 8.3* 9.3* 8.8* 3,837.00 3,952.00  --   HGB 12.1 12.0 12.2 11.0* 12.7 12.8  HCT 39.2 38.5 37.9 35* 39 39  MCV 93.6 94.1 90.9  --   --   --   PLT 171 169 160 180 194 219   Lab Results  Component Value Date   TSH 1.33 02/14/2021   No results found for: HGBA1C  Lab Results  Component Value Date   CHOL 150 02/06/2021   HDL 58 02/06/2021   LDLCALC 74 02/06/2021   TRIG 76 06/03/2020   CHOLHDL 2.6 10/15/2019    Significant Diagnostic Results in last 30 days:  CUP PACEART REMOTE DEVICE CHECK  Result Date: 03/06/2021 Scheduled remote reviewed. Normal device function.  Numerous AMS/AHR with CVR. Burden 0.7%. + OAC. Next remote 91 days. LH   Assessment/Plan 1. Frequent falls - one reported fall within past month - she is ambulating less, not leaving room often - PT evaluation  2. Frequent headaches - no reports of head injury after fall per nursing - unclear how many headaches have occurred - blood pressures bid x 7 days - advised to track headache incidents x 1 week - cont tylenol 650 mg po tid  3. Paroxysmal atrial fibrillation (HCC) - HR 70-80's, controlled without medication - cont warfarin for clot prevention  4. Edema of both legs - non pitting edema  - cont torsemide  5. Hypothyroidism in adult - TSH normal - cont levothyroxine  6. Cognitive  impairment - has trouble giving details about headaches - no recent behavioral outbursts  7. Mixed incontinence urge and stress - failed trial of Myrbetriq in past  8. Myoclonic jerking - followed by neurology - cont Zoloft and gabapentin  9. Anxiety and depression - mood improved  - cont Zoloft     Family/ staff Communication: plan discussed with patient and nurse  Labs/tests ordered:  none

## 2021-04-11 DIAGNOSIS — Z0000 Encounter for general adult medical examination without abnormal findings: Secondary | ICD-10-CM | POA: Diagnosis not present

## 2021-04-11 DIAGNOSIS — N1831 Chronic kidney disease, stage 3a: Secondary | ICD-10-CM | POA: Diagnosis not present

## 2021-04-11 DIAGNOSIS — M7060 Trochanteric bursitis, unspecified hip: Secondary | ICD-10-CM | POA: Diagnosis not present

## 2021-04-11 DIAGNOSIS — E559 Vitamin D deficiency, unspecified: Secondary | ICD-10-CM | POA: Diagnosis not present

## 2021-04-11 DIAGNOSIS — M810 Age-related osteoporosis without current pathological fracture: Secondary | ICD-10-CM | POA: Diagnosis not present

## 2021-04-11 DIAGNOSIS — M7582 Other shoulder lesions, left shoulder: Secondary | ICD-10-CM | POA: Diagnosis not present

## 2021-04-11 DIAGNOSIS — I10 Essential (primary) hypertension: Secondary | ICD-10-CM | POA: Diagnosis not present

## 2021-04-11 DIAGNOSIS — M1990 Unspecified osteoarthritis, unspecified site: Secondary | ICD-10-CM | POA: Diagnosis not present

## 2021-04-11 DIAGNOSIS — M25512 Pain in left shoulder: Secondary | ICD-10-CM | POA: Diagnosis not present

## 2021-04-11 DIAGNOSIS — M25559 Pain in unspecified hip: Secondary | ICD-10-CM | POA: Diagnosis not present

## 2021-05-03 ENCOUNTER — Encounter: Payer: Self-pay | Admitting: Orthopedic Surgery

## 2021-05-03 NOTE — Progress Notes (Signed)
Location:   [facility] Room Number: 37 Place of Service:  ALF [phone]) Provider:  Windell Moulding, NP  [name], MD  Patient Care Team: [name], MD as PCP - General (Internal Medicine) Jettie Booze, MD as PCP - Cardiology (Cardiology) Thompson Grayer, MD as PCP - Electrophysiology (Cardiology) Blanch Media, MD as Consulting Physician (Neurology) Thompson Grayer, MD as Consulting Physician (Cardiology)  Extended Emergency Contact Information Primary Emergency Contact: Margaree Mackintosh Address: [place], [place] of [place] Phone: [phone] Relation: Spouse Secondary Emergency Contact: [name] Address: [address]          [place], [place] [address] [name] of [place] Phone: [phone] Relation: Daughter  Code Status:  DNR Goals of care: Advanced Directive information Advanced Directives 05/03/2021  Does Patient Have a Medical Advance Directive? Yes  Type of Paramedic of [place];Living will;Out of facility DNR (pink MOST or yellow form)  Does patient want to make changes to medical advance directive? No - Patient declined  Copy of [place] in Chart? Yes - validated most recent copy scanned in chart (See row information)  Would patient like information on creating a medical advance directive? -  Pre-existing out of facility DNR order (yellow form or pink MOST form) Yellow form placed in chart (order not valid for inpatient use)     Chief Complaint  Patient presents with   Medical Management of Chronic Issues    Routine follow up visit.   Health Maintenance    2nd COVID booster    HPI:  Pt is a 87 y.o. female seen today for medical management of chronic diseases.     Past Medical History:  Diagnosis Date   Arthritis    Atrial fibrillation ([facility])    Cataract    removed bilateral   Chronic diastolic CHF  (congestive heart failure) ([facility]) 06/02/2020   Clotting disorder ([facility])    h/o blood clot left leg   Dementia (HCC)    Depression    DVT (deep venous thrombosis) ([facility]) 2011   GERD (gastroesophageal reflux disease)    HB (heart block)    s/p PPM, most recent generator change 12/11 by JA   Heart murmur    Hiatal hernia    Hyperlipidemia    Hypertension    Hypothyroidism    Osteoporosis    PTE (post-transplant erythrocytosis)    Pulmonary embolism ([facility]) 2011   on coumadin   UTI (lower urinary tract infection)    Past Surgical History:  Procedure Laterality Date   BLADDER SURGERY     twice   cataracts     bilateral   CHOLECYSTECTOMY  09/2010   COLONOSCOPY     ESOPHAGOGASTRODUODENOSCOPY     HEMORRHOID SURGERY     hysterectomy (other)     INNER EAR SURGERY     JOINT REPLACEMENT     pacemaker     most recent generator 12/11 by [place] Left     Allergies  Allergen Reactions   Alendronate Other (See Comments)    Can not tolerate   Celebrex [Celecoxib] Other (See Comments)    Can not tolerate   Meloxicam Other (See Comments)    Avoid    Myrbetriq [Mirabegron] Other (See Comments)    Can not tolerate   Norco [Hydrocodone-Acetaminophen] Other (See Comments)    Dizziness and  jerking   Pradaxa [Dabigatran Etexilate Mesylate] Other (See Comments)    Can not tolerate   Pradaxa [Dabigatran]    Toviaz [Fesoterodine Fumarate Er] Other (See Comments)    Can not tolerate   Vesicare [Solifenacin] Other (See Comments)    Can not tolerate    Allergies as of 05/03/2021       Reactions   Alendronate Other (See Comments)   Can not tolerate   Celebrex [celecoxib] Other (See Comments)   Can not tolerate   Meloxicam Other (See Comments)   Avoid   Myrbetriq [mirabegron] Other (See Comments)   Can not tolerate   Norco [hydrocodone-acetaminophen] Other (See Comments)   Dizziness and jerking   Pradaxa [dabigatran Etexilate  Mesylate] Other (See Comments)   Can not tolerate   Pradaxa [dabigatran]    Toviaz [fesoterodine Fumarate Er] Other (See Comments)   Can not tolerate   Vesicare [solifenacin] Other (See Comments)   Can not tolerate        Medication List        Accurate as of May 03, 2021 10:14 AM. If you have any questions, ask your nurse or doctor.          acetaminophen 325 MG tablet Commonly known as: TYLENOL Take 650 mg by mouth 3 (three) times daily.   ammonium lactate 12 % lotion Commonly known as: LAC-HYDRIN Apply 1 application topically as needed for dry skin.   Calcium Carb-Cholecalciferol 600-800 MG-UNIT Tabs Take 1 tablet by mouth daily.   cholecalciferol 1000 units tablet Commonly known as: VITAMIN D Take 1,000 Units by mouth daily.   denosumab 60 MG/ML Sosy injection Commonly known as: PROLIA Inject 60 mg into the skin. Once A Day on the 1st of Every 6th Month   gabapentin 300 MG capsule Commonly known as: NEURONTIN TAKE 1 CAPSULE TWICE DAILY   gabapentin 100 MG capsule Commonly known as: NEURONTIN Take 2 capsules (200 mg total) by mouth daily with lunch.   levothyroxine 50 MCG tablet Commonly known as: SYNTHROID TAKE 1 TABLET (50 MCG TOTAL) BY MOUTH DAILY BEFORE BREAKFAST.   loratadine 10 MG tablet Commonly known as: CLARITIN Take 10 mg by mouth daily.   meclizine 25 MG tablet Commonly known as: ANTIVERT Take 25 mg by mouth 3 (three) times daily as needed for dizziness.   memantine 10 MG tablet Commonly known as: NAMENDA Take 1 tablet (10 mg total) by mouth 2 (two) times daily.   omeprazole 20 MG capsule Commonly known as: PRILOSEC TAKE 1 CAPSULE TWICE DAILY   polyethylene glycol 17 g packet Commonly known as: MIRALAX / GLYCOLAX Take 17 g by mouth daily.   potassium chloride SA 20 MEQ tablet Commonly known as: KLOR-CON M Take 20 mEq by mouth daily.   pravastatin 20 MG tablet Commonly known as: PRAVACHOL TAKE 1 TABLET EVERY DAY    PreserVision AREDS 2 Caps Take 1 capsule by mouth 2 (two) times daily.   sertraline 25 MG tablet Commonly known as: ZOLOFT Take 25 mg by mouth at bedtime.   sertraline 50 MG tablet Commonly known as: ZOLOFT Take 1 tablet (50 mg total) by mouth every morning.   torsemide 20 MG tablet Commonly known as: DEMADEX Take 20 mg by mouth daily.   warfarin 5 MG tablet Commonly known as: COUMADIN Take 5 mg by mouth. On Sunday, Tuesday, Wednesday, Thursday, and Saturday at 6pm. EXCEPT Monday and Fridays.   warfarin 6 MG tablet Commonly known as: COUMADIN Take 6 mg by mouth.  6MG , oral, Once A Day on Mon, Fri, warfrin 5mg  everyday EXCEPT MONDAY AND FRIDAYS warfrin 6mg .        Review of Systems  Immunization History  Administered Date(s) Administered   IPV 03/06/2016, 09/27/2016, 10/09/2017, 04/16/2018, 11/03/2018, 07/14/2019, 01/19/2020   Influenza Whole 01/17/2011   Influenza, High Dose Seasonal PF 02/24/2014, 01/05/2015, 01/09/2016, 01/10/2017, 02/25/2019   Influenza,inj,Quad PF,6+ Mos 02/13/2018   Influenza-Unspecified 02/11/2012, 02/14/2013, 02/24/2014, 02/02/2020, 03/07/2021   Moderna SARS-COV2 Booster Vaccination 10/19/2020   Moderna Sars-Covid-2 Vaccination 05/18/2019, 06/15/2019, 03/28/2020   Pneumococcal Conjugate-13 06/30/2014, 03/22/2018   Pneumococcal Polysaccharide-23 02/17/2009, 01/09/2012   Pneumococcal-Unspecified 06/30/2008, 02/17/2009   Td 03/10/2012   Tdap 02/12/2012   Zoster, Live 05/14/2008   Pertinent  Health Maintenance Due  Topic Date Due   INFLUENZA VACCINE  Completed   DEXA SCAN  Discontinued   Fall Risk 06/05/2020 06/06/2020 06/08/2020 06/29/2020 07/05/2020  Falls in the past year? - - 0 0 0  Was there an injury with Fall? - - - - -  Fall Risk Category Calculator - - - - -  Fall Risk Category - - - - -  Patient Fall Risk Level High fall risk High fall risk - - -   Functional Status Survey:    Vitals:   05/03/21 0926  BP: 126/60   Pulse: 86  Resp: 20  Temp: (!) 97 F (36.1 C)  SpO2: 95%  Weight: 129 lb (58.5 kg)  Height: 5' (1.524 m)   Body mass index is 25.19 kg/m. Physical Exam  Labs reviewed: Recent Labs    06/04/20 0336 06/05/20 0426 06/06/20 0427 07/21/20 0000 10/20/20 0000 11/24/20 0000 02/14/21 0000  NA 140 138 140   < > 144 144 143  K 3.6 4.2 4.1   < > 3.9 4.0 4.0  CL 107 106 108   < > 106 105 106  CO2 25 27 24    < > 29* 32* 26*  GLUCOSE 149* 145* 153*  --   --   --   --   BUN 28* 26* 30*   < > 32* 39* 34*  CREATININE 0.67 0.80 0.76   < > 0.9 1.1 1.2*  CALCIUM 8.9 9.1 9.1   < > 10.0 9.7 9.2  MG 2.0 2.1 2.2  --   --   --   --    < > = values in this interval not displayed.   Recent Labs    06/04/20 0336 06/05/20 0426 06/06/20 0427 10/20/20 0000 11/24/20 0000 02/14/21 0000  AST 17 16 15  12* 15 14  ALT 16 16 20 7 15 14   ALKPHOS 62 63 56 63 67 73  BILITOT 0.6 0.4 0.2*  --   --   --   PROT 6.1* 5.8* 5.5*  --   --   --   ALBUMIN 3.3* 3.1* 3.0* 4.0 4.1 4.1   Recent Labs    06/04/20 0336 06/05/20 0426 06/06/20 0427 10/20/20 0000 11/24/20 0000 02/14/21 0000  WBC 10.3 10.6* 10.0 6.3 7.6 5.7  NEUTROABS 8.3* 9.3* 8.8* 3,837.00 3,952.00  --   HGB 12.1 12.0 12.2 11.0* 12.7 12.8  HCT 39.2 38.5 37.9 35* 39 39  MCV 93.6 94.1 90.9  --   --   --   PLT 171 169 160 180 194 219   Lab Results  Component Value Date   TSH 1.33 02/14/2021   No results found for: HGBA1C Lab Results  Component Value Date   CHOL 150 02/06/2021  HDL 58 02/06/2021   LDLCALC 74 02/06/2021   TRIG 76 06/03/2020   CHOLHDL 2.6 10/15/2019    Significant Diagnostic Results in last 30 days:  No results found.  Assessment/Plan There are no diagnoses linked to this encounter.   Family/ staff Communication:   Labs/tests ordered:      This encounter was created in error - please disregard.

## 2021-05-05 ENCOUNTER — Encounter: Payer: Self-pay | Admitting: Orthopedic Surgery

## 2021-05-05 ENCOUNTER — Non-Acute Institutional Stay: Payer: Medicare Other | Admitting: Orthopedic Surgery

## 2021-05-05 DIAGNOSIS — F419 Anxiety disorder, unspecified: Secondary | ICD-10-CM

## 2021-05-05 DIAGNOSIS — K5901 Slow transit constipation: Secondary | ICD-10-CM | POA: Diagnosis not present

## 2021-05-05 DIAGNOSIS — N3946 Mixed incontinence: Secondary | ICD-10-CM | POA: Diagnosis not present

## 2021-05-05 DIAGNOSIS — R4189 Other symptoms and signs involving cognitive functions and awareness: Secondary | ICD-10-CM | POA: Diagnosis not present

## 2021-05-05 DIAGNOSIS — I5032 Chronic diastolic (congestive) heart failure: Secondary | ICD-10-CM

## 2021-05-05 DIAGNOSIS — I480 Paroxysmal atrial fibrillation: Secondary | ICD-10-CM

## 2021-05-05 DIAGNOSIS — G253 Myoclonus: Secondary | ICD-10-CM

## 2021-05-05 DIAGNOSIS — M810 Age-related osteoporosis without current pathological fracture: Secondary | ICD-10-CM | POA: Diagnosis not present

## 2021-05-05 DIAGNOSIS — F32A Depression, unspecified: Secondary | ICD-10-CM

## 2021-05-05 DIAGNOSIS — Z66 Do not resuscitate: Secondary | ICD-10-CM

## 2021-05-05 NOTE — Progress Notes (Signed)
Location:  [place] Room Number: AL38-A Place of Service:  ALF [phone]) Provider:  Yvonna Alanis, NP   Patient Care Team: [name], MD as PCP - General (Internal Medicine) Jettie Booze, MD as PCP - Cardiology (Cardiology) Thompson Grayer, MD as PCP - Electrophysiology (Cardiology) Blanch Media, MD as Consulting Physician (Neurology) Thompson Grayer, MD as Consulting Physician (Cardiology)  Extended Emergency Contact Information Primary Emergency Contact: Margaree Mackintosh Address: [place], [place] Phone: [phone] Relation: Spouse Secondary Emergency Contact: [name] Address: [address]          [place], [place] [address] [name] of [place] Phone: [phone] Relation: Daughter  Code Status:  DNR Goals of care: Advanced Directive information Advanced Directives 05/05/2021  Does Patient Have a Medical Advance Directive? Yes  Type of Paramedic of [place];Living will;Out of facility DNR (pink MOST or yellow form)  Does patient want to make changes to medical advance directive? No - Patient declined  Copy of [place] in Chart? Yes - validated most recent copy scanned in chart (See row information)  Would patient like information on creating a medical advance directive? -  Pre-existing out of facility DNR order (yellow form or pink MOST form) Yellow form placed in chart (order not valid for inpatient use)     Chief Complaint  Patient presents with   Medical Management of Chronic Issues    Routine visit     HPI:  Pt is a 87 y.o. female seen today for medical management of chronic diseases.    She currently resides on the assisted living unit at [facility]. PMH: AV block 3rd degree, diastolic CHF, afib, dysphagia, GERD, constipation, hypothyroidism, myoclonic jerking, osteoporosis, cognitive impairment, and  depression.   Afib- rate controlled with without medication, HR 70-80's, coumadin for clot prevention  CHF- torsemide daily, weights 3x weekly, NAS diet Hypothyroidism- TSH 1.33 02/12/2021, levothyroxine Cognitive impairment- no behavioral outbursts, MMSE 27/30, Aricept discontinued, remains on namenda Mixed incontinence- denies increased episodes,failed trial of myrbetriq Myoclonic jerking- followed by Dr. Reynolds/neurologist- involuntary movements not believed to be neurodegenerative disorder, advised to continue Zoloft and gabapentin, Daughter interested in f/u with Dr. Doy Mince- cannot get appointment till June 2023, would like Dr. Lyndel Safe opinion, jerking does not wake her up at night, no recent falls or injuries- ambulates with walker Anxiety and depression- no recent panic attacks, denies increased depression, reports missing her husband, daughter thinks she is having increased anxiety- she first noticed around Thanksgiving- also increased jerking began around that time, Zoloft increased last encounter Constipation- increased loose stools past 2 days, would like to hold Miralax Osteoporosis- remains on Prolia per rheumatology, calcium and vitamin D supplement  Recent blood pressures:  12/21- 125/81  12/14- 126/60  12/07- 124/64  Recent weights:  12/21- 129 lbs  11/21- 131.4 lbs  10/21- 132.2 lbs         Past Medical History:  Diagnosis Date   Arthritis    Atrial fibrillation ([place])    Cataract    removed bilateral   Chronic diastolic CHF (congestive heart failure) ([place]) 06/02/2020   Clotting disorder ([place])    h/o blood clot left leg   Dementia ([place])    Depression    DVT (deep venous thrombosis) ([place]) 2011   GERD (gastroesophageal reflux disease)    HB (heart block)  s/p PPM, most recent generator change 12/11 by JA   Heart murmur    Hiatal hernia    Hyperlipidemia    Hypertension    Hypothyroidism    Osteoporosis    PTE (post-transplant erythrocytosis)     Pulmonary embolism ([place]) 2011   on coumadin   UTI (lower urinary tract infection)    Past Surgical History:  Procedure Laterality Date   BLADDER SURGERY     twice   cataracts     bilateral   CHOLECYSTECTOMY  09/2010   COLONOSCOPY     ESOPHAGOGASTRODUODENOSCOPY     HEMORRHOID SURGERY     hysterectomy (other)     INNER EAR SURGERY     JOINT REPLACEMENT     pacemaker     most recent generator 12/11 by [place]     Allergies  Allergen Reactions   Alendronate Other (See Comments)    Can not tolerate   Celebrex [Celecoxib] Other (See Comments)    Can not tolerate   Meloxicam Other (See Comments)    Avoid    Myrbetriq [Mirabegron] Other (See Comments)    Can not tolerate   Norco [Hydrocodone-Acetaminophen] Other (See Comments)    Dizziness and jerking   Pradaxa [Dabigatran Etexilate Mesylate] Other (See Comments)    Can not tolerate   Pradaxa [Dabigatran]    Toviaz [Fesoterodine Fumarate Er] Other (See Comments)    Can not tolerate   Vesicare [Solifenacin] Other (See Comments)    Can not tolerate    Outpatient Encounter Medications as of 05/05/2021  Medication Sig   acetaminophen (TYLENOL) 325 MG tablet Take 650 mg by mouth 3 (three) times daily.   ammonium lactate (LAC-HYDRIN) 12 % lotion Apply 1 application topically as needed for dry skin.   Calcium Carb-Cholecalciferol 600-800 MG-UNIT TABS Take 1 tablet by mouth daily.   cholecalciferol (VITAMIN D) 1000 UNITS tablet Take 1,000 Units by mouth daily.    denosumab (PROLIA) 60 MG/ML SOSY injection Inject 60 mg into the skin. Once A Day on the 1st of Every 6th Month   gabapentin (NEURONTIN) 100 MG capsule Take 2 capsules (200 mg total) by mouth daily with lunch.   gabapentin (NEURONTIN) 300 MG capsule TAKE 1 CAPSULE TWICE DAILY   levothyroxine (SYNTHROID) 50 MCG tablet TAKE 1 TABLET (50 MCG TOTAL) BY MOUTH DAILY BEFORE BREAKFAST.   loratadine (CLARITIN) 10 MG tablet Take 10 mg by mouth daily.    meclizine (ANTIVERT) 25 MG tablet Take 25 mg by mouth 3 (three) times daily as needed for dizziness.   memantine (NAMENDA) 10 MG tablet Take 1 tablet (10 mg total) by mouth 2 (two) times daily.   Multiple Vitamins-Minerals (PRESERVISION AREDS 2) CAPS Take 1 capsule by mouth 2 (two) times daily.   omeprazole (PRILOSEC) 20 MG capsule TAKE 1 CAPSULE TWICE DAILY   polyethylene glycol (MIRALAX / GLYCOLAX) 17 g packet Take 17 g by mouth daily.   potassium chloride SA (KLOR-CON) 20 MEQ tablet Take 20 mEq by mouth daily.   pravastatin (PRAVACHOL) 20 MG tablet TAKE 1 TABLET EVERY DAY   sertraline (ZOLOFT) 50 MG tablet Take 50 mg by mouth as directed. 1 tablet by mouth in the am and 1/2 tablet by mouth in the pm   torsemide (DEMADEX) 20 MG tablet Take 20 mg by mouth daily.   warfarin (COUMADIN) 5 MG tablet Take 5 mg by mouth as directed. On Sunday, Tuesday, Wednesday, Thursday, and Saturday at 6pm. See other  listing   warfarin (COUMADIN) 6 MG tablet Take 6 mg by mouth daily. Monday and Friday see other listing   [DISCONTINUED] colestipol (COLESTID) 5 g granules Take 5 g by mouth daily with supper.   [DISCONTINUED] sertraline (ZOLOFT) 25 MG tablet Take 25 mg by mouth at bedtime.   [DISCONTINUED] sertraline (ZOLOFT) 50 MG tablet Take 1 tablet (50 mg total) by mouth every morning.   No facility-administered encounter medications on file as of 05/05/2021.    Review of Systems  Constitutional:  Negative for activity change, appetite change and fatigue.  HENT:  Positive for hearing loss. Negative for congestion and trouble swallowing.   Eyes:  Negative for visual disturbance.  Respiratory:  Negative for cough, shortness of breath and wheezing.   Cardiovascular:  Negative for chest pain and leg swelling.  Gastrointestinal:  Positive for diarrhea. Negative for abdominal distention, abdominal pain, constipation, nausea and vomiting.  Genitourinary:  Negative for dysuria, frequency and hematuria.   Musculoskeletal:  Positive for arthralgias and gait problem.  Skin:  Negative for rash and wound.  Neurological:  Positive for tremors, weakness and numbness. Negative for dizziness and headaches.  Psychiatric/Behavioral:  Positive for confusion. Negative for dysphoric mood and sleep disturbance. The patient is nervous/anxious.    Immunization History  Administered Date(s) Administered   IPV 03/06/2016, 09/27/2016, 10/09/2017, 04/16/2018, 11/03/2018, 07/14/2019, 01/19/2020   Influenza Whole 01/17/2011   Influenza, High Dose Seasonal PF 02/24/2014, 01/05/2015, 01/09/2016, 01/10/2017, 02/25/2019   Influenza,inj,Quad PF,6+ Mos 02/13/2018   Influenza-Unspecified 02/11/2012, 02/14/2013, 02/24/2014, 02/02/2020, 03/07/2021   Moderna SARS-COV2 Booster Vaccination 10/19/2020   Moderna Sars-Covid-2 Vaccination 05/18/2019, 06/15/2019, 03/28/2020   Pfizer Covid-19 Vaccine Bivalent Booster 30yrs & up 02/01/2021   Pneumococcal Conjugate-13 06/30/2014, 03/22/2018   Pneumococcal Polysaccharide-23 02/17/2009, 01/09/2012   Pneumococcal-Unspecified 06/30/2008, 02/17/2009   Td 03/10/2012   Tdap 02/12/2012   Zoster, Live 05/14/2008   Pertinent  Health Maintenance Due  Topic Date Due   INFLUENZA VACCINE  Completed   DEXA SCAN  Discontinued   Fall Risk 06/05/2020 06/06/2020 06/08/2020 06/29/2020 07/05/2020  Falls in the past year? - - 0 0 0  Was there an injury with Fall? - - - - -  Fall Risk Category Calculator - - - - -  Fall Risk Category - - - - -  Patient Fall Risk Level High fall risk High fall risk - - -   Functional Status Survey:    Vitals:   05/05/21 0952  BP: 125/61  Pulse: 77  Resp: 14  Temp: 97.7 F (36.5 C)  SpO2: 97%  Weight: 128 lb 9.6 oz (58.3 kg)  Height: 5' (1.524 m)   Body mass index is 25.12 kg/m. Physical Exam Vitals reviewed.  Constitutional:      General: She is not in acute distress. HENT:     Head: Normocephalic.     Right Ear: There is no impacted cerumen.      Left Ear: There is no impacted cerumen.     Ears:     Comments: Bilateral hearing aids    Nose: Nose normal.     Mouth/Throat:     Mouth: Mucous membranes are moist.  Eyes:     General:        Right eye: No discharge.        Left eye: No discharge.     Comments: glasses  Neck:     Vascular: No carotid bruit.  Cardiovascular:     Rate and Rhythm: Normal rate. Rhythm irregular.  Pulses: Normal pulses.     Heart sounds: Murmur heard.  Pulmonary:     Effort: Pulmonary effort is normal. No respiratory distress.     Breath sounds: Normal breath sounds. No wheezing.  Abdominal:     General: Bowel sounds are normal. There is no distension.     Palpations: Abdomen is soft.     Tenderness: There is no abdominal tenderness.  Musculoskeletal:     Cervical back: Normal range of motion.     Right lower leg: No edema.     Left lower leg: No edema.  Lymphadenopathy:     Cervical: No cervical adenopathy.  Skin:    General: Skin is warm and dry.     Capillary Refill: Capillary refill takes less than 2 seconds.  Neurological:     General: No focal deficit present.     Mental Status: She is alert and oriented to person, place, and time.     Motor: Weakness present.     Gait: Gait abnormal.     Comments: Involuntary jerking observed during exam  Psychiatric:        Mood and Affect: Mood normal.        Behavior: Behavior normal.        Cognition and Memory: Memory is impaired.     Comments: Very pleasant, follows commands, Alert to self, place, person    Labs reviewed: Recent Labs    06/04/20 0336 06/05/20 0426 06/06/20 0427 07/21/20 0000 10/20/20 0000 11/24/20 0000 02/14/21 0000  NA 140 138 140   < > 144 144 143  K 3.6 4.2 4.1   < > 3.9 4.0 4.0  CL 107 106 108   < > 106 105 106  CO2 25 27 24    < > 29* 32* 26*  GLUCOSE 149* 145* 153*  --   --   --   --   BUN 28* 26* 30*   < > 32* 39* 34*  CREATININE 0.67 0.80 0.76   < > 0.9 1.1 1.2*  CALCIUM 8.9 9.1 9.1   < > 10.0 9.7 9.2   MG 2.0 2.1 2.2  --   --   --   --    < > = values in this interval not displayed.   Recent Labs    06/04/20 0336 06/05/20 0426 06/06/20 0427 10/20/20 0000 11/24/20 0000 02/14/21 0000  AST 17 16 15  12* 15 14  ALT 16 16 20 7 15 14   ALKPHOS 62 63 56 63 67 73  BILITOT 0.6 0.4 0.2*  --   --   --   PROT 6.1* 5.8* 5.5*  --   --   --   ALBUMIN 3.3* 3.1* 3.0* 4.0 4.1 4.1   Recent Labs    06/04/20 0336 06/05/20 0426 06/06/20 0427 10/20/20 0000 11/24/20 0000 02/14/21 0000  WBC 10.3 10.6* 10.0 6.3 7.6 5.7  NEUTROABS 8.3* 9.3* 8.8* 3,837.00 3,952.00  --   HGB 12.1 12.0 12.2 11.0* 12.7 12.8  HCT 39.2 38.5 37.9 35* 39 39  MCV 93.6 94.1 90.9  --   --   --   PLT 171 169 160 180 194 219   Lab Results  Component Value Date   TSH 1.33 02/14/2021   No results found for: HGBA1C Lab Results  Component Value Date   CHOL 150 02/06/2021   HDL 58 02/06/2021   LDLCALC 74 02/06/2021   TRIG 76 06/03/2020   CHOLHDL 2.6 10/15/2019    Significant Diagnostic Results  in last 30 days:  No results found.  Assessment/Plan 1. Do not resuscitate  2. Paroxysmal atrial fibrillation (HCC) - HR controlled without medication - cont coumadin for clot prevention  3. Chronic diastolic CHF (congestive heart failure) (HCC) - no weight fluctuations, sob or edema - cont torsemide - cont weights 3x/week  4. Cognitive impairment - no behavioral outbursts - cont assisted living care  5. Mixed incontinence urge and stress - failed trial of Myrbetriq in past  6. Myoclonic jerking - followed by neurology - reports increased jerking in past 2 months - daughter believes it is related to increased anxiety - no recent falls, sleeping well - cont zoloft and gabapentin  7. Anxiety and depression - increased anxiety per daughter - no panic attacks - cont zoloft- recently increased  8. Slow transit constipation - loose stools x 2 days - hold Miralax x 3 days  9. Age-related osteoporosis without  current pathological fracture - remains on Prolia per rheumatology - cont calcium and vitamin D supplements     Family/ staff Communication: plan discussed with patient, daughter and nurse  Labs/tests ordered:  none

## 2021-05-11 ENCOUNTER — Encounter: Payer: Self-pay | Admitting: Internal Medicine

## 2021-05-11 ENCOUNTER — Non-Acute Institutional Stay: Payer: Medicare Other | Admitting: Internal Medicine

## 2021-05-11 DIAGNOSIS — I480 Paroxysmal atrial fibrillation: Secondary | ICD-10-CM

## 2021-05-11 DIAGNOSIS — M810 Age-related osteoporosis without current pathological fracture: Secondary | ICD-10-CM

## 2021-05-11 DIAGNOSIS — R5383 Other fatigue: Secondary | ICD-10-CM

## 2021-05-11 DIAGNOSIS — F32A Depression, unspecified: Secondary | ICD-10-CM | POA: Diagnosis not present

## 2021-05-11 DIAGNOSIS — F419 Anxiety disorder, unspecified: Secondary | ICD-10-CM | POA: Diagnosis not present

## 2021-05-11 DIAGNOSIS — G253 Myoclonus: Secondary | ICD-10-CM | POA: Diagnosis not present

## 2021-05-11 DIAGNOSIS — N3946 Mixed incontinence: Secondary | ICD-10-CM

## 2021-05-11 DIAGNOSIS — I5032 Chronic diastolic (congestive) heart failure: Secondary | ICD-10-CM

## 2021-05-11 DIAGNOSIS — R4189 Other symptoms and signs involving cognitive functions and awareness: Secondary | ICD-10-CM | POA: Diagnosis not present

## 2021-05-11 NOTE — Progress Notes (Signed)
Location:   [facility] Room Number: 38 Place of Service:  ALF [phone]) Provider:  Veleta Miners MD  [name], MD  Patient Care Team: [name], MD as PCP - General (Internal Medicine) Jettie Booze, MD as PCP - Cardiology (Cardiology) Thompson Grayer, MD as PCP - Electrophysiology (Cardiology) Blanch Media, MD as Consulting Physician (Neurology) Thompson Grayer, MD as Consulting Physician (Cardiology)  Extended Emergency Contact Information Primary Emergency Contact: Margaree Mackintosh Address: [place], [place] Phone: [phone] Relation: Spouse Secondary Emergency Contact: [name] Address: [address]          [place], [place] [address] [name] of [place] Phone: [phone] Relation: Daughter  Code Status:  DNR Goals of care: Advanced Directive information Advanced Directives 05/11/2021  Does Patient Have a Medical Advance Directive? Yes  Type of Paramedic of [place];Living will;Out of facility DNR (pink MOST or yellow form)  Does patient want to make changes to medical advance directive? No - Patient declined  Copy of [place] in Chart? Yes - validated most recent copy scanned in chart (See row information)  Would patient like information on creating a medical advance directive? -  Pre-existing out of facility DNR order (yellow form or pink MOST form) Yellow form placed in chart (order not valid for inpatient use)     Chief Complaint  Patient presents with   Acute Visit    Jerks    HPI:  Pt is a 87 y.o. female seen today for an acute visit for Evaluation for Jerking movements and increased urination   Patient has a history of CHB s/p pacemaker,  PAF on Coumadin, moderate AS Also has history of cognitive impairment,  Osteoporosis on Prolia per rheumatology Urinary incontinence Chronic diarrhea  followed by GI  Lost her husband in 09/22  Patient has h/o Myoclonic Jerks Per Dr Doy Mince Neurologist at [facility] note from 07/19 She has h/o Myoclonic Jerks. He has tried different regimes. And now has her on Zoloft and Neurontin.He does not think she has NeuroDegenerative disorder and think it is functional Cannot Reduce Zoloft or Neurontin  She has been stable but recently has started complaining that her jerks are worse. Though not noticed by the staff or Daughter.  Daughter has made appointment with [facility] but cant get in till June. She wanted to know if there is anything else I can do for her Her Zoloft was increased few months ago for depression She also has increased urinary incontinence with accidents. On Torsemide for LE edema  Patient seen in her room. C/o Feeling tired and Fatigue Is getting Therapy. Does c/o Depression and  anxiety due to loss of her husband Jerking noticed when I mentioned him and her follow up with Neurology. Per Nurses continues to be independent in her ADLS Past Medical History:  Diagnosis Date   Arthritis    Atrial fibrillation ([place])    Cataract    removed bilateral   Chronic diastolic CHF (congestive heart failure) ([place]) 06/02/2020   Clotting disorder ([place])    h/o blood clot left leg   Dementia (HCC)    Depression    DVT (deep venous thrombosis) ([place]) 2011   GERD (gastroesophageal reflux disease)    HB (heart block)    s/p PPM, most recent generator change 12/11 by JA   Heart murmur  Hiatal hernia    Hyperlipidemia    Hypertension    Hypothyroidism    Osteoporosis    PTE (post-transplant erythrocytosis)    Pulmonary embolism ([place]) 2011   on coumadin   UTI (lower urinary tract infection)    Past Surgical History:  Procedure Laterality Date   BLADDER SURGERY     twice   cataracts     bilateral   CHOLECYSTECTOMY  09/2010   COLONOSCOPY     ESOPHAGOGASTRODUODENOSCOPY     HEMORRHOID SURGERY     hysterectomy (other)     INNER EAR  SURGERY     JOINT REPLACEMENT     pacemaker     most recent generator 12/11 by JA   TOTAL KNEE ARTHROPLASTY Left     Allergies  Allergen Reactions   Alendronate Other (See Comments)    Can not tolerate   Celebrex [Celecoxib] Other (See Comments)    Can not tolerate   Meloxicam Other (See Comments)    Avoid    Myrbetriq [Mirabegron] Other (See Comments)    Can not tolerate   Norco [Hydrocodone-Acetaminophen] Other (See Comments)    Dizziness and jerking   Pradaxa [Dabigatran Etexilate Mesylate] Other (See Comments)    Can not tolerate   Pradaxa [Dabigatran]    Toviaz [Fesoterodine Fumarate Er] Other (See Comments)    Can not tolerate   Vesicare [Solifenacin] Other (See Comments)    Can not tolerate    Allergies as of 05/11/2021       Reactions   Alendronate Other (See Comments)   Can not tolerate   Celebrex [celecoxib] Other (See Comments)   Can not tolerate   Meloxicam Other (See Comments)   Avoid   Myrbetriq [mirabegron] Other (See Comments)   Can not tolerate   Norco [hydrocodone-acetaminophen] Other (See Comments)   Dizziness and jerking   Pradaxa [dabigatran Etexilate Mesylate] Other (See Comments)   Can not tolerate   Pradaxa [dabigatran]    Toviaz [fesoterodine Fumarate Er] Other (See Comments)   Can not tolerate   Vesicare [solifenacin] Other (See Comments)   Can not tolerate        Medication List        Accurate as of May 11, 2021  2:17 PM. If you have any questions, ask your nurse or doctor.          acetaminophen 325 MG tablet Commonly known as: TYLENOL Take 650 mg by mouth 3 (three) times daily.   ammonium lactate 12 % lotion Commonly known as: LAC-HYDRIN Apply 1 application topically as needed for dry skin.   Calcium Carb-Cholecalciferol 600-800 MG-UNIT Tabs Take 1 tablet by mouth daily.   cholecalciferol 1000 units tablet Commonly known as: VITAMIN D Take 1,000 Units by mouth daily.   denosumab 60 MG/ML Sosy  injection Commonly known as: PROLIA Inject 60 mg into the skin. Once A Day on the 1st of Every 6th Month   gabapentin 300 MG capsule Commonly known as: NEURONTIN TAKE 1 CAPSULE TWICE DAILY   gabapentin 100 MG capsule Commonly known as: NEURONTIN Take 2 capsules (200 mg total) by mouth daily with lunch.   levothyroxine 50 MCG tablet Commonly known as: SYNTHROID TAKE 1 TABLET (50 MCG TOTAL) BY MOUTH DAILY BEFORE BREAKFAST.   loratadine 10 MG tablet Commonly known as: CLARITIN Take 10 mg by mouth daily.   meclizine 25 MG tablet Commonly known as: ANTIVERT Take 25 mg by mouth 3 (three) times daily as needed for dizziness.   memantine  10 MG tablet Commonly known as: NAMENDA Take 1 tablet (10 mg total) by mouth 2 (two) times daily.   omeprazole 20 MG capsule Commonly known as: PRILOSEC TAKE 1 CAPSULE TWICE DAILY   polyethylene glycol 17 g packet Commonly known as: MIRALAX / GLYCOLAX Take 17 g by mouth daily.   potassium chloride SA 20 MEQ tablet Commonly known as: KLOR-CON M Take 20 mEq by mouth daily.   pravastatin 20 MG tablet Commonly known as: PRAVACHOL TAKE 1 TABLET EVERY DAY   PreserVision AREDS 2 Caps Take 1 capsule by mouth 2 (two) times daily.   sertraline 50 MG tablet Commonly known as: ZOLOFT Take 50 mg by mouth as directed. 1 tablet by mouth in the am and 1/2 tablet by mouth in the pm   torsemide 20 MG tablet Commonly known as: DEMADEX Take 20 mg by mouth daily.   warfarin 5 MG tablet Commonly known as: COUMADIN Take 5 mg by mouth as directed. On Sunday, Tuesday, Wednesday, Thursday, and Saturday at 6pm. See other listing   warfarin 6 MG tablet Commonly known as: COUMADIN Take 6 mg by mouth daily. Monday and Friday see other listing        Review of Systems  Constitutional:  Negative for activity change and appetite change.  HENT: Negative.    Respiratory:  Negative for cough and shortness of breath.   Cardiovascular:  Positive for leg  swelling.  Gastrointestinal:  Positive for constipation.  Genitourinary:  Positive for frequency and urgency. Negative for dysuria.  Musculoskeletal:  Positive for gait problem. Negative for arthralgias and myalgias.  Skin: Negative.   Neurological:  Positive for weakness. Negative for dizziness.  Psychiatric/Behavioral:  Positive for confusion and dysphoric mood. Negative for sleep disturbance.    Immunization History  Administered Date(s) Administered   IPV 03/06/2016, 09/27/2016, 10/09/2017, 04/16/2018, 11/03/2018, 07/14/2019, 01/19/2020   Influenza Whole 01/17/2011   Influenza, High Dose Seasonal PF 02/24/2014, 01/05/2015, 01/09/2016, 01/10/2017, 02/25/2019   Influenza,inj,Quad PF,6+ Mos 02/13/2018   Influenza-Unspecified 02/11/2012, 02/14/2013, 02/24/2014, 02/02/2020, 03/07/2021   Moderna SARS-COV2 Booster Vaccination 10/19/2020   Moderna Sars-Covid-2 Vaccination 05/18/2019, 06/15/2019, 03/28/2020   Pfizer Covid-19 Vaccine Bivalent Booster 58yrs & up 02/01/2021   Pneumococcal Conjugate-13 06/30/2014, 03/22/2018   Pneumococcal Polysaccharide-23 02/17/2009, 01/09/2012   Pneumococcal-Unspecified 06/30/2008, 02/17/2009   Td 03/10/2012   Tdap 02/12/2012   Zoster, Live 05/14/2008   Pertinent  Health Maintenance Due  Topic Date Due   INFLUENZA VACCINE  Completed   DEXA SCAN  Discontinued   Fall Risk 06/05/2020 06/06/2020 06/08/2020 06/29/2020 07/05/2020  Falls in the past year? - - 0 0 0  Was there an injury with Fall? - - - - -  Fall Risk Category Calculator - - - - -  Fall Risk Category - - - - -  Patient Fall Risk Level High fall risk High fall risk - - -   Functional Status Survey:    Vitals:   05/11/21 1409  BP: 127/68  Pulse: 82  Resp: 19  Temp: 97.7 F (36.5 C)  SpO2: 95%  Weight: 126 lb 12.8 oz (57.5 kg)  Height: 5' (1.524 m)   Body mass index is 24.76 kg/m. Physical Exam Vitals reviewed.  Constitutional:      Appearance: Normal appearance.  HENT:     Head:  Normocephalic.     Nose: Nose normal.     Mouth/Throat:     Mouth: Mucous membranes are moist.     Pharynx: Oropharynx is clear.  Eyes:  Pupils: Pupils are equal, round, and reactive to light.  Cardiovascular:     Rate and Rhythm: Normal rate and regular rhythm.     Pulses: Normal pulses.     Heart sounds: Murmur heard.  Pulmonary:     Effort: Pulmonary effort is normal.     Breath sounds: Normal breath sounds.  Abdominal:     General: Abdomen is flat. Bowel sounds are normal.     Palpations: Abdomen is soft.  Musculoskeletal:        General: No swelling.     Cervical back: Neck supple.  Skin:    General: Skin is warm.  Neurological:     General: No focal deficit present.     Mental Status: She is alert.  Psychiatric:        Mood and Affect: Mood normal.        Thought Content: Thought content normal.    Labs reviewed: Recent Labs    06/04/20 0336 06/05/20 0426 06/06/20 0427 07/21/20 0000 10/20/20 0000 11/24/20 0000 02/14/21 0000  NA 140 138 140   < > 144 144 143  K 3.6 4.2 4.1   < > 3.9 4.0 4.0  CL 107 106 108   < > 106 105 106  CO2 25 27 24    < > 29* 32* 26*  GLUCOSE 149* 145* 153*  --   --   --   --   BUN 28* 26* 30*   < > 32* 39* 34*  CREATININE 0.67 0.80 0.76   < > 0.9 1.1 1.2*  CALCIUM 8.9 9.1 9.1   < > 10.0 9.7 9.2  MG 2.0 2.1 2.2  --   --   --   --    < > = values in this interval not displayed.   Recent Labs    06/04/20 0336 06/05/20 0426 06/06/20 0427 10/20/20 0000 11/24/20 0000 02/14/21 0000  AST 17 16 15  12* 15 14  ALT 16 16 20 7 15 14   ALKPHOS 62 63 56 63 67 73  BILITOT 0.6 0.4 0.2*  --   --   --   PROT 6.1* 5.8* 5.5*  --   --   --   ALBUMIN 3.3* 3.1* 3.0* 4.0 4.1 4.1   Recent Labs    06/04/20 0336 06/05/20 0426 06/06/20 0427 10/20/20 0000 11/24/20 0000 02/14/21 0000  WBC 10.3 10.6* 10.0 6.3 7.6 5.7  NEUTROABS 8.3* 9.3* 8.8* 3,837.00 3,952.00  --   HGB 12.1 12.0 12.2 11.0* 12.7 12.8  HCT 39.2 38.5 37.9 35* 39 39  MCV 93.6  94.1 90.9  --   --   --   PLT 171 169 160 180 194 219   Lab Results  Component Value Date   TSH 1.33 02/14/2021   No results found for: HGBA1C Lab Results  Component Value Date   CHOL 150 02/06/2021   HDL 58 02/06/2021   LDLCALC 74 02/06/2021   TRIG 76 06/03/2020   CHOLHDL 2.6 10/15/2019    Significant Diagnostic Results in last 30 days:  No results found.  Assessment/Plan Myoclonic jerking Discussed with the Daughter At this time I am hesitant to change her meds. I had tried Klonopin in the past and she has gotten very sleepy/Lethargic I feel she is stable with Zoloft and Gabapentin and any change can make her more lethargic and unable to do her ADLS Daughter agreed to do just support right now Continues to walk with her walker No Falls Therapy working  with her Will Keep follow up with [facility] if symptoms get worse Mixed incontinence urge and stress Per daughter big issue for her Will try Ditropan 5 mg Chronic diastolic CHF (congestive heart failure) ([place]) Will reduce torsemide to 3/week to help with Urinary frequency Paroxysmal atrial fibrillation (HCC) On Coumadin Cognitive impairment On Namenda Off Aricept due to Diarrhea Doing well. Staying in AL Anxiety and depression On Zoloft Age-related osteoporosis without current pathological fracture On prolia Other fatigue Will check Labs  Hypothyroidism in adult TSH normal in 6/22 HLD on Statin Family/ staff Communication: Daughter  Labs/tests ordered:

## 2021-05-18 DIAGNOSIS — Z961 Presence of intraocular lens: Secondary | ICD-10-CM | POA: Diagnosis not present

## 2021-05-18 DIAGNOSIS — L814 Other melanin hyperpigmentation: Secondary | ICD-10-CM | POA: Diagnosis not present

## 2021-05-18 DIAGNOSIS — D692 Other nonthrombocytopenic purpura: Secondary | ICD-10-CM | POA: Diagnosis not present

## 2021-05-18 DIAGNOSIS — L821 Other seborrheic keratosis: Secondary | ICD-10-CM | POA: Diagnosis not present

## 2021-05-18 DIAGNOSIS — L570 Actinic keratosis: Secondary | ICD-10-CM | POA: Diagnosis not present

## 2021-05-18 DIAGNOSIS — D649 Anemia, unspecified: Secondary | ICD-10-CM | POA: Diagnosis not present

## 2021-05-18 DIAGNOSIS — H353231 Exudative age-related macular degeneration, bilateral, with active choroidal neovascularization: Secondary | ICD-10-CM | POA: Diagnosis not present

## 2021-05-19 DIAGNOSIS — F330 Major depressive disorder, recurrent, mild: Secondary | ICD-10-CM | POA: Diagnosis not present

## 2021-05-19 DIAGNOSIS — R419 Unspecified symptoms and signs involving cognitive functions and awareness: Secondary | ICD-10-CM | POA: Diagnosis not present

## 2021-05-19 DIAGNOSIS — F411 Generalized anxiety disorder: Secondary | ICD-10-CM | POA: Diagnosis not present

## 2021-05-29 DIAGNOSIS — Z7901 Long term (current) use of anticoagulants: Secondary | ICD-10-CM | POA: Diagnosis not present

## 2021-06-22 ENCOUNTER — Non-Acute Institutional Stay: Payer: Medicare Other | Admitting: Internal Medicine

## 2021-06-22 ENCOUNTER — Encounter: Payer: Self-pay | Admitting: Internal Medicine

## 2021-06-22 DIAGNOSIS — I480 Paroxysmal atrial fibrillation: Secondary | ICD-10-CM | POA: Diagnosis not present

## 2021-06-22 DIAGNOSIS — R0989 Other specified symptoms and signs involving the circulatory and respiratory systems: Secondary | ICD-10-CM

## 2021-06-22 DIAGNOSIS — G253 Myoclonus: Secondary | ICD-10-CM | POA: Diagnosis not present

## 2021-06-22 DIAGNOSIS — N3946 Mixed incontinence: Secondary | ICD-10-CM | POA: Diagnosis not present

## 2021-06-22 DIAGNOSIS — I5032 Chronic diastolic (congestive) heart failure: Secondary | ICD-10-CM

## 2021-06-22 DIAGNOSIS — L570 Actinic keratosis: Secondary | ICD-10-CM | POA: Diagnosis not present

## 2021-06-22 NOTE — Progress Notes (Signed)
Location: [facility] Room Number: 71 Place of Service:  ALF [phone])  Provider: Veleta Miners MD  Code Status: DNR Goals of Care:  Advanced Directives 06/22/2021  Does Patient Have a Medical Advance Directive? Yes  Type of Paramedic of [place];Living will;Out of facility DNR (pink MOST or yellow form)  Does patient want to make changes to medical advance directive? No - Patient declined  Copy of [place] in Chart? Yes - validated most recent copy scanned in chart (See row information)  Would patient like information on creating a medical advance directive? -  Pre-existing out of facility DNR order (yellow form or pink MOST form) Yellow form placed in chart (order not valid for inpatient use)     Chief Complaint  Patient presents with   Acute Visit    Rash    HPI: Patient is a 88 y.o. female seen today for an acute visit for Spot on her Right Breast Also c/o Cough   Patient has a history of CHB s/p pacemaker,  PAF on Coumadin, moderate AS Also has history of cognitive impairment,  Osteoporosis on Prolia per rheumatology Urinary incontinence Chronic diarrhea followed by GI  Patient has h/o Myoclonic Jerks Per Dr Doy Mince Neurologist at [facility] note from 07/19 She has h/o Myoclonic Jerks. He has tried different regimes. And now has her on Zoloft and Neurontin.He does not think she has NeuroDegenerative disorder and think it is functional Cannot Reduce Zoloft or Neurontin  Cough and Rhinorrhea Patient c/o Cough for last 2 weeks. No Fever or chest pain  Coughing Clear mucus Mild SOB No Chest pain or fever   Skin Lesion noticed below her Right breast by nurse Patient says she has had this for many years  Past Medical History:  Diagnosis Date   Arthritis    Atrial fibrillation ([place])    Cataract    removed bilateral   Chronic diastolic CHF (congestive heart failure) ([place]) 06/02/2020   Clotting disorder ([place])     h/o blood clot left leg   Dementia ([place])    Depression    DVT (deep venous thrombosis) ([place]) 2011   GERD (gastroesophageal reflux disease)    HB (heart block)    s/p PPM, most recent generator change 12/11 by JA   Heart murmur    Hiatal hernia    Hyperlipidemia    Hypertension    Hypothyroidism    Osteoporosis    PTE (post-transplant erythrocytosis)    Pulmonary embolism ([place]) 2011   on coumadin   UTI (lower urinary tract infection)     Past Surgical History:  Procedure Laterality Date   BLADDER SURGERY     twice   cataracts     bilateral   CHOLECYSTECTOMY  09/2010   COLONOSCOPY     ESOPHAGOGASTRODUODENOSCOPY     HEMORRHOID SURGERY     hysterectomy (other)     INNER EAR SURGERY     JOINT REPLACEMENT     pacemaker     most recent generator 12/11 by JA   TOTAL KNEE ARTHROPLASTY Left     Allergies  Allergen Reactions   Alendronate Other (See Comments)    Can not tolerate   Celebrex [Celecoxib] Other (See Comments)    Can not tolerate   Meloxicam Other (See Comments)    Avoid    Myrbetriq [Mirabegron] Other (See Comments)    Can not tolerate   Norco [Hydrocodone-Acetaminophen] Other (See Comments)    Dizziness  and jerking   Pradaxa [Dabigatran Etexilate Mesylate] Other (See Comments)    Can not tolerate   Pradaxa [Dabigatran]    Toviaz [Fesoterodine Fumarate Er] Other (See Comments)    Can not tolerate   Vesicare [Solifenacin] Other (See Comments)    Can not tolerate    Outpatient Encounter Medications as of 06/22/2021  Medication Sig   acetaminophen (TYLENOL) 325 MG tablet Take 650 mg by mouth 3 (three) times daily.   ammonium lactate (LAC-HYDRIN) 12 % lotion Apply 1 application topically as needed for dry skin.   Calcium Carb-Cholecalciferol 600-800 MG-UNIT TABS Take 1 tablet by mouth daily.   cholecalciferol (VITAMIN D) 1000 UNITS tablet Take 1,000 Units by mouth daily.    denosumab (PROLIA) 60 MG/ML SOSY injection Inject 60 mg into the skin. Once A Day on  the 1st of Every 6th Month   dextromethorphan-guaiFENesin (MUCINEX DM) 30-600 MG 12hr tablet Take 1 tablet by mouth 2 (two) times daily.   gabapentin (NEURONTIN) 100 MG capsule Take 2 capsules (200 mg total) by mouth daily with lunch.   gabapentin (NEURONTIN) 300 MG capsule TAKE 1 CAPSULE TWICE DAILY   levothyroxine (SYNTHROID) 50 MCG tablet TAKE 1 TABLET (50 MCG TOTAL) BY MOUTH DAILY BEFORE BREAKFAST.   loratadine (CLARITIN) 10 MG tablet Take 10 mg by mouth daily.   meclizine (ANTIVERT) 25 MG tablet Take 25 mg by mouth 3 (three) times daily as needed for dizziness.   memantine (NAMENDA) 10 MG tablet Take 1 tablet (10 mg total) by mouth 2 (two) times daily.   Multiple Vitamins-Minerals (PRESERVISION AREDS 2) CAPS Take 1 capsule by mouth 2 (two) times daily.   omeprazole (PRILOSEC) 20 MG capsule TAKE 1 CAPSULE TWICE DAILY   oxybutynin (DITROPAN-XL) 5 MG 24 hr tablet Take 5 mg by mouth daily.   polyethylene glycol (MIRALAX / GLYCOLAX) 17 g packet Take 17 g by mouth daily.   potassium chloride SA (KLOR-CON) 20 MEQ tablet Take 20 mEq by mouth. Once A Day on Mon, Wed, Fri   pravastatin (PRAVACHOL) 20 MG tablet TAKE 1 TABLET EVERY DAY   [START ON 06/30/2021] predniSONE (DELTASONE) 10 MG tablet Take 10 mg by mouth daily with breakfast.   [START ON 06/27/2021] predniSONE (DELTASONE) 10 MG tablet Take 20 mg by mouth daily with breakfast. For 3 days   [START ON 06/23/2021] predniSONE (DELTASONE) 10 MG tablet Take 10 mg by mouth daily with breakfast. For 3 days   [START ON 07/02/2021] predniSONE (DELTASONE) 5 MG tablet Take 5 mg by mouth daily with breakfast.   sertraline (ZOLOFT) 50 MG tablet Take 50 mg by mouth as directed. 1 tablet by mouth in the am and 1/2 tablet by mouth in the pm   torsemide (DEMADEX) 20 MG tablet Take 20 mg by mouth 3 (three) times a week.   warfarin (COUMADIN) 5 MG tablet Take 5 mg by mouth as directed. On Sunday, Tuesday, Wednesday, Thursday, and Saturday at 6pm. See other listing    warfarin (COUMADIN) 6 MG tablet Take 6 mg by mouth daily. Monday and Friday see other listing   No facility-administered encounter medications on file as of 06/22/2021.    Review of Systems:  Review of Systems  Constitutional:  Negative for activity change and appetite change.  HENT: Negative.    Respiratory:  Positive for cough and shortness of breath.   Cardiovascular:  Positive for leg swelling.  Gastrointestinal:  Negative for constipation.  Genitourinary:  Positive for frequency.  Musculoskeletal:  Positive for  gait problem. Negative for arthralgias and myalgias.  Skin: Negative.   Neurological:  Positive for weakness. Negative for dizziness.  Psychiatric/Behavioral:  Positive for confusion and dysphoric mood. Negative for sleep disturbance.    Health Maintenance  Topic Date Due   Zoster Vaccines- Shingrix (1 of 2) Never done   TETANUS/TDAP  03/10/2022   Pneumonia Vaccine 30+ Years old  Completed   INFLUENZA VACCINE  Completed   COVID-19 Vaccine  Completed   HPV VACCINES  Aged Out   DEXA SCAN  Discontinued    Physical Exam: Vitals:   06/22/21 1518  BP: 134/72  Pulse: 77  Resp: 16  Temp: (!) 97.1 F (36.2 C)  SpO2: 95%  Weight: 131 lb 9.6 oz (59.7 kg)  Height: 5' (1.524 m)   Body mass index is 25.7 kg/m. Physical Exam Vitals reviewed.  Constitutional:      Appearance: Normal appearance.  HENT:     Head: Normocephalic.     Nose: Nose normal.     Mouth/Throat:     Mouth: Mucous membranes are moist.     Pharynx: Oropharynx is clear.  Eyes:     Pupils: Pupils are equal, round, and reactive to light.  Cardiovascular:     Rate and Rhythm: Normal rate and regular rhythm.     Pulses: Normal pulses.     Heart sounds: Normal heart sounds. No murmur heard. Pulmonary:     Effort: Pulmonary effort is normal.     Breath sounds: Normal breath sounds.     Comments: Mild Wheezing Bilateral Abdominal:     General: Abdomen is flat. Bowel sounds are normal.      Palpations: Abdomen is soft.  Musculoskeletal:        General: Swelling present.     Cervical back: Neck supple.  Skin:    General: Skin is warm.     Comments: Actinic keratosis below right brest  Neurological:     General: No focal deficit present.     Mental Status: She is alert.  Psychiatric:        Mood and Affect: Mood normal.        Thought Content: Thought content normal.    Labs reviewed: Basic Metabolic Panel: Recent Labs    10/20/20 0000 11/24/20 0000 02/14/21 0000  NA 144 144 143  K 3.9 4.0 4.0  CL 106 105 106  CO2 29* 32* 26*  BUN 32* 39* 34*  CREATININE 0.9 1.1 1.2*  CALCIUM 10.0 9.7 9.2  TSH 1.22  --  1.33   Liver Function Tests: Recent Labs    10/20/20 0000 11/24/20 0000 02/14/21 0000  AST 12* 15 14  ALT 7 15 14   ALKPHOS 63 67 73  ALBUMIN 4.0 4.1 4.1   No results for input(s): LIPASE, AMYLASE in the last 8760 hours. No results for input(s): AMMONIA in the last 8760 hours. CBC: Recent Labs    10/20/20 0000 11/24/20 0000 02/14/21 0000  WBC 6.3 7.6 5.7  NEUTROABS 3,837.00 3,952.00  --   HGB 11.0* 12.7 12.8  HCT 35* 39 39  PLT 180 194 219   Lipid Panel: Recent Labs    02/06/21 0000  CHOL 150  HDL 58  LDLCALC 74   No results found for: HGBA1C  Procedures since last visit: No results found.  Assessment/Plan 1. Symptoms of upper respiratory infection (URI) Prednisone 30 mg Taper over 10 days Mucinex For 3 days Continue Robitussin  2. Actinic keratosis Dermatology to see who comes in  the facility  Myoclonic jerking she is stable with Zoloft and Gabapentin Continues to walk with her walker  Mixed incontinence urge and stress  Ditropan 5 mg Chronic diastolic CHF (congestive heart failure) (HCC)  torsemide to 3/week to help with Urinary frequency Paroxysmal atrial fibrillation (HCC) On Coumadin Cognitive impairment On Namenda Off Aricept due to Diarrhea Doing well. Staying in AL Anxiety and depression On  Zoloft Age-related osteoporosis without current pathological fracture On prolia  Hypothyroidism in adult TSH normal in 10/22 HLD on Statin Labs/tests ordered:  * No order type specified * Next appt:  Visit date not found

## 2021-06-23 DIAGNOSIS — R419 Unspecified symptoms and signs involving cognitive functions and awareness: Secondary | ICD-10-CM | POA: Diagnosis not present

## 2021-06-23 DIAGNOSIS — F330 Major depressive disorder, recurrent, mild: Secondary | ICD-10-CM | POA: Diagnosis not present

## 2021-06-23 DIAGNOSIS — F411 Generalized anxiety disorder: Secondary | ICD-10-CM | POA: Diagnosis not present

## 2021-06-29 DIAGNOSIS — Z961 Presence of intraocular lens: Secondary | ICD-10-CM | POA: Diagnosis not present

## 2021-06-29 DIAGNOSIS — H353231 Exudative age-related macular degeneration, bilateral, with active choroidal neovascularization: Secondary | ICD-10-CM | POA: Diagnosis not present

## 2021-07-16 ENCOUNTER — Telehealth: Payer: Self-pay | Admitting: Nurse Practitioner

## 2021-07-16 ENCOUNTER — Other Ambulatory Visit: Payer: Self-pay

## 2021-07-16 ENCOUNTER — Encounter (HOSPITAL_COMMUNITY): Payer: Self-pay

## 2021-07-16 ENCOUNTER — Emergency Department (HOSPITAL_COMMUNITY): Payer: Medicare Other

## 2021-07-16 ENCOUNTER — Inpatient Hospital Stay (HOSPITAL_COMMUNITY)
Admission: EM | Admit: 2021-07-16 | Discharge: 2021-08-12 | DRG: 372 | Disposition: E | Payer: Medicare Other | Attending: Internal Medicine | Admitting: Internal Medicine

## 2021-07-16 DIAGNOSIS — K317 Polyp of stomach and duodenum: Secondary | ICD-10-CM | POA: Diagnosis present

## 2021-07-16 DIAGNOSIS — K449 Diaphragmatic hernia without obstruction or gangrene: Secondary | ICD-10-CM

## 2021-07-16 DIAGNOSIS — Z7901 Long term (current) use of anticoagulants: Secondary | ICD-10-CM | POA: Diagnosis not present

## 2021-07-16 DIAGNOSIS — R8271 Bacteriuria: Secondary | ICD-10-CM

## 2021-07-16 DIAGNOSIS — I480 Paroxysmal atrial fibrillation: Secondary | ICD-10-CM

## 2021-07-16 DIAGNOSIS — I5032 Chronic diastolic (congestive) heart failure: Secondary | ICD-10-CM

## 2021-07-16 DIAGNOSIS — Z66 Do not resuscitate: Secondary | ICD-10-CM | POA: Diagnosis present

## 2021-07-16 DIAGNOSIS — R140 Abdominal distension (gaseous): Secondary | ICD-10-CM | POA: Diagnosis not present

## 2021-07-16 DIAGNOSIS — Z515 Encounter for palliative care: Secondary | ICD-10-CM | POA: Diagnosis not present

## 2021-07-16 DIAGNOSIS — D509 Iron deficiency anemia, unspecified: Secondary | ICD-10-CM | POA: Diagnosis present

## 2021-07-16 DIAGNOSIS — Z7189 Other specified counseling: Secondary | ICD-10-CM | POA: Diagnosis not present

## 2021-07-16 DIAGNOSIS — F0393 Unspecified dementia, unspecified severity, with mood disturbance: Secondary | ICD-10-CM | POA: Diagnosis present

## 2021-07-16 DIAGNOSIS — Z87440 Personal history of urinary (tract) infections: Secondary | ICD-10-CM | POA: Insufficient documentation

## 2021-07-16 DIAGNOSIS — N390 Urinary tract infection, site not specified: Secondary | ICD-10-CM | POA: Diagnosis present

## 2021-07-16 DIAGNOSIS — Z86718 Personal history of other venous thrombosis and embolism: Secondary | ICD-10-CM

## 2021-07-16 DIAGNOSIS — E440 Moderate protein-calorie malnutrition: Secondary | ICD-10-CM | POA: Diagnosis present

## 2021-07-16 DIAGNOSIS — Z9989 Dependence on other enabling machines and devices: Secondary | ICD-10-CM

## 2021-07-16 DIAGNOSIS — M25512 Pain in left shoulder: Secondary | ICD-10-CM

## 2021-07-16 DIAGNOSIS — N1831 Chronic kidney disease, stage 3a: Secondary | ICD-10-CM | POA: Diagnosis present

## 2021-07-16 DIAGNOSIS — R011 Cardiac murmur, unspecified: Secondary | ICD-10-CM | POA: Diagnosis not present

## 2021-07-16 DIAGNOSIS — R1084 Generalized abdominal pain: Secondary | ICD-10-CM | POA: Diagnosis not present

## 2021-07-16 DIAGNOSIS — D649 Anemia, unspecified: Secondary | ICD-10-CM

## 2021-07-16 DIAGNOSIS — D631 Anemia in chronic kidney disease: Secondary | ICD-10-CM | POA: Diagnosis present

## 2021-07-16 DIAGNOSIS — D638 Anemia in other chronic diseases classified elsewhere: Secondary | ICD-10-CM | POA: Diagnosis not present

## 2021-07-16 DIAGNOSIS — F0392 Unspecified dementia, unspecified severity, with psychotic disturbance: Secondary | ICD-10-CM | POA: Diagnosis present

## 2021-07-16 DIAGNOSIS — Z86711 Personal history of pulmonary embolism: Secondary | ICD-10-CM

## 2021-07-16 DIAGNOSIS — K3532 Acute appendicitis with perforation and localized peritonitis, without abscess: Secondary | ICD-10-CM | POA: Diagnosis not present

## 2021-07-16 DIAGNOSIS — Z8249 Family history of ischemic heart disease and other diseases of the circulatory system: Secondary | ICD-10-CM

## 2021-07-16 DIAGNOSIS — I442 Atrioventricular block, complete: Secondary | ICD-10-CM

## 2021-07-16 DIAGNOSIS — E785 Hyperlipidemia, unspecified: Secondary | ICD-10-CM | POA: Diagnosis present

## 2021-07-16 DIAGNOSIS — K5901 Slow transit constipation: Secondary | ICD-10-CM | POA: Diagnosis present

## 2021-07-16 DIAGNOSIS — Z8616 Personal history of COVID-19: Secondary | ICD-10-CM | POA: Insufficient documentation

## 2021-07-16 DIAGNOSIS — I10 Essential (primary) hypertension: Secondary | ICD-10-CM | POA: Diagnosis not present

## 2021-07-16 DIAGNOSIS — R4189 Other symptoms and signs involving cognitive functions and awareness: Secondary | ICD-10-CM | POA: Diagnosis not present

## 2021-07-16 DIAGNOSIS — R109 Unspecified abdominal pain: Secondary | ICD-10-CM | POA: Diagnosis not present

## 2021-07-16 DIAGNOSIS — Z79890 Hormone replacement therapy (postmenopausal): Secondary | ICD-10-CM

## 2021-07-16 DIAGNOSIS — Z823 Family history of stroke: Secondary | ICD-10-CM

## 2021-07-16 DIAGNOSIS — I350 Nonrheumatic aortic (valve) stenosis: Secondary | ICD-10-CM

## 2021-07-16 DIAGNOSIS — Z96652 Presence of left artificial knee joint: Secondary | ICD-10-CM | POA: Diagnosis present

## 2021-07-16 DIAGNOSIS — I130 Hypertensive heart and chronic kidney disease with heart failure and stage 1 through stage 4 chronic kidney disease, or unspecified chronic kidney disease: Secondary | ICD-10-CM | POA: Diagnosis present

## 2021-07-16 DIAGNOSIS — K219 Gastro-esophageal reflux disease without esophagitis: Secondary | ICD-10-CM | POA: Diagnosis present

## 2021-07-16 DIAGNOSIS — I700 Atherosclerosis of aorta: Secondary | ICD-10-CM | POA: Diagnosis not present

## 2021-07-16 DIAGNOSIS — M810 Age-related osteoporosis without current pathological fracture: Secondary | ICD-10-CM | POA: Diagnosis present

## 2021-07-16 DIAGNOSIS — Z20822 Contact with and (suspected) exposure to covid-19: Secondary | ICD-10-CM | POA: Diagnosis present

## 2021-07-16 DIAGNOSIS — I4891 Unspecified atrial fibrillation: Secondary | ICD-10-CM | POA: Diagnosis present

## 2021-07-16 DIAGNOSIS — N3946 Mixed incontinence: Secondary | ICD-10-CM

## 2021-07-16 DIAGNOSIS — F0390 Unspecified dementia without behavioral disturbance: Secondary | ICD-10-CM | POA: Insufficient documentation

## 2021-07-16 DIAGNOSIS — M1990 Unspecified osteoarthritis, unspecified site: Secondary | ICD-10-CM | POA: Diagnosis present

## 2021-07-16 DIAGNOSIS — Z79899 Other long term (current) drug therapy: Secondary | ICD-10-CM

## 2021-07-16 DIAGNOSIS — Z8261 Family history of arthritis: Secondary | ICD-10-CM

## 2021-07-16 DIAGNOSIS — B9620 Unspecified Escherichia coli [E. coli] as the cause of diseases classified elsewhere: Secondary | ICD-10-CM | POA: Diagnosis present

## 2021-07-16 DIAGNOSIS — Z950 Presence of cardiac pacemaker: Secondary | ICD-10-CM

## 2021-07-16 DIAGNOSIS — E039 Hypothyroidism, unspecified: Secondary | ICD-10-CM | POA: Diagnosis present

## 2021-07-16 DIAGNOSIS — K2100 Gastro-esophageal reflux disease with esophagitis, without bleeding: Secondary | ICD-10-CM | POA: Diagnosis not present

## 2021-07-16 DIAGNOSIS — Z800 Family history of malignant neoplasm of digestive organs: Secondary | ICD-10-CM

## 2021-07-16 DIAGNOSIS — K3580 Unspecified acute appendicitis: Secondary | ICD-10-CM | POA: Diagnosis not present

## 2021-07-16 DIAGNOSIS — K353 Acute appendicitis with localized peritonitis, without perforation or gangrene: Secondary | ICD-10-CM | POA: Diagnosis not present

## 2021-07-16 DIAGNOSIS — R1030 Lower abdominal pain, unspecified: Secondary | ICD-10-CM | POA: Diagnosis not present

## 2021-07-16 DIAGNOSIS — R008 Other abnormalities of heart beat: Secondary | ICD-10-CM | POA: Diagnosis not present

## 2021-07-16 DIAGNOSIS — Z86010 Personal history of colonic polyps: Secondary | ICD-10-CM

## 2021-07-16 DIAGNOSIS — Z803 Family history of malignant neoplasm of breast: Secondary | ICD-10-CM

## 2021-07-16 DIAGNOSIS — F339 Major depressive disorder, recurrent, unspecified: Secondary | ICD-10-CM | POA: Diagnosis present

## 2021-07-16 LAB — LIPASE, BLOOD: Lipase: 44 U/L (ref 11–51)

## 2021-07-16 LAB — COMPREHENSIVE METABOLIC PANEL
ALT: 8 U/L (ref 0–44)
AST: 13 U/L — ABNORMAL LOW (ref 15–41)
Albumin: 3.2 g/dL — ABNORMAL LOW (ref 3.5–5.0)
Alkaline Phosphatase: 79 U/L (ref 38–126)
Anion gap: 9 (ref 5–15)
BUN: 24 mg/dL — ABNORMAL HIGH (ref 8–23)
CO2: 27 mmol/L (ref 22–32)
Calcium: 9.1 mg/dL (ref 8.9–10.3)
Chloride: 105 mmol/L (ref 98–111)
Creatinine, Ser: 1.02 mg/dL — ABNORMAL HIGH (ref 0.44–1.00)
GFR, Estimated: 50 mL/min — ABNORMAL LOW (ref >60)
Glucose, Bld: 150 mg/dL — ABNORMAL HIGH (ref 70–99)
Potassium: 3.6 mmol/L (ref 3.5–5.1)
Sodium: 141 mmol/L (ref 135–145)
Total Bilirubin: 0.4 mg/dL (ref 0.3–1.2)
Total Protein: 6 g/dL — ABNORMAL LOW (ref 6.5–8.1)

## 2021-07-16 LAB — URINALYSIS, ROUTINE W REFLEX MICROSCOPIC
Bilirubin Urine: NEGATIVE
Glucose, UA: NEGATIVE mg/dL
Hgb urine dipstick: NEGATIVE
Ketones, ur: 5 mg/dL — AB
Nitrite: NEGATIVE
Protein, ur: NEGATIVE mg/dL
Specific Gravity, Urine: 1.023 (ref 1.005–1.030)
WBC, UA: >50 WBC/hpf — ABNORMAL HIGH (ref 0–5)
pH: 5 (ref 5.0–8.0)

## 2021-07-16 LAB — IRON AND TIBC
Iron: 16 ug/dL — ABNORMAL LOW (ref 28–170)
Saturation Ratios: 6 % — ABNORMAL LOW (ref 10.4–31.8)
TIBC: 290 ug/dL (ref 250–450)
UIBC: 274 ug/dL

## 2021-07-16 LAB — CBC WITH DIFFERENTIAL/PLATELET
Abs Immature Granulocytes: 0.11 10*3/uL — ABNORMAL HIGH (ref 0.00–0.07)
Basophils Absolute: 0.1 10*3/uL (ref 0.0–0.1)
Basophils Relative: 1 %
Eosinophils Absolute: 0.1 10*3/uL (ref 0.0–0.5)
Eosinophils Relative: 1 %
HCT: 33.2 % — ABNORMAL LOW (ref 36.0–46.0)
Hemoglobin: 10.2 g/dL — ABNORMAL LOW (ref 12.0–15.0)
Immature Granulocytes: 1 %
Lymphocytes Relative: 14 %
Lymphs Abs: 1.4 10*3/uL (ref 0.7–4.0)
MCH: 30.2 pg (ref 26.0–34.0)
MCHC: 30.7 g/dL (ref 30.0–36.0)
MCV: 98.2 fL (ref 80.0–100.0)
Monocytes Absolute: 0.8 10*3/uL (ref 0.1–1.0)
Monocytes Relative: 8 %
Neutro Abs: 7.5 10*3/uL (ref 1.7–7.7)
Neutrophils Relative %: 75 %
Platelets: 160 10*3/uL (ref 150–400)
RBC: 3.38 MIL/uL — ABNORMAL LOW (ref 3.87–5.11)
RDW: 15.8 % — ABNORMAL HIGH (ref 11.5–15.5)
WBC: 9.9 10*3/uL (ref 4.0–10.5)
nRBC: 0 % (ref 0.0–0.2)

## 2021-07-16 LAB — RETICULOCYTES
Immature Retic Fract: 23.6 % — ABNORMAL HIGH (ref 2.3–15.9)
RBC.: 3.35 MIL/uL — ABNORMAL LOW (ref 3.87–5.11)
Retic Count, Absolute: 65.3 10*3/uL (ref 19.0–186.0)
Retic Ct Pct: 2 % (ref 0.4–3.1)

## 2021-07-16 LAB — PROTIME-INR
INR: 2.5 — ABNORMAL HIGH (ref 0.8–1.2)
Prothrombin Time: 26.8 seconds — ABNORMAL HIGH (ref 11.4–15.2)

## 2021-07-16 LAB — RESP PANEL BY RT-PCR (FLU A&B, COVID) ARPGX2
Influenza A by PCR: NEGATIVE
Influenza B by PCR: NEGATIVE
SARS Coronavirus 2 by RT PCR: NEGATIVE

## 2021-07-16 LAB — TROPONIN I (HIGH SENSITIVITY)
Troponin I (High Sensitivity): 13 ng/L (ref <18)
Troponin I (High Sensitivity): 15 ng/L (ref <18)

## 2021-07-16 LAB — VITAMIN B12: Vitamin B-12: 201 pg/mL (ref 180–914)

## 2021-07-16 LAB — FOLATE: Folate: 12.4 ng/mL (ref >5.9)

## 2021-07-16 MED ORDER — PHENOL 1.4 % MT LIQD
2.0000 | OROMUCOSAL | Status: DC | PRN
  Filled 2021-07-16: qty 177

## 2021-07-16 MED ORDER — FENTANYL CITRATE PF 50 MCG/ML IJ SOSY
25.0000 ug | PREFILLED_SYRINGE | INTRAMUSCULAR | Status: DC | PRN
  Administered 2021-07-16 – 2021-07-17 (×3): 50 ug via INTRAVENOUS
  Administered 2021-07-18: 25 ug via INTRAVENOUS
  Administered 2021-07-19: 50 ug via INTRAVENOUS
  Administered 2021-07-19: 25 ug via INTRAVENOUS
  Filled 2021-07-16 (×7): qty 1

## 2021-07-16 MED ORDER — BISACODYL 10 MG RE SUPP
10.0000 mg | Freq: Two times a day (BID) | RECTAL | Status: DC | PRN
Start: 2021-07-16 — End: 2021-07-24

## 2021-07-16 MED ORDER — METHOCARBAMOL 1000 MG/10ML IJ SOLN
1000.0000 mg | Freq: Four times a day (QID) | INTRAVENOUS | Status: DC | PRN
  Administered 2021-07-18: 1000 mg via INTRAVENOUS
  Filled 2021-07-16: qty 1000
  Filled 2021-07-16: qty 10

## 2021-07-16 MED ORDER — METRONIDAZOLE 500 MG/100ML IV SOLN
500.0000 mg | Freq: Once | INTRAVENOUS | Status: DC
  Filled 2021-07-16: qty 100

## 2021-07-16 MED ORDER — SODIUM CHLORIDE 0.9 % IV SOLN
8.0000 mg | Freq: Four times a day (QID) | INTRAVENOUS | Status: DC | PRN
  Filled 2021-07-16: qty 4

## 2021-07-16 MED ORDER — ONDANSETRON HCL 4 MG/2ML IJ SOLN: 4.0000 mg | Freq: Four times a day (QID) | INTRAMUSCULAR | Status: DC | PRN

## 2021-07-16 MED ORDER — ENALAPRILAT 1.25 MG/ML IV SOLN
0.6250 mg | Freq: Four times a day (QID) | INTRAVENOUS | Status: DC | PRN
  Filled 2021-07-16: qty 1

## 2021-07-16 MED ORDER — PIPERACILLIN-TAZOBACTAM 3.375 G IVPB
3.3750 g | Freq: Three times a day (TID) | INTRAVENOUS | Status: DC
  Administered 2021-07-17 – 2021-07-19 (×8): 3.375 g via INTRAVENOUS
  Filled 2021-07-16 (×8): qty 50

## 2021-07-16 MED ORDER — IOHEXOL 300 MG/ML  SOLN
100.0000 mL | Freq: Once | INTRAMUSCULAR | Status: AC | PRN
  Administered 2021-07-16: 100 mL via INTRAVENOUS

## 2021-07-16 MED ORDER — MAGIC MOUTHWASH
15.0000 mL | Freq: Four times a day (QID) | ORAL | Status: DC | PRN
  Filled 2021-07-16: qty 15

## 2021-07-16 MED ORDER — MORPHINE SULFATE (PF) 2 MG/ML IV SOLN
2.0000 mg | Freq: Once | INTRAVENOUS | Status: DC
  Filled 2021-07-16: qty 1

## 2021-07-16 MED ORDER — MENTHOL 3 MG MT LOZG
1.0000 | LOZENGE | OROMUCOSAL | Status: DC | PRN
Start: 2021-07-16 — End: 2021-07-24
  Filled 2021-07-16: qty 9

## 2021-07-16 MED ORDER — PIPERACILLIN-TAZOBACTAM 3.375 G IVPB 30 MIN
3.3750 g | Freq: Once | INTRAVENOUS | Status: AC
  Administered 2021-07-16: 3.375 g via INTRAVENOUS
  Filled 2021-07-16: qty 50

## 2021-07-16 MED ORDER — LACTATED RINGERS IV BOLUS
1000.0000 mL | Freq: Once | INTRAVENOUS | Status: AC
  Administered 2021-07-16: 1000 mL via INTRAVENOUS

## 2021-07-16 MED ORDER — SIMETHICONE 40 MG/0.6ML PO SUSP
80.0000 mg | Freq: Four times a day (QID) | ORAL | Status: DC | PRN
  Administered 2021-07-19 – 2021-07-20 (×2): 80 mg via ORAL
  Filled 2021-07-16 (×7): qty 1.2

## 2021-07-16 MED ORDER — PANTOPRAZOLE SODIUM 40 MG IV SOLR
40.0000 mg | INTRAVENOUS | Status: DC
  Administered 2021-07-16 – 2021-07-18 (×3): 40 mg via INTRAVENOUS
  Filled 2021-07-16 (×3): qty 10

## 2021-07-16 MED ORDER — PROCHLORPERAZINE EDISYLATE 10 MG/2ML IJ SOLN: 5.0000 mg | INTRAMUSCULAR | Status: DC | PRN

## 2021-07-16 MED ORDER — METOPROLOL TARTRATE 5 MG/5ML IV SOLN: 5.0000 mg | Freq: Four times a day (QID) | INTRAVENOUS | Status: DC | PRN

## 2021-07-16 MED ORDER — CEFTRIAXONE SODIUM 1 G IJ SOLR
1.0000 g | Freq: Once | INTRAMUSCULAR | Status: DC
Start: 2021-07-16 — End: 2021-07-16
  Filled 2021-07-16: qty 10

## 2021-07-16 MED ORDER — LIP MEDEX EX OINT
1.0000 | TOPICAL_OINTMENT | Freq: Two times a day (BID) | CUTANEOUS | Status: DC
Start: 2021-07-16 — End: 2021-07-24
  Administered 2021-07-17 – 2021-07-24 (×14): 1 via TOPICAL
  Filled 2021-07-16 (×3): qty 7

## 2021-07-16 MED ORDER — SODIUM CHLORIDE 0.9 % IV SOLN
1.0000 g | Freq: Once | INTRAVENOUS | Status: DC
  Administered 2021-07-16: 1 g via INTRAVENOUS
  Filled 2021-07-16: qty 10

## 2021-07-16 MED ORDER — ACETAMINOPHEN 650 MG RE SUPP: 650.0000 mg | Freq: Four times a day (QID) | RECTAL | Status: DC | PRN

## 2021-07-16 MED ORDER — ALUM & MAG HYDROXIDE-SIMETH 200-200-20 MG/5ML PO SUSP
30.0000 mL | Freq: Four times a day (QID) | ORAL | Status: DC | PRN
  Administered 2021-07-18: 30 mL via ORAL
  Filled 2021-07-16: qty 30

## 2021-07-16 MED ORDER — ALBUMIN HUMAN 5 % IV SOLN
12.5000 g | Freq: Four times a day (QID) | INTRAVENOUS | Status: DC | PRN
Start: 2021-07-16 — End: 2021-07-19
  Filled 2021-07-16: qty 250

## 2021-07-16 MED ORDER — ACETAMINOPHEN 325 MG PO TABS
325.0000 mg | ORAL_TABLET | Freq: Four times a day (QID) | ORAL | Status: DC | PRN
  Administered 2021-07-16 – 2021-07-18 (×5): 650 mg via ORAL
  Filled 2021-07-16 (×5): qty 2

## 2021-07-16 NOTE — Assessment & Plan Note (Addendum)
Rate controlled without medication.  ?Patient was on warfarin. ?Now comfort care. ?

## 2021-07-16 NOTE — Assessment & Plan Note (Addendum)
Now comfort care 

## 2021-07-16 NOTE — Telephone Encounter (Signed)
The facility nurse called last night and reported the patient c/o LUQ pain, 8/10, had BM earlier. Suggested ED eval or obtain CXR, X-ray abd, CMP, CBC, UA C/S. HPOA and patient decided ED evaluation in morning.  ?

## 2021-07-16 NOTE — Assessment & Plan Note (Addendum)
Renal function remained stable while the hospital stay.  Now comfort care. ?

## 2021-07-16 NOTE — Assessment & Plan Note (Deleted)
IV PPI while n.p.o. ?

## 2021-07-16 NOTE — Assessment & Plan Note (Deleted)
Oriented x4 except date of months.  Good sense of humor.  She has hard of hearing ?-Reorientation and delirium precaution. ?-Resume home meds once she started taking p.o. ?

## 2021-07-16 NOTE — Plan of Care (Signed)
  Problem: Activity: Goal: Risk for activity intolerance will decrease Outcome: Progressing   Problem: Pain Managment: Goal: General experience of comfort will improve Outcome: Progressing   

## 2021-07-16 NOTE — Assessment & Plan Note (Addendum)
Resolved

## 2021-07-16 NOTE — Assessment & Plan Note (Addendum)
Remote but not clear if it is provoked or not ?Now comfort care. ?

## 2021-07-16 NOTE — Assessment & Plan Note (Deleted)
Not a surgical candidate. ?

## 2021-07-16 NOTE — Assessment & Plan Note (Addendum)
Presents with abdominal pain for a week. Worse the night before presentation.  Exam was RLQ tenderness.   ?CT A/P concerning for acute appendicitis with contained microperforation. ?General surgery consulted -high risk for surgery. ?Family decided to transition to complete comfort. ?

## 2021-07-16 NOTE — Assessment & Plan Note (Addendum)
Severe aortic stenosis. ?Moderate MS. ?Permanent pacemaker implant. ?Not a surgical candidate for aortic stenosis.  Currently comfort care. ?

## 2021-07-16 NOTE — Assessment & Plan Note (Addendum)
Was on Synthroid.  Now comfort care. ?

## 2021-07-16 NOTE — Consult Note (Addendum)
[name]  Jul 25, 1922 [id number]  Patient Care Team: [name], MD as PCP - General (Internal Medicine) Jettie Booze, MD as PCP - Cardiology (Cardiology) Thompson Grayer, MD as PCP - Electrophysiology (Cardiology) Blanch Media, MD as Consulting Physician (Neurology) Thompson Grayer, MD as Consulting Physician (Cardiology) [name], MD as Consulting Physician (Gastroenterology)  This patient is a 88 y.o.female that there was a long Emergency Department.  Consultation requested by [name].  Daleen Bo.  Elderly woman lives at [facility] that walks with a walker.  Limited activity.  DNR/DNI.  Multiple medical problems including congestive heart failure, pacemaker, history of DVTs and pulmonary emboli on chronic warfarin anticoagulation, chronic kidney disease.  She also has some dementia and cognitive impairment.  Apparently had intermittent abdominal pain for the past week that became more intense yesterday.  Reached out to primary care office.  They recommended laboratory and radiology evaluations and possible ER visit.  They decided to hold off through the weekend.  Patient felt more worse.  Brought in by EMS.  Complaining of lower abdominal pain.  Examination and CT scan concerning for appendicitis.  Looking at the CAT scan there is phlegmon and inflammation and pockets of extraperitoneal gas strongly suspicious for perforated appendicitis.   That seems more consistent with a chronic issue for the past week.  While standard of care is to consider appendectomy, this is a complicated attack in a very elderly woman who is fully anticoagulated with multiple medical problems.  There is no need for emergency surgery tonight since she is not in shock, without any severe fevers or leukocytosis or severe peritonitis.  Start with resuscitation, IV antibiotics, nausea and pain control, hold anticoagulation, bowel rest, etc.  Medical admission and  stabilization with surgical consultation.   Have a chance to objectively assess performance status and surgical risks.  Question of need to reevaluate cardiac clearance or not.  Can consider palliative care evaluation for goals of care.  N.p.o. with ice chips and bowel rest for now.  NG tube if much worse.  She does not seem to have massive stomach or small bowel dilatation so try and hold off.  Her albumin is decreased at 3.2.  Check prealbumin to assess baseline nutritional status.  IV antibiotics.  I would do IV Zosyn given the complicated appendicitis.  Low threshold to do repeat CAT scan to rule delayed abscess in 4-5 days, especially in light of her fair ability to be a reliable historian or offer reliable examination.[name]  Possible diagnostic laparoscopy and appendectomy if INR corrected and felt to be a reasonable operative risk.  Would have to defer to oncoming surgeon, [name], tomorrow to see what his final input and recommendations since he will be running the inpatient surgical hospital service for the oncoming week  History of recurrent UTIs with dirty UA.  Agree with urine culture.  Has grown up partially resistant Klebsiella.  Resistant ampicillin but sensitive to piperacillin/Tazobactam.  Therefore Zosyn should be appropriate for now.  She is fully anticoagulated.  Would hold her anticoagulation and allow her INR to normalize.  Most likely can proceed with surgery or drainage of delayed abscess if INR less than 1.7.  She does have anemia and is fully anticoagulated.  Followed by [facility].  Dr. [name] removed a gastric polyp in the stomach that was hyperplastic within the past couple of years.  Has documented small sliding hiatal hernia on prior endoscopies but last 1 did  not show any major [place] ulcerations.  I do not think she has had a colonoscopy in over a decade.  Check iron studies.  Looks like they have only checked ferritins for a while  She is DNR and DNI.   Need to get an assessment from her patient's healthcare power of attorney of how aggressive they wish to be.  Patient did declare that she would be willing to consider surgery if needed.  Full consult to follow    Patient Active Problem List   Diagnosis Date Noted   Cognitive impairment 09/02/2017    Priority: High   Chronic kidney disease, stage 3a ([place]) 08/04/2021   Acute perforated appendicitis 07/14/2021   History of COVID-19 07/20/2021   Uses roller walker 08/04/2021   Anticoagulated on warfarin 07/18/2021   Dementia ([place]) 07/28/2021   Arthritis 08/05/2021   Hypertension 07/15/2021   Symptoms of upper respiratory infection (URI) 11/21/2020   Slow transit constipation 08/01/2020   Depression, recurrent ([place]) 07/05/2020   Pneumonia due to COVID-19 virus 06/02/2020   Chronic diastolic CHF (congestive heart failure) ([place]) 06/02/2020   Acute respiratory failure with hypoxia ([place]) 06/02/2020   Age related osteoporosis 04/05/2020   GERD (gastroesophageal reflux disease) 04/05/2020   Mixed incontinence urge and stress 01/28/2019   Acute gingivitis, plaque induced 10/15/2018   Hyperlipidemia 10/15/2018   Diarrhea in adult patient 03/05/2018   History of DVT (deep vein thrombosis) 09/02/2017   Involuntary movements 09/02/2017   Hypothyroidism 09/02/2017   Myoclonic jerking 07/12/2016   Encounter for therapeutic drug monitoring 11/23/2013   Pacemaker-Medtronic 03/11/2012   Edema of both legs 09/28/2011   Long term (current) use of anticoagulants 05/23/2011   Atypical chest pain 02/12/2011   Fatigue 02/12/2011   Rhinitis, chronic 01/17/2011   Bronchitis, chronic ([place]) 12/15/2010   Back pain 11/06/2010   Hypotension 11/06/2010   PAROXYSMAL ATRIAL FIBRILLATION 07/26/2010   ATRIOVENTRICULAR BLOCK, 3RD DEGREE 04/12/2010   DYSPHAGIA 12/14/2008    Past Medical History:  Diagnosis Date   Arthritis    Atrial fibrillation (HCC)    Cataract    removed bilateral   Chronic  diastolic CHF (congestive heart failure) ([place]) 06/02/2020   Clotting disorder ([place])    h/o blood clot left leg   Dementia ([place])    Depression    DVT (deep venous thrombosis) ([place]) 2011   GERD (gastroesophageal reflux disease)    HB (heart block)    s/p PPM, most recent generator change 12/11 by JA   Heart murmur    Hiatal hernia    Hyperlipidemia    Hypertension    Hypothyroidism    Osteoporosis    PTE (post-transplant erythrocytosis)    Pulmonary embolism ([place]) 2011   on coumadin   UTI (lower urinary tract infection)     Past Surgical History:  Procedure Laterality Date   BLADDER SURGERY     twice   cataracts     bilateral   CHOLECYSTECTOMY  09/2010   COLONOSCOPY     ESOPHAGOGASTRODUODENOSCOPY     HEMORRHOID SURGERY     hysterectomy (other)     INNER EAR SURGERY     JOINT REPLACEMENT     pacemaker     most recent generator 12/11 by [place] ARTHROPLASTY Left     Social History   Socioeconomic History   Marital status: Married    Spouse name: Not on file   Number of children: 2   Years of education: Not on file  Highest education level: Not on file  Occupational History   Occupation: retired bookkeeper  Tobacco Use   Smoking status: Never   Smokeless tobacco: Never  Vaping Use   Vaping Use: Never used  Substance and Sexual Activity   Alcohol use: No   Drug use: No   Sexual activity: Not Currently  Other Topics Concern   Not on file  Social History Narrative   Tobacco use, amount per day now: 0      Past tobacco use, amount per day: 0      How many years did you use tobacco: 0      Alcohol use (drinks per week): 0      Diet:      Do you drink/eat things with caffeine? Yes      Marital status: Married            What year were you married? 1946      Do you live in a house, apartment, assisted living, condo, trailer? Apartment       Is it one or more stories? 1      How many persons live in your home? 2      Do you have any pets in  your home? 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 Yes            How often? 3-4 times a week       Do you have a living will? Yes      Do you have a DNR form?            If not, do you want to discuss one?      Do you have signed POA/HPOA forms? Yes             Social Determinants of Health   Financial Resource Strain: Not on file  Food Insecurity: Not on file  Transportation Needs: Not on file  Physical Activity: Not on file  Stress: Not on file  Social Connections: Not on file  Intimate Partner Violence: Not on file    Family History  Problem Relation Age of Onset   Stroke Father    Heart disease Father    Arthritis Father    Heart disease Mother    Arthritis Mother    Stomach cancer Brother    Stomach cancer Brother    Liver cancer Brother    Breast cancer Sister    Colon cancer Neg Hx    Colon polyps Neg Hx    Esophageal cancer Neg Hx    Rectal cancer Neg Hx     Current Facility-Administered Medications  Medication Dose Route Frequency Provider Last Rate Last Admin   acetaminophen (TYLENOL) suppository 650 mg  650 mg Rectal Q6H PRN Michael Boston, MD       acetaminophen (TYLENOL) tablet 325-650 mg  325-650 mg Oral Q6H PRN Michael Boston, MD       albumin human 5 % solution 12.5 g  12.5 g Intravenous Q6H PRN Michael Boston, MD       alum & mag hydroxide-simeth (MAALOX/MYLANTA) 200-200-20 MG/5ML suspension 30 mL  30 mL Oral Q6H PRN Michael Boston, MD       bisacodyl (DULCOLAX) suppository 10 mg  10 mg Rectal Q12H PRN Michael Boston, MD       enalaprilat (VASOTEC) injection 0.625-1.25 mg  0.625-1.25 mg Intravenous Q6H PRN Michael Boston, MD  fentaNYL (SUBLIMAZE) injection 25-50 mcg  25-50 mcg Intravenous Q1H PRN Michael Boston, MD       lactated ringers bolus 1,000 mL  1,000 mL Intravenous Once Michael Boston, MD       lip balm (CARMEX) ointment 1 application  1 application Topical BID Michael Boston, MD       magic mouthwash  15 mL Oral  QID PRN Michael Boston, MD       menthol-cetylpyridinium (CEPACOL) lozenge 3 mg  1 lozenge Oral PRN Michael Boston, MD       methocarbamol (ROBAXIN) 1,000 mg in dextrose 5 % 100 mL IVPB  1,000 mg Intravenous Q6H PRN Michael Boston, MD       metoprolol tartrate (LOPRESSOR) injection 5 mg  5 mg Intravenous Q6H PRN Michael Boston, MD       ondansetron [facility]) injection 4 mg  4 mg Intravenous Q6H PRN Michael Boston, MD       Or   ondansetron (ZOFRAN) 8 mg in sodium chloride 0.9 % 50 mL IVPB  8 mg Intravenous Q6H PRN Michael Boston, MD       phenol (CHLORASEPTIC) mouth spray 2 spray  2 spray Mouth/Throat PRN Michael Boston, MD       piperacillin-tazobactam (ZOSYN) IVPB 3.375 g  3.375 g Intravenous Tor Netters, MD       prochlorperazine (COMPAZINE) injection 5-10 mg  5-10 mg Intravenous Q4H PRN Michael Boston, MD       simethicone (MYLICON) 40 VX/7.9TJ suspension 80 mg  80 mg Oral QID PRN Michael Boston, MD       Current Outpatient Medications  Medication Sig Dispense Refill   acetaminophen (TYLENOL) 325 MG tablet Take 650 mg by mouth 3 (three) times daily.     ammonium lactate (LAC-HYDRIN) 12 % lotion Apply 1 application topically as needed for dry skin.     Calcium Carb-Cholecalciferol 600-800 MG-UNIT TABS Take 1 tablet by mouth daily.     cholecalciferol (VITAMIN D) 1000 UNITS tablet Take 1,000 Units by mouth daily.      denosumab (PROLIA) 60 MG/ML SOSY injection Inject 60 mg into the skin. Once A Day on the 1st of Every 6th Month     gabapentin (NEURONTIN) 100 MG capsule Take 2 capsules (200 mg total) by mouth daily with lunch. 90 capsule 3   gabapentin (NEURONTIN) 300 MG capsule TAKE 1 CAPSULE TWICE DAILY 180 capsule 1   levothyroxine (SYNTHROID) 50 MCG tablet TAKE 1 TABLET (50 MCG TOTAL) BY MOUTH DAILY BEFORE BREAKFAST. 90 tablet 1   loratadine (CLARITIN) 10 MG tablet Take 10 mg by mouth daily.     meclizine (ANTIVERT) 25 MG tablet Take 25 mg by mouth 3 (three) times daily as needed for  dizziness.     memantine (NAMENDA) 10 MG tablet Take 1 tablet (10 mg total) by mouth 2 (two) times daily. 180 tablet 1   Multiple Vitamins-Minerals (PRESERVISION AREDS 2) CAPS Take 1 capsule by mouth 2 (two) times daily.     omeprazole (PRILOSEC) 20 MG capsule TAKE 1 CAPSULE TWICE DAILY 180 capsule 1   oxybutynin (DITROPAN-XL) 5 MG 24 hr tablet Take 5 mg by mouth daily.     polyethylene glycol (MIRALAX / GLYCOLAX) 17 g packet Take 17 g by mouth daily.     potassium chloride SA (KLOR-CON) 20 MEQ tablet Take 20 mEq by mouth. Once A Day on Mon, Wed, Fri     pravastatin (PRAVACHOL) 20 MG tablet TAKE 1 TABLET EVERY DAY 90 tablet  1   sertraline (ZOLOFT) 50 MG tablet Take 50 mg by mouth as directed. 1 tablet by mouth in the am and 1/2 tablet by mouth in the pm     torsemide (DEMADEX) 20 MG tablet Take 20 mg by mouth 3 (three) times a week.     warfarin (COUMADIN) 5 MG tablet Take 5 mg by mouth as directed. On Sunday, Tuesday, Wednesday, Thursday, and Saturday at 6pm. See other listing     warfarin (COUMADIN) 6 MG tablet Take 6 mg by mouth daily. Monday and Friday see other listing       Allergies  Allergen Reactions   Hydrocodone Other (See Comments)    Dizziness & jerking   Alendronate Other (See Comments)    Can not tolerate   Celebrex [Celecoxib] Other (See Comments)    Can not tolerate   Meloxicam Other (See Comments)    Avoid    Myrbetriq [Mirabegron] Other (See Comments)    Can not tolerate   Pradaxa [Dabigatran Etexilate Mesylate] Other (See Comments)    Can not tolerate   Toviaz [Fesoterodine Fumarate Er] Other (See Comments)    Can not tolerate   Vesicare [Solifenacin] Other (See Comments)    Can not tolerate    BP 111/62    Pulse 97    Temp 97.8 F (36.6 C) (Oral)    Resp 19    Ht 5' (1.524 m)    Wt 59.4 kg    SpO2 92%    BMI 25.58 kg/m   CT ABDOMEN PELVIS W CONTRAST  Result Date: 07/22/2021 CLINICAL DATA:  Lower abdominal pain. EXAM: CT ABDOMEN AND PELVIS WITH CONTRAST  TECHNIQUE: Multidetector CT imaging of the abdomen and pelvis was performed using the standard protocol following bolus administration of intravenous contrast. RADIATION DOSE REDUCTION: This exam was performed according to the departmental dose-optimization program which includes automated exposure control, adjustment of the mA and/or kV according to patient size and/or use of iterative reconstruction technique. CONTRAST:  142m OMNIPAQUE IOHEXOL 300 MG/ML  SOLN COMPARISON:  09/29/2018 FINDINGS: Lower chest: No acute abnormality. Cardiomegaly. Aortic valve calcifications. Coronary artery calcifications. Scarring and or fibrosis of the included bilateral lung bases. Hepatobiliary: No focal liver abnormality is seen. Status post cholecystectomy. No biliary dilatation. Pancreas: Unremarkable. No pancreatic ductal dilatation or surrounding inflammatory changes. Spleen: Normal in size without significant abnormality. Adrenals/Urinary Tract: Adrenal glands are unremarkable. Kidneys are normal, without renal calculi, solid lesion, or hydronephrosis. Bladder is unremarkable. Stomach/Bowel: Stomach is within normal limits. The appendix is fluid-filled, slightly hyperenhancing, and nondilated, however there is extensive adjacent fat stranding and small locules of extraluminal air (series 2, image 51). No evidence of bowel wall thickening, distention, or inflammatory changes. Vascular/Lymphatic: Aortic atherosclerosis. No enlarged abdominal or pelvic lymph nodes. Reproductive: Status post hysterectomy. Other: No abdominal wall hernia or abnormality. No ascites. Musculoskeletal: No acute or significant osseous findings. IMPRESSION: 1. The appendix is fluid-filled, slightly hyperenhancing, and nondilated, however there is extensive adjacent fat stranding and small locules of extraluminal air. Findings are consistent with acute appendicitis complicated by micro perforation. 2. Cardiomegaly and coronary artery disease. These  results were called by telephone at the time of interpretation on 07/18/2021 at 5:01 pm to [place], who verbally acknowledged these results. Aortic Atherosclerosis (ICD10-I70.0). Electronically Signed   By: ADelanna AhmadiM.D.   On: 08/08/2021 17:04    Note:  This dictation was prepared with Dragon/digital dictation along with [employer] Any transcriptional errors that result from  this process are unintentional.   .Adin Hector, M.D., F.A.C.S. Gastrointestinal and Minimally Invasive Surgery [facility] [address], [name], [place] [address] [phone] Main / Paging  07/18/2021 5:38 PM

## 2021-07-16 NOTE — ED Triage Notes (Signed)
Pt coming form [facility] via EMS with c/o bilateral lower quadrant pain that has been off and on for a week. Pt does not report constipation and reports a normal bowel movement today. Pt is A&Ox4.  ?

## 2021-07-16 NOTE — Assessment & Plan Note (Deleted)
EKG with V paced rhythm. ?

## 2021-07-16 NOTE — ED Provider Notes (Signed)
[place] DEPT Provider Note   CSN: [id number] Arrival date & time: 07/14/2021  1516     History  No chief complaint on file.   [name] is a 88 y.o. female.  The history is provided by the patient, the EMS personnel and medical records. No language interpreter was used.   88 year old female with significant history of coagulopathy currently on warfarin, atrial fibrillation, CHF, hypertension, dementia, thyroid disease, has pacemaker, brought here via EMS from [facility] for evaluation of abdominal pain.  Due to history of dementia, history is limited.  Patient endorses having abdominal pain for "quite a while" she mention pain is waxing and waning, currently moderate in severity.  No report of any fever chills no runny nose sneezing or coughing no chest pain or shortness of breath no nausea vomiting diarrhea or dysuria.  She endorsed normal appetite.  Last bowel movement was normal and was today.  No diarrhea constipation.  EMS noted noted that patient has had this pain which is on and off for the past week.  She did mention taking Tylenol helps with her pain.  Patient has a MOST form available.  Home Medications Prior to Admission medications   Medication Sig Start Date End Date Taking? Authorizing Provider  acetaminophen (TYLENOL) 325 MG tablet Take 650 mg by mouth 3 (three) times daily.    [provider]  ammonium lactate (LAC-HYDRIN) 12 % lotion Apply 1 application topically as needed for dry skin.    [provider]  Calcium Carb-Cholecalciferol 600-800 MG-UNIT TABS Take 1 tablet by mouth daily.    [provider]  cholecalciferol (VITAMIN D) 1000 UNITS tablet Take 1,000 Units by mouth daily.     [provider]  denosumab (PROLIA) 60 MG/ML SOSY injection Inject 60 mg into the skin. Once A Day on the 1st of Every 6th Month    [provider]  gabapentin (NEURONTIN) 100 MG capsule Take 2  capsules (200 mg total) by mouth daily with lunch. 09/19/20   Fargo, Amy E, NP  gabapentin (NEURONTIN) 300 MG capsule TAKE 1 CAPSULE TWICE DAILY 02/08/20   [name], MD  levothyroxine (SYNTHROID) 50 MCG tablet TAKE 1 TABLET (50 MCG TOTAL) BY MOUTH DAILY BEFORE BREAKFAST. 02/17/20   [name], MD  loratadine (CLARITIN) 10 MG tablet Take 10 mg by mouth daily.    [provider]  meclizine (ANTIVERT) 25 MG tablet Take 25 mg by mouth 3 (three) times daily as needed for dizziness.    [provider]  memantine (NAMENDA) 10 MG tablet Take 1 tablet (10 mg total) by mouth 2 (two) times daily. 07/05/20   [name], MD  Multiple Vitamins-Minerals (PRESERVISION AREDS 2) CAPS Take 1 capsule by mouth 2 (two) times daily.    [provider]  omeprazole (PRILOSEC) 20 MG capsule TAKE 1 CAPSULE TWICE DAILY 01/06/20   [name], MD  oxybutynin (DITROPAN-XL) 5 MG 24 hr tablet Take 5 mg by mouth daily.    [provider]  polyethylene glycol (MIRALAX / GLYCOLAX) 17 g packet Take 17 g by mouth daily.    [provider]  potassium chloride SA (KLOR-CON) 20 MEQ tablet Take 20 mEq by mouth. Once A Day on Mon, Wed, Fri    [provider]  pravastatin (PRAVACHOL) 20 MG tablet TAKE 1 TABLET EVERY DAY 05/23/20   [name], MD  sertraline (ZOLOFT) 50 MG tablet Take 50 mg by  mouth as directed. 1 tablet by mouth in the am and 1/2 tablet by mouth in the pm    [provider]  torsemide (DEMADEX) 20 MG tablet Take 20 mg by mouth 3 (three) times a week.    [provider]  warfarin (COUMADIN) 5 MG tablet Take 5 mg by mouth as directed. On Sunday, Tuesday, Wednesday, Thursday, and Saturday at 6pm. See other listing    [provider]  warfarin (COUMADIN) 6 MG tablet Take 6 mg by mouth daily. Monday and Friday see other listing    [provider]      Allergies    Alendronate, Celebrex [celecoxib], Meloxicam, Myrbetriq  [mirabegron], Norco [hydrocodone-acetaminophen], Pradaxa [dabigatran etexilate mesylate], Pradaxa [dabigatran], Toviaz [fesoterodine fumarate er], and Vesicare [solifenacin]    Review of Systems   Review of Systems  All other systems reviewed and are negative.  Physical Exam Updated Vital Signs BP (!) 126/59 (BP Location: Left Arm)    Pulse 94    Temp 97.8 F (36.6 C) (Oral)    Resp 17    Ht 5' (1.524 m)    Wt 59.4 kg    SpO2 95%    BMI 25.58 kg/m  Physical Exam Vitals and nursing note reviewed.  Constitutional:      General: She is not in acute distress.    Appearance: She is well-developed.     Comments: Elderly female laying in bed appears to be in no acute discomfort.  HENT:     Head: Atraumatic.  Eyes:     Conjunctiva/sclera: Conjunctivae normal.  Cardiovascular:     Rate and Rhythm: Normal rate and regular rhythm.     Heart sounds: Murmur heard.  Pulmonary:     Effort: Pulmonary effort is normal.     Breath sounds: Rales (Faint crackles heard at right lower lung base) present.  Abdominal:     General: There is distension.     Tenderness: There is abdominal tenderness.     Comments: Abdomen is mildly distended, tenderness along lower abdomen on palpation  Musculoskeletal:     Cervical back: Neck supple.     Right lower leg: Edema present.     Left lower leg: Edema present.  Skin:    Findings: No rash.  Neurological:     Mental Status: She is alert. Mental status is at baseline.  Psychiatric:        Mood and Affect: Mood normal.    ED Results / Procedures / Treatments   Labs (all labs ordered are listed, but only abnormal results are displayed) Labs Reviewed  CBC WITH DIFFERENTIAL/PLATELET - Abnormal; Notable for the following components:      Result Value   RBC 3.38 (*)    Hemoglobin 10.2 (*)    HCT 33.2 (*)    RDW 15.8 (*)    Abs Immature Granulocytes 0.11 (*)    All other components within normal limits  COMPREHENSIVE METABOLIC PANEL - Abnormal; Notable  for the following components:   Glucose, Bld 150 (*)    BUN 24 (*)    Creatinine, Ser 1.02 (*)    Total Protein 6.0 (*)    Albumin 3.2 (*)    AST 13 (*)    GFR, Estimated 50 (*)    All other components within normal limits  URINALYSIS, ROUTINE W REFLEX MICROSCOPIC - Abnormal; Notable for the following components:   APPearance HAZY (*)    Ketones, ur 5 (*)    Leukocytes,Ua MODERATE (*)  WBC, UA >50 (*)    Bacteria, UA RARE (*)    Non Squamous Epithelial 0-5 (*)    All other components within normal limits  PROTIME-INR - Abnormal; Notable for the following components:   Prothrombin Time 26.8 (*)    INR 2.5 (*)    All other components within normal limits  RESP PANEL BY RT-PCR (FLU A&B, COVID) ARPGX2  URINE CULTURE  LIPASE, BLOOD  CBC  POTASSIUM  CREATININE, SERUM    EKG None  Radiology CT ABDOMEN PELVIS W CONTRAST  Result Date: 08/09/2021 CLINICAL DATA:  Lower abdominal pain. EXAM: CT ABDOMEN AND PELVIS WITH CONTRAST TECHNIQUE: Multidetector CT imaging of the abdomen and pelvis was performed using the standard protocol following bolus administration of intravenous contrast. RADIATION DOSE REDUCTION: This exam was performed according to the departmental dose-optimization program which includes automated exposure control, adjustment of the mA and/or kV according to patient size and/or use of iterative reconstruction technique. CONTRAST:  176m OMNIPAQUE IOHEXOL 300 MG/ML  SOLN COMPARISON:  09/29/2018 FINDINGS: Lower chest: No acute abnormality. Cardiomegaly. Aortic valve calcifications. Coronary artery calcifications. Scarring and or fibrosis of the included bilateral lung bases. Hepatobiliary: No focal liver abnormality is seen. Status post cholecystectomy. No biliary dilatation. Pancreas: Unremarkable. No pancreatic ductal dilatation or surrounding inflammatory changes. Spleen: Normal in size without significant abnormality. Adrenals/Urinary Tract: Adrenal glands are unremarkable.  Kidneys are normal, without renal calculi, solid lesion, or hydronephrosis. Bladder is unremarkable. Stomach/Bowel: Stomach is within normal limits. The appendix is fluid-filled, slightly hyperenhancing, and nondilated, however there is extensive adjacent fat stranding and small locules of extraluminal air (series 2, image 51). No evidence of bowel wall thickening, distention, or inflammatory changes. Vascular/Lymphatic: Aortic atherosclerosis. No enlarged abdominal or pelvic lymph nodes. Reproductive: Status post hysterectomy. Other: No abdominal wall hernia or abnormality. No ascites. Musculoskeletal: No acute or significant osseous findings. IMPRESSION: 1. The appendix is fluid-filled, slightly hyperenhancing, and nondilated, however there is extensive adjacent fat stranding and small locules of extraluminal air. Findings are consistent with acute appendicitis complicated by micro perforation. 2. Cardiomegaly and coronary artery disease. These results were called by telephone at the time of interpretation on 07/22/2021 at 5:01 pm to [place], who verbally acknowledged these results. Aortic Atherosclerosis (ICD10-I70.0). Electronically Signed   By: ADelanna AhmadiM.D.   On: 07/23/2021 17:04    Procedures Procedures    Medications Ordered in ED Medications  lactated ringers bolus 1,000 mL (has no administration in time range)  albumin human 5 % solution 12.5 g (has no administration in time range)  piperacillin-tazobactam (ZOSYN) IVPB 3.375 g (has no administration in time range)  fentaNYL (SUBLIMAZE) injection 25-50 mcg (has no administration in time range)  methocarbamol (ROBAXIN) 1,000 mg in dextrose 5 % 100 mL IVPB (has no administration in time range)  acetaminophen (TYLENOL) tablet 325-650 mg (has no administration in time range)  acetaminophen (TYLENOL) suppository 650 mg (has no administration in time range)  ondansetron (ZOFRAN) injection 4 mg (has no administration in time range)    Or   ondansetron (ZOFRAN) 8 mg in sodium chloride 0.9 % 50 mL IVPB (has no administration in time range)  prochlorperazine (COMPAZINE) injection 5-10 mg (has no administration in time range)  lip balm (CARMEX) ointment 1 application (has no administration in time range)  phenol (CHLORASEPTIC) mouth spray 2 spray (has no administration in time range)  menthol-cetylpyridinium (CEPACOL) lozenge 3 mg (has no administration in time range)  magic mouthwash (has no administration in  time range)  alum & mag hydroxide-simeth (MAALOX/MYLANTA) 200-200-20 MG/5ML suspension 30 mL (has no administration in time range)  simethicone (MYLICON) 40 UL/8.4TX suspension 80 mg (has no administration in time range)  bisacodyl (DULCOLAX) suppository 10 mg (has no administration in time range)  metoprolol tartrate (LOPRESSOR) injection 5 mg (has no administration in time range)  enalaprilat (VASOTEC) injection 0.625-1.25 mg (has no administration in time range)  iohexol (OMNIPAQUE) 300 MG/ML solution 100 mL (100 mLs Intravenous Contrast Given 08/03/2021 1636)    ED Course/ Medical Decision Making/ A&P                           Medical Decision Making Amount and/or Complexity of Data Reviewed Labs: ordered. Radiology: ordered.  Risk Prescription drug management.   BP (!) 126/59 (BP Location: Left Arm)    Pulse 94    Temp 97.8 F (36.6 C) (Oral)    Resp 17    Ht 5' (1.524 m)    Wt 59.4 kg    SpO2 95%    BMI 25.58 kg/m   3:46 PM This is an elderly female who presents with complaints of lower abdominal pain ongoing for the past 1 week.  She does have tenderness along the lower abdomen on exam which warranted a CT scan of her abdomen pelvis for further assessment.  Patient overall well-appearing  5:26 PM Labs and imaging independently reviewed and interpreted by me.  Urinalysis shows moderate leukocyte with greater than 50 WBC suggestive of UTI.  I have initially ordered 1 g of Rocephin.  Negative viral respiratory  panel, normal white count, labs are reassuring, INR is 2.5.  An abdominal and pelvis CT scan obtained shows signs of acute appendicitis complicated by microperforation.  I appreciate consultation to general surgeon, Dr. Johney Maine, who acknowledged the results and recommend medicine admission, antibiotic, he will see patient either tonight or early tomorrow to determine if she would benefit from surgery.  I did discuss this finding with patient and daughter who is at bedside.  Patient states if she does need surgery then she is okay going through it.  5:40 PM Appreciate consultation from [facility] Dr. Cyndia Skeeters who agrees to see and will admit pt for further care. Pt now report some L shoulder pain, which I suspect could be due to diaphragmatic irritation from her appendicitis with perforation.  Will provide pain medication. Care discussed with Dr. Eulis Foster.   This patient presents to the ED for concern of abd pain, this involves an extensive number of treatment options, and is a complaint that carries with it a high risk of complications and morbidity.  The differential diagnosis includes diverticulitis, colitis, appendicitis, UTI, pyelonephritis, kidney stone, viral illness.  Co morbidities that complicate the patient evaluation age  Recurrent UTI Additional history obtained:  Additional history obtained from EMS External records from outside source obtained and reviewed including notes from PCP  Lab Tests:  I Ordered, and personally interpreted labs.  The pertinent results include:  as above  Imaging Studies ordered:  I ordered imaging studies including abd/pelvis CT I independently visualized and interpreted imaging which showed acute appendicitis with microperforation I agree with the radiologist interpretation  Cardiac Monitoring:  The patient was maintained on a cardiac monitor.  I personally viewed and interpreted the cardiac monitored which showed an underlying rhythm of:  irregularly irregular  Medicines ordered and prescription drug management:  I ordered medication including rocephin/metronidazole  for acute appendicitis Reevaluation  of the patient after these medicines showed that the patient improved I have reviewed the patients home medicines and have made adjustments as needed  Test Considered: as above  Critical Interventions: IV antibiotic  IV opiate pain medication  Consultations Obtained:  I requested consultation with the general surgeon,  and discussed lab and imaging findings as well as pertinent plan - they recommend: hospital admission and abx  Problem List / ED Course: acute appendicitis  Reevaluation:  After the interventions noted above, I reevaluated the patient and found that they have :improved  Social Determinants of Health: age  dementia  Dispostion:  After consideration of the diagnostic results and the patients response to treatment, I feel that the patent would benefit from admission.         Final Clinical Impression(s) / ED Diagnoses Final diagnoses:  Acute appendicitis with perforation and localized peritonitis, without abscess or gangrene    Rx / DC Orders ED Discharge Orders     None         [name], PA-C 08/07/2021 1759    Daleen Bo, MD 08/08/2021 1859

## 2021-07-16 NOTE — Assessment & Plan Note (Deleted)
Resume home oxybutynin once she started taking p.o. ?

## 2021-07-16 NOTE — H&P (Signed)
History and Physical    Patient: [name] TKW:[id number] DOB: 1922-07-30 DOA: 07/19/2021 DOS: the patient was seen and examined on 08/11/2021 PCP: [name], MD  Patient coming from: ALF/ILF (friend's home)  Chief Complaint: No chief complaint on file.   HPI: [name] is a 88 y.o. female with PMH of dementia, diastolic CHF, A-fib/DVT on warfarin, AVB/PPM, CKD-3A, anemia and hypothyroidism presenting with abdominal pain.  History provided by patient and patient's daughter at bedside.  Patient is not a great historian.  Patient has intermittent abdominal pain for about a week that has gotten worse over the last 24 hours.  She describes the pain as bad and generalized.  No radiation.  No specific provoking or elevating factor.  She denies fever, nausea nausea, vomiting, diarrhea, chest pain, dizziness, dyspnea or cough.  She denies UTI symptoms other than chronic incontinence.  She has chronic lower extremity edema.  She denies orthopnea.   She lives at friend's home.  Uses rollator for ambulation.  Denies smoking cigarette, drinking alcohol recreational drug use.  She has DNR/DNI paper at bedside.  Confirmed with daughter.   In ED, vitals stable.  Afebrile.  CMP and CBC without significant finding other creatinine of 1.02 and Hgb of 10.2.  No leukocytosis.  COVID-19 and influenza PCR nonreactive.  UA not convincing for UTI.  CT abdomen and pelvis concerning for acute appendicitis with possible microperforation.  INR 2.5.  General surgery consulted.  Started IV Zosyn and recommended medicine admission.   Review of Systems: As mentioned in the history of present illness. All other systems reviewed and are negative. Past Medical History:  Diagnosis Date   Arthritis    Atrial fibrillation ([place])    Cataract    removed bilateral   Chronic diastolic CHF (congestive heart failure) ([place]) 06/02/2020   Clotting disorder ([place])    h/o blood clot left leg   Dementia (HCC)     Depression    DVT (deep venous thrombosis) ([place]) 2011   GERD (gastroesophageal reflux disease)    HB (heart block)    s/p PPM, most recent generator change 12/11 by JA   Heart murmur    Hiatal hernia    Hyperlipidemia    Hypertension    Hypothyroidism    Osteoporosis    PTE (post-transplant erythrocytosis)    Pulmonary embolism ([place]) 2011   on coumadin   UTI (lower urinary tract infection)    Past Surgical History:  Procedure Laterality Date   BLADDER SURGERY     twice   cataracts     bilateral   CHOLECYSTECTOMY  09/2010   COLONOSCOPY     ESOPHAGOGASTRODUODENOSCOPY     HEMORRHOID SURGERY     hysterectomy (other)     INNER EAR SURGERY     JOINT REPLACEMENT     pacemaker     most recent generator 12/11 by Irene ARTHROPLASTY Left    Social History:  reports that she has never smoked. She has never used smokeless tobacco. She reports that she does not drink alcohol and does not use drugs.  Allergies  Allergen Reactions   Hydrocodone Other (See Comments)    Dizziness & jerking   Alendronate Other (See Comments)    Can not tolerate   Celebrex [Celecoxib] Other (See Comments)    Can not tolerate   Meloxicam Other (See Comments)    Avoid    Myrbetriq [Mirabegron] Other (See Comments)    Can not tolerate  Pradaxa [Dabigatran Etexilate Mesylate] Other (See Comments)    Can not tolerate   Toviaz [Fesoterodine Fumarate Er] Other (See Comments)    Can not tolerate   Vesicare [Solifenacin] Other (See Comments)    Can not tolerate    Family History  Problem Relation Age of Onset   Stroke Father    Heart disease Father    Arthritis Father    Heart disease Mother    Arthritis Mother    Stomach cancer Brother    Stomach cancer Brother    Liver cancer Brother    Breast cancer Sister    Colon cancer Neg Hx    Colon polyps Neg Hx    Esophageal cancer Neg Hx    Rectal cancer Neg Hx     Prior to Admission medications   Medication Sig Start Date End Date  Taking? Authorizing Provider  Calcium Carb-Cholecalciferol 600-800 MG-UNIT TABS Take 1 tablet by mouth daily.   Yes [provider]  cholecalciferol (VITAMIN D) 1000 UNITS tablet Take 1,000 Units by mouth daily.    Yes [provider]  denosumab (PROLIA) 60 MG/ML SOSY injection Inject 60 mg into the skin every 6 (six) months. Once A Day on the 1st of Every 6th Month   Yes [provider]  gabapentin (NEURONTIN) 100 MG capsule Take 2 capsules (200 mg total) by mouth daily with lunch. Patient taking differently: Take 100 mg by mouth daily with lunch. 09/19/20  Yes [place], Amy E, NP  gabapentin (NEURONTIN) 300 MG capsule TAKE 1 CAPSULE TWICE DAILY Patient taking differently: Take 300 mg by mouth 2 (two) times daily. 02/08/20  Yes [name], MD  levothyroxine (SYNTHROID) 50 MCG tablet TAKE 1 TABLET (50 MCG TOTAL) BY MOUTH DAILY BEFORE BREAKFAST. 02/17/20  Yes [name], MD  loratadine (CLARITIN) 10 MG tablet Take 10 mg by mouth daily.   Yes [provider]  meclizine (ANTIVERT) 25 MG tablet Take 25 mg by mouth 3 (three) times daily as needed for dizziness.   Yes [provider]  memantine (NAMENDA) 10 MG tablet Take 1 tablet (10 mg total) by mouth 2 (two) times daily. 07/05/20  Yes [name], MD  Multiple Vitamins-Minerals (PRESERVISION AREDS 2) CAPS Take 1 capsule by mouth 2 (two) times daily.   Yes [provider]  omeprazole (PRILOSEC) 20 MG capsule TAKE 1 CAPSULE TWICE DAILY Patient taking differently: Take 20 mg by mouth in the morning and at bedtime. 01/06/20  Yes [name], MD  oxybutynin (DITROPAN-XL) 5 MG 24 hr tablet Take 5 mg by mouth daily.   Yes [provider]  polyethylene glycol (MIRALAX / GLYCOLAX) 17 g packet Take 17 g by mouth daily.   Yes [provider]  potassium chloride SA (KLOR-CON) 20 MEQ tablet Take 20 mEq by mouth. Once A Day on Mon, Wed, Fri   Yes [provider]  pravastatin  (PRAVACHOL) 20 MG tablet TAKE 1 TABLET EVERY DAY Patient taking differently: Take 20 mg by mouth daily. 05/23/20  Yes [name], MD  sertraline (ZOLOFT) 50 MG tablet Take 50 mg by mouth as directed. 1 tablet by mouth in the am and 1/2 tablet by mouth in the pm   Yes [provider]  torsemide (DEMADEX) 20 MG tablet Take 20 mg by mouth 3 (three) times a week. Take on Monday, Wednesday, and Friday   Yes [provider]  warfarin (COUMADIN) 5 MG tablet Take 5 mg by mouth  as directed. On Sunday, Tuesday, Wednesday, Thursday, and Saturday at 6pm. See other listing   Yes [provider]  warfarin (COUMADIN) 6 MG tablet Take 6 mg by mouth daily. Monday and Friday see other listing   Yes [provider]  acetaminophen (TYLENOL) 325 MG tablet Take 650 mg by mouth 3 (three) times daily. Patient not taking: Reported on 07/28/2021    [provider]  ammonium lactate (LAC-HYDRIN) 12 % lotion Apply 1 application topically as needed for dry skin. Patient not taking: Reported on 08/01/2021    [provider]    Physical Exam: Vitals:   07/26/2021 1533 07/28/2021 1542 08/02/2021 1630 08/02/2021 1700  BP: (!) 126/59  (!) 122/58 111/62  Pulse: 94  92 97  Resp: 17  19   Temp: 97.8 F (36.6 C)     TempSrc: Oral     SpO2: 95%  91% 92%  Weight:  59.4 kg    Height:  5' (1.524 m)     GENERAL: No apparent distress.  Nontoxic. HEENT: MMM.  Vision and hearing grossly intact.  NECK: Supple.  No apparent JVD.  RESP:  No IWOB.  Fair aeration bilaterally. CVS:  RRR.  3/6 SEM globally ABD/GI/GU: BS+. Abd soft.  Tenderness over RLQ. MSK/EXT:  Moves extremities. No apparent deformity.  1-2 BLE edema.  SKIN: no apparent skin lesion or wound NEURO: Awake and alert. Oriented x4 except date and months.  No apparent focal neuro deficit. PSYCH: Calm. Normal affect.  Good sense of humor.  Data Reviewed: Per HPI  Assessment and Plan: * Acute perforated  appendicitis Presents with abdominal pain for about a week that has gotten worse over the last 24 hours.  Exam was RLQ tenderness.  CT abdomen and pelvis concerning for acute appendicitis with microperforation.  Appears well.  Vital stable. -General surgery managing. -On IV Zosyn per surgery Lyndel Safe risk score 1.7% risk of MI intraoperatively or 30 days postop -On warfarin.  INR 2.5.  Hold warfarin.  Consider IV heparin once INR < 2 -Can consider INR reversal with FFP if surgery is pursued -She has prominent heart murmur.  Last TTE in 2016 with mild AV stenosis.  Repeat TTE -Cautious with IV fluid given diastolic CHF -N.p.o. except ice chips -Pain control.  Left shoulder pain Somewhat acute but patient is not a reliable historian.  Not able to describe other than radiation down the arm.  She has history of osteoarthritis.  She has fair range of motion without pain.  No focal tenderness, swelling or erythema.  She denies chest pain.  I doubt ACS.  No acute ischemic finding on EKG either. -Reasonable to check troponin but low suspicion for ACS. -Follow TTE -Already on IV analgesics for appendicitis  Heart murmur TTE in 2016 with LVEF of 60 to 65%, G1 DD, mild AS and PAP P of 39 -Check TTE  Chronic diastolic CHF (congestive heart failure) (HCC) No cardiopulmonary symptoms.  She has chronic 1-2 BLE edema.  On torsemide at home. -Hold home torsemide -Be cautious with IV fluids -Strict I&O  History of DVT (deep vein thrombosis) Remote but not clear if it is provoked or not -Anticoagulation as above  PAROXYSMAL ATRIAL FIBRILLATION Rate controlled without medication.  On warfarin for anticoagulation -INR 2.5.  Hold warfarin.  Consider IV heparin once INR less than 2.0  Normocytic anemia Recent Labs    10/20/20 0000 11/24/20 0000 02/14/21 0000 07/22/2021 1525  HGB 11.0* 12.7 12.8 10.2*  -Check anemia panel  in the morning -Monitor H&H -Patient and daughter okay with blood transfusion  if indicated   Chronic kidney disease, stage 3a ([place]) Recent Labs    07/21/20 0000 10/20/20 0000 11/24/20 0000 02/14/21 0000 08/06/2021 1525  BUN 30* 32* 39* 34* 24*  CREATININE 1.1 0.9 1.1 1.2* 1.02*  Creatinine at baseline. -Monitor   Depression, recurrent (HCC) Resume home Zoloft once she started taking p.o.  GERD (gastroesophageal reflux disease) IV PPI while n.p.o.  Mixed incontinence urge and stress Resume home oxybutynin once she started taking p.o.  Cognitive impairment Oriented x4 except date of months.  Good sense of humor.  She has hard of hearing -Reorientation and delirium precaution. -Resume home meds once she started taking p.o.  Hypothyroidism Resume home Synthroid once she started taking p.o. IV Synthroid if she remains n.p.o. over 72 hours  Third degree AV block S/p PPM Stable    Advance Care Planning:   Code Status: DNR Patient is DNR paper.  Also confirm with patient's daughter  Consults: General surgery  Family Communication: Updated patient's sister at bedside  Severity of Illness: The appropriate patient status for this patient is INPATIENT. Inpatient status is judged to be reasonable and necessary in order to provide the required intensity of service to ensure the patient's safety. The patient's presenting symptoms, physical exam findings, and initial radiographic and laboratory data in the context of their chronic comorbidities is felt to place them at high risk for further clinical deterioration. Furthermore, it is not anticipated that the patient will be medically stable for discharge from the hospital within 2 midnights of admission.   * I certify that at the point of admission it is my clinical judgment that the patient will require inpatient hospital care spanning beyond 2 midnights from the point of admission due to high intensity of service, high risk for further deterioration and high frequency of surveillance required.*  Author: Mercy Riding, MD 08/02/2021 6:37 PM  For on call review [email or web address].

## 2021-07-17 ENCOUNTER — Inpatient Hospital Stay (HOSPITAL_COMMUNITY): Payer: Medicare Other

## 2021-07-17 DIAGNOSIS — R008 Other abnormalities of heart beat: Secondary | ICD-10-CM | POA: Diagnosis not present

## 2021-07-17 DIAGNOSIS — E440 Moderate protein-calorie malnutrition: Secondary | ICD-10-CM

## 2021-07-17 DIAGNOSIS — R011 Cardiac murmur, unspecified: Secondary | ICD-10-CM | POA: Diagnosis not present

## 2021-07-17 DIAGNOSIS — M25512 Pain in left shoulder: Secondary | ICD-10-CM | POA: Diagnosis not present

## 2021-07-17 DIAGNOSIS — D509 Iron deficiency anemia, unspecified: Secondary | ICD-10-CM

## 2021-07-17 DIAGNOSIS — K3532 Acute appendicitis with perforation and localized peritonitis, without abscess: Secondary | ICD-10-CM | POA: Diagnosis not present

## 2021-07-17 DIAGNOSIS — K353 Acute appendicitis with localized peritonitis, without perforation or gangrene: Secondary | ICD-10-CM

## 2021-07-17 DIAGNOSIS — I5032 Chronic diastolic (congestive) heart failure: Secondary | ICD-10-CM | POA: Diagnosis not present

## 2021-07-17 LAB — RENAL FUNCTION PANEL
Albumin: 3.1 g/dL — ABNORMAL LOW (ref 3.5–5.0)
Anion gap: 7 (ref 5–15)
BUN: 14 mg/dL (ref 8–23)
CO2: 25 mmol/L (ref 22–32)
Calcium: 8.6 mg/dL — ABNORMAL LOW (ref 8.9–10.3)
Chloride: 107 mmol/L (ref 98–111)
Creatinine, Ser: 0.58 mg/dL (ref 0.44–1.00)
GFR, Estimated: >60 mL/min (ref >60)
Glucose, Bld: 121 mg/dL — ABNORMAL HIGH (ref 70–99)
Phosphorus: 2.3 mg/dL — ABNORMAL LOW (ref 2.5–4.6)
Potassium: 3.7 mmol/L (ref 3.5–5.1)
Sodium: 139 mmol/L (ref 135–145)

## 2021-07-17 LAB — CBC
HCT: 33.1 % — ABNORMAL LOW (ref 36.0–46.0)
Hemoglobin: 10.3 g/dL — ABNORMAL LOW (ref 12.0–15.0)
MCH: 30.7 pg (ref 26.0–34.0)
MCHC: 31.1 g/dL (ref 30.0–36.0)
MCV: 98.5 fL (ref 80.0–100.0)
Platelets: 163 10*3/uL (ref 150–400)
RBC: 3.36 MIL/uL — ABNORMAL LOW (ref 3.87–5.11)
RDW: 15.9 % — ABNORMAL HIGH (ref 11.5–15.5)
WBC: 10.2 10*3/uL (ref 4.0–10.5)
nRBC: 0 % (ref 0.0–0.2)

## 2021-07-17 LAB — ECHOCARDIOGRAM COMPLETE
AR max vel: 0.61 cm2
AV Area VTI: 0.55 cm2
AV Area mean vel: 0.51 cm2
AV Mean grad: 42 mmHg
AV Peak grad: 62.7 mmHg
Ao pk vel: 3.96 m/s
Area-P 1/2: 4.04 cm2
Height: 60 in
MV M vel: 5.97 m/s
MV Peak grad: 142.6 mmHg
MV VTI: 1.28 cm2
S' Lateral: 2.8 cm
Weight: 2096 oz

## 2021-07-17 LAB — FERRITIN: Ferritin: 52 ng/mL (ref 11–307)

## 2021-07-17 LAB — MAGNESIUM: Magnesium: 2.2 mg/dL (ref 1.7–2.4)

## 2021-07-17 LAB — PROTIME-INR
INR: 2.3 — ABNORMAL HIGH (ref 0.8–1.2)
Prothrombin Time: 25.3 seconds — ABNORMAL HIGH (ref 11.4–15.2)

## 2021-07-17 LAB — HEMOGLOBIN A1C
Hgb A1c MFr Bld: 5.3 % (ref 4.8–5.6)
Mean Plasma Glucose: 105.41 mg/dL

## 2021-07-17 LAB — CREATININE, SERUM
Creatinine, Ser: 0.58 mg/dL (ref 0.44–1.00)
GFR, Estimated: >60 mL/min (ref >60)

## 2021-07-17 LAB — PREALBUMIN: Prealbumin: 13.2 mg/dL — ABNORMAL LOW (ref 18–38)

## 2021-07-17 MED ORDER — KCL IN DEXTROSE-NACL 20-5-0.9 MEQ/L-%-% IV SOLN
INTRAVENOUS | Status: AC
Start: 2021-07-17 — End: 2021-07-19
  Filled 2021-07-17 (×5): qty 1000

## 2021-07-17 NOTE — Progress Notes (Signed)
PM check: ?Patient comfortable.  Getting her ECHO currently.  Still with some RLQ tenderness, not much different than this morning yet.  Daughter in the room at bedside.  Discussed plan of care to attempt conservative management for her mother, but if she were to fail to improve then we may need to consider surgical intervention.  We will continue to follow. ? ?Henreitta Cea ?3:40 PM ?07/17/2021 ? ?

## 2021-07-17 NOTE — Progress Notes (Signed)
PROGRESS NOTE  [name] WNU:[id number] DOB: May 30, 1922   PCP: Mahlon Gammon, MD  Patient is from: ALF/[facility] home.  Uses rollator at baseline.  DOA: 07/16/2021 LOS: 1  Chief complaints Abdominal pain    Brief Narrative / Interim history: 88 y.o. female with PMH of dementia, diastolic CHF, A-fib/DVT on warfarin, AVB/PPM, CKD-3A, anemia and hypothyroidism presenting with bilateral lower quadrant pain, right> left for about a week that has gotten worse 24 hours prior to presentation, and admitted for acute perforated but contained appendicitis.  General surgery consulted.  Started on IV Zosyn.    Subjective: Seen and examined earlier this morning.  No major events overnight or this morning.  She endorses abdominal pain over RLQ and mid left abdomen.  Worse in RLQ.  Not able to describe or rate but bad enough to catch a attention.  Left shoulder pain resolved.  She denies chest pain, shortness of breath, nausea, vomiting, diarrhea or UTI symptoms.  Objective: Vitals:   07/16/21 1934 07/17/21 0141 07/17/21 0618 07/17/21 0900  BP: 128/65 128/69 (!) 117/57 117/60  Pulse: 99 (!) 107 95 93  Resp: 18 16 16 16   Temp: 98.2 F (36.8 C) 98.8 F (37.1 C) 99.2 F (37.3 C) 99.5 F (37.5 C)  TempSrc: Oral Oral Oral Oral  SpO2: 92% (!) 89% 95% 94%  Weight:      Height:        Examination:  GENERAL: No apparent distress.  Nontoxic. HEENT: MMM.  Vision and hearing grossly intact.  NECK: Supple.  No apparent JVD.  RESP:  No IWOB.  Fair aeration bilaterally. CVS:  RRR.  3/6 SEM globally ABD/GI/GU: BS+. Abd soft, NTND.  MSK/EXT:  Moves extremities. No apparent deformity. No edema.  SKIN: no apparent skin lesion or wound NEURO: Awake, alert and fairly oriented.  No apparent focal neuro deficit. PSYCH: Calm. Normal affect.   Procedures:  None  Microbiology summarized: COVID-19 and influenza PCR nonreactive. Urine culture pending  Assessment and Plan: * Acute perforated  appendicitis Presents with abdominal pain for a week. Worse the night before presentation.  Exam was RLQ tenderness.  CT A/P concerning for acute appendicitis with contained microperforation.  Appears well.  Vital stable. -General surgery managing. -On IV Zosyn per surgery Chales Abrahams risk score 1.7% risk of MI intraoperatively or 30 days postop -On warfarin.  INR 2.5> 2.3.  Hold warfarin.  Consider IV heparin once INR < 2 as a bridge -Can reverse INR with FFP if surgery to be pursued early -She has prominent heart murmur.  Last TTE in 2016 with mild AV stenosis.  Repeat TTE pending -Cautious with IV fluid given diastolic CHF -N.p.o. except ice chips -Pain control.  Left shoulder pain Somewhat acute but patient is not a reliable historian.  Exam reassuring.  EKG without acute ischemic finding.  Serial troponins negative.  Resolved.   -Already on IV analgesics for appendicitis  Heart murmur TTE in 2016 with LVEF of 60 to 65%, G1 DD, mild AS and PAPP of 39 -Follow TTE  Chronic diastolic CHF (congestive heart failure) (HCC) No cardiopulmonary symptoms.  She has chronic 1-2 BLE edema.  On torsemide at home. -Hold home torsemide -Be cautious with IV fluids -Strict I&O and daily weight -Follow repeat TTE  History of DVT (deep vein thrombosis) Remote but not clear if it is provoked or not -Anticoagulation as above  PAROXYSMAL ATRIAL FIBRILLATION Rate controlled without medication.  On warfarin for anticoagulation -INR 2.5> 2.3.  Hold warfarin.  Can bridge with IV heparin once INR less than 2.0 -IV metoprolol 2.5 mg every 6 hours as needed sustained HR > 100  Protein-calorie malnutrition, moderate (HCC) Prealbumin 13.2.  Body mass index is 25.58 kg/m. -We will consult dietitian once she started eating  Iron deficiency anemia Recent Labs    10/20/20 0000 11/24/20 0000 02/14/21 0000 07/16/21 1525 07/17/21 0405  HGB 11.0* 12.7 12.8 10.2* 10.3*  -Iron saturation of 6%, low normal  ferritin and normal TIBC.  No report of melena or hematochezia. -Monitor H&H-stable -Patient and daughter verbally consented if blood transfusion is indicated -Will give IV iron prior to discharge once infection calms down   Chronic kidney disease, stage 3a (HCC) Recent Labs    07/21/20 0000 10/20/20 0000 11/24/20 0000 02/14/21 0000 07/16/21 1525 07/17/21 0405  BUN 30* 32* 39* 34* 24* 14  CREATININE 1.1 0.9 1.1 1.2* 1.02* 0.58  0.58  Creatinine down to 0.6.  Baseline 0.9-1.1 -Monitor   Depression, recurrent (HCC) Resume home Zoloft once she started taking p.o.  GERD (gastroesophageal reflux disease) IV PPI while n.p.o.  Mixed incontinence urge and stress Resume home oxybutynin once she started taking p.o.  Cognitive impairment Oriented x4 except date of months.  Good sense of humor.  She has hard of hearing -Reorientation and delirium precaution. -Resume home meds once she started taking p.o.  Hypothyroidism Resume home Synthroid once she started taking p.o. IV Synthroid if she remains n.p.o. over 72 hours  Third degree AV block S/p PPM EKG with V paced rhythm.   DVT prophylaxis:  INR therapeutic.  Warfarin on hold in case of surgery  Code Status: DNR/DNI Family Communication: Patient and/or RN. Available if any question.  Level of care: Med-Surg Status is: Inpatient Remains inpatient appropriate because: Acute appendicitis requiring IV antibiotics and possible surgery.   Final disposition: Likely back to ALF once medically stable   Consultants:  General surgery  Sch Meds:  Scheduled Meds:  lip balm  1 application. Topical BID   pantoprazole (PROTONIX) IV  40 mg Intravenous Q24H   Continuous Infusions:  albumin human     dextrose 5 % and 0.9 % NaCl with KCl 20 mEq/L 60 mL/hr at 07/17/21 1045   methocarbamol (ROBAXIN) IV     ondansetron (ZOFRAN) IV     piperacillin-tazobactam (ZOSYN)  IV 3.375 g (07/17/21 0936)   PRN Meds:.acetaminophen,  acetaminophen, albumin human, alum & mag hydroxide-simeth, bisacodyl, enalaprilat, fentaNYL (SUBLIMAZE) injection, magic mouthwash, menthol-cetylpyridinium, methocarbamol (ROBAXIN) IV, metoprolol tartrate, ondansetron (ZOFRAN) IV **OR** ondansetron (ZOFRAN) IV, phenol, prochlorperazine, simethicone  Antimicrobials: Anti-infectives (From admission, onward)    Start     Dose/Rate Route Frequency Ordered Stop   07/17/21 0200  piperacillin-tazobactam (ZOSYN) IVPB 3.375 g        3.375 g 12.5 mL/hr over 240 Minutes Intravenous Every 8 hours 07/16/21 1733 07/22/21 0159   07/16/21 1815  piperacillin-tazobactam (ZOSYN) IVPB 3.375 g        3.375 g 100 mL/hr over 30 Minutes Intravenous  Once 07/16/21 1802 07/16/21 1902   07/16/21 1730  metroNIDAZOLE (FLAGYL) IVPB 500 mg  Status:  Discontinued        500 mg 100 mL/hr over 60 Minutes Intravenous  Once 07/16/21 1725 07/16/21 1733   07/16/21 1715  cefTRIAXone (ROCEPHIN) 1 g in sodium chloride 0.9 % 100 mL IVPB  Status:  Discontinued        1 g 200 mL/hr over 30 Minutes Intravenous  Once 07/16/21 1709 07/16/21 1725  07/16/21 1645  cefTRIAXone (ROCEPHIN) 1 g in sodium chloride 0.9 % 100 mL IVPB  Status:  Discontinued        1 g 200 mL/hr over 30 Minutes Intravenous  Once 07/16/21 1640 07/16/21 1730        I have personally reviewed the following labs and images: CBC: Recent Labs  Lab 07/16/21 1525 07/17/21 0405  WBC 9.9 10.2  NEUTROABS 7.5  --   HGB 10.2* 10.3*  HCT 33.2* 33.1*  MCV 98.2 98.5  PLT 160 163   BMP &GFR Recent Labs  Lab 07/16/21 1525 07/17/21 0405  NA 141 139  K 3.6 3.7  CL 105 107  CO2 27 25  GLUCOSE 150* 121*  BUN 24* 14  CREATININE 1.02* 0.58  0.58  CALCIUM 9.1 8.6*  MG  --  2.2  PHOS  --  2.3*   Estimated Creatinine Clearance: 33.2 mL/min (by C-G formula based on SCr of 0.58 mg/dL). Liver & Pancreas: Recent Labs  Lab 07/16/21 1525 07/17/21 0405  AST 13*  --   ALT 8  --   ALKPHOS 79  --   BILITOT 0.4   --   PROT 6.0*  --   ALBUMIN 3.2* 3.1*   Recent Labs  Lab 07/16/21 1525  LIPASE 44   No results for input(s): AMMONIA in the last 168 hours. Diabetic: Recent Labs    07/17/21 0405  HGBA1C 5.3   No results for input(s): GLUCAP in the last 168 hours. Cardiac Enzymes: No results for input(s): CKTOTAL, CKMB, CKMBINDEX, TROPONINI in the last 168 hours. No results for input(s): PROBNP in the last 8760 hours. Coagulation Profile: Recent Labs  Lab 07/16/21 1525 07/17/21 0405  INR 2.5* 2.3*   Thyroid Function Tests: No results for input(s): TSH, T4TOTAL, FREET4, T3FREE, THYROIDAB in the last 72 hours. Lipid Profile: No results for input(s): CHOL, HDL, LDLCALC, TRIG, CHOLHDL, LDLDIRECT in the last 72 hours. Anemia Panel: Recent Labs    07/16/21 1824 07/16/21 1825  VITAMINB12 201  --   FOLATE 12.4  --   FERRITIN 52  --   TIBC 290  --   IRON 16*  --   RETICCTPCT  --  2.0   Urine analysis:    Component Value Date/Time   COLORURINE YELLOW 07/16/2021 1613   APPEARANCEUR HAZY (A) 07/16/2021 1613   LABSPEC 1.023 07/16/2021 1613   PHURINE 5.0 07/16/2021 1613   GLUCOSEU NEGATIVE 07/16/2021 1613   HGBUR NEGATIVE 07/16/2021 1613   BILIRUBINUR NEGATIVE 07/16/2021 1613   KETONESUR 5 (A) 07/16/2021 1613   PROTEINUR NEGATIVE 07/16/2021 1613   UROBILINOGEN 0.2 11/01/2013 1010   NITRITE NEGATIVE 07/16/2021 1613   LEUKOCYTESUR MODERATE (A) 07/16/2021 1613   Sepsis Labs: Invalid input(s): PROCALCITONIN, LACTICIDVEN  Microbiology: Recent Results (from the past 240 hour(s))  Resp Panel by RT-PCR (Flu A&B, Covid) Nasopharyngeal Swab     Status: None   Collection Time: 07/16/21  3:26 PM   Specimen: Nasopharyngeal Swab; Nasopharyngeal(NP) swabs in vial transport medium  Result Value Ref Range Status   SARS Coronavirus 2 by RT PCR NEGATIVE NEGATIVE Final    Comment: (NOTE) SARS-CoV-2 target nucleic acids are NOT DETECTED.  The SARS-CoV-2 RNA is generally detectable in upper  respiratory specimens during the acute phase of infection. The lowest concentration of SARS-CoV-2 viral copies this assay can detect is 138 copies/mL. A negative result does not preclude SARS-Cov-2 infection and should not be used as the sole basis for treatment or other patient management  decisions. A negative result may occur with  improper specimen collection/handling, submission of specimen other than nasopharyngeal swab, presence of viral mutation(s) within the areas targeted by this assay, and inadequate number of viral copies(<138 copies/mL). A negative result must be combined with clinical observations, patient history, and epidemiological information. The expected result is Negative.  Fact Sheet for Patients:  BloggerCourse.com  Fact Sheet for Healthcare Providers:  SeriousBroker.it  This test is no t yet approved or cleared by the [place] FDA and  has been authorized for detection and/or diagnosis of SARS-CoV-2 by FDA under an Emergency Use Authorization (EUA). This EUA will remain  in effect (meaning this test can be used) for the duration of the COVID-19 declaration under Section 564(b)(1) of the Act, 21 U.S.C.section 360bbb-3(b)(1), unless the authorization is terminated  or revoked sooner.       Influenza A by PCR NEGATIVE NEGATIVE Final   Influenza B by PCR NEGATIVE NEGATIVE Final    Comment: (NOTE) The Xpert Xpress SARS-CoV-2/FLU/RSV plus assay is intended as an aid in the diagnosis of influenza from Nasopharyngeal swab specimens and should not be used as a sole basis for treatment. Nasal washings and aspirates are unacceptable for Xpert Xpress SARS-CoV-2/FLU/RSV testing.  Fact Sheet for Patients: BloggerCourse.com  Fact Sheet for Healthcare Providers: SeriousBroker.it  This test is not yet approved or cleared by the [place] FDA and has been  authorized for detection and/or diagnosis of SARS-CoV-2 by FDA under an Emergency Use Authorization (EUA). This EUA will remain in effect (meaning this test can be used) for the duration of the COVID-19 declaration under Section 564(b)(1) of the Act, 21 U.S.C. section 360bbb-3(b)(1), unless the authorization is terminated or revoked.  Performed at [facility], [address] [place], [place] [address]     Radiology Studies: CT ABDOMEN PELVIS W CONTRAST  Result Date: 08/02/2021 CLINICAL DATA:  Lower abdominal pain. EXAM: CT ABDOMEN AND PELVIS WITH CONTRAST TECHNIQUE: Multidetector CT imaging of the abdomen and pelvis was performed using the standard protocol following bolus administration of intravenous contrast. RADIATION DOSE REDUCTION: This exam was performed according to the departmental dose-optimization program which includes automated exposure control, adjustment of the mA and/or kV according to patient size and/or use of iterative reconstruction technique. CONTRAST:  OMNIPAQUE IOHEXOL 300 MG/ML  SOLN COMPARISON:  09/29/2018 FINDINGS: Lower chest: No acute abnormality. Cardiomegaly. Aortic valve calcifications. Coronary artery calcifications. Scarring and or fibrosis of the included bilateral lung bases. Hepatobiliary: No focal liver abnormality is seen. Status post cholecystectomy. No biliary dilatation. Pancreas: Unremarkable. No pancreatic ductal dilatation or surrounding inflammatory changes. Spleen: Normal in size without significant abnormality. Adrenals/Urinary Tract: Adrenal glands are unremarkable. Kidneys are normal, without renal calculi, solid lesion, or hydronephrosis. Bladder is unremarkable. Stomach/Bowel: Stomach is within normal limits. The appendix is fluid-filled, slightly hyperenhancing, and nondilated, however there is extensive adjacent fat stranding and small locules of extraluminal air (series 2, image 51). No evidence of bowel wall thickening,  distention, or inflammatory changes. Vascular/Lymphatic: Aortic atherosclerosis. No enlarged abdominal or pelvic lymph nodes. Reproductive: Status post hysterectomy. Other: No abdominal wall hernia or abnormality. No ascites. Musculoskeletal: No acute or significant osseous findings. IMPRESSION: 1. The appendix is fluid-filled, slightly hyperenhancing, and nondilated, however there is extensive adjacent fat stranding and small locules of extraluminal air. Findings are consistent with acute appendicitis complicated by micro perforation. 2. Cardiomegaly and coronary artery disease. These results were called by telephone at the time of interpretation on 07/25/2021 at 5:01  pm to PA [facility] , who verbally acknowledged these results. Aortic Atherosclerosis (ICD10-I70.0). Electronically Signed   By: Jearld Lesch M.D.   On: 07/16/2021 17:04      Alyze Lauf T. [facility]  If 7PM-7AM, please contact night-coverage [email or web address] 07/17/2021, 11:21 AM

## 2021-07-17 NOTE — Consult Note (Signed)
[name]  1922-05-19 [id number]  CARE TEAM:  PCP: [name], MD  Outpatient Care Team: Patient Care Team: [name], MD as PCP - General (Internal Medicine) Jettie Booze, MD as PCP - Cardiology (Cardiology) Blanch Media, MD as Consulting Physician (Neurology) Thompson Grayer, MD as Consulting Physician (Clinical Cardiac Electrophysiology) [name], MD as Consulting Physician (Gastroenterology) Mast, Man X, NP as Nurse Practitioner (Internal Medicine) Gerarda Fraction, MD as Referring Physician (Ophthalmology)  Inpatient Treatment Team: Treatment Team: Attending Provider: Mercy Riding, MD; Consulting Physician: Nolon Nations, MD; Registered Nurse: Chucky May, RN; Rounding Team: Joycelyn Das, MD   This patient is a 88 y.o.female who presents today for surgical evaluation at the request of [name].   Chief complaint / Reason for evaluation: Perforated appendicitis and very elderly woman with multiple medical problem  This patient is a 88 y.o.female that there was a long Emergency Department.  Consultation requested by [name].  Daleen Bo.  Elderly woman lives at [place] that walks with a walker.  Limited activity.  DNR/DNI.  Multiple medical problems including congestive heart failure, pacemaker, history of DVTs and pulmonary emboli on chronic warfarin anticoagulation, chronic kidney disease.  She also has some dementia and cognitive impairment.  Apparently had intermittent abdominal pain for the past week that became more intense yesterday.  Reached out to primary care office.  They recommended laboratory and radiology evaluations and possible ER visit.  They decided to hold off through the weekend.  Patient felt more worse.  Brought in by EMS.  Complaining of lower abdominal pain.  Examination and CT scan concerning for appendicitis.  Patient still has some discomfort but feels much better already since coming  to the hospital.  No nausea or vomiting.  No diarrhea.   Assessment  [name]  88 y.o. female       Problem List:  Principal Problem:   Acute perforated appendicitis Active Problems:   Cognitive impairment   PAROXYSMAL ATRIAL FIBRILLATION   Long term (current) use of anticoagulants   Third degree AV block S/p PPM   History of DVT (deep vein thrombosis)   Hypothyroidism   Mixed incontinence urge and stress   GERD (gastroesophageal reflux disease)   Chronic diastolic CHF (congestive heart failure) (HCC)   Depression, recurrent (HCC)   Slow transit constipation   Chronic kidney disease, stage 3a (HCC)   Uses roller walker   Anticoagulated on warfarin   Arthritis   Hypertension   Anemia of chronic disease   Hiatal hernia   Heart murmur   Normocytic anemia   Left shoulder pain   Protein-calorie malnutrition, moderate (HCC)   IDA (iron deficiency anemia)   Perforated appendicitis most likely for the past 7 days in a patient with multiple medical problems  Plan:  Looking at the CAT scan there is phlegmon and inflammation and pockets of extraperitoneal gas strongly suspicious for perforated appendicitis.   That seems more consistent with a chronic issue for the past week.  While standard of care is to consider appendectomy, this is a complicated attack in a very elderly woman who is fully anticoagulated with multiple medical problems.  There is no need for emergency surgery tonight since she is not in shock, without any severe fevers or leukocytosis or severe peritonitis.  Start with resuscitation, IV antibiotics, nausea and pain control, hold anticoagulation, bowel rest, etc.  Medical admission and stabilization with surgical consultation.  Have a chance to objectively assess performance status and surgical risks.  Question of need to reevaluate cardiac clearance or not.  Can consider palliative care evaluation for goals of care.   N.p.o. with ice chips and bowel rest  for now.  NG tube if much worse.  She does not seem to have massive stomach or small bowel dilatation so try and hold off.  Her albumin is decreased at 3.2.  Check prealbumin to assess baseline nutritional status.   IV antibiotics.  I would do IV Zosyn given the complicated appendicitis.  Low threshold to do repeat CAT scan to rule delayed abscess in 4-5 days, especially in light of her fair ability to be a reliable historian or offer reliable examination.[name]  Possible diagnostic laparoscopy and appendectomy if INR corrected and felt to be a reasonable operative risk.  Would have to defer to oncoming surgeon, [name], tomorrow to see what his final input and recommendations since he will be running the inpatient surgical hospital service for the oncoming week   History of recurrent UTIs with dirty UA.  Agree with urine culture.  Has grown up partially resistant Klebsiella.  Resistant ampicillin but sensitive to piperacillin/Tazobactam.  Therefore Zosyn should be appropriate for now.  She is fully anticoagulated.  Would hold her anticoagulation and allow her INR to normalize.  Most likely can proceed with surgery or drainage of delayed abscess if INR less than 1.7.  She does have anemia and is fully anticoagulated.  Followed by [facility].  Dr. [name] removed a gastric polyp in the stomach that was hyperplastic within the past couple of years.  Has documented small sliding hiatal hernia on prior endoscopies but last 1 did not show any major Cameron ulcerations.  I do not think she has had a colonoscopy in over a decade.  Check iron studies.  Looks like they have only checked ferritins for a while   She is DNR and DNI.  Need to get an assessment from her patient's healthcare power of attorney of how aggressive they wish to be.  Patient did declare that she would be willing to consider surgery if needed.  -VTE prophylaxis- SCDs, etc  -mobilize as tolerated to help recovery  I reviewed  nursing notes, ED provider notes, hospitalist notes, last 24 h vitals and pain scores, last 48 h intake and output, last 24 h labs and trends, and last 24 h imaging results. I have reviewed this patient's available data, including medical history, events of note, test results, etc as part of my evaluation.  A significant portion of that time was spent in counseling.  Care during the described time interval was provided by me.  This care required high  level of medical decision making.  07/17/2021  Adin Hector, MD, FACS, MASCRS Esophageal, Gastrointestinal & Colorectal Surgery Robotic and Minimally Invasive Surgery  [facility], [place]. [address] [place], [place] [address] [phone] Fax [phone] Main  CONTACT INFORMATION:  Weekday (9AM-5PM): Call [facility] main office at [phone]  Weeknight (5PM-9AM) or Weekend/Holiday: Check [email or web address] (password " [email or web address]") for General Surgery [facility] coverage  (Please, do not use SecureChat as it is not reliable communication to operating surgeons for immediate patient care)      07/17/2021      Past Medical History:  Diagnosis Date   Arthritis    Atrial fibrillation ([place])    Cataract    removed bilateral  Chronic diastolic CHF (congestive heart failure) ([place]) 06/02/2020   Clotting disorder ([place])    h/o blood clot left leg   Dementia (HCC)    Depression    DVT (deep venous thrombosis) ([place]) 2011   GERD (gastroesophageal reflux disease)    HB (heart block)    s/p PPM, most recent generator change 12/11 by JA   Heart murmur    Hiatal hernia    Hyperlipidemia    Hypertension    Hypothyroidism    Osteoporosis    PTE (post-transplant erythrocytosis)    Pulmonary embolism ([place]) 2011   on coumadin   UTI (lower urinary tract infection)     Past Surgical History:  Procedure Laterality Date   BLADDER SURGERY     twice   cataracts     bilateral    CHOLECYSTECTOMY  09/2010   COLONOSCOPY     ESOPHAGOGASTRODUODENOSCOPY     HEMORRHOID SURGERY     hysterectomy (other)     INNER EAR SURGERY     JOINT REPLACEMENT     pacemaker     most recent generator 12/11 by [place] ARTHROPLASTY Left     Social History   Socioeconomic History   Marital status: Married    Spouse name: Not on file   Number of children: 2   Years of education: Not on file   Highest education level: Not on file  Occupational History   Occupation: retired Radiation protection practitioner  Tobacco Use   Smoking status: Never   Smokeless tobacco: Never  Vaping Use   Vaping Use: Never used  Substance and Sexual Activity   Alcohol use: No   Drug use: No   Sexual activity: Not Currently  Other Topics Concern   Not on file  Social History Narrative   Tobacco use, amount per day now: 0      Past tobacco use, amount per day: 0      How many years did you use tobacco: 0      Alcohol use (drinks per week): 0      Diet:      Do you drink/eat things with caffeine? Yes      Marital status: Married            What year were you married? 1946      Do you live in a house, apartment, assisted living, condo, trailer? Apartment       Is it one or more stories? 1      How many persons live in your home? 2      Do you have any pets in your home? 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 Yes            How often? 3-4 times a week       Do you have a living will? Yes      Do you have a DNR form?            If not, do you want to discuss one?      Do you have signed POA/HPOA forms? Yes             Social Determinants of Health   Financial Resource Strain: Not on file  Food Insecurity: Not on file  Transportation Needs: Not on file  Physical Activity: Not on file  Stress: Not on file  Social Connections: Not on file  Intimate Partner Violence: Not on file    Family History  Problem Relation Age of Onset   Stroke Father    Heart  disease Father    Arthritis Father    Heart disease Mother    Arthritis Mother    Stomach cancer Brother    Stomach cancer Brother    Liver cancer Brother    Breast cancer Sister    Colon cancer Neg Hx    Colon polyps Neg Hx    Esophageal cancer Neg Hx    Rectal cancer Neg Hx     Current Facility-Administered Medications  Medication Dose Route Frequency Provider Last Rate Last Admin   acetaminophen (TYLENOL) suppository 650 mg  650 mg Rectal Q6H PRN Michael Boston, MD       acetaminophen (TYLENOL) tablet 325-650 mg  325-650 mg Oral Q6H PRN Michael Boston, MD   650 mg at 07/30/2021 1852   albumin human 5 % solution 12.5 g  12.5 g Intravenous Q6H PRN Michael Boston, MD       alum & mag hydroxide-simeth (MAALOX/MYLANTA) 200-200-20 MG/5ML suspension 30 mL  30 mL Oral Q6H PRN Michael Boston, MD       bisacodyl (DULCOLAX) suppository 10 mg  10 mg Rectal Q12H PRN Michael Boston, MD       enalaprilat (VASOTEC) injection 0.625-1.25 mg  0.625-1.25 mg Intravenous Q6H PRN Michael Boston, MD       fentaNYL (SUBLIMAZE) injection 25-50 mcg  25-50 mcg Intravenous Q1H PRN Michael Boston, MD   50 mcg at 07/17/21 0322   lip balm (CARMEX) ointment 1 application  1 application Topical BID Michael Boston, MD   1 application at 26/83/41 0141   magic mouthwash  15 mL Oral QID PRN Michael Boston, MD       menthol-cetylpyridinium (CEPACOL) lozenge 3 mg  1 lozenge Oral PRN Michael Boston, MD       methocarbamol (ROBAXIN) 1,000 mg in dextrose 5 % 100 mL IVPB  1,000 mg Intravenous Q6H PRN Michael Boston, MD       metoprolol tartrate (LOPRESSOR) injection 5 mg  5 mg Intravenous Q6H PRN Michael Boston, MD       ondansetron (ZOFRAN) injection 4 mg  4 mg Intravenous Q6H PRN Michael Boston, MD       Or   ondansetron (ZOFRAN) 8 mg in sodium chloride 0.9 % 50 mL IVPB  8 mg Intravenous Q6H PRN Michael Boston, MD       pantoprazole (PROTONIX) injection 40 mg  40 mg Intravenous Q24H Wendee Beavers T, MD   40 mg at 08/10/2021 1949   phenol  (CHLORASEPTIC) mouth spray 2 spray  2 spray Mouth/Throat PRN Michael Boston, MD       piperacillin-tazobactam (ZOSYN) IVPB 3.375 g  3.375 g Intravenous Tor Netters, MD 12.5 mL/hr at 07/17/21 0141 3.375 g at 07/17/21 0141   prochlorperazine (COMPAZINE) injection 5-10 mg  5-10 mg Intravenous Q4H PRN Michael Boston, MD       simethicone (MYLICON) 40 DQ/2.2WL suspension 80 mg  80 mg Oral QID PRN Michael Boston, MD         Allergies  Allergen Reactions   Hydrocodone Other (See Comments)    Dizziness & jerking   Alendronate Other (See Comments)    Can not tolerate   Celebrex [Celecoxib] Other (See Comments)    Can not tolerate   Meloxicam Other (See Comments)    Avoid    Myrbetriq [Mirabegron] Other (See Comments)    Can  not tolerate   Pradaxa [Dabigatran Etexilate Mesylate] Other (See Comments)    Can not tolerate   Toviaz [Fesoterodine Fumarate Er] Other (See Comments)    Can not tolerate   Vesicare [Solifenacin] Other (See Comments)    Can not tolerate    ROS:   Limited by patient being very poor historian due to dementia all other systems reviewed & are negative except per HPI or as noted below: Constitutional:  No fevers, chills, sweats.  Weight stable Eyes:  No vision changes, No discharge HENT:  No sore throats, nasal drainage Lymph: No neck swelling, No bruising easily Pulmonary:  No cough, productive sputum CV: No orthopnea, PND  Patient walks with a walker at assisted living facility.  No exertional chest/neck/shoulder/arm pain. GI:  No personal nor family history of GI/colon cancer, inflammatory bowel disease, irritable bowel syndrome, allergy such as Celiac Sprue, dietary/dairy problems, colitis, ulcers nor gastritis.  No recent sick contacts/gastroenteritis.  No travel outside the country.  No changes in diet. Renal: No UTIs, No hematuria Genital:  No drainage, bleeding, masses Musculoskeletal: No severe joint pain.  Good ROM major joints Skin:  No sores or lesions.   No rashes Heme/Lymph:  No easy bleeding.  No swollen lymph nodes Neuro: No focal weakness/numbness.  No seizures Psych: No suicidal ideation.  No hallucinations  BP 128/69 (BP Location: Left Arm)    Pulse (!) 107    Temp 98.8 F (37.1 C) (Oral)    Resp 16    Ht 5' (1.524 m)    Wt 59.4 kg    SpO2 (!) 89%    BMI 25.58 kg/m   Physical Exam:  Constitutional: Not cachectic.  Hygeine adequate.  Vitals signs as above.   Eyes: Pupils reactive, normal extraocular movements. Sclera nonicteric Neuro: CN II-XII intact.  No major focal sensory defects.  No major motor deficits. Lymph: No head/neck/groin lymphadenopathy Psych:  No severe agitation.  No severe anxiety.  Judgment & insight Impaired, Oriented x1, HENT: Normocephalic, Mucus membranes moist.  No thrush.   Neck: Supple, No tracheal deviation.  No obvious thyromegaly Chest: No pain to chest wall compression.  Good respiratory excursion.  No audible wheezing CV:  Pulses intact.  Irregular rhythm.  No major extremity edema  Abdomen:  Flat Hernia: Not present. Diastasis recti: Not present. Soft.   Mildly distended.   Mild lower abdominal discomfort.  No frank guarding or major peritonitis .  No hepatomegaly.  No splenomegaly  Gen:  Inguinal hernia: Not present.  Inguinal lymph nodes: without lymphadenopathy.    Rectal: (Deferred)  Ext: No obvious deformity or contracture.  Edema: Not present.  No cyanosis Skin: No major subcutaneous nodules.  Warm and dry Musculoskeletal: Severe joint rigidity not present.  No obvious clubbing.  No digital petechiae.     Results:   Labs: Results for orders placed or performed during the hospital encounter of 07/31/2021 (from the past 48 hour(s))  CBC with Differential     Status: Abnormal   Collection Time: 07/15/2021  3:25 PM  Result Value Ref Range   WBC 9.9 4.0 - 10.5 K/uL   RBC 3.38 (L) 3.87 - 5.11 MIL/uL   Hemoglobin 10.2 (L) 12.0 - 15.0 g/dL   HCT 33.2 (L) 36.0 - 46.0 %   MCV 98.2 80.0 - 100.0  fL   MCH 30.2 26.0 - 34.0 pg   MCHC 30.7 30.0 - 36.0 g/dL   RDW 15.8 (H) 11.5 - 15.5 %   Platelets 160 150 -  400 K/uL   nRBC 0.0 0.0 - 0.2 %   Neutrophils Relative % 75 %   Neutro Abs 7.5 1.7 - 7.7 K/uL   Lymphocytes Relative 14 %   Lymphs Abs 1.4 0.7 - 4.0 K/uL   Monocytes Relative 8 %   Monocytes Absolute 0.8 0.1 - 1.0 K/uL   Eosinophils Relative 1 %   Eosinophils Absolute 0.1 0.0 - 0.5 K/uL   Basophils Relative 1 %   Basophils Absolute 0.1 0.0 - 0.1 K/uL   Immature Granulocytes 1 %   Abs Immature Granulocytes 0.11 (H) 0.00 - 0.07 K/uL    Comment: Performed at [facility], [place] [address] [place], [place] [address]  Comprehensive metabolic panel     Status: Abnormal   Collection Time: 08/04/2021  3:25 PM  Result Value Ref Range   Sodium 141 135 - 145 mmol/L   Potassium 3.6 3.5 - 5.1 mmol/L   Chloride 105 98 - 111 mmol/L   CO2 27 22 - 32 mmol/L   Glucose, Bld 150 (H) 70 - 99 mg/dL    Comment: Glucose reference range applies only to samples taken after fasting for at least 8 hours.   BUN 24 (H) 8 - 23 mg/dL   Creatinine, Ser 1.02 (H) 0.44 - 1.00 mg/dL   Calcium 9.1 8.9 - 10.3 mg/dL   Total Protein 6.0 (L) 6.5 - 8.1 g/dL   Albumin 3.2 (L) 3.5 - 5.0 g/dL   AST 13 (L) 15 - 41 U/L   ALT 8 0 - 44 U/L   Alkaline Phosphatase 79 38 - 126 U/L   Total Bilirubin 0.4 0.3 - 1.2 mg/dL   GFR, Estimated 50 (L) >60 mL/min    Comment: (NOTE) Calculated using the CKD-EPI Creatinine Equation (2021)    Anion gap 9 5 - 15    Comment: Performed at [facility], [place] [address], [place], [place] [address]  Lipase, blood     Status: None   Collection Time: 07/21/2021  3:25 PM  Result Value Ref Range   Lipase 44 11 - 51 U/L    Comment: Performed at [facility], [place] [address], [place], [place] [address]  Protime-INR     Status: Abnormal   Collection Time: 07/18/2021  3:25 PM  Result Value Ref Range   Prothrombin Time 26.8 (H) 11.4 - 15.2  seconds   INR 2.5 (H) 0.8 - 1.2    Comment: (NOTE) INR goal varies based on device and disease states. Performed at [facility], [place] [address], [place], [place] [address]   Resp Panel by RT-PCR (Flu A&B, Covid) Nasopharyngeal Swab     Status: None   Collection Time: 07/15/2021  3:26 PM   Specimen: Nasopharyngeal Swab; Nasopharyngeal(NP) swabs in vial transport medium  Result Value Ref Range   SARS Coronavirus 2 by RT PCR NEGATIVE NEGATIVE    Comment: (NOTE) SARS-CoV-2 target nucleic acids are NOT DETECTED.  The SARS-CoV-2 RNA is generally detectable in upper respiratory specimens during the acute phase of infection. The lowest concentration of SARS-CoV-2 viral copies this assay can detect is 138 copies/mL. A negative result does not preclude SARS-Cov-2 infection and should not be used as the sole basis for treatment or other patient management decisions. A negative result may occur with  improper specimen collection/handling, submission of specimen other than nasopharyngeal swab, presence of viral mutation(s) within the areas targeted by this assay, and inadequate number of viral copies(<138 copies/mL). A negative  result must be combined with clinical observations, patient history, and epidemiological information. The expected result is Negative.  Fact Sheet for Patients:  EntrepreneurPulse.com.au  Fact Sheet for Healthcare Providers:  IncredibleEmployment.be  This test is no t yet approved or cleared by the [place] FDA and  has been authorized for detection and/or diagnosis of SARS-CoV-2 by FDA under an Emergency Use Authorization (EUA). This EUA will remain  in effect (meaning this test can be used) for the duration of the COVID-19 declaration under Section 564(b)(1) of the Act, 21 U.S.C.section 360bbb-3(b)(1), unless the authorization is terminated  or revoked sooner.       Influenza A by PCR NEGATIVE NEGATIVE    Influenza B by PCR NEGATIVE NEGATIVE    Comment: (NOTE) The Xpert Xpress SARS-CoV-2/FLU/RSV plus assay is intended as an aid in the diagnosis of influenza from Nasopharyngeal swab specimens and should not be used as a sole basis for treatment. Nasal washings and aspirates are unacceptable for Xpert Xpress SARS-CoV-2/FLU/RSV testing.  Fact Sheet for Patients: EntrepreneurPulse.com.au  Fact Sheet for Healthcare Providers: IncredibleEmployment.be  This test is not yet approved or cleared by the [place] FDA and has been authorized for detection and/or diagnosis of SARS-CoV-2 by FDA under an Emergency Use Authorization (EUA). This EUA will remain in effect (meaning this test can be used) for the duration of the COVID-19 declaration under Section 564(b)(1) of the Act, 21 U.S.C. section 360bbb-3(b)(1), unless the authorization is terminated or revoked.  Performed at [facility], [place] [address] [place], [place] [address]   Urinalysis, Routine w reflex microscopic Urine, Clean Catch     Status: Abnormal   Collection Time: 07/29/2021  4:13 PM  Result Value Ref Range   Color, Urine YELLOW YELLOW   APPearance HAZY (A) CLEAR   Specific Gravity, Urine 1.023 1.005 - 1.030   pH 5.0 5.0 - 8.0   Glucose, UA NEGATIVE NEGATIVE mg/dL   Hgb urine dipstick NEGATIVE NEGATIVE   Bilirubin Urine NEGATIVE NEGATIVE   Ketones, ur 5 (A) NEGATIVE mg/dL   Protein, ur NEGATIVE NEGATIVE mg/dL   Nitrite NEGATIVE NEGATIVE   Leukocytes,Ua MODERATE (A) NEGATIVE   RBC / HPF 0-5 0 - 5 RBC/hpf   WBC, UA >50 (H) 0 - 5 WBC/hpf   Bacteria, UA RARE (A) NONE SEEN   Squamous Epithelial / LPF 0-5 0 - 5   Mucus PRESENT    Hyaline Casts, UA PRESENT    Non Squamous Epithelial 0-5 (A) NONE SEEN    Comment: Performed at [facility], [place] [address] [place], [place] [address]  Prealbumin     Status: Abnormal   Collection Time: 07/23/2021  6:24  PM  Result Value Ref Range   Prealbumin 13.2 (L) 18 - 38 mg/dL    Comment: Performed at [place] [address], [place], [place] [address]  Vitamin B12     Status: None   Collection Time: 08/07/2021  6:24 PM  Result Value Ref Range   Vitamin B-12 201 180 - 914 pg/mL    Comment: (NOTE) This assay is not validated for testing neonatal or myeloproliferative syndrome specimens for Vitamin B12 levels. Performed at [facility], [place] [address], [place], [place] [address]   Folate     Status: None   Collection Time: 08/04/2021  6:24 PM  Result Value Ref Range   Folate 12.4 >5.9 ng/mL    Comment: Performed at [facility], [place]  [address] [place], [place] [address]  Iron and TIBC     Status: Abnormal   Collection Time: 08/07/2021  6:24 PM  Result Value Ref Range   Iron 16 (L) 28 - 170 ug/dL   TIBC 290 250 - 450 ug/dL   Saturation Ratios 6 (L) 10.4 - 31.8 %   UIBC 274 ug/dL    Comment: Performed at [facility], [place] [address], [place], [place] [address]  Ferritin     Status: None   Collection Time: 07/15/2021  6:24 PM  Result Value Ref Range   Ferritin 52 11 - 307 ng/mL    Comment: Performed at [place] [address], [place], [place] [address]  Troponin I (High Sensitivity)     Status: None   Collection Time: 08/08/2021  6:24 PM  Result Value Ref Range   Troponin I (High Sensitivity) 13 <18 ng/L    Comment: (NOTE) Elevated high sensitivity troponin I (hsTnI) values and significant  changes across serial measurements may suggest ACS but many other  chronic and acute conditions are known to elevate hsTnI results.  Refer to the "Links" section for chest pain algorithms and additional  guidance. Performed at [facility], [place] [address] [place], [place] [address]   Reticulocytes     Status: Abnormal   Collection Time: 08/01/2021  6:25 PM  Result Value Ref Range   Retic Ct Pct 2.0 0.4 - 3.1 %    RBC. 3.35 (L) 3.87 - 5.11 MIL/uL   Retic Count, Absolute 65.3 19.0 - 186.0 K/uL   Immature Retic Fract 23.6 (H) 2.3 - 15.9 %    Comment: Performed at [facility], [place] [address], [place], [place] [address]  Troponin I (High Sensitivity)     Status: None   Collection Time: 07/20/2021  8:07 PM  Result Value Ref Range   Troponin I (High Sensitivity) 15 <18 ng/L    Comment: (NOTE) Elevated high sensitivity troponin I (hsTnI) values and significant  changes across serial measurements may suggest ACS but many other  chronic and acute conditions are known to elevate hsTnI results.  Refer to the "Links" section for chest pain algorithms and additional  guidance. Performed at [facility], [place] [address], [place], [place] [address]   CBC     Status: Abnormal   Collection Time: 07/17/21  4:05 AM  Result Value Ref Range   WBC 10.2 4.0 - 10.5 K/uL   RBC 3.36 (L) 3.87 - 5.11 MIL/uL   Hemoglobin 10.3 (L) 12.0 - 15.0 g/dL   HCT 33.1 (L) 36.0 - 46.0 %   MCV 98.5 80.0 - 100.0 fL   MCH 30.7 26.0 - 34.0 pg   MCHC 31.1 30.0 - 36.0 g/dL   RDW 15.9 (H) 11.5 - 15.5 %   Platelets 163 150 - 400 K/uL   nRBC 0.0 0.0 - 0.2 %    Comment: Performed at [facility], [place] [address], [place], [place] [address]  Creatinine, serum     Status: None   Collection Time: 07/17/21  4:05 AM  Result Value Ref Range   Creatinine, Ser 0.58 0.44 - 1.00 mg/dL   GFR, Estimated >60 >60 mL/min    Comment: (NOTE) Calculated using the CKD-EPI Creatinine Equation (2021) Performed at [facility], [place] [address], [place], [place] [address]   Protime-INR     Status: Abnormal   Collection Time: 07/17/21  4:05 AM  Result Value  Ref Range   Prothrombin Time 25.3 (H) 11.4 - 15.2 seconds   INR 2.3 (H) 0.8 - 1.2    Comment: (NOTE) INR goal varies based on device and disease states. Performed at [facility], [place] [address], [place], [place] [address]   Renal function panel     Status: Abnormal   Collection Time: 07/17/21  4:05 AM  Result Value Ref Range   Sodium 139 135 - 145 mmol/L   Potassium 3.7 3.5 - 5.1 mmol/L   Chloride 107 98 - 111 mmol/L   CO2 25 22 - 32 mmol/L   Glucose, Bld 121 (H) 70 - 99 mg/dL    Comment: Glucose reference range applies only to samples taken after fasting for at least 8 hours.   BUN 14 8 - 23 mg/dL   Creatinine, Ser 0.58 0.44 - 1.00 mg/dL   Calcium 8.6 (L) 8.9 - 10.3 mg/dL   Phosphorus 2.3 (L) 2.5 - 4.6 mg/dL   Albumin 3.1 (L) 3.5 - 5.0 g/dL   GFR, Estimated >60 >60 mL/min    Comment: (NOTE) Calculated using the CKD-EPI Creatinine Equation (2021)    Anion gap 7 5 - 15    Comment: Performed at [facility], [place] [address] [place], [place] [address]  Magnesium     Status: None   Collection Time: 07/17/21  4:05 AM  Result Value Ref Range   Magnesium 2.2 1.7 - 2.4 mg/dL    Comment: Performed at [facility], [place] [address] [place], [place] [address]    Imaging / Studies: CT ABDOMEN PELVIS W CONTRAST  Result Date: 08/10/2021 CLINICAL DATA:  Lower abdominal pain. EXAM: CT ABDOMEN AND PELVIS WITH CONTRAST TECHNIQUE: Multidetector CT imaging of the abdomen and pelvis was performed using the standard protocol following bolus administration of intravenous contrast. RADIATION DOSE REDUCTION: This exam was performed according to the departmental dose-optimization program which includes automated exposure control, adjustment of the mA and/or kV according to patient size and/or use of iterative reconstruction technique. CONTRAST:  140m OMNIPAQUE IOHEXOL 300 MG/ML  SOLN COMPARISON:  09/29/2018 FINDINGS: Lower chest: No acute abnormality. Cardiomegaly. Aortic valve calcifications. Coronary artery calcifications. Scarring and or fibrosis of the included bilateral lung bases. Hepatobiliary: No focal liver abnormality is seen. Status post  cholecystectomy. No biliary dilatation. Pancreas: Unremarkable. No pancreatic ductal dilatation or surrounding inflammatory changes. Spleen: Normal in size without significant abnormality. Adrenals/Urinary Tract: Adrenal glands are unremarkable. Kidneys are normal, without renal calculi, solid lesion, or hydronephrosis. Bladder is unremarkable. Stomach/Bowel: Stomach is within normal limits. The appendix is fluid-filled, slightly hyperenhancing, and nondilated, however there is extensive adjacent fat stranding and small locules of extraluminal air (series 2, image 51). No evidence of bowel wall thickening, distention, or inflammatory changes. Vascular/Lymphatic: Aortic atherosclerosis. No enlarged abdominal or pelvic lymph nodes. Reproductive: Status post hysterectomy. Other: No abdominal wall hernia or abnormality. No ascites. Musculoskeletal: No acute or significant osseous findings. IMPRESSION: 1. The appendix is fluid-filled, slightly hyperenhancing, and nondilated, however there is extensive adjacent fat stranding and small locules of extraluminal air. Findings are consistent with acute appendicitis complicated by micro perforation. 2. Cardiomegaly and coronary artery disease. These results were called by telephone at the time of interpretation on 07/23/2021 at 5:01 pm to PMonroe, who verbally acknowledged these results. Aortic Atherosclerosis (ICD10-I70.0). Electronically Signed   By: ADelanna AhmadiM.D.   On: 07/18/2021 17:04    Medications / Allergies: per chart  Antibiotics: Anti-infectives (  From admission, onward)    Start     Dose/Rate Route Frequency Ordered Stop   07/17/21 0200  piperacillin-tazobactam (ZOSYN) IVPB 3.375 g        3.375 g 12.5 mL/hr over 240 Minutes Intravenous Every 8 hours 08/06/2021 1733 07/22/21 0159   07/20/2021 1815  piperacillin-tazobactam (ZOSYN) IVPB 3.375 g        3.375 g 100 mL/hr over 30 Minutes Intravenous  Once 07/13/2021 1802 07/13/2021 1902   07/14/2021 1730   metroNIDAZOLE (FLAGYL) IVPB 500 mg  Status:  Discontinued        500 mg 100 mL/hr over 60 Minutes Intravenous  Once 07/29/2021 1725 07/26/2021 1733   08/11/2021 1715  cefTRIAXone (ROCEPHIN) 1 g in sodium chloride 0.9 % 100 mL IVPB  Status:  Discontinued        1 g 200 mL/hr over 30 Minutes Intravenous  Once 07/15/2021 1709 07/13/2021 1725   08/08/2021 1645  cefTRIAXone (ROCEPHIN) 1 g in sodium chloride 0.9 % 100 mL IVPB  Status:  Discontinued        1 g 200 mL/hr over 30 Minutes Intravenous  Once 07/14/2021 1640 08/01/2021 1730         Note: Portions of this report may have been transcribed using voice recognition software. Every effort was made to ensure accuracy; however, inadvertent computerized transcription errors may be present.   Any transcriptional errors that result from this process are unintentional.    Adin Hector, MD, FACS, MASCRS Esophageal, Gastrointestinal & Colorectal Surgery Robotic and Minimally Invasive Surgery  [facility], [place]  [place] [place], [place] [address] [phone] Fax [phone] Main  CONTACT INFORMATION:  Weekday (9AM-5PM): Call [facility] main office at [phone]  Weeknight (5PM-9AM) or Weekend/Holiday: Check [email or web address] (password " [email or web address]") for General Surgery [facility] coverage  (Please, do not use SecureChat as it is not reliable communication to operating surgeons for immediate patient care)       07/17/2021  6:04 AM

## 2021-07-17 NOTE — Progress Notes (Signed)
Echocardiogram ?2D Echocardiogram has been performed. ? ?[name] ?07/17/2021, 1:58 PM ?

## 2021-07-17 NOTE — TOC Initial Note (Signed)
Transition of Care (TOC) - Initial/Assessment Note  ? ? ?Patient Details  ?Name: [name] ?MRN: [id number] ?Date of Birth: 31-Mar-1923 ? ?Transition of Care (TOC) CM/SW Contact:    ?Reyah Streeter, LCSW ?Phone Number: ?07/17/2021, 3:32 PM ? ?Clinical Narrative:                 ?Met with pt and spoke with daughter, Hassan Rowan (via phone) to introduce self/ TOC role.  Both confirming that pt is a resident in the ALF at [facility] (x 2 yrs) and they are hopeful she can return.  They are agreeable to [facility] assistance with coordinating this return when she is medically ready.   ?Spoke with the DON, Amber Wisor today at [facility] who is in agreement to pt's return and confirms her baseline function is mobile with rollator.  Will continue to monitor progress towards dc. ? ?Expected Discharge Plan: Assisted Living ?Barriers to Discharge: Continued Medical Work up ? ? ?Patient Goals and CMS Choice ?Patient states their goals for this hospitalization and ongoing recovery are:: return to AL apt ?  ?  ? ?Expected Discharge Plan and Services ?Expected Discharge Plan: Assisted Living ?In-house Referral: Clinical Social Work ?  ?  ?Living arrangements for the past 2 months: [place] ([place]) ?                ?  ?  ?  ?  ?  ?  ?  ?  ?  ?  ? ?Prior Living Arrangements/Services ?Living arrangements for the past 2 months: [place] ([place]) ?Lives with:: Facility Resident ?Patient language and need for interpreter reviewed:: Yes ?       ?Need for Family Participation in Patient Care: No (Comment) ?Care giver support system in place?: Yes (comment) ?  ?Criminal Activity/Legal Involvement Pertinent to Current Situation/Hospitalization: No - Comment as needed ? ?Activities of Daily Living ?Home Assistive Devices/Equipment: Bedside commode/3-in-1, Grab bars around toilet, Grab bars in shower, Hearing aid ?ADL Screening (condition at time of admission) ?Patient's cognitive ability adequate  to safely complete daily activities?: Yes ?Is the patient deaf or have difficulty hearing?: Yes ?Does the patient have difficulty seeing, even when wearing glasses/contacts?: No ?Does the patient have difficulty concentrating, remembering, or making decisions?: Yes ?Patient able to express need for assistance with ADLs?: Yes ?Does the patient have difficulty dressing or bathing?: Yes ?Independently performs ADLs?: No ?Communication: Independent ?Dressing (OT): Needs assistance ?Is this a change from baseline?: Pre-admission baseline ?Grooming: Needs assistance ?Is this a change from baseline?: Pre-admission baseline ?Feeding: Independent ?Bathing: Needs assistance ?Is this a change from baseline?: Pre-admission baseline ?Toileting: Needs assistance ?Is this a change from baseline?: Pre-admission baseline ?In/Out Bed: Needs assistance ?Does the patient have difficulty walking or climbing stairs?: Yes ?Weakness of Legs: Both ?Weakness of Arms/Hands: Both ? ?Permission Sought/Granted ?Permission sought to share information with : Family Supports ?Permission granted to share information with : Yes, Verbal Permission Granted ? Share Information with NAME: [name] ?   ? Permission granted to share info w Relationship: daughter ? Permission granted to share info w Contact Information: [phone] ? ?Emotional Assessment ?Appearance:: Appears stated age ?Attitude/Demeanor/Rapport: Gracious ?Affect (typically observed): Accepting ?Orientation: : Oriented to Self, Oriented to Place, Oriented to  Time, Oriented to Situation ?Alcohol / Substance Use: Not Applicable ?Psych Involvement: No (comment) ? ?Admission diagnosis:  Acute appendicitis [K35.80] ?Acute appendicitis with perforation and localized peritonitis, without abscess or gangrene [K35.32] ?Patient Active  Problem List  ? Diagnosis Date Noted  ? Protein-calorie malnutrition, moderate ([place]) 07/17/2021  ? Chronic kidney disease, stage 3a ([place]) 07/27/2021  ? Acute  perforated appendicitis 08/04/2021  ? Uses roller walker 07/14/2021  ? Anticoagulated on warfarin 08/09/2021  ? Dementia ([place]) 08/07/2021  ? Arthritis 08/11/2021  ? Hypertension 08/09/2021  ? History of recurrent UTIs 07/22/2021  ? Anemia of chronic disease 07/18/2021  ? Hiatal hernia 07/15/2021  ? Heart murmur 07/18/2021  ? Iron deficiency anemia 08/02/2021  ? Left shoulder pain 07/15/2021  ? Slow transit constipation 08/01/2020  ? Depression, recurrent ([place]) 07/05/2020  ? Pneumonia due to COVID-19 virus 06/02/2020  ? Chronic diastolic CHF (congestive heart failure) ([place]) 06/02/2020  ? Age related osteoporosis 04/05/2020  ? GERD (gastroesophageal reflux disease) 04/05/2020  ? Mixed incontinence urge and stress 01/28/2019  ? Acute gingivitis, plaque induced 10/15/2018  ? Hyperlipidemia 10/15/2018  ? History of DVT (deep vein thrombosis) 09/02/2017  ? Involuntary movements 09/02/2017  ? Hypothyroidism 09/02/2017  ? Cognitive impairment 09/02/2017  ? Myoclonic jerking 07/12/2016  ? Third degree AV block S/p PPM 03/11/2012  ? Edema of both legs 09/28/2011  ? Long term (current) use of anticoagulants 05/23/2011  ? Atypical chest pain 02/12/2011  ? Fatigue 02/12/2011  ? Bronchitis, chronic ([place]) 12/15/2010  ? Back pain 11/06/2010  ? PAROXYSMAL ATRIAL FIBRILLATION 07/26/2010  ? DYSPHAGIA 12/14/2008  ? ?PCP:  [name], MD ?Pharmacy:   ?[facility] ?[address] ?[place] [address] ?Phone: [phone] Fax: [phone] ? ? ? ? ?Social Determinants of Health (SDOH) Interventions ?  ? ?Readmission Risk Interventions ?No flowsheet data found. ? ? ?

## 2021-07-17 NOTE — Evaluation (Signed)
Physical Therapy Evaluation ?Patient Details ?Name: [name] ?MRN: [id number] ?DOB: [birth year] ?Today's Date: 07/17/2021 ? ?History of Present Illness ? Patient is a 88 year old female who presented with R lower quadrant pain. patient was found to have acute preforated appendicitis. PMH: dementia, CHF, pacemaker, myoclonic jerking, COVID 19, PE , DVT  ?Clinical Impression ? Pt is a 88 y.o. female with above HPI resulting in the deficits listed below (see PT Problem List). Pt required MIN A and use of bed rails with supine to sit. Pt performed sit to stand transfers with MIN guard for safety and cues for safe hand placement. Pt ambulated total of ~16f with MIN guard progressing to supervision for safety. Anticipate pt will continue to progress with mobility/improve activity tolerance with decreased pain levels and anticipate no PT needs upon d/c. Pt will benefit from skilled PT to maximize functional mobility and increase independence.  ?   ?   ? ?Recommendations for follow up therapy are one component of a multi-disciplinary discharge planning process, led by the attending physician.  Recommendations may be updated based on patient status, additional functional criteria and insurance authorization. ? ?Follow Up Recommendations No PT follow up ? ?  ?Assistance Recommended at Discharge Intermittent Supervision/Assistance  ?Patient can return home with the following ? A little help with walking and/or transfers;A little help with bathing/dressing/bathroom;Assistance with cooking/housework;Assist for transportation;Help with stairs or ramp for entrance ? ?  ?Equipment Recommendations None recommended by PT (pt owns rollator)  ?Recommendations for Other Services ?    ?  ?Functional Status Assessment Patient has had a recent decline in their functional status and demonstrates the ability to make significant improvements in function in a reasonable and predictable amount of time.  ? ?  ?Precautions / Restrictions  Precautions ?Precautions: Fall ?Restrictions ?Weight Bearing Restrictions: No  ? ?  ? ?Mobility ? Bed Mobility ?Overal bed mobility: Needs Assistance ?Bed Mobility: Supine to Sit ?  ?  ?Supine to sit: Min assist, HOB elevated ?  ?  ?General bed mobility comments: MIN A with use of bed rails, increased time due to pain. HHA to bring trunk to upright ?  ? ?Transfers ?Overall transfer level: Needs assistance ?Equipment used: Rolling walker (2 wheels) ?Transfers: Sit to/from Stand ?Sit to Stand: Supervision ?  ?  ?  ?  ?  ?General transfer comment: supervision for safety, increased time to come to standing due to pain ?  ? ?Ambulation/Gait ?Ambulation/Gait assistance: Min guard, Supervision ?Gait Distance (Feet): 16 Feet ?Assistive device: Rolling walker (2 wheels) ?Gait Pattern/deviations: Step-through pattern, Decreased stride length ?Gait velocity: decr ?  ?  ?General Gait Details: cues to maintain close proximity to RW, no overt LOB observed. Increased pain with ambulation ? ?Stairs ?  ?  ?  ?  ?  ? ?Wheelchair Mobility ?  ? ?Modified Rankin (Stroke Patients Only) ?  ? ?  ? ?Balance Overall balance assessment: Needs assistance ?Sitting-balance support: Feet supported ?Sitting balance-Leahy Scale: Fair ?  ?  ?Standing balance support: Bilateral upper extremity supported, During functional activity ?Standing balance-Leahy Scale: Fair ?Standing balance comment: able to stand statically with supervision for safety ?  ?  ?  ?  ?  ?  ?  ?  ?  ?  ?  ?   ? ? ? ?Pertinent Vitals/Pain Pain Assessment ?Pain Assessment: Faces ?Faces Pain Scale: Hurts whole lot ?Pain Location: R LQ abdomen ?Pain Descriptors / Indicators: Sore, Discomfort ?Pain Intervention(s): Limited activity within patient's  tolerance, Monitored during session, RN gave pain meds during session  ? ? ?Home Living Family/patient expects to be discharged to:: Assisted living ?  ?  ?  ?  ?  ?  ?  ?  ?Home Equipment: Rollator (4 wheels) ?Additional Comments: Pt  daughter present to assist with PLOF. [facility]yr. Daughter has BSCand tub bench at home pt has access to.  ?  ?Prior Function Prior Level of Function : Needs assist;History of Falls (last six months) ?  ?  ?  ?Physical Assist : ADLs (physical) ?  ?ADLs (physical): Bathing ?Mobility Comments: MOD I with rollator at baseline ?ADLs Comments: assist with bathing, able to "manage" with getting dressed ?  ? ? ?Hand Dominance  ?   ? ?  ?Extremity/Trunk Assessment  ? Upper Extremity Assessment ?Upper Extremity Assessment: Defer to OT evaluation ?  ? ?Lower Extremity Assessment ?Lower Extremity Assessment: Generalized weakness ?  ? ?Cervical / Trunk Assessment ?Cervical / Trunk Assessment: Normal  ?Communication  ? Communication: HOH (has hearing aids)  ?Cognition Arousal/Alertness: Awake/alert ?Behavior During Therapy: [facility] Centerfor tasks assessed/performed ?Overall Cognitive Status: Within Functional Limits for tasks assessed ?  ?  ?  ?  ?  ?  ?  ?  ?  ?  ?  ?  ?  ?  ?  ?  ?General Comments: A&) x4, able to answer questions appropriately, soem repetition required- suspect due to hearing impairments ?  ?  ? ?  ?General Comments General comments (skin integrity, edema, etc.): Pt on 2L upon entry. O2 sats >90% on RA. ? ?  ?Exercises    ? ?Assessment/Plan  ?  ?PT Assessment Patient needs continued PT services  ?PT Problem List Decreased strength;Decreased activity tolerance;Decreased balance;Decreased mobility;Pain;Decreased knowledge of use of DME ? ?   ?  ?PT Treatment Interventions DME instruction;Gait training;Functional mobility training;Therapeutic activities;Therapeutic exercise;Balance training;Patient/family education   ? ?PT Goals (Current goals can be found in the Care Plan section)  ?Acute Rehab PT Goals ?Patient Stated Goal: less pain and go back to ALF ?PT Goal Formulation: With patient/family ?Time For Goal Achievement: 07/31/21 ?Potential to Achieve Goals: Good ? ?  ?Frequency Min  3X/week ?  ? ? ?Co-evaluation   ?  ?  ?  ?  ? ? ?  ?AM-PAC PT "6 Clicks" Mobility  ?Outcome Measure Help needed turning from your back to your side while in a flat bed without using bedrails?: A Little ?Help needed moving from lying on your back to sitting on the side of a flat bed without using bedrails?: A Lot ?Help needed moving to and from a bed to a chair (including a wheelchair)?: A Little ?Help needed standing up from a chair using your arms (e.g., wheelchair or bedside chair)?: A Little ?Help needed to walk in hospital room?: A Little ?Help needed climbing 3-5 steps with a railing? : A Lot ?6 Click Score: 16 ? ?  ?End of Session Equipment Utilized During Treatment: Gait belt ?Activity Tolerance: Patient tolerated treatment well;Patient limited by pain ?Patient left: in chair;with call bell/phone within reach;with nursing/sitter in room;with family/visitor present (handoff to OT) ?Nurse Communication: Mobility status ?PT Visit Diagnosis: Unsteadiness on feet (R26.81);Other abnormalities of gait and mobility (R26.89);Pain ?Pain - Right/Left: Right ?Pain - part of body:  (abdomen) ?  ? ?Time: [address]?PT Time Calculation (min) (ACUTE ONLY): 24 min ? ? ?Charges:   PT Evaluation ?$PT Eval Low Complexity: 1 Low ?PT Treatments ?$  Therapeutic Activity: 8-22 mins ?  ?   ? ? ?Festus Barren., PT, DPT  ?Acute Rehabilitation Services  ?Office [phone] ? ?07/17/2021, 4:34 PM ? ?

## 2021-07-17 NOTE — Hospital Course (Addendum)
88 y.o. female with PMH of dementia, diastolic CHF, A-fib/DVT on warfarin, AVB/PPM, CKD-3A, anemia and hypothyroidism presenting with bilateral lower quadrant pain, right> left for about a week that has gotten worse 24 hours prior to presentation, and admitted for acute perforated but contained appendicitis.  General surgery consulted.  Started on IV Zosyn. ? ?Patient's abdominal pain seems to be worsening.  Now with more distention.  TTE with LVEF of 50%, severe aortic valve stenosis and moderate mitral valve stenosis.  Patient is high risk surgery.  General surgery discussed treatment options with patient's daughter who is to discuss with the rest of the family before making decision about pursuing surgery or changing to comfort care.  Palliative care consulted. ?3/9, currently awaiting bed placement at [facility].  Continue comfort care. ?3/10 started on morphine infusion due to ongoing severe abdominal pain despite scheduled roxanol. Now anticipating hospital death.  ?

## 2021-07-17 NOTE — Evaluation (Signed)
Occupational Therapy Evaluation Patient Details Name: [name] MRN: [id number] DOB: 10-22-1922 Today's Date: 07/17/2021   History of Present Illness Patient is a 88 year old female who presented with R lower quadrant pain. patient was found to have acute preforated appendicitis. PMH: dementia, CHF, pacemaker, myoclonic jerking, COVID 55, PE , DVT   Clinical Impression   Patient is a 88 year old female who was noted to have had a decline in the ability to participate in ADLs. Patients pain is most limiting factor towards being able to engage in ADLs and time out of bed at this time. Patient would be most successful with 24/7 caregiver support in next level of care.  Patient would continue to benefit from skilled OT services at this time while admitted and after d/c to address noted deficits in order to improve overall safety and independence in ADLs.        Recommendations for follow up therapy are one component of a multi-disciplinary discharge planning process, led by the attending physician.  Recommendations may be updated based on patient status, additional functional criteria and insurance authorization.   Follow Up Recommendations  Home health OT    Assistance Recommended at Discharge Frequent or constant Supervision/Assistance  Patient can return home with the following A little help with walking and/or transfers;A little help with bathing/dressing/bathroom;Assistance with cooking/housework;Direct supervision/assist for financial management;Assist for transportation;Help with stairs or ramp for entrance;Direct supervision/assist for medications management    Functional Status Assessment  Patient has had a recent decline in their functional status and demonstrates the ability to make significant improvements in function in a reasonable and predictable amount of time.  Equipment Recommendations  None recommended by OT    Recommendations for Other Services        Precautions / Restrictions Precautions Precautions: Fall Restrictions Weight Bearing Restrictions: No      Mobility Bed Mobility Overal bed mobility: Needs Assistance Bed Mobility: Sit to Supine       Sit to supine: Mod assist   General bed mobility comments: with increased time and physical assist for BLE to get into bed.    Transfers                          Balance Overall balance assessment: Needs assistance Sitting-balance support: Feet supported Sitting balance-Leahy Scale: Fair     Standing balance support: Bilateral upper extremity supported, During functional activity Standing balance-Leahy Scale: Fair                             ADL either performed or assessed with clinical judgement   ADL Overall ADL's : Needs assistance/impaired Eating/Feeding: Set up;Sitting Eating/Feeding Details (indicate cue type and reason): to take sip of water after medications sitting in recliner Grooming: Wash/dry face;Wash/dry hands;Set up;Sitting   Upper Body Bathing: Set up;Sitting   Lower Body Bathing: Maximal assistance;Sit to/from stand Lower Body Bathing Details (indicate cue type and reason): pain impacting ability to reach and complete task Upper Body Dressing : Set up;Sitting   Lower Body Dressing: Maximal assistance;Sit to/from stand Lower Body Dressing Details (indicate cue type and reason): patient attempted to bring LLE into lap to doff sock with patietn able to get leg onto lap but to keep it on lap put too much pressure on R LQ so it was stopped. Toilet Transfer: Minimal assistance;Rolling walker (2 wheels) Toilet Transfer Details (indicate cue type and reason):  to transfer back into bed from recliner with patient unable to find comfortable position in recliner with multiple positioning things attempted. Toileting- Clothing Manipulation and Hygiene: Maximal assistance;Sit to/from stand       Functional mobility during ADLs: Minimal  assistance;Rolling walker (2 wheels) General ADL Comments: patient's daughter was educated on some pain management techniques to attempt with patient later if she needed it. patietns daughter verbalized understanding.     Vision Baseline Vision/History: 1 Wears glasses Patient Visual Report: No change from baseline       Perception     Praxis      Pertinent Vitals/Pain Pain Assessment Pain Assessment: Faces Faces Pain Scale: Hurts whole lot Pain Location: R LQ abdomen Pain Descriptors / Indicators: Sore, Discomfort, Moaning Pain Intervention(s): Limited activity within patient's tolerance, Monitored during session, RN gave pain meds during session     Hand Dominance Right   Extremity/Trunk Assessment Upper Extremity Assessment Upper Extremity Assessment: Overall WFL for tasks assessed (MMT was not tested due to level of pain in R LQ. patient was able to lift BUE to about 90 degrees flexion/abduction without increased pain)   Lower Extremity Assessment Lower Extremity Assessment: Defer to PT evaluation   Cervical / Trunk Assessment Cervical / Trunk Assessment: Normal   Communication Communication Communication: HOH   Cognition Arousal/Alertness: Awake/alert Behavior During Therapy: WFL for tasks assessed/performed Overall Cognitive Status: Within Functional Limits for tasks assessed                                 General Comments: patient was able to answer questions appropriately. patient was noted to have some sequencing issues with asking to remove hearing aids prior to getting back to bed with education to keep in incase she needed cues. patient verbalized understanding.     General Comments  Pt on 2L upon entry. O2 sats >90% on RA.    Exercises     Shoulder Instructions      Home Living Family/patient expects to be discharged to:: Assisted living                             Home Equipment: Rollator (4 wheels)   Additional  Comments: Pt daughter present to assist with PLOF. Pt [facility] at [facility] for ~47yr. Daughter has BSCand tub bench at home pt has access to.      Prior Functioning/Environment Prior Level of Function : Needs assist;History of Falls (last six months)       Physical Assist : ADLs (physical)   ADLs (physical): Bathing Mobility Comments: MOD I with rollator at baseline ADLs Comments: assist with bathing, able to "manage" with getting dressed        OT Problem List: Decreased activity tolerance;Impaired balance (sitting and/or standing);Decreased safety awareness;Pain      OT Treatment/Interventions: Self-care/ADL training;Therapeutic exercise;Neuromuscular education;Energy conservation;DME and/or AE instruction;Therapeutic activities;Balance training;Patient/family education    OT Goals(Current goals can be found in the care plan section) Acute Rehab OT Goals Patient Stated Goal: to get pain under control OT Goal Formulation: With patient/family Time For Goal Achievement: 07/31/21 Potential to Achieve Goals: Good  OT Frequency: Min 2X/week    Co-evaluation              AM-PAC OT "6 Clicks" Daily Activity     Outcome Measure Help from another person eating meals?: A Little (ice only at  this time) Help from another person taking care of personal grooming?: A Little Help from another person toileting, which includes using toliet, bedpan, or urinal?: A Lot Help from another person bathing (including washing, rinsing, drying)?: A Lot Help from another person to put on and taking off regular upper body clothing?: A Little Help from another person to put on and taking off regular lower body clothing?: A Lot 6 Click Score: 15   End of Session Equipment Utilized During Treatment: Rolling walker (2 wheels);Gait belt Nurse Communication: Mobility status;Other (comment) (pain levels)  Activity Tolerance: Patient tolerated treatment well Patient left: in bed;with call  bell/phone within reach;with family/visitor present  OT Visit Diagnosis: Unsteadiness on feet (R26.81);Pain                Time: 8182-9937 OT Time Calculation (min): 26 min Charges:  OT General Charges $OT Visit: 1 Visit OT Evaluation $OT Eval Moderate Complexity: 1 Mod  Jackelyn Poling OTR/L, MS Acute Rehabilitation Department Office# [phone] Pager# [phone]   Marcellina Millin 07/17/2021, 4:40 PM

## 2021-07-17 NOTE — Assessment & Plan Note (Addendum)
Now comfort care 

## 2021-07-18 ENCOUNTER — Inpatient Hospital Stay (HOSPITAL_COMMUNITY): Payer: Medicare Other

## 2021-07-18 DIAGNOSIS — I350 Nonrheumatic aortic (valve) stenosis: Secondary | ICD-10-CM

## 2021-07-18 DIAGNOSIS — Z515 Encounter for palliative care: Secondary | ICD-10-CM | POA: Diagnosis not present

## 2021-07-18 DIAGNOSIS — I5032 Chronic diastolic (congestive) heart failure: Secondary | ICD-10-CM | POA: Diagnosis not present

## 2021-07-18 DIAGNOSIS — R011 Cardiac murmur, unspecified: Secondary | ICD-10-CM | POA: Diagnosis not present

## 2021-07-18 DIAGNOSIS — Z7189 Other specified counseling: Secondary | ICD-10-CM

## 2021-07-18 DIAGNOSIS — M25512 Pain in left shoulder: Secondary | ICD-10-CM | POA: Diagnosis not present

## 2021-07-18 DIAGNOSIS — R8271 Bacteriuria: Secondary | ICD-10-CM

## 2021-07-18 DIAGNOSIS — K3532 Acute appendicitis with perforation and localized peritonitis, without abscess: Secondary | ICD-10-CM | POA: Diagnosis not present

## 2021-07-18 DIAGNOSIS — N390 Urinary tract infection, site not specified: Secondary | ICD-10-CM | POA: Diagnosis present

## 2021-07-18 DIAGNOSIS — B9620 Unspecified Escherichia coli [E. coli] as the cause of diseases classified elsewhere: Secondary | ICD-10-CM | POA: Diagnosis present

## 2021-07-18 LAB — CBC
HCT: 34.2 % — ABNORMAL LOW (ref 36.0–46.0)
Hemoglobin: 10.7 g/dL — ABNORMAL LOW (ref 12.0–15.0)
MCH: 30.9 pg (ref 26.0–34.0)
MCHC: 31.3 g/dL (ref 30.0–36.0)
MCV: 98.8 fL (ref 80.0–100.0)
Platelets: 161 10*3/uL (ref 150–400)
RBC: 3.46 MIL/uL — ABNORMAL LOW (ref 3.87–5.11)
RDW: 15.7 % — ABNORMAL HIGH (ref 11.5–15.5)
WBC: 8.6 10*3/uL (ref 4.0–10.5)
nRBC: 0 % (ref 0.0–0.2)

## 2021-07-18 LAB — RENAL FUNCTION PANEL
Albumin: 2.9 g/dL — ABNORMAL LOW (ref 3.5–5.0)
Anion gap: 6 (ref 5–15)
BUN: 12 mg/dL (ref 8–23)
CO2: 24 mmol/L (ref 22–32)
Calcium: 8.3 mg/dL — ABNORMAL LOW (ref 8.9–10.3)
Chloride: 110 mmol/L (ref 98–111)
Creatinine, Ser: 0.6 mg/dL (ref 0.44–1.00)
GFR, Estimated: >60 mL/min (ref >60)
Glucose, Bld: 139 mg/dL — ABNORMAL HIGH (ref 70–99)
Phosphorus: 1.9 mg/dL — ABNORMAL LOW (ref 2.5–4.6)
Potassium: 3.7 mmol/L (ref 3.5–5.1)
Sodium: 140 mmol/L (ref 135–145)

## 2021-07-18 LAB — URINE CULTURE: Culture: 60000 — AB

## 2021-07-18 LAB — MAGNESIUM: Magnesium: 2.3 mg/dL (ref 1.7–2.4)

## 2021-07-18 LAB — PROTIME-INR
INR: 1.9 — ABNORMAL HIGH (ref 0.8–1.2)
Prothrombin Time: 22 seconds — ABNORMAL HIGH (ref 11.4–15.2)

## 2021-07-18 MED ORDER — ACETAMINOPHEN 325 MG PO TABS
650.0000 mg | ORAL_TABLET | Freq: Three times a day (TID) | ORAL | Status: DC
  Administered 2021-07-18 – 2021-07-20 (×8): 650 mg via ORAL
  Filled 2021-07-18 (×9): qty 2

## 2021-07-18 MED ORDER — HYDROMORPHONE HCL 1 MG/ML IJ SOLN: 0.5000 mg | Freq: Once | INTRAMUSCULAR | Status: DC

## 2021-07-18 MED ORDER — ALPRAZOLAM 0.25 MG PO TABS
0.2500 mg | ORAL_TABLET | Freq: Two times a day (BID) | ORAL | Status: DC | PRN
  Administered 2021-07-18 – 2021-07-19 (×2): 0.25 mg via ORAL
  Filled 2021-07-18 (×2): qty 1

## 2021-07-18 MED ORDER — METOPROLOL TARTRATE 5 MG/5ML IV SOLN
2.5000 mg | Freq: Four times a day (QID) | INTRAVENOUS | Status: DC | PRN
Start: 2021-07-18 — End: 2021-07-19

## 2021-07-18 MED ORDER — GLYCOPYRROLATE 0.2 MG/ML IJ SOLN
0.4000 mg | INTRAMUSCULAR | Status: DC | PRN
  Filled 2021-07-18: qty 2

## 2021-07-18 MED ORDER — POTASSIUM PHOSPHATES 15 MMOLE/5ML IV SOLN
30.0000 mmol | Freq: Once | INTRAVENOUS | Status: AC
  Administered 2021-07-18: 30 mmol via INTRAVENOUS
  Filled 2021-07-18: qty 10

## 2021-07-18 MED ORDER — ACETAMINOPHEN 650 MG RE SUPP
650.0000 mg | Freq: Three times a day (TID) | RECTAL | Status: DC
Start: 2021-07-18 — End: 2021-07-21

## 2021-07-18 NOTE — Progress Notes (Signed)
? ? ? ?  Called to review AXR and decubitus view - free air in abdomen, likely increased.  Patient with increased abdominal pain. ? ?I met with patient and daughter at bedside.  Discussed x-ray findings.  Discussed options of proceeding with surgery (laparoscopic vs open) or continuing medical management with abx's and comfort measures.  Either course is high risk.  Likely ICU admission after surgery, possible ventilator support.  I discussed this with patient and daughter. ? ?They would like to consult with their minister and other family members.  I agreed and will check back with them later today regarding their wishes. ? ?If they elect to proceed with surgery, may ask for urgent cardiology input and would need to reverse anticoagulation with plasma and Vit K. ? ?Await family discussion and decision regarding course of treatment.  My team and hospitalist team made aware. ? ?Armandina Gemma, MD ?[facility] ?A [facility] practice ?Office: [phone] ? ?

## 2021-07-18 NOTE — Assessment & Plan Note (Deleted)
Patient with history of knee replacement now with MSSA bacteremia.  Concern for embolic seeding ?-MRI with/without contrast ?

## 2021-07-18 NOTE — Assessment & Plan Note (Addendum)
Blood pressure stable.  Now comfort care. ?

## 2021-07-18 NOTE — Progress Notes (Signed)
?   07/18/21 1400  ?Mobility  ?Activity Refused mobility  ? ?Pt wants to rest today. ? ?Rudell Cobb. ?Mobility Specialist ?Acute Rehab Services ?Office: [phone] ? ?

## 2021-07-18 NOTE — Assessment & Plan Note (Addendum)
Palliative care consulted. ?Patient is too high risk for surgery. ?With ongoing symptom issues, family decided present to complete comfort. ?Pt overnight with severe pain despite scheduled roxanol.  ?Now on morphine infusion, will anticipate hospital death. ?

## 2021-07-18 NOTE — Assessment & Plan Note (Addendum)
Urine culture with pansensitive E. coli  ?She has chronic incontinence. ?Treated with IV antibiotics.  Now comfort care. ?

## 2021-07-18 NOTE — Progress Notes (Signed)
Subjective: Patient doesn't feel well today.  Abdomen feels more bloated today.  Patient struggles on whether her pain is worse, but she didn't complain of pain yesterday like she does today  ROS: See above, otherwise other systems negative  Objective: Vital signs in last 24 hours: Temp:  [97.7 F (36.5 C)-99.5 F (37.5 C)] 97.7 F (36.5 C) (03/07 5670) Pulse Rate:  [86-107] 107 (03/07 0638) Resp:  [16-18] 16 (03/07 1410) BP: (117-148)/(60-84) 148/84 (03/07 3013) SpO2:  [93 %-95 %] 93 % (03/07 1438) Last BM Date : 07/17/21  Intake/Output from previous day: 03/06 0701 - 03/07 0700 In: 365 [I.V.:315; IV Piggyback:50] Out: 300 [Urine:300] Intake/Output this shift: Total I/O In: -  Out: 100 [Urine:100]  PE: Heart: regular, + murmur Lungs: CTAB Abd: more distended today and tympanitic, +BS, more tender diffusely, greatest in upper abdomen and RLQ  Lab Results:  Recent Labs    07/17/21 0405 07/18/21 0429  WBC 10.2 8.6  HGB 10.3* 10.7*  HCT 33.1* 34.2*  PLT 163 161   BMET Recent Labs    07/17/21 0405 07/18/21 0429  NA 139 140  K 3.7 3.7  CL 107 110  CO2 25 24  GLUCOSE 121* 139*  BUN 14 12  CREATININE 0.58   0.58 0.60  CALCIUM 8.6* 8.3*   PT/INR Recent Labs    07/17/21 0405 07/18/21 0429  LABPROT 25.3* 22.0*  INR 2.3* 1.9*   CMP     Component Value Date/Time   NA 140 07/18/2021 0429   NA 143 02/14/2021 0000   NA 145 03/19/2017 0000   K 3.7 07/18/2021 0429   K 5.9 03/19/2017 0000   CL 110 07/18/2021 0429   CO2 24 07/18/2021 0429   GLUCOSE 139 (H) 07/18/2021 0429   BUN 12 07/18/2021 0429   BUN 34 (A) 02/14/2021 0000   CREATININE 0.60 07/18/2021 0429   CREATININE 0.95 (H) 04/25/2020 0800   CALCIUM 8.3 (L) 07/18/2021 0429   CALCIUM 9.1 03/19/2017 0000   PROT 6.0 (L) 07/15/2021 1525   PROT 6.7 03/19/2017 0000   ALBUMIN 2.9 (L) 07/18/2021 0429   ALBUMIN 3.7 03/19/2017 0000   AST 13 (L) 07/15/2021 1525   AST 22 03/19/2017 0000   ALT 8  08/05/2021 1525   ALT 38 03/19/2017 0000   ALKPHOS 79 07/25/2021 1525   ALKPHOS 120 03/19/2017 0000   BILITOT 0.4 07/15/2021 1525   BILITOT 0.3 03/19/2017 0000   GFRNONAA >60 07/18/2021 0429   GFRNONAA 51 (L) 04/25/2020 0800   GFRAA 59 (L) 04/25/2020 0800   Lipase     Component Value Date/Time   LIPASE 44 08/01/2021 1525       Studies/Results: CT ABDOMEN PELVIS W CONTRAST  Result Date: 07/27/2021 CLINICAL DATA:  Lower abdominal pain. EXAM: CT ABDOMEN AND PELVIS WITH CONTRAST TECHNIQUE: Multidetector CT imaging of the abdomen and pelvis was performed using the standard protocol following bolus administration of intravenous contrast. RADIATION DOSE REDUCTION: This exam was performed according to the departmental dose-optimization program which includes automated exposure control, adjustment of the mA and/or kV according to patient size and/or use of iterative reconstruction technique. CONTRAST:  169m OMNIPAQUE IOHEXOL 300 MG/ML  SOLN COMPARISON:  09/29/2018 FINDINGS: Lower chest: No acute abnormality. Cardiomegaly. Aortic valve calcifications. Coronary artery calcifications. Scarring and or fibrosis of the included bilateral lung bases. Hepatobiliary: No focal liver abnormality is seen. Status post cholecystectomy. No biliary dilatation. Pancreas: Unremarkable. No pancreatic ductal dilatation or surrounding inflammatory  changes. Spleen: Normal in size without significant abnormality. Adrenals/Urinary Tract: Adrenal glands are unremarkable. Kidneys are normal, without renal calculi, solid lesion, or hydronephrosis. Bladder is unremarkable. Stomach/Bowel: Stomach is within normal limits. The appendix is fluid-filled, slightly hyperenhancing, and nondilated, however there is extensive adjacent fat stranding and small locules of extraluminal air (series 2, image 51). No evidence of bowel wall thickening, distention, or inflammatory changes. Vascular/Lymphatic: Aortic atherosclerosis. No enlarged  abdominal or pelvic lymph nodes. Reproductive: Status post hysterectomy. Other: No abdominal wall hernia or abnormality. No ascites. Musculoskeletal: No acute or significant osseous findings. IMPRESSION: 1. The appendix is fluid-filled, slightly hyperenhancing, and nondilated, however there is extensive adjacent fat stranding and small locules of extraluminal air. Findings are consistent with acute appendicitis complicated by micro perforation. 2. Cardiomegaly and coronary artery disease. These results were called by telephone at the time of interpretation on 08/07/2021 at 5:01 pm to Cameron , who verbally acknowledged these results. Aortic Atherosclerosis (ICD10-I70.0). Electronically Signed   By: Delanna Ahmadi M.D.   On: 07/14/2021 17:04   ECHOCARDIOGRAM COMPLETE  Result Date: 07/17/2021    ECHOCARDIOGRAM REPORT   Patient Name:   [name] Date of Exam: 07/17/2021 Medical Rec #:  [id number]          Height:       60.0 in Accession #:    [id number]         Weight:       131.0 lb Date of Birth:  [birth year]         BSA:          1.559 m Patient Age:    88 years           BP:           117/60 mmHg Patient Gender: F                  HR:           90 bpm. Exam Location:  Inpatient Procedure: 2D Echo Indications:    Other abnormalities of the heart  History:        Patient has prior history of Echocardiogram examinations, most                 recent 03/09/2015. CHF; Arrythmias:Atrial Fibrillation.  Sonographer:    Jefferey Pica Referring Phys: [id number] TAYE T GONFA IMPRESSIONS  1. Left ventricular ejection fraction, by estimation, is 50%. The left ventricle has mildly decreased function. The left ventricle demonstrates global hypokinesis. There is moderate concentric left ventricular hypertrophy. Left ventricular diastolic parameters are consistent with Grade I diastolic dysfunction (impaired relaxation).  2. Right ventricular systolic function is normal. The right ventricular size is normal. There is  moderately elevated pulmonary artery systolic pressure. The estimated right ventricular systolic pressure is 57.0 mmHg.  3. The mitral valve is degenerative. Mild mitral valve regurgitation. Moderate mitral stenosis. The mean mitral valve gradient is 7.0 mmHg. Moderate mitral annular and valvular calcification with restricted motion. MVA 1.43 cm^2 by VTI.  4. The aortic valve is tricuspid. There is severe calcifcation of the aortic valve. Aortic valve regurgitation is trivial. Severe aortic valve stenosis. Aortic valve area, by VTI measures 0.55 cm. Aortic valve mean gradient measures 42.0 mmHg.  5. Left atrial size was mildly dilated.  6. The inferior vena cava is normal in size with <50% respiratory variability, suggesting right atrial pressure of 8 mmHg. FINDINGS  Left Ventricle: Left ventricular ejection fraction, by estimation, is 50%.  The left ventricle has mildly decreased function. The left ventricle demonstrates global hypokinesis. The left ventricular internal cavity size was normal in size. There is moderate concentric left ventricular hypertrophy. Left ventricular diastolic parameters are consistent with Grade I diastolic dysfunction (impaired relaxation). Right Ventricle: The right ventricular size is normal. No increase in right ventricular wall thickness. Right ventricular systolic function is normal. There is moderately elevated pulmonary artery systolic pressure. The tricuspid regurgitant velocity is 3.30 m/s, and with an assumed right atrial pressure of 8 mmHg, the estimated right ventricular systolic pressure is 95.0 mmHg. Left Atrium: Left atrial size was mildly dilated. Right Atrium: Right atrial size was normal in size. Pericardium: Trivial pericardial effusion is present. Mitral Valve: The mitral valve is degenerative in appearance. There is moderate calcification of the mitral valve leaflet(s). Moderate mitral annular calcification. Mild mitral valve regurgitation. Moderate mitral valve  stenosis. MV peak gradient, 15.6 mmHg. The mean mitral valve gradient is 7.0 mmHg. Tricuspid Valve: The tricuspid valve is normal in structure. Tricuspid valve regurgitation is mild. Aortic Valve: The aortic valve is tricuspid. There is severe calcifcation of the aortic valve. Aortic valve regurgitation is trivial. Severe aortic stenosis is present. Aortic valve mean gradient measures 42.0 mmHg. Aortic valve peak gradient measures 62.7 mmHg. Aortic valve area, by VTI measures 0.55 cm. Pulmonic Valve: The pulmonic valve was normal in structure. Pulmonic valve regurgitation is trivial. Aorta: The aortic root is normal in size and structure. Venous: The inferior vena cava is normal in size with less than 50% respiratory variability, suggesting right atrial pressure of 8 mmHg. IAS/Shunts: No atrial level shunt detected by color flow Doppler. Additional Comments: A device lead is visualized in the right ventricle.  LEFT VENTRICLE PLAX 2D LVIDd:         4.10 cm   Diastology LVIDs:         2.80 cm   LV e' medial:    3.44 cm/s LV PW:         1.30 cm   LV E/e' medial:  33.1 LV IVS:        1.30 cm   LV e' lateral:   5.12 cm/s LVOT diam:     1.80 cm   LV E/e' lateral: 22.3 LV SV:         49 LV SV Index:   31 LVOT Area:     2.54 cm  RIGHT VENTRICLE             IVC RV S prime:     10.90 cm/s  IVC diam: 1.90 cm TAPSE (M-mode): 1.7 cm LEFT ATRIUM             Index        RIGHT ATRIUM           Index LA diam:        3.80 cm 2.44 cm/m   RA Area:     11.90 cm LA Vol (A2C):   56.8 ml 36.42 ml/m  RA Volume:   27.90 ml  17.89 ml/m LA Vol (A4C):   38.1 ml 24.43 ml/m LA Biplane Vol: 47.6 ml 30.52 ml/m  AORTIC VALVE                     PULMONIC VALVE AV Area (Vmax):    0.61 cm      PV Vmax:       0.87 m/s AV Area (Vmean):   0.51 cm      PV Peak  grad:  3.1 mmHg AV Area (VTI):     0.55 cm AV Vmax:           396.00 cm/s AV Vmean:          284.200 cm/s AV VTI:            0.895 m AV Peak Grad:      62.7 mmHg AV Mean Grad:      42.0  mmHg LVOT Vmax:         95.00 cm/s LVOT Vmean:        56.700 cm/s LVOT VTI:          0.193 m LVOT/AV VTI ratio: 0.22  AORTA Ao Root diam: 2.90 cm Ao Asc diam:  3.20 cm MITRAL VALVE                TRICUSPID VALVE MV Area (PHT): 4.04 cm     TR Peak grad:   43.6 mmHg MV Area VTI:   1.28 cm     TR Vmax:        330.00 cm/s MV Peak grad:  15.6 mmHg MV Mean grad:  7.0 mmHg     SHUNTS MV Vmax:       1.98 m/s     Systemic VTI:  0.19 m MV Vmean:      122.5 cm/s   Systemic Diam: 1.80 cm MV Decel Time: 188 msec MR Peak grad: 142.6 mmHg MR Vmax:      597.00 cm/s MV E velocity: 114.00 cm/s MV A velocity: 161.00 cm/s MV E/A ratio:  0.71 Dalton McleanMD Electronically signed by Franki Monte Signature Date/Time: 07/17/2021/4:55:02 PM    Final     Anti-infectives: Anti-infectives (From admission, onward)    Start     Dose/Rate Route Frequency Ordered Stop   07/17/21 0200  piperacillin-tazobactam (ZOSYN) IVPB 3.375 g        3.375 g 12.5 mL/hr over 240 Minutes Intravenous Every 8 hours 08/08/2021 1733 07/22/21 0159   07/20/2021 1815  piperacillin-tazobactam (ZOSYN) IVPB 3.375 g        3.375 g 100 mL/hr over 30 Minutes Intravenous  Once 07/13/2021 1802 08/01/2021 1902   07/15/2021 1730  metroNIDAZOLE (FLAGYL) IVPB 500 mg  Status:  Discontinued        500 mg 100 mL/hr over 60 Minutes Intravenous  Once 07/21/2021 1725 08/04/2021 1733   08/07/2021 1715  cefTRIAXone (ROCEPHIN) 1 g in sodium chloride 0.9 % 100 mL IVPB  Status:  Discontinued        1 g 200 mL/hr over 30 Minutes Intravenous  Once 07/19/2021 1709 08/07/2021 1725   08/10/2021 1645  cefTRIAXone (ROCEPHIN) 1 g in sodium chloride 0.9 % 100 mL IVPB  Status:  Discontinued        1 g 200 mL/hr over 30 Minutes Intravenous  Once 07/18/2021 1640 07/15/2021 1730        Assessment/Plan Acute perforated appendicitis -labs and vitals all reviewed and are actually stable; however, patient seems to be in more pain today and doesn't feel well despite pain medications overnight.  -given  increasing distention and abdominal pain, check plain film to eval for free air or worsening of bowel dilatation. -follow labs and vitals -cont NPO and IV abx therapy -cont to hold coumadin, INR 1.9 today  FEN - NPO/IVFs VTE - on hold, INR 1.9 ID - [email or web address]  Severe aortic stenosis Moderate mitral stenosis H/O A fib Diastolic HF H/O DVT/PE HTN HLD  Moderate Medical Decision Making I  reviewed nursing notes, hospitalist notes, last 24 h vitals and pain scores, last 24 h labs and trends, and last 24 h imaging results.   LOS: 2 days    Henreitta Cea , [facility] 07/18/2021, 8:20 AM Please see Amion for pager number during day hours 7:00am-4:30pm or 7:00am -11:30am on weekends

## 2021-07-18 NOTE — Progress Notes (Signed)
PROGRESS NOTE  [name] RJJ:[id number] DOB: Sep 19, 1922   PCP: [name], MD  Patient is from: ALF/[facility].  Uses rollator at baseline.  DOA: 07/15/2021 LOS: 2  Chief complaints Abdominal pain    Brief Narrative / Interim history: 88 y.o. female with PMH of dementia, diastolic CHF, A-fib/DVT on warfarin, AVB/PPM, CKD-3A, anemia and hypothyroidism presenting with bilateral lower quadrant pain, right> left for about a week that has gotten worse 24 hours prior to presentation, and admitted for acute perforated but contained appendicitis.  General surgery consulted.  Started on IV Zosyn.  Patient's abdominal pain seems to be worsening.  Now with more distention.  TTE with LVEF of 50%, severe aortic valve stenosis and moderate mitral valve stenosis.  Patient is high risk surgery.  General surgery discussed treatment options with patient's daughter who is to discuss with the rest of the family before making decision about pursuing surgery or changing to comfort care.  Palliative care consulted.    Subjective: Seen and examined earlier this morning.  Abdominal pain and distention seems to be worse.  Looks uncomfortable.  Also coughing some but no respiratory distress.  Patient's daughter at bedside.  X-ray is concerning for free air in abdomen.  Discussed finding and risk of surgery with patient's daughter at bedside.  She understand that she is high risk if they pursue surgery given her age, comorbidity particularly with severe aortic stenosis.  She is DNR and DNI.  Briefly brought up about philosophy of comfort care but defered surgical decision to general surgery.  Offered palliative consult, and she appreciated.  Objective: Vitals:   07/17/21 1416 07/17/21 1657 07/17/21 2032 07/18/21 0638  BP: 121/62 132/60 125/69 (!) 148/84  Pulse: 86 92 95 (!) 107  Resp: '18 16 16 16  '$ Temp: 98.3 F (36.8 C) 98.7 F (37.1 C) 98.1 F (36.7 C) 97.7 F (36.5 C)  TempSrc:   Oral Oral   SpO2: 94% 93% 95% 93%  Weight:      Height:        Examination:  GENERAL: Appears uncomfortable.  Smiling and crying through pain HEENT: MMM.  Vision and hearing grossly intact.  NECK: Supple.  No apparent JVD.  RESP:  No IWOB.  Fair aeration bilaterally. CVS:  RRR.  3/6 SEM all over ABD/GI/GU: BS+. Abd distended.  Tender.  MSK/EXT:  Moves extremities. No apparent deformity.  Trace BLE edema SKIN: no apparent skin lesion or wound NEURO: Awake and alert. Oriented fairly.  No apparent focal neuro deficit. PSYCH: Calm. Normal affect.   Procedures:  None  Microbiology summarized: [id number] and influenza PCR nonreactive. Urine culture with pansensitive E. coli.  Assessment and Plan: * Acute perforated appendicitis Presents with abdominal pain for a week. Worse the night before presentation.  Exam was RLQ tenderness.  CT A/P concerning for acute appendicitis with contained microperforation.  Abdominal pain worse.  Now with distention and concern for pneumoperitoneum.  TTE with severe aortic stenosis.  -General surgery-high risk for surgery.  May need ICU stay and vent after surgery.  She is DNR -Daughter to discuss treatment options with the rest of the family and make decision -Palliative care consulted. -Continue IV Zosyn -On warfarin.  INR 2.5> 2.3>1.9.  Hold warfarin.  Can reverse INR with FFP or vitamin K if needed -We will try 1 dose IV Dilaudid for pain.  If she does well with this, we will continue -N.p.o. except ice chips  Left shoulder pain Resolved.  Exam reassuring. -Already  on IV analgesics for appendicitis  Severe aortic stenosis Not a surgical candidate.  Chronic diastolic CHF (congestive heart failure) (HCC) No cardiopulmonary symptoms but TTE with severe aortic stenosis. -Continue holding torsemide -Continue IV fluid cautiously -Strict I&O and daily weight  History of DVT (deep vein thrombosis) Remote but not clear if it is provoked or  not -Anticoagulation as above  PAROXYSMAL ATRIAL FIBRILLATION Rate controlled without medication.  On warfarin for anticoagulation -INR 2.5> 2.3> 1.9.  Continue holding warfarin. -IV metoprolol 2.5 mg every 6 hours as needed sustained HR > 100  Bacteriuria Urine culture with pansensitive E. coli but patient without new bladder habit or UTI symptoms. -Already on IV Ancef for MSSA which should cover E. coli  Protein-calorie malnutrition, moderate (HCC) Prealbumin 13.2.  Body mass index is 25.58 kg/m. -We will consult dietitian once she started eating  Iron deficiency anemia Recent Labs    10/20/20 0000 11/24/20 0000 02/14/21 0000 07/25/2021 1525 07/17/21 0405 07/18/21 0429  HGB 11.0* 12.7 12.8 10.2* 10.3* 10.7*  -Iron saturation of 6%, low normal ferritin and normal TIBC.  No report of melena or hematochezia. -Monitor H&H-stable -Patient and daughter verbally consented if blood transfusion is indicated -Will give IV iron prior to discharge once infection calms down   Hypertension IV metoprolol as needed  Chronic kidney disease, stage 3a ([place]) Recent Labs    07/21/20 0000 10/20/20 0000 11/24/20 0000 02/14/21 0000 08/08/2021 1525 07/17/21 0405 07/18/21 0429  BUN 30* 32* 39* 34* 24* 14 12  CREATININE 1.1 0.9 1.1 1.2* 1.02* 0.58   0.58 0.60  Creatinine down to 0.6.  Baseline 0.9-1.1 -Monitor   Depression, recurrent ([place]) Resume home Zoloft once she started taking p.o.  GERD (gastroesophageal reflux disease) IV PPI while n.p.o.  Mixed incontinence urge and stress Resume home oxybutynin once she started taking p.o.  Cognitive impairment Oriented x4 except date of months.  Good sense of humor.  She has hard of hearing -Reorientation and delirium precaution. -Resume home meds once she started taking p.o.  Hypothyroidism Resume home Synthroid once she started taking p.o. IV Synthroid if she remains n.p.o. over 72 hours  Goals of care,  counseling/discussion DNR/DNI appropriate.  Now with acute perforated appendicitis with concern for pneumoperitoneum.  Worsening abdominal pain and distention.  Also with severe aortic stenosis.  High risk for surgery.  -Goal of care discussion initiated.  Daughter to discuss with the rest of the family before making decision -Palliative medicine consulted  Third degree AV block S/p PPM EKG with V paced rhythm.   DVT prophylaxis:  INR therapeutic.  Warfarin on hold in case of surgery  Code Status: DNR/DNI Family Communication: Updated patient's daughter at bedside. Level of care: Med-Surg Status is: Inpatient Remains inpatient appropriate because: Acute appendicitis requiring IV antibiotics    Final disposition: TBD  Consultants:  General surgery Palliative medicine  Sch Meds:  Scheduled Meds:   HYDROmorphone (DILAUDID) injection  0.5 mg Intravenous Once   lip balm  1 application. Topical BID   pantoprazole (PROTONIX) IV  40 mg Intravenous Q24H   Continuous Infusions:  albumin human     dextrose 5 % and 0.9 % NaCl with KCl 20 mEq/L 60 mL/hr at 07/18/21 0815   methocarbamol (ROBAXIN) IV     ondansetron (ZOFRAN) IV     piperacillin-tazobactam (ZOSYN)  IV 3.375 g (07/18/21 1138)   potassium PHOSPHATE IVPB (in mmol) 30 mmol (07/18/21 0822)   PRN Meds:.acetaminophen, acetaminophen, albumin human, alum & mag hydroxide-simeth, bisacodyl,  fentaNYL (SUBLIMAZE) injection, magic mouthwash, menthol-cetylpyridinium, methocarbamol (ROBAXIN) IV, metoprolol tartrate, ondansetron (ZOFRAN) IV **OR** ondansetron (ZOFRAN) IV, phenol, prochlorperazine, simethicone  Antimicrobials: Anti-infectives (From admission, onward)    Start     Dose/Rate Route Frequency Ordered Stop   07/17/21 0200  piperacillin-tazobactam (ZOSYN) IVPB 3.375 g        3.375 g 12.5 mL/hr over 240 Minutes Intravenous Every 8 hours 08/02/2021 1733 07/22/21 0159   08/03/2021 1815  piperacillin-tazobactam (ZOSYN) IVPB 3.375 g         3.375 g 100 mL/hr over 30 Minutes Intravenous  Once 07/20/2021 1802 08/06/2021 1902   07/14/2021 1730  metroNIDAZOLE (FLAGYL) IVPB 500 mg  Status:  Discontinued        500 mg 100 mL/hr over 60 Minutes Intravenous  Once 07/21/2021 1725 08/04/2021 1733   07/22/2021 1715  cefTRIAXone (ROCEPHIN) 1 g in sodium chloride 0.9 % 100 mL IVPB  Status:  Discontinued        1 g 200 mL/hr over 30 Minutes Intravenous  Once 07/23/2021 1709 07/13/2021 1725   07/26/2021 1645  cefTRIAXone (ROCEPHIN) 1 g in sodium chloride 0.9 % 100 mL IVPB  Status:  Discontinued        1 g 200 mL/hr over 30 Minutes Intravenous  Once 07/17/2021 1640 07/20/2021 1730        I have personally reviewed the following labs and images: CBC: Recent Labs  Lab 07/27/2021 1525 07/17/21 0405 07/18/21 0429  WBC 9.9 10.2 8.6  NEUTROABS 7.5  --   --   HGB 10.2* 10.3* 10.7*  HCT 33.2* 33.1* 34.2*  MCV 98.2 98.5 98.8  PLT 160 163 161   BMP &GFR Recent Labs  Lab 07/13/2021 1525 07/17/21 0405 07/18/21 0429  NA 141 139 140  K 3.6 3.7 3.7  CL 105 107 110  CO2 '27 25 24  '$ GLUCOSE 150* 121* 139*  BUN 24* 14 12  CREATININE 1.02* 0.58   0.58 0.60  CALCIUM 9.1 8.6* 8.3*  MG  --  2.2 2.3  PHOS  --  2.3* 1.9*   Estimated Creatinine Clearance: 33.2 mL/min (by C-G formula based on SCr of 0.6 mg/dL). Liver & Pancreas: Recent Labs  Lab 08/06/2021 1525 07/17/21 0405 07/18/21 0429  AST 13*  --   --   ALT 8  --   --   ALKPHOS 79  --   --   BILITOT 0.4  --   --   PROT 6.0*  --   --   ALBUMIN 3.2* 3.1* 2.9*   Recent Labs  Lab 07/15/2021 1525  LIPASE 44   No results for input(s): AMMONIA in the last 168 hours. Diabetic: Recent Labs    07/17/21 0405  HGBA1C 5.3   No results for input(s): GLUCAP in the last 168 hours. Cardiac Enzymes: No results for input(s): CKTOTAL, CKMB, CKMBINDEX, TROPONINI in the last 168 hours. No results for input(s): PROBNP in the last 8760 hours. Coagulation Profile: Recent Labs  Lab 07/29/2021 1525 07/17/21 0405  07/18/21 0429  INR 2.5* 2.3* 1.9*   Thyroid Function Tests: No results for input(s): TSH, T4TOTAL, FREET4, T3FREE, THYROIDAB in the last 72 hours. Lipid Profile: No results for input(s): CHOL, HDL, LDLCALC, TRIG, CHOLHDL, LDLDIRECT in the last 72 hours. Anemia Panel: Recent Labs    08/07/2021 1824 07/26/2021 1825  VITAMINB12 201  --   FOLATE 12.4  --   FERRITIN 52  --   TIBC 290  --   IRON 16*  --  RETICCTPCT  --  2.0   Urine analysis:    Component Value Date/Time   COLORURINE YELLOW 07/29/2021 1613   APPEARANCEUR HAZY (A) 07/30/2021 1613   LABSPEC 1.023 07/20/2021 1613   PHURINE 5.0 08/06/2021 1613   GLUCOSEU NEGATIVE 08/06/2021 1613   HGBUR NEGATIVE 08/01/2021 1613   BILIRUBINUR NEGATIVE 07/30/2021 1613   KETONESUR 5 (A) 07/19/2021 1613   PROTEINUR NEGATIVE 08/06/2021 1613   UROBILINOGEN 0.2 11/01/2013 1010   NITRITE NEGATIVE 07/14/2021 1613   LEUKOCYTESUR MODERATE (A) 07/15/2021 1613   Sepsis Labs: Invalid input(s): PROCALCITONIN, [place]  Microbiology: Recent Results (from the past 240 hour(s))  Resp Panel by RT-PCR (Flu A&B, Covid) Nasopharyngeal Swab     Status: None   Collection Time: 07/31/2021  3:26 PM   Specimen: Nasopharyngeal Swab; Nasopharyngeal(NP) swabs in vial transport medium  Result Value Ref Range Status   SARS Coronavirus 2 by RT PCR NEGATIVE NEGATIVE Final    Comment: (NOTE) SARS-CoV-2 target nucleic acids are NOT DETECTED.  The SARS-CoV-2 RNA is generally detectable in upper respiratory specimens during the acute phase of infection. The lowest concentration of SARS-CoV-2 viral copies this assay can detect is 138 copies/mL. A negative result does not preclude SARS-Cov-2 infection and should not be used as the sole basis for treatment or other patient management decisions. A negative result may occur with  improper specimen collection/handling, submission of specimen other than nasopharyngeal swab, presence of viral mutation(s) within  the areas targeted by this assay, and inadequate number of viral copies(<138 copies/mL). A negative result must be combined with clinical observations, patient history, and epidemiological information. The expected result is Negative.  Fact Sheet for Patients:  EntrepreneurPulse.com.au  Fact Sheet for Healthcare Providers:  IncredibleEmployment.be  This test is no t yet approved or cleared by the [place] FDA and  has been authorized for detection and/or diagnosis of SARS-CoV-2 by FDA under an Emergency Use Authorization (EUA). This EUA will remain  in effect (meaning this test can be used) for the duration of the COVID-19 declaration under Section 564(b)(1) of the Act, 21 U.S.C.section 360bbb-3(b)(1), unless the authorization is terminated  or revoked sooner.       Influenza A by PCR NEGATIVE NEGATIVE Final   Influenza B by PCR NEGATIVE NEGATIVE Final    Comment: (NOTE) The Xpert Xpress SARS-CoV-2/FLU/RSV plus assay is intended as an aid in the diagnosis of influenza from Nasopharyngeal swab specimens and should not be used as a sole basis for treatment. Nasal washings and aspirates are unacceptable for Xpert Xpress SARS-CoV-2/FLU/RSV testing.  Fact Sheet for Patients: EntrepreneurPulse.com.au  Fact Sheet for Healthcare Providers: IncredibleEmployment.be  This test is not yet approved or cleared by the [place] FDA and has been authorized for detection and/or diagnosis of SARS-CoV-2 by FDA under an Emergency Use Authorization (EUA). This EUA will remain in effect (meaning this test can be used) for the duration of the COVID-19 declaration under Section 564(b)(1) of the Act, 21 U.S.C. section 360bbb-3(b)(1), unless the authorization is terminated or revoked.  Performed at [facility], [place] [address] [place], [place] [address]   Urine Culture     Status: Abnormal    Collection Time: 07/31/2021  4:13 PM   Specimen: Urine, Clean Catch  Result Value Ref Range Status   Specimen Description   Final    URINE, CLEAN CATCH Performed at [facility], [place] [address] [place], [place] [address]    Special Requests   Final  NONE Performed at [facility], [place] [address], [place], [place] [address]    Culture 60,000 COLONIES/mL ESCHERICHIA COLI (A)  Final   Report Status 07/18/2021 FINAL  Final   Organism ID, Bacteria ESCHERICHIA COLI (A)  Final      Susceptibility   Escherichia coli - MIC*    AMPICILLIN <=2 SENSITIVE Sensitive     CEFAZOLIN <=4 SENSITIVE Sensitive     CEFEPIME <=0.12 SENSITIVE Sensitive     CEFTRIAXONE <=0.25 SENSITIVE Sensitive     CIPROFLOXACIN <=0.25 SENSITIVE Sensitive     GENTAMICIN <=1 SENSITIVE Sensitive     IMIPENEM <=0.25 SENSITIVE Sensitive     NITROFURANTOIN <=16 SENSITIVE Sensitive     TRIMETH/SULFA <=20 SENSITIVE Sensitive     AMPICILLIN/SULBACTAM <=2 SENSITIVE Sensitive     PIP/TAZO <=4 SENSITIVE Sensitive     * 60,000 COLONIES/mL ESCHERICHIA COLI    Radiology Studies: DG Abd Decub  Result Date: 07/18/2021 CLINICAL DATA:  Abdominal distension, acute perforated appendicitis EXAM: ABDOMEN - 1 VIEW DECUBITUS COMPARISON:  07/18/2021 FINDINGS: Small amount of pneumoperitoneum along the lateral margin of the liver. No bowel dilatation to suggest obstruction. No pathologic calcifications along the expected course of the ureters. No acute osseous abnormality. IMPRESSION: 1. Small amount of pneumoperitoneum along the lateral margin of the liver. In the absence of recent instrumentation or surgery, recommend a follow-up CT of the abdomen. Electronically Signed   By: Kathreen Devoid M.D.   On: 07/18/2021 09:43   DG Abd Portable 1V  Result Date: 07/18/2021 CLINICAL DATA:  Acute appendicitis.  Right lower quadrant pain. EXAM: PORTABLE ABDOMEN - 1 VIEW COMPARISON:  CT abdomen pelvis 07/15/2021. FINDINGS:  Bowel gas pattern is unremarkable. Progressive free air is suggested. Decubitus imaging could be used for further evaluation to confirm the free air. IMPRESSION: Progressive free air is suggested. Decubitus imaging could be used for further evaluation to confirm the free air. Critical Value/emergent results were called by telephone at the time of interpretation on 07/18/2021 at 8:47 am to provider [facility] , who verbally acknowledged these results. Electronically Signed   By: San Morelle M.D.   On: 07/18/2021 09:02   ECHOCARDIOGRAM COMPLETE  Result Date: 07/17/2021    ECHOCARDIOGRAM REPORT   Patient Name:   [name] Date of Exam: 07/17/2021 Medical Rec #:  [id number]          Height:       60.0 in Accession #:    [id number]         Weight:       131.0 lb Date of Birth:  1922/11/04         BSA:          1.559 m Patient Age:    20 years           BP:           117/60 mmHg Patient Gender: F                  HR:           90 bpm. Exam Location:  Inpatient Procedure: 2D Echo Indications:    Other abnormalities of the heart  History:        Patient has prior history of Echocardiogram examinations, most                 recent 03/09/2015. CHF; Arrythmias:Atrial Fibrillation.  Sonographer:    Jefferey Pica Referring Phys: [id number] [place]  1. Left ventricular ejection fraction, by estimation, is 50%. The left ventricle has mildly decreased function. The left ventricle demonstrates global hypokinesis. There is moderate concentric left ventricular hypertrophy. Left ventricular diastolic parameters are consistent with Grade I diastolic dysfunction (impaired relaxation).  2. Right ventricular systolic function is normal. The right ventricular size is normal. There is moderately elevated pulmonary artery systolic pressure. The estimated right ventricular systolic pressure is 33.2 mmHg.  3. The mitral valve is degenerative. Mild mitral valve regurgitation. Moderate mitral stenosis. The  mean mitral valve gradient is 7.0 mmHg. Moderate mitral annular and valvular calcification with restricted motion. MVA 1.43 cm^2 by VTI.  4. The aortic valve is tricuspid. There is severe calcifcation of the aortic valve. Aortic valve regurgitation is trivial. Severe aortic valve stenosis. Aortic valve area, by VTI measures 0.55 cm. Aortic valve mean gradient measures 42.0 mmHg.  5. Left atrial size was mildly dilated.  6. The inferior vena cava is normal in size with <50% respiratory variability, suggesting right atrial pressure of 8 mmHg. FINDINGS  Left Ventricle: Left ventricular ejection fraction, by estimation, is 50%. The left ventricle has mildly decreased function. The left ventricle demonstrates global hypokinesis. The left ventricular internal cavity size was normal in size. There is moderate concentric left ventricular hypertrophy. Left ventricular diastolic parameters are consistent with Grade I diastolic dysfunction (impaired relaxation). Right Ventricle: The right ventricular size is normal. No increase in right ventricular wall thickness. Right ventricular systolic function is normal. There is moderately elevated pulmonary artery systolic pressure. The tricuspid regurgitant velocity is 3.30 m/s, and with an assumed right atrial pressure of 8 mmHg, the estimated right ventricular systolic pressure is 95.1 mmHg. Left Atrium: Left atrial size was mildly dilated. Right Atrium: Right atrial size was normal in size. Pericardium: Trivial pericardial effusion is present. Mitral Valve: The mitral valve is degenerative in appearance. There is moderate calcification of the mitral valve leaflet(s). Moderate mitral annular calcification. Mild mitral valve regurgitation. Moderate mitral valve stenosis. MV peak gradient, 15.6 mmHg. The mean mitral valve gradient is 7.0 mmHg. Tricuspid Valve: The tricuspid valve is normal in structure. Tricuspid valve regurgitation is mild. Aortic Valve: The aortic valve is  tricuspid. There is severe calcifcation of the aortic valve. Aortic valve regurgitation is trivial. Severe aortic stenosis is present. Aortic valve mean gradient measures 42.0 mmHg. Aortic valve peak gradient measures 62.7 mmHg. Aortic valve area, by VTI measures 0.55 cm. Pulmonic Valve: The pulmonic valve was normal in structure. Pulmonic valve regurgitation is trivial. Aorta: The aortic root is normal in size and structure. Venous: The inferior vena cava is normal in size with less than 50% respiratory variability, suggesting right atrial pressure of 8 mmHg. IAS/Shunts: No atrial level shunt detected by color flow Doppler. Additional Comments: A device lead is visualized in the right ventricle.  LEFT VENTRICLE PLAX 2D LVIDd:         4.10 cm   Diastology LVIDs:         2.80 cm   LV e' medial:    3.44 cm/s LV PW:         1.30 cm   LV E/e' medial:  33.1 LV IVS:        1.30 cm   LV e' lateral:   5.12 cm/s LVOT diam:     1.80 cm   LV E/e' lateral: 22.3 LV SV:         49 LV SV Index:   31 LVOT Area:     2.54  cm  RIGHT VENTRICLE             IVC RV S prime:     10.90 cm/s  IVC diam: 1.90 cm TAPSE (M-mode): 1.7 cm LEFT ATRIUM             Index        RIGHT ATRIUM           Index LA diam:        3.80 cm 2.44 cm/m   RA Area:     11.90 cm LA Vol (A2C):   56.8 ml 36.42 ml/m  RA Volume:   27.90 ml  17.89 ml/m LA Vol (A4C):   38.1 ml 24.43 ml/m LA Biplane Vol: 47.6 ml 30.52 ml/m  AORTIC VALVE                     PULMONIC VALVE AV Area (Vmax):    0.61 cm      PV Vmax:       0.87 m/s AV Area (Vmean):   0.51 cm      PV Peak grad:  3.1 mmHg AV Area (VTI):     0.55 cm AV Vmax:           396.00 cm/s AV Vmean:          284.200 cm/s AV VTI:            0.895 m AV Peak Grad:      62.7 mmHg AV Mean Grad:      42.0 mmHg LVOT Vmax:         95.00 cm/s LVOT Vmean:        56.700 cm/s LVOT VTI:          0.193 m LVOT/AV VTI ratio: 0.22  AORTA Ao Root diam: 2.90 cm Ao Asc diam:  3.20 cm MITRAL VALVE                TRICUSPID VALVE MV  Area (PHT): 4.04 cm     TR Peak grad:   43.6 mmHg MV Area VTI:   1.28 cm     TR Vmax:        330.00 cm/s MV Peak grad:  15.6 mmHg MV Mean grad:  7.0 mmHg     SHUNTS MV Vmax:       1.98 m/s     Systemic VTI:  0.19 m MV Vmean:      122.5 cm/s   Systemic Diam: 1.80 cm MV Decel Time: 188 msec MR Peak grad: 142.6 mmHg MR Vmax:      597.00 cm/s MV E velocity: 114.00 cm/s MV A velocity: 161.00 cm/s MV E/A ratio:  0.71 Dalton McleanMD Electronically signed by Franki Monte Signature Date/Time: 07/17/2021/4:55:02 PM    Final       Shatora Weatherbee T. [place]  If 7PM-7AM, please contact night-coverage [email or web address] 07/18/2021, 11:41 AM

## 2021-07-18 NOTE — Consult Note (Cosign Needed)
Consultation Note Date: 07/18/2021   Patient Name: [name]  DOB: Oct 05, 1922  MRN: [id number]  Age / Sex: 88 y.o., female  PCP: Nicole Dad, MD Referring Physician: Mercy Riding, MD  Reason for Consultation: Establishing goals of care  HPI/Patient Profile: 88 y.o. female  with past medical history of dementia, anemia, hypothyroidism, diastolic heart failure, atrial fibrillation on warfarin, HTN, HLD, heart block s/p PPM, CKD stage 3a admitted on 07/14/2021 with abdominal pain due to acute perforated appendicitis.   Clinical Assessment and Goals of Care: I met today after reviewing records and speaking with surgery team APPs. There have had multiple conversations regarding path forward and if they wish to pursue surgical intervention or not. I met at [name]'s bedside and she is pleasant and interactive but somewhat confused. She has poor recall and poor memory. She is surrounded by her loving family including daughter [name] and multiple grandchildren. I stepped away from bedside to speak in more detail with family.   I spoke with [name] and her son. We reviewed decision for no surgery and they feel at peace knowing that [name] had a clear moment and was clear on her wishes. We discussed the risks of surgery from cardiac, recovery, and mental after anesthesia. We discussed and reviewed expectations that she will continue to decline with anticipation that her mentation will decline as her infection progresses. We discussed having comfort medications in place but continuing antibiotics currently. We discussed goal to maintain her in current state as long as possible to allow for meaningful interactions with family. They plan to visit with her great grandchildren as she loves the children and this brings her joy. We discussed minimizing sticks and blood work and allowing comfort feeds. We  discussed comfort care in the hospital or with hospice at [facility] or hospice facility. We will continue to monitor and discuss over the next 24-48 hours and make decisions based on [name]'s progress. Discussed poor prognosis as likely days to weeks if she continues on current trajectory. They had many good questions.   Will focus on what is important to [name]: her comfort, family, and her faith. She does have some underlying paranoia due to her underlying cognitive impairment. Spiritual care consulted for additional support.   All questions/concerns addressed. Emotional support provided.   Primary Decision Maker NEXT OF KIN daughter [name]    SUMMARY OF RECOMMENDATIONS   - Continue antibiotics - Comfort medications as needed - Anticipate transition to full comfort in coming days  Code Status/Advance Care Planning: DNR   Symptom Management:  PRN medications added to ensure comfort. Scheduled Tylenol.   Palliative Prophylaxis:  Aspiration, Bowel Regimen, Delirium Protocol, Frequent Pain Assessment, and Turn Reposition  Psycho-social/Spiritual:  Desire for further Chaplaincy support:yes Additional Recommendations: Education on Hospice and Grief/Bereavement Support  Prognosis:  Days to weeks most likely.   Discharge Planning: To Be Determined      Primary Diagnoses: Present on Admission:  PAROXYSMAL ATRIAL FIBRILLATION  Third degree AV block  S/p PPM  GERD (gastroesophageal reflux disease)  Chronic kidney disease, stage 3a (HCC)  Cognitive impairment  Chronic diastolic CHF (congestive heart failure) (HCC)  Slow transit constipation  (Resolved) Acute appendicitis  Mixed incontinence urge and stress  Depression, recurrent ([place])  Hypothyroidism   I have reviewed the medical record, interviewed the patient and family, and examined the patient. The following aspects are pertinent.  Past Medical History:  Diagnosis Date   Arthritis    Atrial  fibrillation (HCC)    Cataract    removed bilateral   Chronic diastolic CHF (congestive heart failure) ([place]) 06/02/2020   Clotting disorder (HCC)    h/o blood clot left leg   Dementia (HCC)    Depression    DVT (deep venous thrombosis) ([place]) 2011   GERD (gastroesophageal reflux disease)    HB (heart block)    s/p PPM, most recent generator change 12/11 by JA   Heart murmur    Hiatal hernia    Hyperlipidemia    Hypertension    Hypothyroidism    Osteoporosis    PTE (post-transplant erythrocytosis)    Pulmonary embolism ([place]) 2011   on coumadin   UTI (lower urinary tract infection)    Social History   Socioeconomic History   Marital status: Married    Spouse name: Not on file   Number of children: 2   Years of education: Not on file   Highest education level: Not on file  Occupational History   Occupation: retired Radiation protection practitioner  Tobacco Use   Smoking status: Never   Smokeless tobacco: Never  Vaping Use   Vaping Use: Never used  Substance and Sexual Activity   Alcohol use: No   Drug use: No   Sexual activity: Not Currently  Other Topics Concern   Not on file  Social History Narrative   Tobacco use, amount per day now: 0      Past tobacco use, amount per day: 0      How many years did you use tobacco: 0      Alcohol use (drinks per week): 0      Diet:      Do you drink/eat things with caffeine? Yes      Marital status: Married            What year were you married? 1946      Do you live in a house, apartment, assisted living, condo, trailer? Apartment       Is it one or more stories? 1      How many persons live in your home? 2      Do you have any pets in your home? 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 Yes            How often? 3-4 times a week       Do you have a living will? Yes      Do you have a DNR form?            If not, do you want to discuss one?      Do you have signed POA/HPOA forms? Yes              Social Determinants of Health   Financial Resource Strain: Not on file  Food Insecurity: Not on file  Transportation Needs: Not on file  Physical Activity: Not on file  Stress: Not on file  Social Connections: Not on file   Family History  Problem Relation Age of Onset   Stroke Father    Heart disease Father    Arthritis Father    Heart disease Mother    Arthritis Mother    Stomach cancer Brother    Stomach cancer Brother    Liver cancer Brother    Breast cancer Sister    Colon cancer Neg Hx    Colon polyps Neg Hx    Esophageal cancer Neg Hx    Rectal cancer Neg Hx    Scheduled Meds:   HYDROmorphone (DILAUDID) injection  0.5 mg Intravenous Once   lip balm  1 application. Topical BID   pantoprazole (PROTONIX) IV  40 mg Intravenous Q24H   Continuous Infusions:  albumin human     dextrose 5 % and 0.9 % NaCl with KCl 20 mEq/L 60 mL/hr at 07/18/21 1500   methocarbamol (ROBAXIN) IV     ondansetron (ZOFRAN) IV     piperacillin-tazobactam (ZOSYN)  IV 12.5 mL/hr at 07/18/21 1500   PRN Meds:.acetaminophen, acetaminophen, albumin human, alum & mag hydroxide-simeth, bisacodyl, fentaNYL (SUBLIMAZE) injection, magic mouthwash, menthol-cetylpyridinium, methocarbamol (ROBAXIN) IV, metoprolol tartrate, ondansetron (ZOFRAN) IV **OR** ondansetron (ZOFRAN) IV, phenol, prochlorperazine, simethicone Allergies  Allergen Reactions   Hydrocodone Other (See Comments)    Dizziness & jerking   Alendronate Other (See Comments)    Can not tolerate   Celebrex [Celecoxib] Other (See Comments)    Can not tolerate   Meloxicam Other (See Comments)    Avoid    Myrbetriq [Mirabegron] Other (See Comments)    Can not tolerate   Pradaxa [Dabigatran Etexilate Mesylate] Other (See Comments)    Can not tolerate   Toviaz [Fesoterodine Fumarate Er] Other (See Comments)    Can not tolerate   Vesicare [Solifenacin] Other (See Comments)    Can not tolerate   Review of Systems  Unable to perform ROS:  Dementia   Physical Exam Vitals and nursing note reviewed.  Constitutional:      General: She is not in acute distress.    Appearance: She is ill-appearing.  Cardiovascular:     Rate and Rhythm: Tachycardia present.  Pulmonary:     Effort: No tachypnea, accessory muscle usage or respiratory distress.  Abdominal:     Tenderness: There is abdominal tenderness.  Neurological:     Mental Status: She is alert. She is confused.     Comments: Forgetful    Vital Signs: BP (!) 147/86 (BP Location: Left Arm)    Pulse 100    Temp 98.2 F (36.8 C) (Oral)    Resp 16    Ht 5' (1.524 m)    Wt 59.4 kg    SpO2 98%    BMI 25.58 kg/m  Pain Scale: Faces POSS *See Group Information*: 1-Acceptable,Awake and alert Pain Score: 3    SpO2: SpO2: 98 % O2 Device:SpO2: 98 % O2 Flow Rate: .O2 Flow Rate (L/min): 2 L/min  IO: Intake/output summary:  Intake/Output Summary (Last 24 hours) at 07/18/2021 1508 Last data filed at 07/18/2021 1500 Gross per 24 hour  Intake 2395.48 ml  Output 150 ml  Net 2245.48 ml    LBM: Last BM Date : 07/17/21 Baseline Weight: Weight: 59.4 kg Most recent weight: Weight: 59.4 kg     Palliative Assessment/Data:     Time In: 1500 Time Out: 1630 Time Total: 90 min Greater than 50%  of this time was spent counseling and coordinating care related to the above  assessment and plan.  Signed by: Vinie Sill, NP Palliative Medicine Team Pager # [phone] (M-F 8a-5p) Team Phone # [phone] (Nights/Weekends)

## 2021-07-19 DIAGNOSIS — Z7189 Other specified counseling: Secondary | ICD-10-CM | POA: Diagnosis not present

## 2021-07-19 DIAGNOSIS — K3532 Acute appendicitis with perforation and localized peritonitis, without abscess: Secondary | ICD-10-CM | POA: Diagnosis not present

## 2021-07-19 DIAGNOSIS — Z515 Encounter for palliative care: Secondary | ICD-10-CM | POA: Diagnosis not present

## 2021-07-19 MED ORDER — HALOPERIDOL LACTATE 5 MG/ML IJ SOLN: 0.5000 mg | INTRAMUSCULAR | Status: DC | PRN

## 2021-07-19 MED ORDER — ALPRAZOLAM 0.25 MG PO TABS: 0.2500 mg | ORAL_TABLET | Freq: Three times a day (TID) | ORAL | Status: DC | PRN

## 2021-07-19 MED ORDER — HALOPERIDOL LACTATE 2 MG/ML PO CONC
0.5000 mg | ORAL | Status: DC | PRN
  Filled 2021-07-19: qty 0.3

## 2021-07-19 MED ORDER — POLYVINYL ALCOHOL 1.4 % OP SOLN
1.0000 [drp] | Freq: Four times a day (QID) | OPHTHALMIC | Status: DC | PRN
  Filled 2021-07-19: qty 15

## 2021-07-19 MED ORDER — MORPHINE SULFATE (CONCENTRATE) 10 MG/0.5ML PO SOLN
5.0000 mg | ORAL | Status: DC | PRN
  Administered 2021-07-20: 15:00:00 2 mg via ORAL
  Filled 2021-07-19: qty 0.5

## 2021-07-19 MED ORDER — MORPHINE SULFATE (CONCENTRATE) 10 MG/0.5ML PO SOLN
5.0000 mg | Freq: Four times a day (QID) | ORAL | Status: DC
  Administered 2021-07-19 – 2021-07-20 (×3): 5 mg via SUBLINGUAL
  Filled 2021-07-19 (×3): qty 0.5

## 2021-07-19 MED ORDER — MORPHINE SULFATE (PF) 2 MG/ML IV SOLN
1.0000 mg | INTRAVENOUS | Status: DC | PRN
  Administered 2021-07-20 (×2): 2 mg via INTRAVENOUS
  Filled 2021-07-19 (×2): qty 1

## 2021-07-19 MED ORDER — HALOPERIDOL 0.5 MG PO TABS
0.5000 mg | ORAL_TABLET | ORAL | Status: DC | PRN
  Filled 2021-07-19: qty 1

## 2021-07-19 MED ORDER — BIOTENE DRY MOUTH MT LIQD: 15.0000 mL | OROMUCOSAL | Status: DC | PRN

## 2021-07-19 MED ORDER — MORPHINE SULFATE (PF) 2 MG/ML IV SOLN
1.0000 mg | INTRAVENOUS | Status: DC | PRN
  Administered 2021-07-19: 2 mg via INTRAVENOUS
  Filled 2021-07-19: qty 1

## 2021-07-19 NOTE — Progress Notes (Signed)
[facility] ?PROGRESS NOTE ? ?Patient: [name] PPI:[id number]   ?PCP: [name], MD DOB: November 22, 1922   ?DOA: 07/25/2021   DOS: 07/19/2021   ? ?Subjective: Patient was comfortable.  No acute complaint no nausea no vomiting.  Family at bedside with multiple questions. ? ?Objective:  ?Vitals:  ? 07/19/21 0557 07/19/21 1406  ?BP: (!) 145/85 139/72  ?Pulse: (!) 101 90  ?Resp: 16 16  ?Temp: 97.9 ?F (36.6 ?C) 98.6 ?F (37 ?C)  ?SpO2: 96% 97%  ?  ?Clear to auscultation. ?S1-S2 present. ?Bowel sounds sluggish. ?Abdomen distended and tender. ? ?Assessment and plan: ?Perforated viscus. ?Currently general surgery following but the plan is for DNR and full comfort care. ?Concern is that the patient most likely will end up having hospital death given her ongoing symptoms. ?Family has visitors expected to see the patient tonight therefore continuing IV antibiotics and IV fluid for now. ?All questions for family answered. ? ?Author: ?Berle Mull, MD ?[facility] ?07/19/2021 6:22 PM   ?If 7PM-7AM, please contact night-coverage at [email or web address]  ?

## 2021-07-19 NOTE — Progress Notes (Signed)
Palliative: ? ?HPI: 88 y.o. female  with past medical history of dementia, anemia, hypothyroidism, diastolic heart failure, atrial fibrillation on warfarin, HTN, HLD, heart block s/p PPM, CKD stage 3a admitted on 07/20/2021 with abdominal pain due to acute perforated appendicitis.  ? ?I met today at Ms. Ruest's bedside with daughter, Hassan Rowan. Discussed with RN and Claiborne Billings, PA prior to visit. Ms. Wetherell sleeps soundly and comfortably in recliner throughout my visit. Hassan Rowan and I discussed increased pain and agitation overnight as a sign of progression. We discussed need for increased medication to ensure comfort and will schedule low dose Roxanol with PRN medication as needed. Hassan Rowan is hopeful that we can keep her comfortable but that she will continue to be able to awaken when family comes back to visit today. We discussed limited benefits of antibiotics and have decided to stop them and focus on comfort care. We will continue IV fluids throughout the day to give Ms. Seltzer increased opportunity for time with her family and will d/c IV fluids this evening. Hassan Rowan would like to reassess tomorrow after transition to comfort care and see if it makes sense to consider transition to hospice. We reviewed signs of progression at end of life and fluctuating mentation is expected with ultimate expectation that she will be sleeping all the time. Prognosis poor days to a week most likely.  ? ?All questions/concerns addressed. Emotional support provided.  ? ?Exam: Sleeping comfortable in recliner. Breathing slightly more labored but regular. Abd distended and noted to be more tender by RN and PA so I did not palpate. Warm to touch. Color is good.  ? ?Plan: ?- DNR.  ?- Full comfort care (IVF to d/c tonight).  ?- Scheduled low dose roxanol with PRN morphine available as needed to achieve comfort.  ?- PRN xanax as needed for anxiety.  ?- Continue comfort feeds.  ? ?55 min ? ?Vinie Sill, NP ?Palliative Medicine Team ?Pager  [phone] (Please see [email or web address] for schedule) ?Team Phone [phone]  ? ? ?Greater than 50%  of this time was spent counseling and coordinating care related to the above assessment and plan   ?

## 2021-07-19 NOTE — Progress Notes (Signed)
Occupational Therapy Discharge ?Patient Details ?Name: [name] ?MRN: [id number] ?DOB: 05-27-1922 ?Today's Date: 07/19/2021 ?Time:  -  ?  ? ?Patient discharged from OT services secondary to medical decline - will need to re-order OT to resume therapy services. Per chart review pt/family deciding against sx with discussions of keeping patient comfortable and possible hospice services.  ? ?Please see latest therapy progress note for current level of functioning and progress toward goals.   ? ?Progress and discharge plan discussed with patient and/or caregiver: Patient unable to participate in discharge planning and no caregivers available ? ?Delbert Phenix OT ?OT pager: [phone] ? ?GO ?   ? ?Rosemary Holms ?07/19/2021, 7:22 AM  ?

## 2021-07-19 NOTE — Progress Notes (Signed)
Bevelyn had many of her lovely children, grandchildren and great grandchildren in the room when chaplain visited.  Chaplain will return at a later time and give them space to have family time now. ? ?[employer], Bcc ?Pager, [phone] ? ?

## 2021-07-19 NOTE — Progress Notes (Signed)
Subjective: Patient up in chair today but seems a bit more SOB and still having a lot of abdominal pain.  No better  ROS: unable due to significantly HOH  Objective: Vital signs in last 24 hours: Temp:  [97.9 F (36.6 C)-98.2 F (36.8 C)] 97.9 F (36.6 C) (03/08 0557) Pulse Rate:  [100-110] 101 (03/08 0557) Resp:  [16-17] 16 (03/08 0557) BP: (145-152)/(85-87) 145/85 (03/08 0557) SpO2:  [93 %-98 %] 96 % (03/08 0557) Last BM Date : 07/17/21  Intake/Output from previous day: 03/07 0701 - 03/08 0700 In: 3028.1 [P.O.:50; I.V.:2118.1; IV Piggyback:860] Out: 1200 [Urine:1200] Intake/Output this shift: No intake/output data recorded.  PE: Heart: regular, + murmur Lungs: CTAB, but O2 in place and using some accessory muscles to help Abd: still distended and tympanitic, but very painful to palpation, no better.  Lab Results:  Recent Labs    07/17/21 0405 07/18/21 0429  WBC 10.2 8.6  HGB 10.3* 10.7*  HCT 33.1* 34.2*  PLT 163 161   BMET Recent Labs    07/17/21 0405 07/18/21 0429  NA 139 140  K 3.7 3.7  CL 107 110  CO2 25 24  GLUCOSE 121* 139*  BUN 14 12  CREATININE 0.58   0.58 0.60  CALCIUM 8.6* 8.3*   PT/INR Recent Labs    07/17/21 0405 07/18/21 0429  LABPROT 25.3* 22.0*  INR 2.3* 1.9*   CMP     Component Value Date/Time   NA 140 07/18/2021 0429   NA 143 02/14/2021 0000   NA 145 03/19/2017 0000   K 3.7 07/18/2021 0429   K 5.9 03/19/2017 0000   CL 110 07/18/2021 0429   CO2 24 07/18/2021 0429   GLUCOSE 139 (H) 07/18/2021 0429   BUN 12 07/18/2021 0429   BUN 34 (A) 02/14/2021 0000   CREATININE 0.60 07/18/2021 0429   CREATININE 0.95 (H) 04/25/2020 0800   CALCIUM 8.3 (L) 07/18/2021 0429   CALCIUM 9.1 03/19/2017 0000   PROT 6.0 (L) 07/13/2021 1525   PROT 6.7 03/19/2017 0000   ALBUMIN 2.9 (L) 07/18/2021 0429   ALBUMIN 3.7 03/19/2017 0000   AST 13 (L) 07/26/2021 1525   AST 22 03/19/2017 0000   ALT 8 07/18/2021 1525   ALT 38 03/19/2017 0000    ALKPHOS 79 08/03/2021 1525   ALKPHOS 120 03/19/2017 0000   BILITOT 0.4 07/31/2021 1525   BILITOT 0.3 03/19/2017 0000   GFRNONAA >60 07/18/2021 0429   GFRNONAA 51 (L) 04/25/2020 0800   GFRAA 59 (L) 04/25/2020 0800   Lipase     Component Value Date/Time   LIPASE 44 08/10/2021 1525       Studies/Results: DG Abd Decub  Result Date: 07/18/2021 CLINICAL DATA:  Abdominal distension, acute perforated appendicitis EXAM: ABDOMEN - 1 VIEW DECUBITUS COMPARISON:  07/18/2021 FINDINGS: Small amount of pneumoperitoneum along the lateral margin of the liver. No bowel dilatation to suggest obstruction. No pathologic calcifications along the expected course of the ureters. No acute osseous abnormality. IMPRESSION: 1. Small amount of pneumoperitoneum along the lateral margin of the liver. In the absence of recent instrumentation or surgery, recommend a follow-up CT of the abdomen. Electronically Signed   By: Kathreen Devoid M.D.   On: 07/18/2021 09:43   DG Abd Portable 1V  Result Date: 07/18/2021 CLINICAL DATA:  Acute appendicitis.  Right lower quadrant pain. EXAM: PORTABLE ABDOMEN - 1 VIEW COMPARISON:  CT abdomen pelvis 08/08/2021. FINDINGS: Bowel gas pattern is unremarkable. Progressive free air is  suggested. Decubitus imaging could be used for further evaluation to confirm the free air. IMPRESSION: Progressive free air is suggested. Decubitus imaging could be used for further evaluation to confirm the free air. Critical Value/emergent results were called by telephone at the time of interpretation on 07/18/2021 at 8:47 am to provider [facility] , who verbally acknowledged these results. Electronically Signed   By: San Morelle M.D.   On: 07/18/2021 09:02   ECHOCARDIOGRAM COMPLETE  Result Date: 07/17/2021    ECHOCARDIOGRAM REPORT   Patient Name:   [name] Date of Exam: 07/17/2021 Medical Rec #:  [id number]          Height:       60.0 in Accession #:    [id number]         Weight:       131.0 lb  Date of Birth:  Feb 21, 1923         BSA:          1.559 m Patient Age:    88 years           BP:           117/60 mmHg Patient Gender: F                  HR:           90 bpm. Exam Location:  Inpatient Procedure: 2D Echo Indications:    Other abnormalities of the heart  History:        Patient has prior history of Echocardiogram examinations, most                 recent 03/09/2015. CHF; Arrythmias:Atrial Fibrillation.  Sonographer:    Jefferey Pica Referring Phys: [id number] TAYE T GONFA IMPRESSIONS  1. Left ventricular ejection fraction, by estimation, is 50%. The left ventricle has mildly decreased function. The left ventricle demonstrates global hypokinesis. There is moderate concentric left ventricular hypertrophy. Left ventricular diastolic parameters are consistent with Grade I diastolic dysfunction (impaired relaxation).  2. Right ventricular systolic function is normal. The right ventricular size is normal. There is moderately elevated pulmonary artery systolic pressure. The estimated right ventricular systolic pressure is 62.2 mmHg.  3. The mitral valve is degenerative. Mild mitral valve regurgitation. Moderate mitral stenosis. The mean mitral valve gradient is 7.0 mmHg. Moderate mitral annular and valvular calcification with restricted motion. MVA 1.43 cm^2 by VTI.  4. The aortic valve is tricuspid. There is severe calcifcation of the aortic valve. Aortic valve regurgitation is trivial. Severe aortic valve stenosis. Aortic valve area, by VTI measures 0.55 cm. Aortic valve mean gradient measures 42.0 mmHg.  5. Left atrial size was mildly dilated.  6. The inferior vena cava is normal in size with <50% respiratory variability, suggesting right atrial pressure of 8 mmHg. FINDINGS  Left Ventricle: Left ventricular ejection fraction, by estimation, is 50%. The left ventricle has mildly decreased function. The left ventricle demonstrates global hypokinesis. The left ventricular internal cavity size was normal in  size. There is moderate concentric left ventricular hypertrophy. Left ventricular diastolic parameters are consistent with Grade I diastolic dysfunction (impaired relaxation). Right Ventricle: The right ventricular size is normal. No increase in right ventricular wall thickness. Right ventricular systolic function is normal. There is moderately elevated pulmonary artery systolic pressure. The tricuspid regurgitant velocity is 3.30 m/s, and with an assumed right atrial pressure of 8 mmHg, the estimated right ventricular systolic pressure is 63.3 mmHg. Left Atrium: Left atrial size was mildly dilated.  Right Atrium: Right atrial size was normal in size. Pericardium: Trivial pericardial effusion is present. Mitral Valve: The mitral valve is degenerative in appearance. There is moderate calcification of the mitral valve leaflet(s). Moderate mitral annular calcification. Mild mitral valve regurgitation. Moderate mitral valve stenosis. MV peak gradient, 15.6 mmHg. The mean mitral valve gradient is 7.0 mmHg. Tricuspid Valve: The tricuspid valve is normal in structure. Tricuspid valve regurgitation is mild. Aortic Valve: The aortic valve is tricuspid. There is severe calcifcation of the aortic valve. Aortic valve regurgitation is trivial. Severe aortic stenosis is present. Aortic valve mean gradient measures 42.0 mmHg. Aortic valve peak gradient measures 62.7 mmHg. Aortic valve area, by VTI measures 0.55 cm. Pulmonic Valve: The pulmonic valve was normal in structure. Pulmonic valve regurgitation is trivial. Aorta: The aortic root is normal in size and structure. Venous: The inferior vena cava is normal in size with less than 50% respiratory variability, suggesting right atrial pressure of 8 mmHg. IAS/Shunts: No atrial level shunt detected by color flow Doppler. Additional Comments: A device lead is visualized in the right ventricle.  LEFT VENTRICLE PLAX 2D LVIDd:         4.10 cm   Diastology LVIDs:         2.80 cm   LV e'  medial:    3.44 cm/s LV PW:         1.30 cm   LV E/e' medial:  33.1 LV IVS:        1.30 cm   LV e' lateral:   5.12 cm/s LVOT diam:     1.80 cm   LV E/e' lateral: 22.3 LV SV:         49 LV SV Index:   31 LVOT Area:     2.54 cm  RIGHT VENTRICLE             IVC RV S prime:     10.90 cm/s  IVC diam: 1.90 cm TAPSE (M-mode): 1.7 cm LEFT ATRIUM             Index        RIGHT ATRIUM           Index LA diam:        3.80 cm 2.44 cm/m   RA Area:     11.90 cm LA Vol (A2C):   56.8 ml 36.42 ml/m  RA Volume:   27.90 ml  17.89 ml/m LA Vol (A4C):   38.1 ml 24.43 ml/m LA Biplane Vol: 47.6 ml 30.52 ml/m  AORTIC VALVE                     PULMONIC VALVE AV Area (Vmax):    0.61 cm      PV Vmax:       0.87 m/s AV Area (Vmean):   0.51 cm      PV Peak grad:  3.1 mmHg AV Area (VTI):     0.55 cm AV Vmax:           396.00 cm/s AV Vmean:          284.200 cm/s AV VTI:            0.895 m AV Peak Grad:      62.7 mmHg AV Mean Grad:      42.0 mmHg LVOT Vmax:         95.00 cm/s LVOT Vmean:        56.700 cm/s LVOT VTI:  0.193 m LVOT/AV VTI ratio: 0.22  AORTA Ao Root diam: 2.90 cm Ao Asc diam:  3.20 cm MITRAL VALVE                TRICUSPID VALVE MV Area (PHT): 4.04 cm     TR Peak grad:   43.6 mmHg MV Area VTI:   1.28 cm     TR Vmax:        330.00 cm/s MV Peak grad:  15.6 mmHg MV Mean grad:  7.0 mmHg     SHUNTS MV Vmax:       1.98 m/s     Systemic VTI:  0.19 m MV Vmean:      122.5 cm/s   Systemic Diam: 1.80 cm MV Decel Time: 188 msec MR Peak grad: 142.6 mmHg MR Vmax:      597.00 cm/s MV E velocity: 114.00 cm/s MV A velocity: 161.00 cm/s MV E/A ratio:  0.71 Dalton McleanMD Electronically signed by Franki Monte Signature Date/Time: 07/17/2021/4:55:02 PM    Final     Anti-infectives: Anti-infectives (From admission, onward)    Start     Dose/Rate Route Frequency Ordered Stop   07/17/21 0200  piperacillin-tazobactam (ZOSYN) IVPB 3.375 g        3.375 g 12.5 mL/hr over 240 Minutes Intravenous Every 8 hours 08/08/2021 1733 07/22/21  0159   08/10/2021 1815  piperacillin-tazobactam (ZOSYN) IVPB 3.375 g        3.375 g 100 mL/hr over 30 Minutes Intravenous  Once 08/02/2021 1802 07/23/2021 1902   07/25/2021 1730  metroNIDAZOLE (FLAGYL) IVPB 500 mg  Status:  Discontinued        500 mg 100 mL/hr over 60 Minutes Intravenous  Once 07/14/2021 1725 08/09/2021 1733   07/22/2021 1715  cefTRIAXone (ROCEPHIN) 1 g in sodium chloride 0.9 % 100 mL IVPB  Status:  Discontinued        1 g 200 mL/hr over 30 Minutes Intravenous  Once 07/13/2021 1709 08/11/2021 1725   07/29/2021 1645  cefTRIAXone (ROCEPHIN) 1 g in sodium chloride 0.9 % 100 mL IVPB  Status:  Discontinued        1 g 200 mL/hr over 30 Minutes Intravenous  Once 07/15/2021 1640 08/02/2021 1730        Assessment/Plan Acute perforated appendicitis -overall worsening despite abx therapy -decision made today to transition to full comfort care, which is appropriate as patient doesn't seem to be responding to current conservative therapies -greatly appreciate palliative care assistance with this patient.  FEN - CLD for comfort/IVFs VTE - on hold ID - zosyn, likely to stop   Severe aortic stenosis Moderate mitral stenosis H/O A fib Diastolic HF H/O DVT/PE HTN HLD  I reviewed nursing notes, hospitalist notes, last 24 h vitals and pain scores, last 24 h labs and trends, and last 24 h imaging results.   LOS: 3 days    Henreitta Cea , [facility] 07/19/2021, 10:14 AM Please see Amion for pager number during day hours 7:00am-4:30pm or 7:00am -11:30am on weekends

## 2021-07-20 DIAGNOSIS — K3532 Acute appendicitis with perforation and localized peritonitis, without abscess: Secondary | ICD-10-CM | POA: Diagnosis not present

## 2021-07-20 DIAGNOSIS — Z515 Encounter for palliative care: Secondary | ICD-10-CM | POA: Diagnosis not present

## 2021-07-20 MED ORDER — MORPHINE SULFATE (CONCENTRATE) 10 MG/0.5ML PO SOLN
5.0000 mg | ORAL | Status: DC
  Administered 2021-07-20 – 2021-07-21 (×4): 5 mg via SUBLINGUAL
  Filled 2021-07-20 (×4): qty 0.5

## 2021-07-20 MED ORDER — MORPHINE 100MG IN NS 100ML (1MG/ML) PREMIX INFUSION
6.0000 mg/h | INTRAVENOUS | Status: DC
  Administered 2021-07-21 – 2021-07-23 (×4): 5 mg/h via INTRAVENOUS
  Administered 2021-07-24: 6 mg/h via INTRAVENOUS
  Filled 2021-07-20 (×9): qty 100

## 2021-07-20 NOTE — TOC Progression Note (Signed)
Transition of Care (TOC) - Progression Note  ? ? ?Patient Details  ?Name: [name] ?MRN: [id number] ?Date of Birth: May 20, 1922 ? ?Transition of Care (TOC) CM/SW Contact  ?Emanie Behan, LCSW ?Phone Number: ?07/20/2021, 10:35 AM ? ?Clinical Narrative:    ? ?Received TOC order to assist pt/ family with placement into residential Hospice facility.  Daughter confirms preference would be [facility]/ [place] referral.  Have placed referral with [facility] (contact, Roselee Nova), however, no bed available today.  They will keep pt/family/ tx team notified on bed availability. ? ?Expected Discharge Plan: Assisted Living ?Barriers to Discharge: Continued Medical Work up ? ?Expected Discharge Plan and Services ?Expected Discharge Plan: Assisted Living ?In-house Referral: Clinical Social Work ?  ?  ?Living arrangements for the past 2 months: [place] ([place]) ?                ?  ?  ?  ?  ?  ?  ?  ?  ?  ?  ? ? ?Social Determinants of Health (SDOH) Interventions ?  ? ?Readmission Risk Interventions ?No flowsheet data found. ? ?

## 2021-07-20 NOTE — Progress Notes (Signed)
? ? ?   ?Subjective: ?Comfortable but sleepy after more pain meds.  Having some more pain and around her diaphragm today per daughter. ? ?ROS: unable due to significantly HOH ? ?Objective: ?Vital signs in last 24 hours: ?Temp:  [98.6 ?F (37 ?C)] 98.6 ?F (37 ?C) (03/08 1406) ?Pulse Rate:  [90] 90 (03/08 1406) ?Resp:  [16] 16 (03/08 1406) ?BP: (139)/(72) 139/72 (03/08 1406) ?SpO2:  [97 %] 97 % (03/08 1406) ?Last BM Date : 07/17/21 ? ?Intake/Output from previous day: ?03/08 0701 - 03/09 0700 ?In: 589 [I.V.:589] ?Out: 750 [Urine:750] ?Intake/Output this shift: ?No intake/output data recorded. ? ?PE: ?Heart: regular, + murmur ?Abd: still distended seems slightly softer today, but very painful to palpation, no better. ? ?Lab Results:  ?Recent Labs  ?  07/18/21 ?0429  ?WBC 8.6  ?HGB 10.7*  ?HCT 34.2*  ?PLT 161  ? ?BMET ?Recent Labs  ?  07/18/21 ?0429  ?NA 140  ?K 3.7  ?CL 110  ?CO2 24  ?GLUCOSE 139*  ?BUN 12  ?CREATININE 0.60  ?CALCIUM 8.3*  ? ?PT/INR ?Recent Labs  ?  07/18/21 ?0429  ?LABPROT 22.0*  ?INR 1.9*  ? ?CMP  ?   ?Component Value Date/Time  ? NA 140 07/18/2021 0429  ? NA 143 02/14/2021 0000  ? NA 145 03/19/2017 0000  ? K 3.7 07/18/2021 0429  ? K 5.9 03/19/2017 0000  ? CL 110 07/18/2021 0429  ? CO2 24 07/18/2021 0429  ? GLUCOSE 139 (H) 07/18/2021 0429  ? BUN 12 07/18/2021 0429  ? BUN 34 (A) 02/14/2021 0000  ? CREATININE 0.60 07/18/2021 0429  ? CREATININE 0.95 (H) 04/25/2020 0800  ? CALCIUM 8.3 (L) 07/18/2021 0429  ? CALCIUM 9.1 03/19/2017 0000  ? PROT 6.0 (L) 07/29/2021 1525  ? PROT 6.7 03/19/2017 0000  ? ALBUMIN 2.9 (L) 07/18/2021 0429  ? ALBUMIN 3.7 03/19/2017 0000  ? AST 13 (L) 07/14/2021 1525  ? AST 22 03/19/2017 0000  ? ALT 8 07/15/2021 1525  ? ALT 38 03/19/2017 0000  ? ALKPHOS 79 07/25/2021 1525  ? ALKPHOS 120 03/19/2017 0000  ? BILITOT 0.4 08/02/2021 1525  ? BILITOT 0.3 03/19/2017 0000  ? GFRNONAA >60 07/18/2021 0429  ? GFRNONAA 51 (L) 04/25/2020 0800  ? GFRAA 59 (L) 04/25/2020 0800  ? ?Lipase  ?    ?Component Value Date/Time  ? LIPASE 44 08/08/2021 1525  ? ? ? ? ? ?Studies/Results: ?DG Abd Decub ? ?Result Date: 07/18/2021 ?CLINICAL DATA:  Abdominal distension, acute perforated appendicitis EXAM: ABDOMEN - 1 VIEW DECUBITUS COMPARISON:  07/18/2021 FINDINGS: Small amount of pneumoperitoneum along the lateral margin of the liver. No bowel dilatation to suggest obstruction. No pathologic calcifications along the expected course of the ureters. No acute osseous abnormality. IMPRESSION: 1. Small amount of pneumoperitoneum along the lateral margin of the liver. In the absence of recent instrumentation or surgery, recommend a follow-up CT of the abdomen. Electronically Signed   By: Kathreen Devoid M.D.   On: 07/18/2021 09:43   ? ?Anti-infectives: ?Anti-infectives (From admission, onward)  ? ? Start     Dose/Rate Route Frequency Ordered Stop  ? 07/17/21 0200  piperacillin-tazobactam (ZOSYN) IVPB 3.375 g  Status:  Discontinued       ? 3.375 g ?12.5 mL/hr over 240 Minutes Intravenous Every 8 hours 07/12/2021 1733 07/19/21 1021  ? 07/25/2021 1815  piperacillin-tazobactam (ZOSYN) IVPB 3.375 g       ? 3.375 g ?100 mL/hr over 30 Minutes Intravenous  Once 08/08/2021 1802  08/11/2021 1902  ? 07/29/2021 1730  metroNIDAZOLE (FLAGYL) IVPB 500 mg  Status:  Discontinued       ? 500 mg ?100 mL/hr over 60 Minutes Intravenous  Once 07/28/2021 1725 08/10/2021 1733  ? 07/25/2021 1715  cefTRIAXone (ROCEPHIN) 1 g in sodium chloride 0.9 % 100 mL IVPB  Status:  Discontinued       ? 1 g ?200 mL/hr over 30 Minutes Intravenous  Once 08/11/2021 1709 08/11/2021 1725  ? 07/19/2021 1645  cefTRIAXone (ROCEPHIN) 1 g in sodium chloride 0.9 % 100 mL IVPB  Status:  Discontinued       ? 1 g ?200 mL/hr over 30 Minutes Intravenous  Once 08/04/2021 1640 07/14/2021 1730  ? ?  ? ? ? ?Assessment/Plan ?Acute perforated appendicitis ?-comfort care approach at this point ?-no surgical plans ?-palliative to discuss today possibility of residential hospice likely as patient does not appear to be  actively dying at this time. ? ?FEN - CLD for comfort/IVFs ?VTE - on hold ?ID - zosyn, stopped 3/8 ? ?Severe aortic stenosis ?Moderate mitral stenosis ?H/O A fib ?Diastolic HF ?H/O DVT/PE ?HTN ?HLD ? ?I reviewed nursing notes, hospitalist notes, last 24 h vitals and pain scores, last 24 h labs and trends, and last 24 h imaging results. ? ? LOS: 4 days  ? ? ?Henreitta Cea , PA-C ?[facility] ?07/20/2021, 8:43 AM ?Please see Amion for pager number during day hours 7:00am-4:30pm or 7:00am -11:30am on weekends ? ?

## 2021-07-20 NOTE — Progress Notes (Signed)
Met with patient at bedside who had two family members with her (daughter and only grand daughter) spoke with patient and family.  Provided emotional and spiritual support.  Will continue to vissit again to speak with grand daughter who appeared slightly emotional about her grandmothers health status.   ? ? ? 07/20/21 0900  ?Clinical Encounter Type  ?Visited With Patient and family together;Health care provider  ?Visit Type Follow-up  ?Referral From Palliative care team  ?Consult/Referral To Chaplain  ?Recommendations Anticipatory Grief  ?Spiritual Encounters  ?Spiritual Needs Prayer;Emotional  ?Stress Factors  ?Patient Stress Factors None identified  ?Family Stress Factors None identified  ? ? ?

## 2021-07-20 NOTE — Care Management Important Message (Signed)
Important Message ? ?Patient Details IM Letter placed in Patients room. ?Name: [name] ?MRN: [id number] ?Date of Birth: 01-02-1923 ? ? ?Medicare Important Message Given:  Yes ? ? ? ? ?Kerin Salen ?07/20/2021, 2:56 PM ?

## 2021-07-20 NOTE — Progress Notes (Signed)
Palliative: ? ?HPI: 88 y.o. female  with past medical history of dementia, anemia, hypothyroidism, diastolic heart failure, atrial fibrillation on warfarin, HTN, HLD, heart block s/p PPM, CKD stage 3a admitted on 07/30/2021 with abdominal pain due to acute perforated appendicitis.  ? ?I met today at Ms. Maclaren's bedside along with her daughter Hassan Rowan and granddaughter. Ms. Grieder is still alert and interactive (although confused and forgetful) and taking in some fluids. She is having progressive and increasing pain. Discussed with family increasing pain regimen and that we have plenty room to grow if she needs more pain medication. She is tolerating medication well so far and still able to interact with her family.  ? ?We discussed plan moving forward. I reviewed with them that her stable vital signs, alertness, and ability to still enjoy small amounts of intake make me feel that she would be stable and could benefit from being at hospice. We discussed the benefits of hospice facility vs hospital vs hospice at [facility]. After discussion Hassan Rowan would like to pursue placement at [facility]. We also discussed that at any point in time that she progresses closer to end of life we can always cancel transfer if it does not make sense. They agree with plans.  ? ?All questions/concerns addressed. Emotional support provided.  ? ?Exam: Alert, forgetful/confused. No distress. Sleepy. Breathing regular, unlabored. Abd tender.  ? ?Plan: ?- Increase Roxanol to 5 mg every 4 hours scheduled. PRN dosage available as needed.  ?- Full comfort care.  ?- Awaiting transition to hospice.  ? ?35 min ? ?Vinie Sill, NP ?Palliative Medicine Team ?Pager [phone] (Please see [email or web address] for schedule) ?Team Phone [phone]  ? ? ?Greater than 50%  of this time was spent counseling and coordinating care related to the above assessment and plan   ?

## 2021-07-20 NOTE — Progress Notes (Addendum)
Manufacturing [employer] [facility]) Hospital Liaison Note ? ?Referral received for patient/family interest in [facility]. Met with patient and granddaughter at the bedside. Interest confirmed. Chart under review by [facility] physician.  ? ?Hospice eligibility confirmed.  ? ?Unfortunately, [facility] is unable to offer a room today. Hospital Liaison will follow up tomorrow or sooner if a room becomes available. Please do not hesitate to call with questions.   ? ?Thank you for the opportunity to participate in this patient's care. ? ?Buck Mam ?[facility] Liaison ?[phone] ? ? ?Please call with any questions or concerns. Thank you ? ? ?Roselee Nova, LCSW ?[facility] Liaison ?[phone] ?

## 2021-07-20 NOTE — Progress Notes (Signed)
?Progress Note ? ? ?Patient: [name] KVQ:[id number] DOB: December 26, 1922 DOA: 07/27/2021     Hospitalization day: 4 ?DOS: the patient was seen and examined on 07/20/2021 ?  ?Brief hospital course: ?88 y.o. female with PMH of dementia, diastolic CHF, A-fib/DVT on warfarin, AVB/PPM, CKD-3A, anemia and hypothyroidism presenting with bilateral lower quadrant pain, right> left for about a week that has gotten worse 24 hours prior to presentation, and admitted for acute perforated but contained appendicitis.  General surgery consulted.  Started on IV Zosyn. ? ?Patient's abdominal pain seems to be worsening.  Now with more distention.  TTE with LVEF of 50%, severe aortic valve stenosis and moderate mitral valve stenosis.  Patient is high risk surgery.  General surgery discussed treatment options with patient's daughter who is to discuss with the rest of the family before making decision about pursuing surgery or changing to comfort care.  Palliative care consulted. ?3/9, currently awaiting bed placement at [facility].  Continue comfort care. ? ?Assessment and Plan: ?* Acute perforated appendicitis ?Presents with abdominal pain for a week. Worse the night before presentation.  Exam was RLQ tenderness.  CT A/P concerning for acute appendicitis with contained microperforation.   ?General surgery-high risk for surgery. ?Family decided to transition to complete comfort. ? ?Goals of care, counseling/discussion ?Palliative care consulted. ?Patient is too high risk for surgery. ?With ongoing symptom issues, family decided present to complete comfort. ?Transfer to residential hospice. ? ?Chronic diastolic CHF (congestive heart failure) ([place]) ?Severe aortic stenosis. ?Moderate MS. ?Permanent pacemaker implant. ?Not a surgical candidate for aortic stenosis.  Currently comfort care. ? ?PAROXYSMAL ATRIAL FIBRILLATION ?Rate controlled without medication.  ?Patient was on warfarin. ?Now comfort care. ? ?History of DVT (deep vein  thrombosis) ?Remote but not clear if it is provoked or not ?Now comfort care. ? ?Left shoulder pain ?Resolved.  ? ?E. coli UTI ?Urine culture with pansensitive E. coli but patient without new bladder habit or UTI symptoms.  She has chronic incontinence. ?Treated with IV antibiotics.  Now comfort care. ? ?Protein-calorie malnutrition, moderate ([place]) ?Now comfort care. ? ?Iron deficiency anemia ?Now comfort care. ? ? ?Hypertension ?Blood pressure stable.  Now comfort care. ? ?Chronic kidney disease, stage 3a ([place]) ?Renal function remained stable while the hospital stay.  Now comfort care. ? ? ?Depression, recurrent ([place]) ?Now comfort care. ? ?Hypothyroidism ?Was on Synthroid.  Now comfort care. ? ?Subjective: Pain not well controlled.  Denies any complaint.  No nausea or vomiting. ? ?Physical Exam: ?Vitals:  ? 07/18/21 2135 07/19/21 0557 07/19/21 1406 07/20/21 1248  ?BP: (!) 152/87 (!) 145/85 139/72 (!) 101/54  ?Pulse: (!) 110 (!) 101 90 74  ?Resp: '17 16 16 16  '$ ?Temp: 98 ?F (36.7 ?C) 97.9 ?F (36.6 ?C) 98.6 ?F (37 ?C) (!) 97.3 ?F (36.3 ?C)  ?TempSrc: Oral Oral  Oral  ?SpO2: 93% 96% 97% 97%  ?Weight:      ?Height:      ? ?General: Appear in mild distress; no visible Abnormal Neck Mass Or lumps, Conjunctiva normal ?Cardiovascular: S1 and S2 Present, aortic systolic Murmur, ?Respiratory: good respiratory effort, Bilateral Air entry present and Occasional Crackles, no wheezes ?Abdomen: Bowel Sound present ?Extremities: no Pedal edema ?Neurology: alert and not oriented to time, place, and person ?Gait not checked due to patient safety concerns  ? ?Family Communication: Family at bedside ? ?Disposition: ?Status is: Inpatient ?Remains inpatient appropriate because: Unsafe discharge.  Needing transfer to residential hospice. ?Author: ?Berle Mull, MD ?07/20/2021 8:39 PM ? ?  For on call review [email or web address] ?

## 2021-07-21 DIAGNOSIS — K3532 Acute appendicitis with perforation and localized peritonitis, without abscess: Secondary | ICD-10-CM | POA: Diagnosis not present

## 2021-07-21 DIAGNOSIS — Z515 Encounter for palliative care: Secondary | ICD-10-CM | POA: Diagnosis not present

## 2021-07-21 MED ORDER — MORPHINE BOLUS VIA INFUSION
2.0000 mg | INTRAVENOUS | Status: DC | PRN
  Filled 2021-07-21: qty 4

## 2021-07-21 MED ORDER — LORAZEPAM 2 MG/ML IJ SOLN: 0.5000 mg | Freq: Four times a day (QID) | INTRAMUSCULAR | Status: DC | PRN

## 2021-07-21 MED ORDER — MORPHINE BOLUS VIA INFUSION
2.0000 mg | INTRAVENOUS | Status: DC | PRN
Start: 2021-07-21 — End: 2021-07-21
  Filled 2021-07-21: qty 2

## 2021-07-21 MED ORDER — LORAZEPAM 2 MG/ML IJ SOLN: 1.0000 mg | INTRAMUSCULAR | Status: DC | PRN

## 2021-07-21 MED ORDER — LORAZEPAM 2 MG/ML IJ SOLN
1.0000 mg | Freq: Four times a day (QID) | INTRAMUSCULAR | Status: DC
  Administered 2021-07-21 – 2021-07-24 (×12): 1 mg via INTRAVENOUS
  Filled 2021-07-21 (×12): qty 1

## 2021-07-21 NOTE — Progress Notes (Signed)
Palliative: ? ?HPI: 88 y.o. female  with past medical history of dementia, anemia, hypothyroidism, diastolic heart failure, atrial fibrillation on warfarin, HTN, HLD, heart block s/p PPM, CKD stage 3a admitted on 07/21/2021 with abdominal pain due to acute perforated appendicitis.  ? ?I met today at Ms. Aybar's bedside with her daughter Hassan Rowan, grandson, and multiple other family members at bedside. Ms. Heatherington had progression of her pain overnight and is now on morphine infusion. She appears mostly comfortable although she does have clenched fists and occasional moaning - I discussed with family recommendation to provide anxiety medication in addition to morphine to ensure comfort and they agree. Often moaning at end of life is best relieved with anxiety medication. They agree with comfort recommendations and medications as needed to ensure comfort. They do not want any measures to prolong her in her current state. We discussed plan for hospital death and no plans for transfer from hospital.  ? ?All questions/concerns addressed. Emotional support provided. I discussed plan of care also with RN. We discussed best use of medication for comfort.  ? ?Exam: Unresponsive. Breathing irregular with frequent apneic episodes. Abd soft. Warm to touch. No mottling noted.  ? ?Plan: ?- Full comfort care.  ?- Anticipate hospital death.  ?- Adjusted medications to ensure comfort at end of life.  ? ?35 min ? ?Vinie Sill, NP ?Palliative Medicine Team ?Pager [phone] (Please see [email or web address] for schedule) ?Team Phone [phone]  ? ? ?Greater than 50%  of this time was spent counseling and coordinating care related to the above assessment and plan   ?

## 2021-07-21 NOTE — Progress Notes (Signed)
?Progress Note ? ? ?Patient: [name] [id number]:[id number] DOB: 25-Nov-1922 DOA: 08/01/2021     Hospitalization day: 5 ?DOS: the patient was seen and examined on 07/21/2021 ? ?Brief hospital course: ?88 y.o. female with PMH of dementia, diastolic CHF, A-fib/DVT on warfarin, AVB/PPM, CKD-3A, anemia and hypothyroidism presenting with bilateral lower quadrant pain, right> left for about a week that has gotten worse 24 hours prior to presentation, and admitted for acute perforated but contained appendicitis.  General surgery consulted.  Started on IV Zosyn. ? ?Patient's abdominal pain seems to be worsening.  Now with more distention.  TTE with LVEF of 50%, severe aortic valve stenosis and moderate mitral valve stenosis.  Patient is high risk surgery.  General surgery discussed treatment options with patient's daughter who is to discuss with the rest of the family before making decision about pursuing surgery or changing to comfort care.  Palliative care consulted. ?3/9, currently awaiting bed placement at [facility].  Continue comfort care. ?3/10 started on morphine infusion due to ongoing severe abdominal pain despite scheduled roxanol. Now anticipating hospital death.  ? ?Assessment and Plan: ?* Acute perforated appendicitis ?Presents with abdominal pain for a week. Worse the night before presentation.  Exam was RLQ tenderness.   ?CT A/P concerning for acute appendicitis with contained microperforation. ?General surgery consulted -high risk for surgery. ?Family decided to transition to complete comfort. ? ?Goals of care, counseling/discussion ?Palliative care consulted. ?Patient is too high risk for surgery. ?With ongoing symptom issues, family decided present to complete comfort. ?Pt overnight with severe pain despite scheduled roxanol.  ?Now on morphine infusion, will anticipate hospital death. ? ?Chronic diastolic CHF (congestive heart failure) ([place]) ?Severe aortic stenosis. ?Moderate MS. ?Permanent pacemaker  implant. ?Not a surgical candidate for aortic stenosis.  Currently comfort care. ? ?PAROXYSMAL ATRIAL FIBRILLATION ?Rate controlled without medication.  ?Patient was on warfarin. ?Now comfort care. ? ?History of DVT (deep vein thrombosis) ?Remote but not clear if it is provoked or not ?Now comfort care. ? ?Left shoulder pain ?Resolved.  ? ?E. coli UTI ?Urine culture with pansensitive E. coli  ?She has chronic incontinence. ?Treated with IV antibiotics.  Now comfort care. ? ?Protein-calorie malnutrition, moderate ([place]) ?Now comfort care. ? ?Iron deficiency anemia ?Now comfort care. ? ?Hypertension ?Blood pressure stable.  Now comfort care. ? ?Chronic kidney disease, stage 3a ([place]) ?Renal function remained stable while the hospital stay.  Now comfort care. ? ?Depression, recurrent ([place]) ?Now comfort care. ? ?Hypothyroidism ?Was on Synthroid.  Now comfort care. ? ?Subjective: pain well controlled. Now has some moaning and Occasional grunting. No other acute events.  ? ?Physical Exam: ?Vitals:  ? 07/19/21 0557 07/19/21 1406 07/20/21 1248 07/20/21 2202  ?BP: (!) 145/85 139/72 (!) 101/54 (!) 141/64  ?Pulse: (!) 101 90 74 94  ?Resp: '16 16 16 18  '$ ?Temp: 97.9 ?F (36.6 ?C) 98.6 ?F (37 ?C) (!) 97.3 ?F (36.3 ?C) 97.8 ?F (36.6 ?C)  ?TempSrc: Oral  Oral Oral  ?SpO2: 96% 97% 97% 95%  ?Weight:      ?Height:      ? ?General: Appear in mild distress; no visible Abnormal Neck Mass Or lumps, Conjunctiva normal ?Cardiovascular: S1 and S2 Present, aortic systolic Murmur, ?Respiratory: good respiratory effort, Bilateral Air entry present and CTA, no Crackles, no wheezes ? ?Data Reviewed: ?I have discussed pt's care plan and test results with Palliative care .  ? ?Family Communication: family at bedside ? ?Disposition: ?Status is: Inpatient ?Remains inpatient appropriate because: anticipate hospital death ? ?  Author: ?Berle Mull, MD ?07/21/2021 4:38 PM ? ?For on call review [email or web address] ?

## 2021-07-22 DIAGNOSIS — K3532 Acute appendicitis with perforation and localized peritonitis, without abscess: Secondary | ICD-10-CM | POA: Diagnosis not present

## 2021-07-22 NOTE — Progress Notes (Signed)
?  Progress Note ? ? ?Patient: [name]:[id number] DOB: 1922-10-11 DOA: 07/30/2021     Hospitalization day: 6 ?DOS: the patient was seen and examined on 07/22/2021 ? ?Brief hospital course: ?88 y.o. female with PMH of dementia, diastolic CHF, A-fib/DVT on warfarin, AVB/PPM, CKD-3A, anemia and hypothyroidism presenting with bilateral lower quadrant pain, right> left for about a week that has gotten worse 24 hours prior to presentation, and admitted for acute perforated but contained appendicitis.  General surgery consulted.  Started on IV Zosyn. ? ?Patient's abdominal pain seems to be worsening.  Now with more distention.  TTE with LVEF of 50%, severe aortic valve stenosis and moderate mitral valve stenosis.  Patient is high risk surgery.  General surgery discussed treatment options with patient's daughter who is to discuss with the rest of the family before making decision about pursuing surgery or changing to comfort care.  Palliative care consulted. ?3/9, currently awaiting bed placement at [facility].  Continue comfort care. ?3/10 started on morphine infusion due to ongoing severe abdominal pain despite scheduled roxanol. Now anticipating hospital death.  ? ?Assessment and Plan: ?Comfort care. ?Continue morphine infusion. ?Appears to be comfortable. ?No needs identified so far. ?As needed medications available. ? ?Subjective: No nausea no vomiting.  No moaning or grunting.  Breathing appears comfortable. ? ?Physical Exam: ?Vitals:  ? 07/20/21 1248 07/20/21 2202 07/21/21 2144 07/22/21 1303  ?BP: (!) 101/54 (!) 141/64 (!) 104/57 (!) 106/58  ?Pulse: 74 94 (!) 108 (!) 105  ?Resp: '16 18 16 18  '$ ?Temp: (!) 97.3 ?F (36.3 ?C) 97.8 ?F (36.6 ?C) 97.7 ?F (36.5 ?C) 99.2 ?F (37.3 ?C)  ?TempSrc: Oral Oral Oral Oral  ?SpO2: 97% 95% 92% 95%  ?Weight:      ?Height:      ? ?General: Appear in mild distress; ?Cardiovascular: S1 and S2 Present, aortic systolic Murmur, ?Respiratory: good respiratory effort, Bilateral Air  entry present and CTA, no Crackles, no wheezes ?Neurology: lethargic ? ?Family Communication: Discussed with family at bedside. ? ?Disposition: ?Status is: Inpatient ?Remains inpatient appropriate because: Unsafe discharge.  Anticipating in-hospital death.  Continue comfort care. ? ?Author: ?Berle Mull, MD ?07/22/2021 5:38 PM ? ?For on call review [email or web address] ?

## 2021-07-23 DIAGNOSIS — K3532 Acute appendicitis with perforation and localized peritonitis, without abscess: Secondary | ICD-10-CM | POA: Diagnosis not present

## 2021-07-23 NOTE — Progress Notes (Signed)
?  Progress Note ? ? ?Patient: [name] CBU:[id number] DOB: [birth year] DOA: 07/23/2021     Hospitalization day: 7 ?DOS: the patient was seen and examined on 07/23/2021 ? ?Brief hospital course: ?88 y.o. female with PMH of dementia, diastolic CHF, A-fib/DVT on warfarin, AVB/PPM, CKD-3A, anemia and hypothyroidism presenting with bilateral lower quadrant pain, right> left for about a week that has gotten worse 24 hours prior to presentation, and admitted for acute perforated but contained appendicitis.  General surgery consulted.  Started on IV Zosyn. ? ?Patient's abdominal pain seems to be worsening.  Now with more distention.  TTE with LVEF of 50%, severe aortic valve stenosis and moderate mitral valve stenosis.  Patient is high risk surgery.  General surgery discussed treatment options with patient's daughter who is to discuss with the rest of the family before making decision about pursuing surgery or changing to comfort care.  Palliative care consulted. ?3/9, currently awaiting bed placement at [facility].  Continue comfort care. ?3/10 started on morphine infusion due to ongoing severe abdominal pain despite scheduled roxanol. Now anticipating hospital death.  ? ?Assessment and Plan: ?Comfort care. ?Acute appendicitis with perforation ?Patient is currently on IV morphine infusion. ?Appears to have sinus tachycardia on examination as well as tachypnea. ?We will increase the rate of the infusion from 5 mg to 6 mg/h. ? ?Subjective: No nausea no vomiting.  No agitation.  Increase shortness of breath.  No cough. ? ?Physical Exam: ?Vitals:  ? 07/21/21 2144 07/22/21 1303 07/22/21 2107 07/23/21 1237  ?BP: (!) 104/57 (!) 106/58 122/66 (!) 92/47  ?Pulse: (!) 108 (!) 105 (!) 105 (!) 109  ?Resp: '16 18 14 14  '$ ?Temp: 97.7 ?F (36.5 ?C) 99.2 ?F (37.3 ?C) 98.7 ?F (37.1 ?C) 98.9 ?F (37.2 ?C)  ?TempSrc: Oral Oral Oral   ?SpO2: 92% 95% 94% (!) 87%  ?Weight:      ?Height:      ? ?General: Appear in mild distress; no visible  Abnormal Neck Mass Or lumps, Conjunctiva normal ?Cardiovascular: S1 and S2 Present, no Murmur, ?Respiratory: good respiratory effort, Bilateral Air entry present and  bilateral Crackles, no wheezes ?Abdomen: Bowel Sound present,  ?Neurology: lethargic ? ?Data Reviewed: ?I have discussed pt's care plan and test results with palliative care.  ? ?Family Communication: Family at bedside. ? ?Disposition: ?Status is: Inpatient ?Remains inpatient appropriate because: Anticipating hospital death. ? ?Author: ?Berle Mull, MD ?07/23/2021 6:16 PM ? ?For on call review [email or web address] ?

## 2021-07-24 DIAGNOSIS — K3532 Acute appendicitis with perforation and localized peritonitis, without abscess: Secondary | ICD-10-CM | POA: Diagnosis not present

## 2021-07-24 MED ORDER — GLYCOPYRROLATE 0.2 MG/ML IJ SOLN
0.1000 mg | Freq: Three times a day (TID) | INTRAMUSCULAR | Status: DC
  Administered 2021-07-24: 0.1 mg via INTRAVENOUS
  Filled 2021-07-24 (×2): qty 0.5

## 2021-07-24 MED ORDER — GLYCOPYRROLATE 0.2 MG/ML IJ SOLN: 0.1000 mg | Freq: Two times a day (BID) | INTRAMUSCULAR | Status: DC

## 2021-07-25 ENCOUNTER — Telehealth: Payer: Self-pay | Admitting: *Deleted

## 2021-07-25 NOTE — Telephone Encounter (Signed)
Transition Care Management Unsuccessful Follow-up Telephone Call ? ?Date of discharge and from where:  07/25/2021 [place] ? ?Attempts:  1st Attempt ? ?Reason for unsuccessful TCM follow-up call:  Unable to reach patient  Patient Expired.  ? ?  ?

## 2021-08-12 NOTE — Discharge Summary (Signed)
DEATH SUMMARY   Patient Details  Name: [name] MRN: [id number] DOB: 25-Jun-1922 EUM:PNTIR, Rene Kocher, MD  Admission/Discharge Information   Admit Date:  07-25-2021  Date of Death: Date of Death: 2021-08-02  Time of Death: Time of Death: 1250  Length of Stay: 8   Principle Cause of death: Acute perforated appendicitis  Hospital Diagnoses: Principal Problem:   Acute perforated appendicitis Active Problems:   Goals of care, counseling/discussion   PAROXYSMAL ATRIAL FIBRILLATION   Long term (current) use of anticoagulants   Third degree AV block S/p PPM   Chronic diastolic CHF (congestive heart failure) (HCC)   Severe aortic stenosis   History of DVT (deep vein thrombosis)   Left shoulder pain   E. coli UTI   Protein-calorie malnutrition, moderate (HCC)   Iron deficiency anemia   Hypertension   Chronic kidney disease, stage 3a (HCC)   Depression, recurrent (HCC)   Hypothyroidism   Cognitive impairment   Mixed incontinence urge and stress   GERD (gastroesophageal reflux disease)   Slow transit constipation   Uses roller walker   Arthritis   Anemia of chronic disease   Hiatal hernia   Hospital Course: 88 y.o. female with PMH of dementia, diastolic CHF, A-fib/DVT on warfarin, AVB/PPM, CKD-3A, anemia and hypothyroidism presenting with bilateral lower quadrant pain, right> left for about a week that has gotten worse 24 hours prior to presentation, and admitted for acute perforated but contained appendicitis.  General surgery consulted.  Started on IV Zosyn.  Patient's abdominal pain seems to be worsening.  Now with more distention.  TTE with LVEF of 50%, severe aortic valve stenosis and moderate mitral valve stenosis.  Patient is high risk surgery.  General surgery discussed treatment options with patient's daughter who is to discuss with the rest of the family before making decision about pursuing surgery or changing to comfort care.  Palliative care consulted. 3/9,  currently awaiting bed placement at [facility].  Continue comfort care. 3/10 started on morphine infusion due to ongoing severe abdominal pain despite scheduled roxanol. Now anticipating hospital death.   Patient was pronounced deceased at 12:50 PM on Aug 03, 2022.  Assessment and Plan: * Acute perforated appendicitis Presents with abdominal pain for a week. Worse the night before presentation.  Exam was RLQ tenderness.   CT A/P concerning for acute appendicitis with contained microperforation. General surgery consulted -high risk for surgery. Family decided to transition to complete comfort.  Goals of care, counseling/discussion Palliative care consulted. Patient is too high risk for surgery. With ongoing symptom issues, family decided present to complete comfort. Pt overnight with severe pain despite scheduled roxanol.  Now on morphine infusion, will anticipate hospital death.  Chronic diastolic CHF (congestive heart failure) (HCC) Severe aortic stenosis. Moderate MS. Permanent pacemaker implant. Not a surgical candidate for aortic stenosis.  Currently comfort care.  PAROXYSMAL ATRIAL FIBRILLATION Rate controlled without medication.  Patient was on warfarin. Now comfort care.  History of DVT (deep vein thrombosis) Remote but not clear if it is provoked or not Now comfort care.  Left shoulder pain Resolved.   E. coli UTI Urine culture with pansensitive E. coli  She has chronic incontinence. Treated with IV antibiotics.  Now comfort care.  Protein-calorie malnutrition, moderate ([place]) Now comfort care.  Iron deficiency anemia Now comfort care.  Hypertension Blood pressure stable.  Now comfort care.  Chronic kidney disease, stage 3a ([place]) Renal function remained stable while the hospital stay.  Now comfort care.  Depression, recurrent ([place])  Now comfort care.  Hypothyroidism Was on Synthroid.  Now comfort care.  Procedures: none  Consultations: General surgery   Palliative care   The results of significant diagnostics from this hospitalization (including imaging, microbiology, ancillary and laboratory) are listed below for reference.   Significant Diagnostic Studies: CT ABDOMEN PELVIS W CONTRAST  Result Date: 07/29/2021 CLINICAL DATA:  Lower abdominal pain. EXAM: CT ABDOMEN AND PELVIS WITH CONTRAST TECHNIQUE: Multidetector CT imaging of the abdomen and pelvis was performed using the standard protocol following bolus administration of intravenous contrast. RADIATION DOSE REDUCTION: This exam was performed according to the departmental dose-optimization program which includes automated exposure control, adjustment of the mA and/or kV according to patient size and/or use of iterative reconstruction technique. CONTRAST:  132m OMNIPAQUE IOHEXOL 300 MG/ML  SOLN COMPARISON:  09/29/2018 FINDINGS: Lower chest: No acute abnormality. Cardiomegaly. Aortic valve calcifications. Coronary artery calcifications. Scarring and or fibrosis of the included bilateral lung bases. Hepatobiliary: No focal liver abnormality is seen. Status post cholecystectomy. No biliary dilatation. Pancreas: Unremarkable. No pancreatic ductal dilatation or surrounding inflammatory changes. Spleen: Normal in size without significant abnormality. Adrenals/Urinary Tract: Adrenal glands are unremarkable. Kidneys are normal, without renal calculi, solid lesion, or hydronephrosis. Bladder is unremarkable. Stomach/Bowel: Stomach is within normal limits. The appendix is fluid-filled, slightly hyperenhancing, and nondilated, however there is extensive adjacent fat stranding and small locules of extraluminal air (series 2, image 51). No evidence of bowel wall thickening, distention, or inflammatory changes. Vascular/Lymphatic: Aortic atherosclerosis. No enlarged abdominal or pelvic lymph nodes. Reproductive: Status post hysterectomy. Other: No abdominal wall hernia or abnormality. No ascites. Musculoskeletal: No  acute or significant osseous findings. IMPRESSION: 1. The appendix is fluid-filled, slightly hyperenhancing, and nondilated, however there is extensive adjacent fat stranding and small locules of extraluminal air. Findings are consistent with acute appendicitis complicated by micro perforation. 2. Cardiomegaly and coronary artery disease. These results were called by telephone at the time of interpretation on 07/31/2021 at 5:01 pm to PRobbins, who verbally acknowledged these results. Aortic Atherosclerosis (ICD10-I70.0). Electronically Signed   By: ADelanna AhmadiM.D.   On: 07/12/2021 17:04   DG Abd Decub  Result Date: 07/18/2021 CLINICAL DATA:  Abdominal distension, acute perforated appendicitis EXAM: ABDOMEN - 1 VIEW DECUBITUS COMPARISON:  07/18/2021 FINDINGS: Small amount of pneumoperitoneum along the lateral margin of the liver. No bowel dilatation to suggest obstruction. No pathologic calcifications along the expected course of the ureters. No acute osseous abnormality. IMPRESSION: 1. Small amount of pneumoperitoneum along the lateral margin of the liver. In the absence of recent instrumentation or surgery, recommend a follow-up CT of the abdomen. Electronically Signed   By: HKathreen DevoidM.D.   On: 07/18/2021 09:43   DG Abd Portable 1V  Result Date: 07/18/2021 CLINICAL DATA:  Acute appendicitis.  Right lower quadrant pain. EXAM: PORTABLE ABDOMEN - 1 VIEW COMPARISON:  CT abdomen pelvis 07/25/2021. FINDINGS: Bowel gas pattern is unremarkable. Progressive free air is suggested. Decubitus imaging could be used for further evaluation to confirm the free air. IMPRESSION: Progressive free air is suggested. Decubitus imaging could be used for further evaluation to confirm the free air. Critical Value/emergent results were called by telephone at the time of interpretation on 07/18/2021 at 8:47 am [facility], who verbally acknowledged these results. Electronically Signed   By: CSan Morelle M.D.   On: 07/18/2021 09:02   ECHOCARDIOGRAM COMPLETE  Result Date: 07/17/2021    ECHOCARDIOGRAM REPORT   Patient Name:  [name] Date of Exam: 07/17/2021 Medical Rec #:  [id number]          Height:       60.0 in Accession #:    [id number]         Weight:       131.0 lb Date of Birth:  March 21, 1923         BSA:          1.559 m Patient Age:    89 years           BP:           117/60 mmHg Patient Gender: F                  HR:           90 bpm. Exam Location:  Inpatient Procedure: 2D Echo Indications:    Other abnormalities of the heart  History:        Patient has prior history of Echocardiogram examinations, most                 recent 03/09/2015. CHF; Arrythmias:Atrial Fibrillation.  Sonographer:    Jefferey Pica Referring Phys: [id number] TAYE T GONFA IMPRESSIONS  1. Left ventricular ejection fraction, by estimation, is 50%. The left ventricle has mildly decreased function. The left ventricle demonstrates global hypokinesis. There is moderate concentric left ventricular hypertrophy. Left ventricular diastolic parameters are consistent with Grade I diastolic dysfunction (impaired relaxation).  2. Right ventricular systolic function is normal. The right ventricular size is normal. There is moderately elevated pulmonary artery systolic pressure. The estimated right ventricular systolic pressure is 89.2 mmHg.  3. The mitral valve is degenerative. Mild mitral valve regurgitation. Moderate mitral stenosis. The mean mitral valve gradient is 7.0 mmHg. Moderate mitral annular and valvular calcification with restricted motion. MVA 1.43 cm^2 by VTI.  4. The aortic valve is tricuspid. There is severe calcifcation of the aortic valve. Aortic valve regurgitation is trivial. Severe aortic valve stenosis. Aortic valve area, by VTI measures 0.55 cm. Aortic valve mean gradient measures 42.0 mmHg.  5. Left atrial size was mildly dilated.  6. The inferior vena cava is normal in size with <50% respiratory variability,  suggesting right atrial pressure of 8 mmHg. FINDINGS  Left Ventricle: Left ventricular ejection fraction, by estimation, is 50%. The left ventricle has mildly decreased function. The left ventricle demonstrates global hypokinesis. The left ventricular internal cavity size was normal in size. There is moderate concentric left ventricular hypertrophy. Left ventricular diastolic parameters are consistent with Grade I diastolic dysfunction (impaired relaxation). Right Ventricle: The right ventricular size is normal. No increase in right ventricular wall thickness. Right ventricular systolic function is normal. There is moderately elevated pulmonary artery systolic pressure. The tricuspid regurgitant velocity is 3.30 m/s, and with an assumed right atrial pressure of 8 mmHg, the estimated right ventricular systolic pressure is 11.9 mmHg. Left Atrium: Left atrial size was mildly dilated. Right Atrium: Right atrial size was normal in size. Pericardium: Trivial pericardial effusion is present. Mitral Valve: The mitral valve is degenerative in appearance. There is moderate calcification of the mitral valve leaflet(s). Moderate mitral annular calcification. Mild mitral valve regurgitation. Moderate mitral valve stenosis. MV peak gradient, 15.6 mmHg. The mean mitral valve gradient is 7.0 mmHg. Tricuspid Valve: The tricuspid valve is normal in structure. Tricuspid valve regurgitation is mild. Aortic Valve: The aortic valve is tricuspid. There is severe calcifcation of the aortic valve. Aortic valve regurgitation is trivial. Severe aortic  stenosis is present. Aortic valve mean gradient measures 42.0 mmHg. Aortic valve peak gradient measures 62.7 mmHg. Aortic valve area, by VTI measures 0.55 cm. Pulmonic Valve: The pulmonic valve was normal in structure. Pulmonic valve regurgitation is trivial. Aorta: The aortic root is normal in size and structure. Venous: The inferior vena cava is normal in size with less than 50% respiratory  variability, suggesting right atrial pressure of 8 mmHg. IAS/Shunts: No atrial level shunt detected by color flow Doppler. Additional Comments: A device lead is visualized in the right ventricle.  LEFT VENTRICLE PLAX 2D LVIDd:         4.10 cm   Diastology LVIDs:         2.80 cm   LV e' medial:    3.44 cm/s LV PW:         1.30 cm   LV E/e' medial:  33.1 LV IVS:        1.30 cm   LV e' lateral:   5.12 cm/s LVOT diam:     1.80 cm   LV E/e' lateral: 22.3 LV SV:         49 LV SV Index:   31 LVOT Area:     2.54 cm  RIGHT VENTRICLE             IVC RV S prime:     10.90 cm/s  IVC diam: 1.90 cm TAPSE (M-mode): 1.7 cm LEFT ATRIUM             Index        RIGHT ATRIUM           Index LA diam:        3.80 cm 2.44 cm/m   RA Area:     11.90 cm LA Vol (A2C):   56.8 ml 36.42 ml/m  RA Volume:   27.90 ml  17.89 ml/m LA Vol (A4C):   38.1 ml 24.43 ml/m LA Biplane Vol: 47.6 ml 30.52 ml/m  AORTIC VALVE                     PULMONIC VALVE AV Area (Vmax):    0.61 cm      PV Vmax:       0.87 m/s AV Area (Vmean):   0.51 cm      PV Peak grad:  3.1 mmHg AV Area (VTI):     0.55 cm AV Vmax:           396.00 cm/s AV Vmean:          284.200 cm/s AV VTI:            0.895 m AV Peak Grad:      62.7 mmHg AV Mean Grad:      42.0 mmHg LVOT Vmax:         95.00 cm/s LVOT Vmean:        56.700 cm/s LVOT VTI:          0.193 m LVOT/AV VTI ratio: 0.22  AORTA Ao Root diam: 2.90 cm Ao Asc diam:  3.20 cm MITRAL VALVE                TRICUSPID VALVE MV Area (PHT): 4.04 cm     TR Peak grad:   43.6 mmHg MV Area VTI:   1.28 cm     TR Vmax:        330.00 cm/s MV Peak grad:  15.6 mmHg MV Mean grad:  7.0 mmHg  SHUNTS MV Vmax:       1.98 m/s     Systemic VTI:  0.19 m MV Vmean:      122.5 cm/s   Systemic Diam: 1.80 cm MV Decel Time: 188 msec MR Peak grad: 142.6 mmHg MR Vmax:      597.00 cm/s MV E velocity: 114.00 cm/s MV A velocity: 161.00 cm/s MV E/A ratio:  0.71 Dalton McleanMD Electronically signed by Franki Monte Signature Date/Time: 07/17/2021/4:55:02 PM     Final     Microbiology: Recent Results (from the past 240 hour(s))  Resp Panel by RT-PCR (Flu A&B, Covid) Nasopharyngeal Swab     Status: None   Collection Time: 07/30/2021  3:26 PM   Specimen: Nasopharyngeal Swab; Nasopharyngeal(NP) swabs in vial transport medium  Result Value Ref Range Status   SARS Coronavirus 2 by RT PCR NEGATIVE NEGATIVE Final    Comment: (NOTE) SARS-CoV-2 target nucleic acids are NOT DETECTED.  The SARS-CoV-2 RNA is generally detectable in upper respiratory specimens during the acute phase of infection. The lowest concentration of SARS-CoV-2 viral copies this assay can detect is 138 copies/mL. A negative result does not preclude SARS-Cov-2 infection and should not be used as the sole basis for treatment or other patient management decisions. A negative result may occur with  improper specimen collection/handling, submission of specimen other than nasopharyngeal swab, presence of viral mutation(s) within the areas targeted by this assay, and inadequate number of viral copies(<138 copies/mL). A negative result must be combined with clinical observations, patient history, and epidemiological information. The expected result is Negative.  Fact Sheet for Patients:  EntrepreneurPulse.com.au  Fact Sheet for Healthcare Providers:  IncredibleEmployment.be  This test is no t yet approved or cleared by the [place] FDA and  has been authorized for detection and/or diagnosis of SARS-CoV-2 by FDA under an Emergency Use Authorization (EUA). This EUA will remain  in effect (meaning this test can be used) for the duration of the COVID-19 declaration under Section 564(b)(1) of the Act, 21 U.S.C.section 360bbb-3(b)(1), unless the authorization is terminated  or revoked sooner.       Influenza A by PCR NEGATIVE NEGATIVE Final   Influenza B by PCR NEGATIVE NEGATIVE Final    Comment: (NOTE) The Xpert Xpress SARS-CoV-2/FLU/RSV plus  assay is intended as an aid in the diagnosis of influenza from Nasopharyngeal swab specimens and should not be used as a sole basis for treatment. Nasal washings and aspirates are unacceptable for Xpert Xpress SARS-CoV-2/FLU/RSV testing.  Fact Sheet for Patients: EntrepreneurPulse.com.au  Fact Sheet for Healthcare Providers: IncredibleEmployment.be  This test is not yet approved or cleared by the [place] FDA and has been authorized for detection and/or diagnosis of SARS-CoV-2 by FDA under an Emergency Use Authorization (EUA). This EUA will remain in effect (meaning this test can be used) for the duration of the COVID-19 declaration under Section 564(b)(1) of the Act, 21 U.S.C. section 360bbb-3(b)(1), unless the authorization is terminated or revoked.  Performed at [facility], [place] [address], [place], [place] [address]   Urine Culture     Status: Abnormal   Collection Time: 08/02/2021  4:13 PM   Specimen: Urine, Clean Catch  Result Value Ref Range Status   Specimen Description   Final    URINE, CLEAN CATCH Performed at [facility], [place] [address], [place], [place] [address]    Special Requests   Final    NONE Performed at [facility], [address]  [place], [place] [address]    Culture 60,000 COLONIES/mL ESCHERICHIA COLI (A)  Final   Report Status 07/18/2021 FINAL  Final   Organism ID, Bacteria ESCHERICHIA COLI (A)  Final      Susceptibility   Escherichia coli - MIC*    AMPICILLIN <=2 SENSITIVE Sensitive     CEFAZOLIN <=4 SENSITIVE Sensitive     CEFEPIME <=0.12 SENSITIVE Sensitive     CEFTRIAXONE <=0.25 SENSITIVE Sensitive     CIPROFLOXACIN <=0.25 SENSITIVE Sensitive     GENTAMICIN <=1 SENSITIVE Sensitive     IMIPENEM <=0.25 SENSITIVE Sensitive     NITROFURANTOIN <=16 SENSITIVE Sensitive     TRIMETH/SULFA <=20 SENSITIVE Sensitive     AMPICILLIN/SULBACTAM <=2 SENSITIVE  Sensitive     PIP/TAZO <=4 SENSITIVE Sensitive     * 60,000 COLONIES/mL ESCHERICHIA COLI    Time spent: 35 minutes  Signed: Berle Mull, MD 16-Aug-2021

## 2021-08-12 NOTE — Progress Notes (Signed)
Called to patients room by daughter,patient without respirations and heart rate, confirmed by 2 RN, Dr. Posey Pronto notified ?

## 2021-08-12 DEATH — deceased

## 2022-04-23 IMAGING — CT CT ABD-PELV W/ CM
2 of 5 series · 16 of 46 positions shown, 18 images · IV contrast (OMNIPAQUE 300)
Comparison: 09/29/2018

CLINICAL DATA: Lower abdominal pain.

EXAM:
CT ABDOMEN AND PELVIS WITH CONTRAST
TECHNIQUE: Multidetector CT imaging of the abdomen and pelvis was performed
using the standard protocol following bolus administration of
intravenous contrast.

[Series 2: axial st · axial · 0.71mm/px · z∈[-820,-460]mm · 13 of 84 slices shown, 15 images]
[im 6/84  soft-tissue]
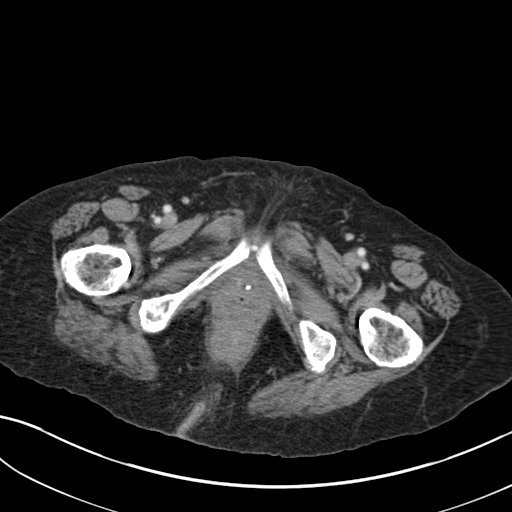
[im 6/84  bone]
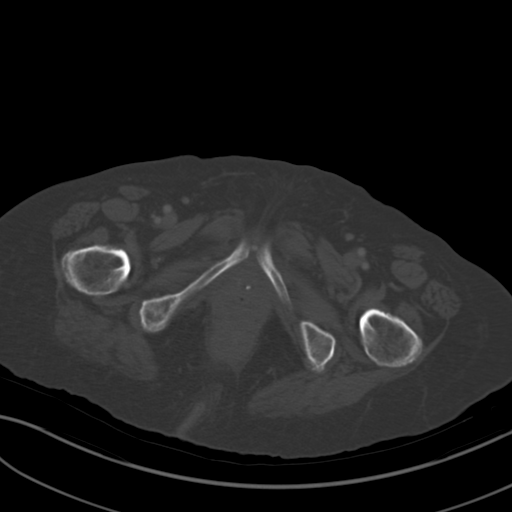
[im 12/84  soft-tissue]
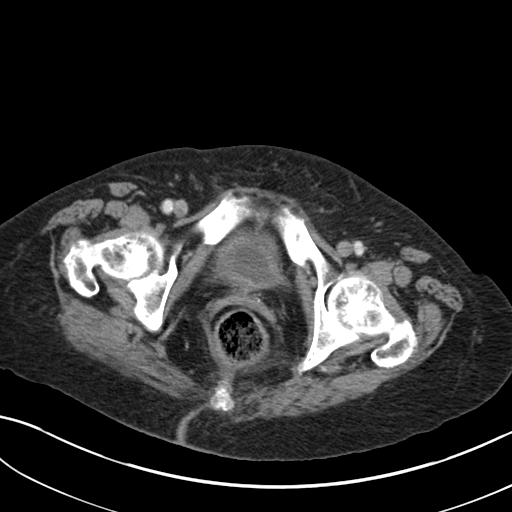
[im 18/84  soft-tissue]
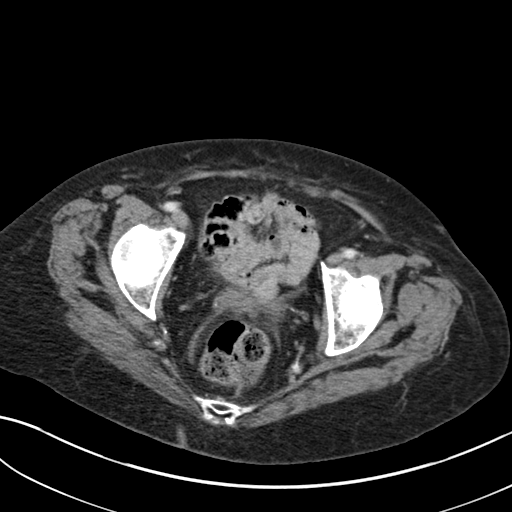
[im 24/84  soft-tissue]
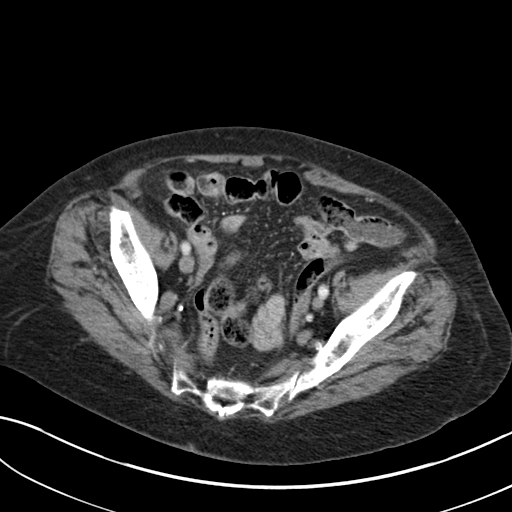
[im 30/84  soft-tissue]
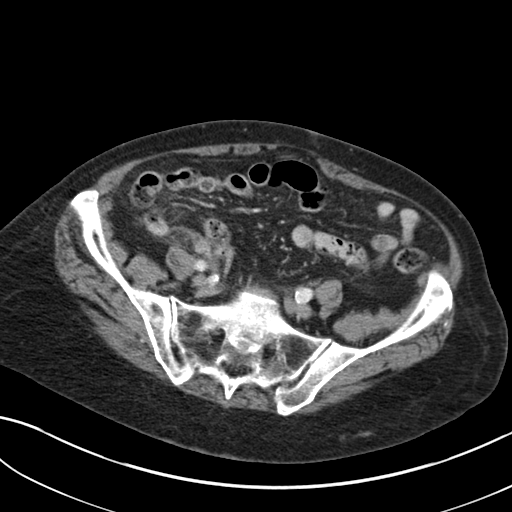
[im 36/84  soft-tissue]
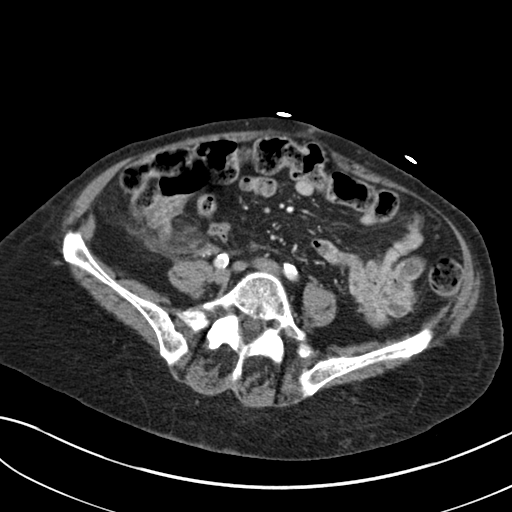
[im 42/84  soft-tissue]
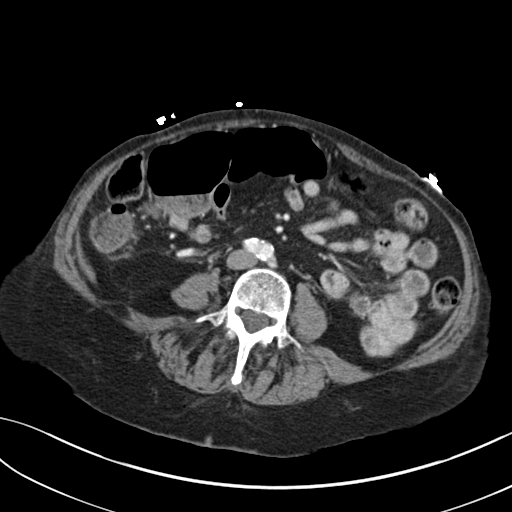
[im 48/84  soft-tissue]
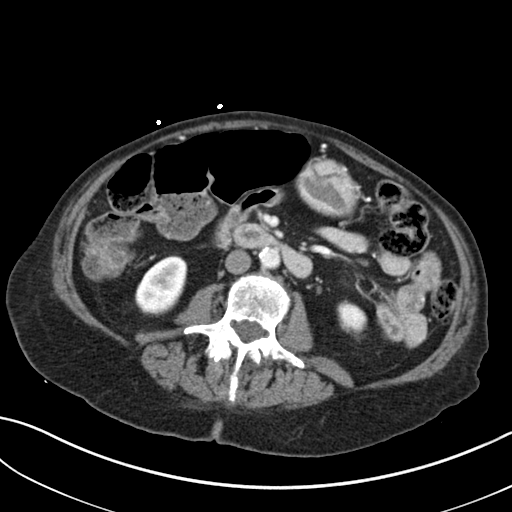
[im 54/84  soft-tissue]
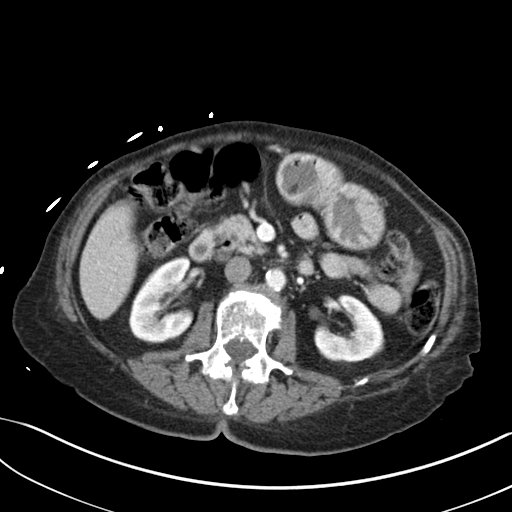
[im 54/84  bone]
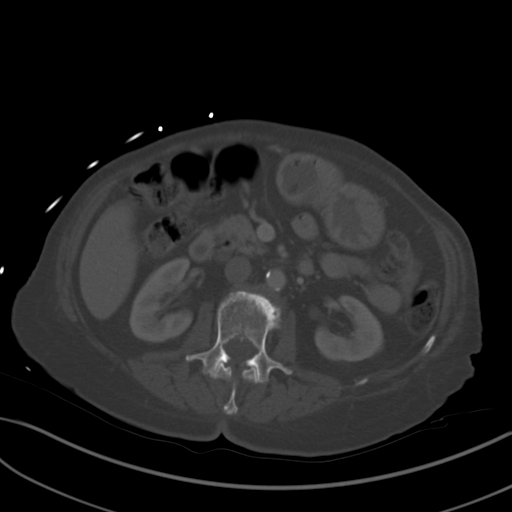
[im 60/84  soft-tissue]
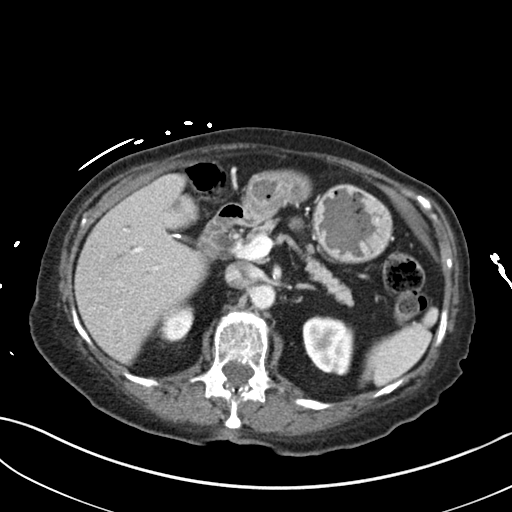
[im 66/84  soft-tissue]
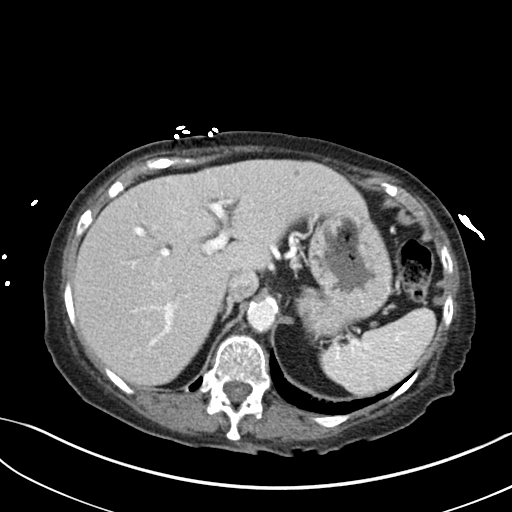
[im 72/84  soft-tissue]
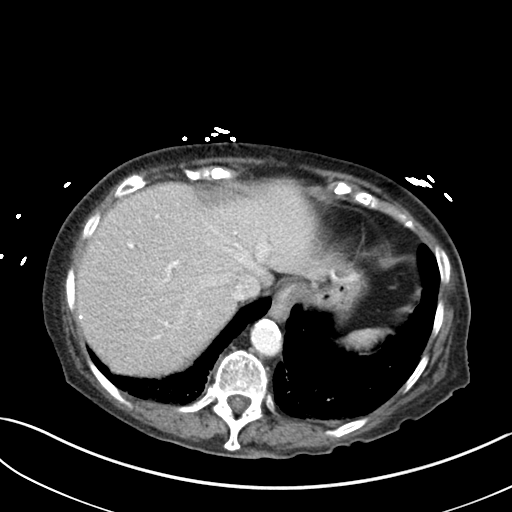
[im 78/84  soft-tissue]
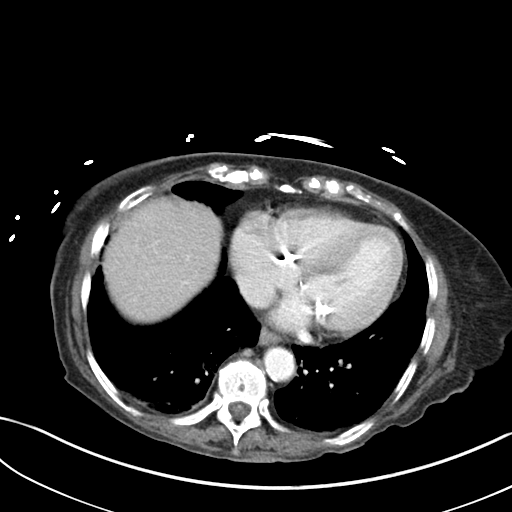

[Series 4: coronal st · coronal · 0.73mm/px · 3 of 131 slices shown]
[im 44/131  soft-tissue]
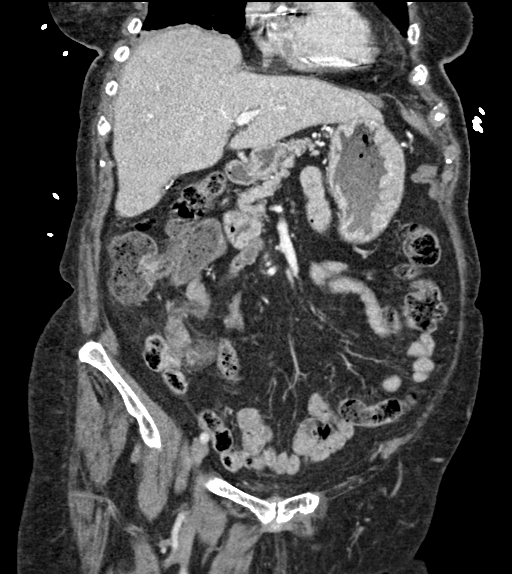
[im 58/131  soft-tissue]
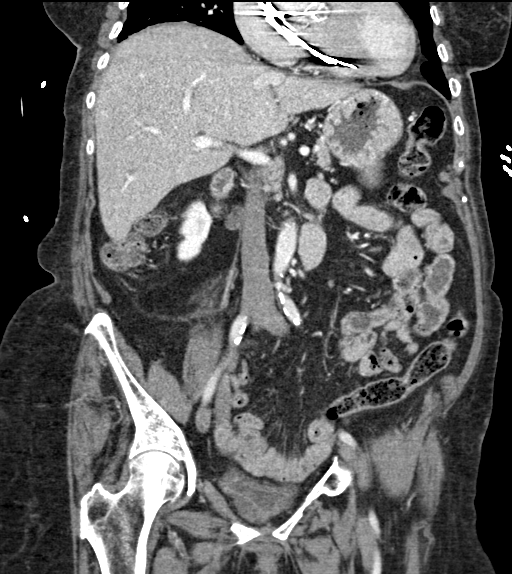
[im 73/131  soft-tissue]
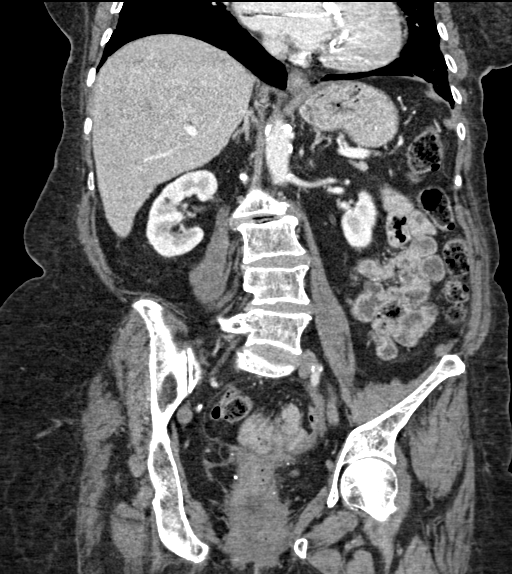

[16 of 46 positions shown; findings below may reference images not displayed]

RADIATION DOSE REDUCTION: This exam was performed according to the
departmental dose-optimization program which includes automated
exposure control, adjustment of the mA and/or kV according to
patient size and/or use of iterative reconstruction technique.

CONTRAST:  100mL OMNIPAQUE IOHEXOL 300 MG/ML  SOLN
FINDINGS: Lower chest: No acute abnormality. Cardiomegaly. Aortic valve
calcifications. Coronary artery calcifications. Scarring and or
fibrosis of the included bilateral lung bases.

Hepatobiliary: No focal liver abnormality is seen. Status post
cholecystectomy. No biliary dilatation.

Pancreas: Unremarkable. No pancreatic ductal dilatation or
surrounding inflammatory changes.

Spleen: Normal in size without significant abnormality.

Adrenals/Urinary Tract: Adrenal glands are unremarkable. Kidneys are
normal, without renal calculi, solid lesion, or hydronephrosis.
Bladder is unremarkable.

Stomach/Bowel: Stomach is within normal limits. The appendix is
fluid-filled, slightly hyperenhancing, and nondilated, however there
is extensive adjacent fat stranding and small locules of
extraluminal air (series 2, image 51). No evidence of bowel wall
thickening, distention, or inflammatory changes.

Vascular/Lymphatic: Aortic atherosclerosis. No enlarged abdominal or
pelvic lymph nodes.

Reproductive: Status post hysterectomy.

Other: No abdominal wall hernia or abnormality. No ascites.

Musculoskeletal: No acute or significant osseous findings.
IMPRESSION: 1. The appendix is fluid-filled, slightly hyperenhancing, and
nondilated, however there is extensive adjacent fat stranding and
small locules of extraluminal air. Findings are consistent with
acute appendicitis complicated by micro perforation.
2. Cardiomegaly and coronary artery disease.

These results were called by telephone at the time of interpretation
on 07/16/2021 at [DATE] to

PA MELVIN YOVANI FOLGAR , who verbally acknowledged these results.

Aortic Atherosclerosis ([id number]).
